# Patient Record
Sex: Male | Born: 1940
Health system: Southern US, Community
[De-identification: ages and names within clinical notes are randomized; demographics above are authoritative.]

## PROBLEM LIST (undated history)

## (undated) DIAGNOSIS — H269 Unspecified cataract: Secondary | ICD-10-CM

## (undated) DIAGNOSIS — F32A Depression, unspecified: Secondary | ICD-10-CM

## (undated) DIAGNOSIS — H409 Unspecified glaucoma: Secondary | ICD-10-CM

## (undated) DIAGNOSIS — K219 Gastro-esophageal reflux disease without esophagitis: Secondary | ICD-10-CM

## (undated) DIAGNOSIS — I10 Essential (primary) hypertension: Secondary | ICD-10-CM

## (undated) DIAGNOSIS — E785 Hyperlipidemia, unspecified: Secondary | ICD-10-CM

## (undated) DIAGNOSIS — E039 Hypothyroidism, unspecified: Secondary | ICD-10-CM

## (undated) DIAGNOSIS — C679 Malignant neoplasm of bladder, unspecified: Secondary | ICD-10-CM

## (undated) DIAGNOSIS — N400 Enlarged prostate without lower urinary tract symptoms: Secondary | ICD-10-CM

## (undated) DIAGNOSIS — G43909 Migraine, unspecified, not intractable, without status migrainosus: Secondary | ICD-10-CM

## (undated) DIAGNOSIS — L989 Disorder of the skin and subcutaneous tissue, unspecified: Secondary | ICD-10-CM

## (undated) DIAGNOSIS — K08109 Complete loss of teeth, unspecified cause, unspecified class: Secondary | ICD-10-CM

## (undated) DIAGNOSIS — E119 Type 2 diabetes mellitus without complications: Secondary | ICD-10-CM

## (undated) DIAGNOSIS — R0609 Other forms of dyspnea: Secondary | ICD-10-CM

## (undated) DIAGNOSIS — H9190 Unspecified hearing loss, unspecified ear: Secondary | ICD-10-CM

## (undated) DIAGNOSIS — R0600 Dyspnea, unspecified: Secondary | ICD-10-CM

## (undated) DIAGNOSIS — Z972 Presence of dental prosthetic device (complete) (partial): Secondary | ICD-10-CM

## (undated) DIAGNOSIS — G629 Polyneuropathy, unspecified: Secondary | ICD-10-CM

## (undated) DIAGNOSIS — H5440 Blindness, one eye, unspecified eye: Secondary | ICD-10-CM

## (undated) DIAGNOSIS — Z973 Presence of spectacles and contact lenses: Secondary | ICD-10-CM

## (undated) DIAGNOSIS — R399 Unspecified symptoms and signs involving the genitourinary system: Secondary | ICD-10-CM

## (undated) HISTORY — PX: EYE SURGERY: SHX253

## (undated) HISTORY — PX: GLAUCOMA SURGERY: SHX656

## (undated) HISTORY — PX: OTHER SURGICAL HISTORY: SHX169

## (undated) HISTORY — DX: Hyperlipidemia, unspecified: E78.5

## (undated) HISTORY — PX: BACK SURGERY: SHX140

## (undated) HISTORY — PX: CHOLECYSTECTOMY: SHX55

---

## 1997-11-10 ENCOUNTER — Ambulatory Visit (HOSPITAL_COMMUNITY): Admission: RE | Admit: 1997-11-10 | Discharge: 1997-11-10 | Payer: Self-pay | Admitting: Neurosurgery

## 1997-11-24 ENCOUNTER — Ambulatory Visit (HOSPITAL_COMMUNITY): Admission: RE | Admit: 1997-11-24 | Discharge: 1997-11-24 | Payer: Self-pay | Admitting: Neurosurgery

## 1997-12-08 ENCOUNTER — Ambulatory Visit (HOSPITAL_COMMUNITY): Admission: RE | Admit: 1997-12-08 | Discharge: 1997-12-08 | Payer: Self-pay | Admitting: Neurosurgery

## 1998-02-27 ENCOUNTER — Ambulatory Visit (HOSPITAL_COMMUNITY): Admission: RE | Admit: 1998-02-27 | Discharge: 1998-02-27 | Payer: Self-pay | Admitting: Neurosurgery

## 2000-06-14 ENCOUNTER — Ambulatory Visit (HOSPITAL_COMMUNITY): Admission: RE | Admit: 2000-06-14 | Discharge: 2000-06-14 | Payer: Self-pay | Admitting: Neurosurgery

## 2000-06-14 ENCOUNTER — Encounter: Payer: Self-pay | Admitting: Neurosurgery

## 2000-08-15 ENCOUNTER — Observation Stay (HOSPITAL_COMMUNITY): Admission: RE | Admit: 2000-08-15 | Discharge: 2000-08-15 | Payer: Self-pay | Admitting: Neurosurgery

## 2000-08-15 ENCOUNTER — Encounter: Payer: Self-pay | Admitting: Neurosurgery

## 2000-08-15 HISTORY — PX: ANTERIOR CERVICAL DECOMP/DISCECTOMY FUSION: SHX1161

## 2000-09-16 ENCOUNTER — Encounter: Payer: Self-pay | Admitting: Neurosurgery

## 2000-09-16 ENCOUNTER — Encounter: Admission: RE | Admit: 2000-09-16 | Discharge: 2000-09-16 | Payer: Self-pay | Admitting: Neurosurgery

## 2000-10-28 ENCOUNTER — Encounter: Admission: RE | Admit: 2000-10-28 | Discharge: 2000-10-28 | Payer: Self-pay | Admitting: Neurosurgery

## 2000-10-28 ENCOUNTER — Encounter: Payer: Self-pay | Admitting: Neurosurgery

## 2003-07-19 ENCOUNTER — Emergency Department (HOSPITAL_COMMUNITY): Admission: AD | Admit: 2003-07-19 | Discharge: 2003-07-19 | Payer: Self-pay | Admitting: Family Medicine

## 2008-02-08 ENCOUNTER — Encounter: Admission: RE | Admit: 2008-02-08 | Discharge: 2008-02-08 | Payer: Self-pay | Admitting: Otolaryngology

## 2008-03-04 ENCOUNTER — Ambulatory Visit (HOSPITAL_COMMUNITY): Admission: RE | Admit: 2008-03-04 | Discharge: 2008-03-05 | Payer: Self-pay | Admitting: Neurosurgery

## 2008-03-04 HISTORY — PX: ANTERIOR CERVICAL DECOMP/DISCECTOMY FUSION: SHX1161

## 2010-12-25 NOTE — Op Note (Signed)
Keith [name], Keith [name]              ACCOUNT NO.:  [id number]   MEDICAL RECORD NO.:  [id number]          PATIENT TYPE:  AMB   LOCATION:  SDS                          FACILITY:  [facility]   PHYSICIAN:  Coletta Memos, M.D.     DATE OF BIRTH:  05/01/1941   DATE OF PROCEDURE:  03/04/2008  DATE OF DISCHARGE:                               OPERATIVE REPORT   PREOPERATIVE DIAGNOSIS:  Displaced disk, left C6-C7.   POSTOPERATIVE DIAGNOSIS:  Displaced disk, left C6-C7.   PROCEDURES:  1. Anterior cervical decompression, C6-C7.  2. Arthrodesis, C6-C7 with 7-mm ACF bone.  3. Anterior instrumentation, C6-C7, 20-mm Vector plate with 16-XW      screws.  4. Removal of tethered plate, broken screw left in the C6 bone covered      over by the new plate.   COMPLICATIONS:  None.   SURGEON:  Coletta Memos, MD   ASSISTANT:  Hilda Lias, MD   ANESTHESIA:  General endotracheal.   INDICATIONS:  Keith [name] is a long-time patient of mine having had his  first operation of the C5-C6 for herniated disk in 2002.  I had followed  him until about 2 years ago, again he is still being doing well.  He  came back in with acute history of pain in his neck and left upper  extremity.  MRI done this week showed a herniated disk on the left side  at C6-C7.  I, therefore, recommended and he agreed to undergo operative  decompression at C6-C7.   OPERATIVE NOTE:  Keith [name] was brought to the operating room,  intubated, and placed under general anesthetic.  His head was placed on  a horseshoe headrest in slight extension.  His neck was prepped and he  was draped in a sterile fashion.  I opened his old incision after  infiltrating 0.5% lidocaine.  I did this with a #10 blade and took this  initial cut down to the platysma.  I opened the platysma in a horizontal  fashion using Metzenbaum scissors.  I was able to dissect cleanly to the  anterior cervical fascia.  The esophagus was not particularly padded.  I  was able  to appreciate the old plate.  I then used curettes and  monopolar cautery to remove soft tissues from around the plate.  I then  removed the plate and the screws.  The bottom right screw at C6 was  broken.  Looking back at the MRI, it appeared that, that screw may have  been broken already.  Nevertheless, I was then able to again access of  the disk space.  Some of the bone had overgrown in the disk space, but I  was able to get underneath that with a Kerrison punch and remove that.   I prepared for the diskectomy first by using a #10 blade and opening the  disk space.  I then used curettes, Kerrison punches, pituitary rongeurs,  and a high-speed drill to fully open the disk space and removed the disk  material and soft tissue.  I removed over a large piece of the disk from  the left side at C6-C7.  I was able to then fully decompress the spinal  canal and the C7 nerve roots at that level.  Having done that, I then  prepared for arthrodesis.  I evened out the C6 and C7 vertebral bodies  to accept the bone graft.  I sized the space and felt that a 7-mm graft  would be a best fit.  I placed a 7-mm graft without difficulty.  Bleeding was controlled with ease.  Bleeding was controlled with  irrigation and Gelfoam.  I was then able to place a 20-mm plate.  This  was done so that it would cover the hole where the broken screw was.  It  was well within the bone and a solid fusion had been obtained at C5-C6.  Four screws were placed into the C5-C6 complex and two into C7.  X-rays  were taken which showed that the plate was not at C5 and it was obvious  that I had been at the correct the position.  I then irrigated.  Again  with Dr. Cassandria Santee assistance as I had with placing the plate, closed the  wound in layered fashion using Vicryl sutures to reapproximate the  platysma and subcuticular layers.  I used Dermabond formal sterile  dressing.  He tolerated the procedure well and moving all  extremities.           ______________________________  Coletta Memos, M.D.     KC/MEDQ  D:  03/04/2008  T:  03/05/2008  Job:  [id number]

## 2010-12-28 NOTE — H&P (Signed)
[place]  Patient:    Keith [name], Keith [name]                   MRN: [id number] Adm. Date:  08/15/00 Attending:  Ronaldo Miyamoto L. Franky Macho, M.D.                         History and Physical  ADMISSION DIAGNOSES:  C5-6 displaced disk, with bilateral C6 radiculopathy and spondylosis of the cervical spine.  HISTORY:  The patient initially presented to my office in March 1999, for the evaluation of low back pain.  He had improved until October 2001, when he first noted some pain in his left shoulder and in his neck.  The pain was described as a burning sensation at the base of his neck.  He had some pain which radiated into the left shoulder.  If he flexes his arm and externally rotates it, putting his hand behind his head, then the pain will decrease.  I felt that was typical of his displaced disk in the cervical spine, so I sent him for plain x-rays and an MRI of the cervical spine.  The MRI showed that he had a displaced disk on the right side at C5-6 and bilateral foraminal stenosis, along with a very spondylitic level seen on plain films.  We did try conservative therapy, and he never had neurologic deficits, but the pain did not improve, and in December he and I decided that he should go ahead with an anterior cervical diskectomy and arthrodesis.  PAST MEDICAL HISTORY: 1. Glaucoma. 2. He had one previous operation on his eye in 1980, for glaucoma.  ALLERGIES:  No known drug allergies.  SOCIAL HISTORY:  He smokes one pack of cigarettes a day with a 40-pack-year-history.  He is 5 feet 10 inches tall, weighing 180 pounds.  CURRENT MEDICATIONS: 1. Synthroid 100 mcg q.d. 2. Vioxx 25 mg q.d. 3. Occasional hydrocodone. 4. Serevent inhaler p.r.n.  FAMILY HISTORY:  Mother and father are both deceased.  There are no medical problems in the family.  PHYSICAL EXAMINATION:  GENERAL:  He is alert and oriented by 4, and answering all  questions appropriately.  NEUROLOGIC:  Cranial nerves were normal.  Memory, language, and attention span were normal.  He has 5/5 strength in the upper and lower extremities.  Normal muscle tone, bulk, and coordination.  Proprioception normal.  Pinprick normal. No Hoffmans sign.  No clonus.  NECK:  He had no cervical masses or bruits.  HEART:  Regular rhythm and rate.  ABDOMEN:  Soft, nontender.  Bowel sounds present.  No masses.  EXTREMITIES:  Pulses good at the wrists and feet bilaterally.  An MRI shows a displaced disk at C5-6, with spurring both anteriorly and posteriorly.  It is also seen on the plain films.  Neural foraminal stenosis evident on the plain films.  The patient has pain in the neck and left upper extremity, secondary to bilateral foraminal stenosis and one bad degenerated level at C5-6.  I have recommended, and he has agreed to undergo an anterior cervical diskectomy and fusion.  The risks of the procedure, including bleeding, infection, no pain relief, bowel and bladder dysfunction, worsening of the pain, paralysis, upper and/or lower extremity weakness, difficulty with urination and/or bowel movements, need for further operation, fusion failure, and hardware failure were explained.  He wishes to proceed. DD:  08/15/00 TD:  08/15/00 Job: [id number]

## 2010-12-28 NOTE — Op Note (Signed)
[place]  Patient:    Keith [name], Keith [name]                   MRN: [id number] Proc. Date: 08/15/00 Attending:  Ronaldo Miyamoto L. Franky Macho, M.D.                           Operative Report  PREOPERATIVE DIAGNOSIS:  Displaced disk C5-6.  POSTOPERATIVE DIAGNOSIS:  Displaced disk C5-6.  OPERATION PERFORMED:  Anterior cervical diskectomy and arthrodesis C5-6 with 18 mm tethered plate, 8 mm allograft and four 13 mm variable angled screws.  COMPLICATIONS:  None.  SURGEON:  Kyle L. Franky Macho, M.D.  ASSISTANT:  Hewitt Shorts, M.D.  ANESTHESIA:  General endotracheal.  INDICATIONS FOR PROCEDURE:  The patient is a gentleman whom I have followed for a long period of time for low back pain.  He was doing well until October of 2001 when at that time he started complaining of neck and left shoulder pain.  MRI showed a disk herniation at C5-6 with both spondylitic spurs and disk.  I recommended and he agreed to undergo an anterior cervical diskectomy and fusion.  DESCRIPTION OF PROCEDURE:  The patient was brought to the operating room, intubated and placed under general anesthesia without difficulty.  His neck was prepped and I infiltrated 0.5% lidocaine 1:200,000 strength epinephrine into my proposed incision site.  He was draped in sterile fashion.  I made a skin incision starting from the midline of the neck to the medial border of the left sternocleidomastoid with a #10 blade and took this down to the platysma.  I controlled bleeding in the subcutaneous with bipolar cautery.  I opened the platysma in a horizontal fashion with Metzenbaum scissors.  I dissected both rostrally and caudally, superior and inferior to the platysma. I was able then to gain the crease between the sternocleidomastoid and the medial strap muscles.  Using a hand held retractor, the medial structures were retracted medially.  I developed a plane using a Kitner between the two to the  anterior cervical spine, removed soft tissue from the front and thus placed a spinal needle for localization.  He had a large spur anteriorly and it felt that this would be the correct level and it was as seen on x-ray.  I opened the disk space with a #15 blade to mark it and removed a small amount of disk material.  I then reflected the longus colli muscles laterally and placed a self-retaining Caspar retractor.  I placed two distraction pins, one in C5, the other in C6.  I then opened the disk space and then distracted the disk space.  Using a 15 blade and using straight curets, I removed much of the soft tissue and the end plate at C5 and 6.  I was able to remove enough of the soft tissue to get the Kerrison punch underneath the posterior longitudinal ligament.  The microscope was then brought into the operative site and I used that to further remove bone and disk.  I used a drill also to remove some bone spurs and shave down the spondylosis which was encroaching upon the neural foramina.  Using the Kerrison punches I was able to decompress both of the C6 nerve roots on the left and right side to my satisfaction.  There was no pressure appreciated on either nerve root.  I irrigated the wound.  Placed some Gelfoam for controlling some of  the epidural bleeding.  I then placed an 8 mm graft into the disk space after drilling down the end plates and smoothing them out and taking off some more spurs with a Leksell.  I placed a 18 mm tethered plate and used four 13 mm variable angle screws in all.  Two screws placed at C5, two screws placed at C6.  I irrigated the wound well. The weight was taken off the neck.  I took another x-ray showed the plate to be in good position as was the bone plug.  I then closed the wound in layered fashion reapproximating the platysma and then the subcutaneous tissues. Steri-Strips were used on the skin and a sterile dressing was applied.  The patient was extubated  moving all extremities well. DD:  08/15/00 TD:  08/15/00 Job: [id number]

## 2011-02-18 ENCOUNTER — Encounter (HOSPITAL_COMMUNITY): Payer: Self-pay

## 2011-02-18 ENCOUNTER — Emergency Department (HOSPITAL_COMMUNITY): Payer: Medicare Other

## 2011-02-18 ENCOUNTER — Emergency Department (HOSPITAL_COMMUNITY)
Admission: EM | Admit: 2011-02-18 | Discharge: 2011-02-18 | Disposition: A | Discharge status: Home / Self Care | Payer: Medicare Other | Attending: Emergency Medicine | Admitting: Emergency Medicine

## 2011-02-18 DIAGNOSIS — E7800 Pure hypercholesterolemia, unspecified: Secondary | ICD-10-CM | POA: Insufficient documentation

## 2011-02-18 DIAGNOSIS — R509 Fever, unspecified: Secondary | ICD-10-CM | POA: Insufficient documentation

## 2011-02-18 DIAGNOSIS — E039 Hypothyroidism, unspecified: Secondary | ICD-10-CM | POA: Insufficient documentation

## 2011-02-18 DIAGNOSIS — J189 Pneumonia, unspecified organism: Secondary | ICD-10-CM | POA: Insufficient documentation

## 2011-05-10 LAB — CBC
HCT: 40.9
MCV: 93.8
RBC: 4.37
WBC: 6.5

## 2011-09-17 DIAGNOSIS — E0789 Other specified disorders of thyroid: Secondary | ICD-10-CM | POA: Diagnosis not present

## 2011-09-17 DIAGNOSIS — E785 Hyperlipidemia, unspecified: Secondary | ICD-10-CM | POA: Diagnosis not present

## 2011-09-17 DIAGNOSIS — Z125 Encounter for screening for malignant neoplasm of prostate: Secondary | ICD-10-CM | POA: Diagnosis not present

## 2011-09-17 DIAGNOSIS — Z79899 Other long term (current) drug therapy: Secondary | ICD-10-CM | POA: Diagnosis not present

## 2011-09-24 DIAGNOSIS — N400 Enlarged prostate without lower urinary tract symptoms: Secondary | ICD-10-CM | POA: Diagnosis not present

## 2011-09-24 DIAGNOSIS — E789 Disorder of lipoprotein metabolism, unspecified: Secondary | ICD-10-CM | POA: Diagnosis not present

## 2011-09-24 DIAGNOSIS — M169 Osteoarthritis of hip, unspecified: Secondary | ICD-10-CM | POA: Diagnosis not present

## 2011-09-24 DIAGNOSIS — F17200 Nicotine dependence, unspecified, uncomplicated: Secondary | ICD-10-CM | POA: Diagnosis not present

## 2011-09-24 DIAGNOSIS — M25559 Pain in unspecified hip: Secondary | ICD-10-CM | POA: Diagnosis not present

## 2011-09-24 DIAGNOSIS — E0789 Other specified disorders of thyroid: Secondary | ICD-10-CM | POA: Diagnosis not present

## 2011-09-26 DIAGNOSIS — Z0000 Encounter for general adult medical examination without abnormal findings: Secondary | ICD-10-CM | POA: Diagnosis not present

## 2011-10-16 DIAGNOSIS — Z79899 Other long term (current) drug therapy: Secondary | ICD-10-CM | POA: Diagnosis not present

## 2011-10-23 DIAGNOSIS — K219 Gastro-esophageal reflux disease without esophagitis: Secondary | ICD-10-CM | POA: Diagnosis not present

## 2011-10-23 DIAGNOSIS — Z1211 Encounter for screening for malignant neoplasm of colon: Secondary | ICD-10-CM | POA: Diagnosis not present

## 2011-10-23 DIAGNOSIS — R1310 Dysphagia, unspecified: Secondary | ICD-10-CM | POA: Diagnosis not present

## 2011-11-05 DIAGNOSIS — K649 Unspecified hemorrhoids: Secondary | ICD-10-CM | POA: Diagnosis not present

## 2011-11-05 DIAGNOSIS — Z1211 Encounter for screening for malignant neoplasm of colon: Secondary | ICD-10-CM | POA: Diagnosis not present

## 2011-11-05 DIAGNOSIS — K219 Gastro-esophageal reflux disease without esophagitis: Secondary | ICD-10-CM | POA: Diagnosis not present

## 2011-11-05 DIAGNOSIS — D126 Benign neoplasm of colon, unspecified: Secondary | ICD-10-CM | POA: Diagnosis not present

## 2011-11-05 DIAGNOSIS — K5730 Diverticulosis of large intestine without perforation or abscess without bleeding: Secondary | ICD-10-CM | POA: Diagnosis not present

## 2011-11-05 DIAGNOSIS — R1310 Dysphagia, unspecified: Secondary | ICD-10-CM | POA: Diagnosis not present

## 2011-11-13 DIAGNOSIS — H4011X0 Primary open-angle glaucoma, stage unspecified: Secondary | ICD-10-CM | POA: Diagnosis not present

## 2011-12-04 DIAGNOSIS — K219 Gastro-esophageal reflux disease without esophagitis: Secondary | ICD-10-CM | POA: Diagnosis not present

## 2011-12-04 DIAGNOSIS — Z86010 Personal history of colonic polyps: Secondary | ICD-10-CM | POA: Diagnosis not present

## 2011-12-04 DIAGNOSIS — R1310 Dysphagia, unspecified: Secondary | ICD-10-CM | POA: Diagnosis not present

## 2012-01-24 DIAGNOSIS — E789 Disorder of lipoprotein metabolism, unspecified: Secondary | ICD-10-CM | POA: Diagnosis not present

## 2012-01-24 DIAGNOSIS — R05 Cough: Secondary | ICD-10-CM | POA: Diagnosis not present

## 2012-01-24 DIAGNOSIS — R059 Cough, unspecified: Secondary | ICD-10-CM | POA: Diagnosis not present

## 2012-01-24 DIAGNOSIS — E0789 Other specified disorders of thyroid: Secondary | ICD-10-CM | POA: Diagnosis not present

## 2012-05-14 DIAGNOSIS — H4011X0 Primary open-angle glaucoma, stage unspecified: Secondary | ICD-10-CM | POA: Diagnosis not present

## 2012-05-20 DIAGNOSIS — E789 Disorder of lipoprotein metabolism, unspecified: Secondary | ICD-10-CM | POA: Diagnosis not present

## 2012-05-27 DIAGNOSIS — E0789 Other specified disorders of thyroid: Secondary | ICD-10-CM | POA: Diagnosis not present

## 2012-05-27 DIAGNOSIS — E789 Disorder of lipoprotein metabolism, unspecified: Secondary | ICD-10-CM | POA: Diagnosis not present

## 2012-05-27 DIAGNOSIS — R05 Cough: Secondary | ICD-10-CM | POA: Diagnosis not present

## 2012-05-27 DIAGNOSIS — Z23 Encounter for immunization: Secondary | ICD-10-CM | POA: Diagnosis not present

## 2012-05-27 DIAGNOSIS — R059 Cough, unspecified: Secondary | ICD-10-CM | POA: Diagnosis not present

## 2012-09-21 DIAGNOSIS — E0789 Other specified disorders of thyroid: Secondary | ICD-10-CM | POA: Diagnosis not present

## 2012-09-21 DIAGNOSIS — Z125 Encounter for screening for malignant neoplasm of prostate: Secondary | ICD-10-CM | POA: Diagnosis not present

## 2012-09-21 DIAGNOSIS — E789 Disorder of lipoprotein metabolism, unspecified: Secondary | ICD-10-CM | POA: Diagnosis not present

## 2012-09-21 DIAGNOSIS — Z79899 Other long term (current) drug therapy: Secondary | ICD-10-CM | POA: Diagnosis not present

## 2012-09-28 DIAGNOSIS — R0609 Other forms of dyspnea: Secondary | ICD-10-CM | POA: Diagnosis not present

## 2012-09-28 DIAGNOSIS — R0989 Other specified symptoms and signs involving the circulatory and respiratory systems: Secondary | ICD-10-CM | POA: Diagnosis not present

## 2012-09-28 DIAGNOSIS — R05 Cough: Secondary | ICD-10-CM | POA: Diagnosis not present

## 2012-09-28 DIAGNOSIS — E789 Disorder of lipoprotein metabolism, unspecified: Secondary | ICD-10-CM | POA: Diagnosis not present

## 2012-09-28 DIAGNOSIS — E0789 Other specified disorders of thyroid: Secondary | ICD-10-CM | POA: Diagnosis not present

## 2012-09-28 DIAGNOSIS — R059 Cough, unspecified: Secondary | ICD-10-CM | POA: Diagnosis not present

## 2012-11-12 DIAGNOSIS — H524 Presbyopia: Secondary | ICD-10-CM | POA: Diagnosis not present

## 2012-11-12 DIAGNOSIS — H4011X0 Primary open-angle glaucoma, stage unspecified: Secondary | ICD-10-CM | POA: Diagnosis not present

## 2012-11-23 DIAGNOSIS — E0789 Other specified disorders of thyroid: Secondary | ICD-10-CM | POA: Diagnosis not present

## 2012-11-23 DIAGNOSIS — IMO0002 Reserved for concepts with insufficient information to code with codable children: Secondary | ICD-10-CM | POA: Diagnosis not present

## 2012-11-23 DIAGNOSIS — M47817 Spondylosis without myelopathy or radiculopathy, lumbosacral region: Secondary | ICD-10-CM | POA: Diagnosis not present

## 2012-11-23 DIAGNOSIS — E789 Disorder of lipoprotein metabolism, unspecified: Secondary | ICD-10-CM | POA: Diagnosis not present

## 2012-12-07 DIAGNOSIS — M545 Low back pain, unspecified: Secondary | ICD-10-CM | POA: Diagnosis not present

## 2012-12-08 ENCOUNTER — Other Ambulatory Visit: Payer: Self-pay | Admitting: Neurosurgery

## 2012-12-08 DIAGNOSIS — M545 Low back pain, unspecified: Secondary | ICD-10-CM

## 2012-12-11 ENCOUNTER — Ambulatory Visit
Admission: RE | Admit: 2012-12-11 | Discharge: 2012-12-11 | Disposition: A | Discharge status: Home / Self Care | Payer: Medicare Other | Source: Ambulatory Visit | Attending: Neurosurgery | Admitting: Neurosurgery

## 2012-12-11 ENCOUNTER — Other Ambulatory Visit: Payer: Self-pay | Admitting: Neurosurgery

## 2012-12-11 DIAGNOSIS — R209 Unspecified disturbances of skin sensation: Secondary | ICD-10-CM | POA: Diagnosis not present

## 2012-12-11 DIAGNOSIS — M5126 Other intervertebral disc displacement, lumbar region: Secondary | ICD-10-CM | POA: Diagnosis not present

## 2012-12-11 DIAGNOSIS — M545 Low back pain: Secondary | ICD-10-CM

## 2012-12-15 DIAGNOSIS — M5126 Other intervertebral disc displacement, lumbar region: Secondary | ICD-10-CM | POA: Diagnosis not present

## 2013-01-25 DIAGNOSIS — M5126 Other intervertebral disc displacement, lumbar region: Secondary | ICD-10-CM | POA: Diagnosis not present

## 2013-03-31 DIAGNOSIS — T1500XA Foreign body in cornea, unspecified eye, initial encounter: Secondary | ICD-10-CM | POA: Diagnosis not present

## 2013-03-31 DIAGNOSIS — H5710 Ocular pain, unspecified eye: Secondary | ICD-10-CM | POA: Diagnosis not present

## 2013-05-25 DIAGNOSIS — Z23 Encounter for immunization: Secondary | ICD-10-CM | POA: Diagnosis not present

## 2013-09-06 DIAGNOSIS — H2589 Other age-related cataract: Secondary | ICD-10-CM | POA: Diagnosis not present

## 2013-09-29 DIAGNOSIS — Z79899 Other long term (current) drug therapy: Secondary | ICD-10-CM | POA: Diagnosis not present

## 2013-09-29 DIAGNOSIS — Z125 Encounter for screening for malignant neoplasm of prostate: Secondary | ICD-10-CM | POA: Diagnosis not present

## 2013-09-29 DIAGNOSIS — E789 Disorder of lipoprotein metabolism, unspecified: Secondary | ICD-10-CM | POA: Diagnosis not present

## 2013-09-29 DIAGNOSIS — E0789 Other specified disorders of thyroid: Secondary | ICD-10-CM | POA: Diagnosis not present

## 2013-10-06 DIAGNOSIS — N400 Enlarged prostate without lower urinary tract symptoms: Secondary | ICD-10-CM | POA: Diagnosis not present

## 2013-10-06 DIAGNOSIS — E0789 Other specified disorders of thyroid: Secondary | ICD-10-CM | POA: Diagnosis not present

## 2013-10-06 DIAGNOSIS — R05 Cough: Secondary | ICD-10-CM | POA: Diagnosis not present

## 2013-10-06 DIAGNOSIS — E789 Disorder of lipoprotein metabolism, unspecified: Secondary | ICD-10-CM | POA: Diagnosis not present

## 2013-10-06 DIAGNOSIS — R059 Cough, unspecified: Secondary | ICD-10-CM | POA: Diagnosis not present

## 2013-11-16 DIAGNOSIS — R030 Elevated blood-pressure reading, without diagnosis of hypertension: Secondary | ICD-10-CM | POA: Diagnosis not present

## 2013-11-16 DIAGNOSIS — IMO0002 Reserved for concepts with insufficient information to code with codable children: Secondary | ICD-10-CM | POA: Diagnosis not present

## 2013-11-16 DIAGNOSIS — M5126 Other intervertebral disc displacement, lumbar region: Secondary | ICD-10-CM | POA: Diagnosis not present

## 2013-11-16 DIAGNOSIS — Z6829 Body mass index (BMI) 29.0-29.9, adult: Secondary | ICD-10-CM | POA: Diagnosis not present

## 2013-11-20 DIAGNOSIS — M5126 Other intervertebral disc displacement, lumbar region: Secondary | ICD-10-CM | POA: Diagnosis not present

## 2013-11-23 DIAGNOSIS — IMO0002 Reserved for concepts with insufficient information to code with codable children: Secondary | ICD-10-CM | POA: Diagnosis not present

## 2013-11-23 DIAGNOSIS — R030 Elevated blood-pressure reading, without diagnosis of hypertension: Secondary | ICD-10-CM | POA: Diagnosis not present

## 2013-11-23 DIAGNOSIS — Z6828 Body mass index (BMI) 28.0-28.9, adult: Secondary | ICD-10-CM | POA: Diagnosis not present

## 2014-01-27 DIAGNOSIS — E789 Disorder of lipoprotein metabolism, unspecified: Secondary | ICD-10-CM | POA: Diagnosis not present

## 2014-02-03 DIAGNOSIS — M25559 Pain in unspecified hip: Secondary | ICD-10-CM | POA: Diagnosis not present

## 2014-02-03 DIAGNOSIS — E0789 Other specified disorders of thyroid: Secondary | ICD-10-CM | POA: Diagnosis not present

## 2014-02-03 DIAGNOSIS — R0609 Other forms of dyspnea: Secondary | ICD-10-CM | POA: Diagnosis not present

## 2014-02-03 DIAGNOSIS — E789 Disorder of lipoprotein metabolism, unspecified: Secondary | ICD-10-CM | POA: Diagnosis not present

## 2014-02-14 IMAGING — CR DG LUMBAR SPINE 2-3V
3 series · 3 of 3 positions shown · non-contrast
Comparison: Lumbar spine x-rays 11/13/2012.  MRI of the lumbar
spine obtained same date.

CLINICAL DATA: Right lower extremity numbness.  No known injuries.

LUMBAR SPINE - 2-3 VIEW

[view not recorded (1 of 3)]
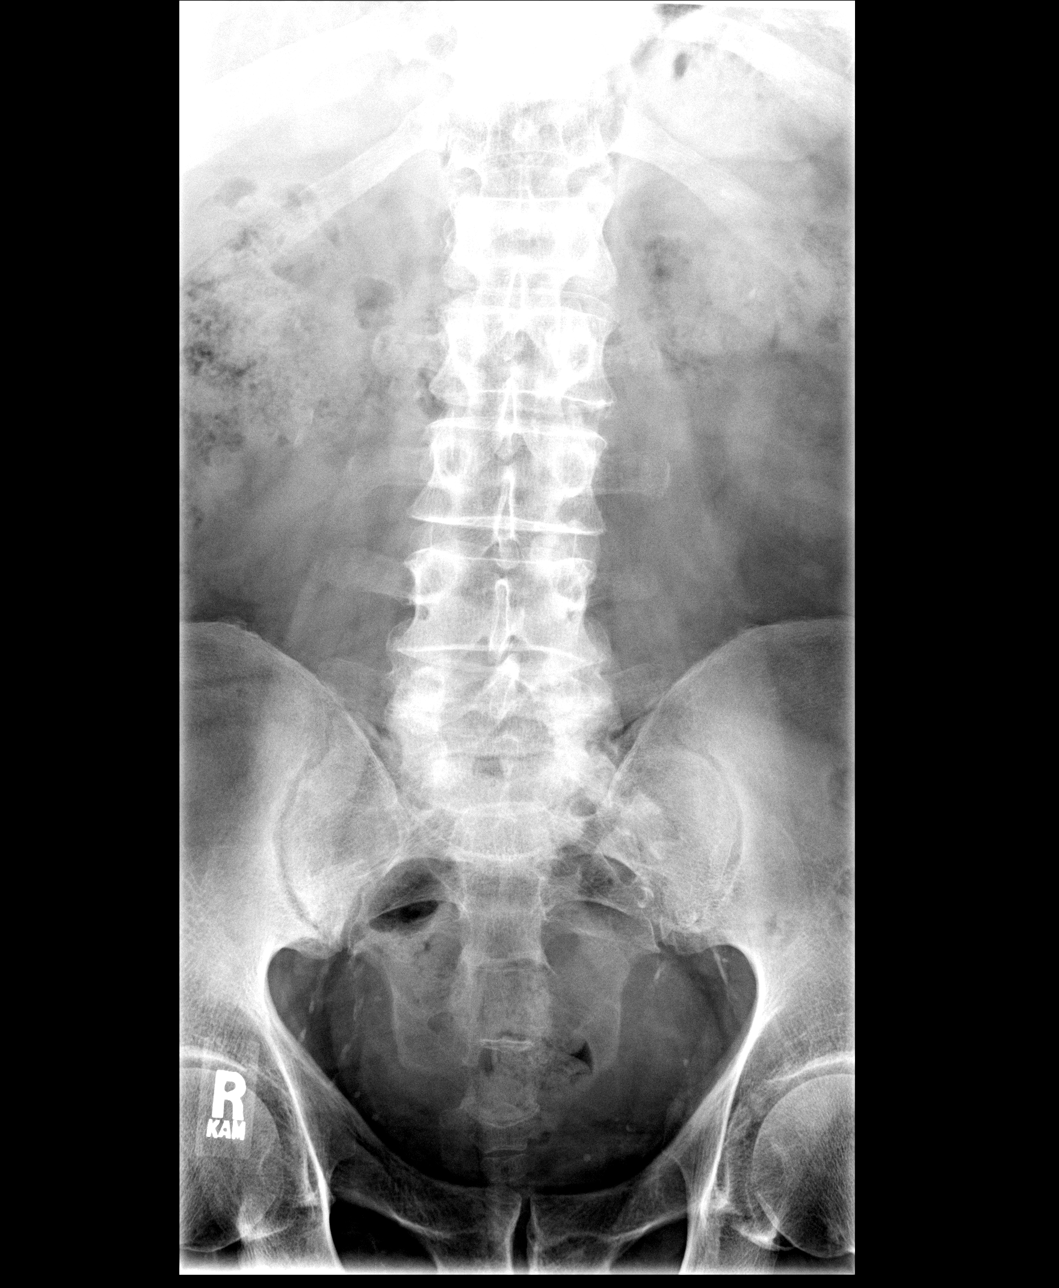

[view not recorded (2 of 3)]
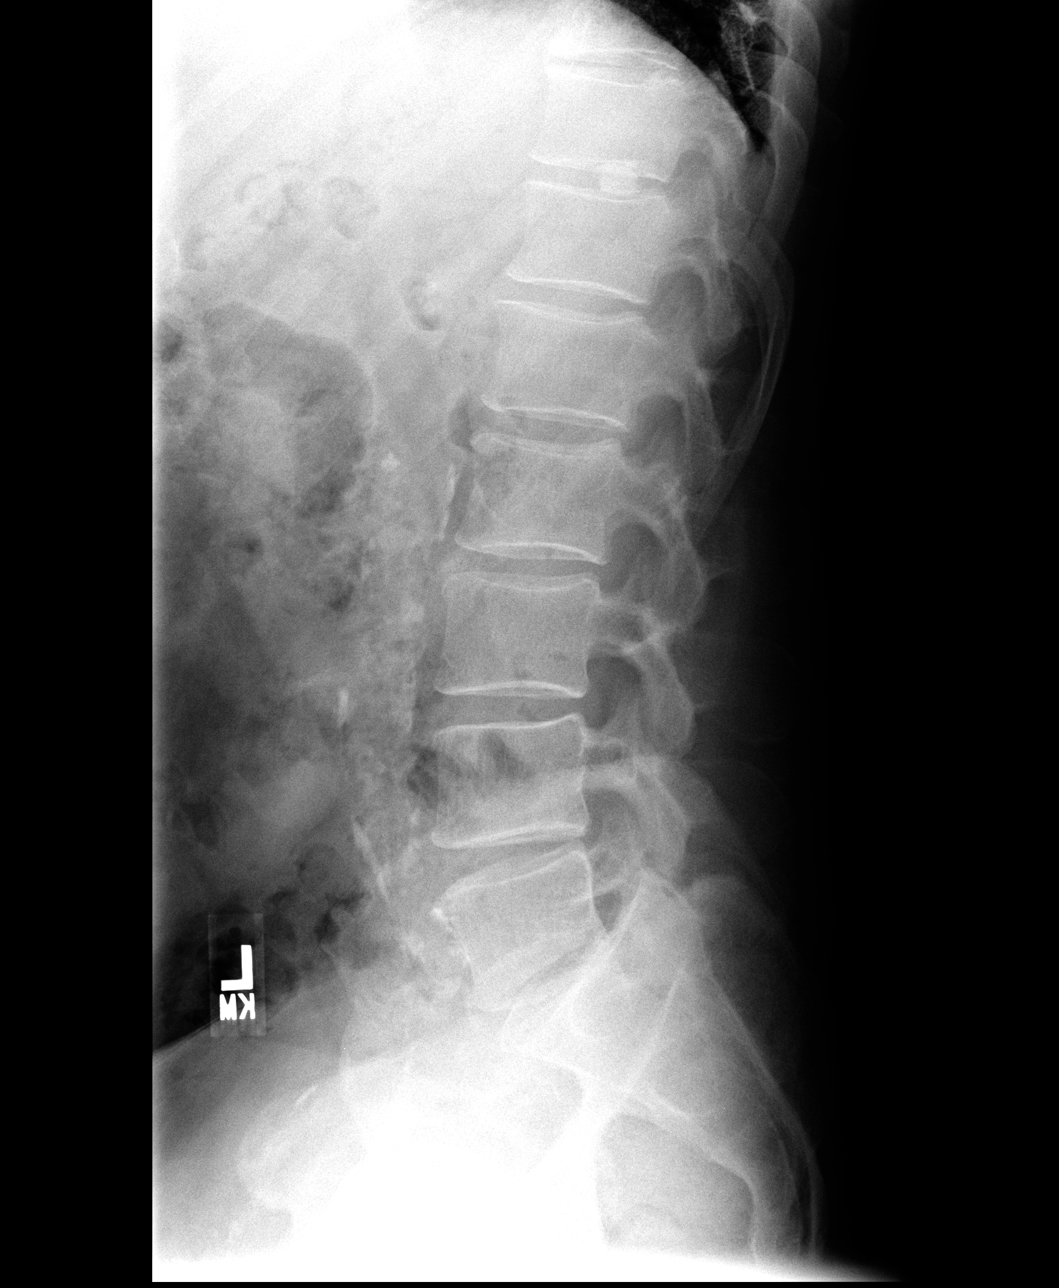

[view not recorded (3 of 3)]
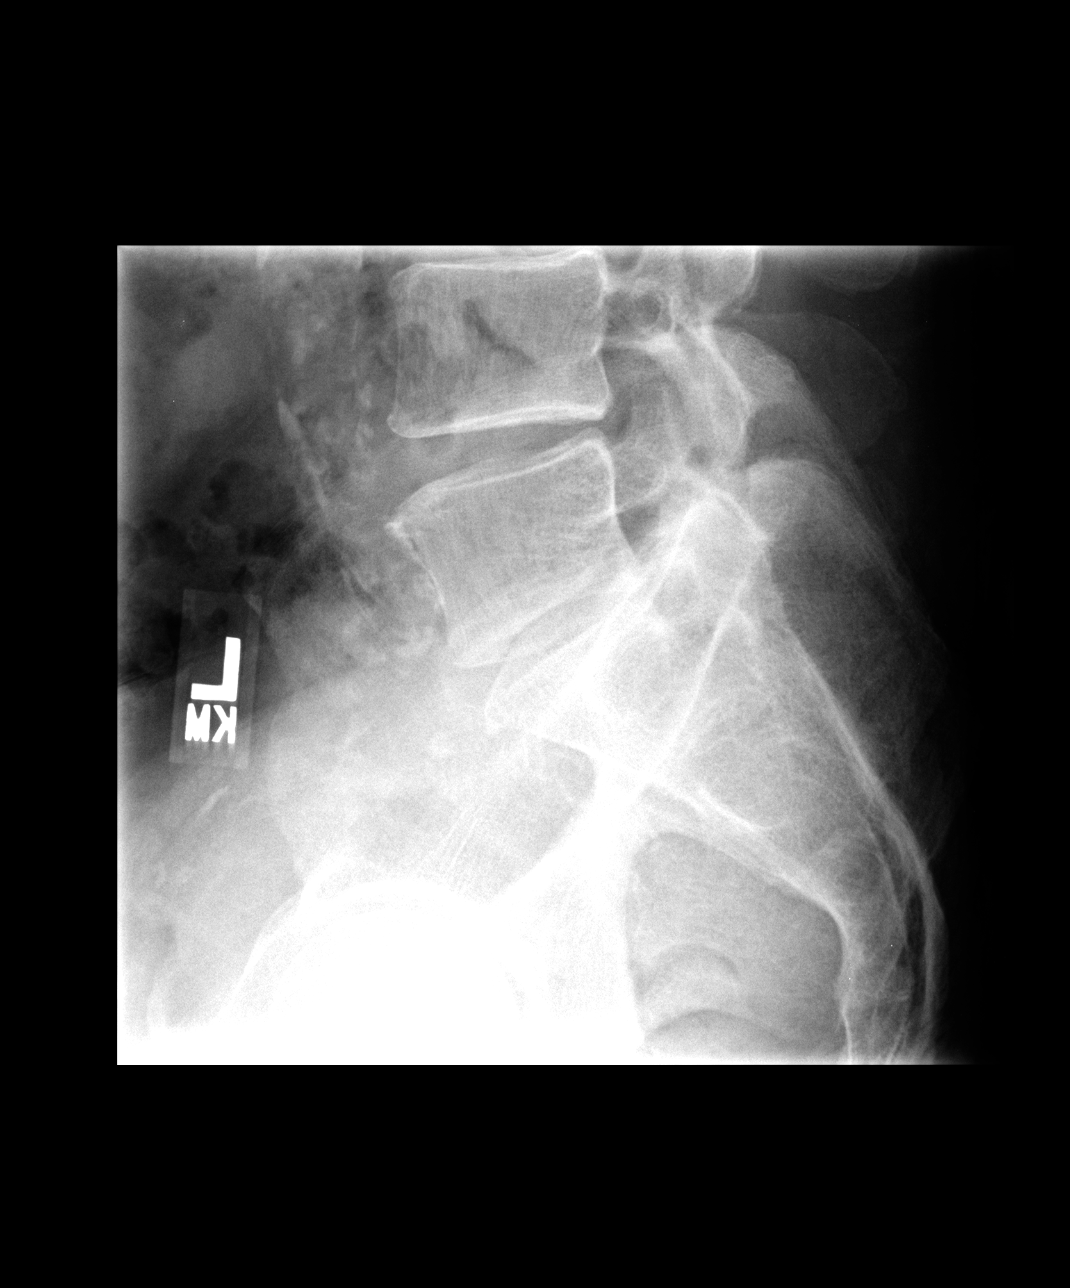

[3 of 3 positions shown; findings below may reference images not displayed]

FINDINGS: Five non-rib bearing lumbar vertebrae with anatomic
alignment.  No fractures.  Mild disc space narrowing at L3-4.
Remaining disc spaces well preserved.  Calcification within the T11-
12 disc.  No visible posterior element hypertrophy.  Aorto-
iliofemoral calcified atherosclerotic plaque without evidence of
aneurysm.
IMPRESSION: Five non-rib bearing lumbar vertebrae.  Mild disc space narrowing
L3-4.  Calcification within the T11-12 disc.

## 2014-05-24 DIAGNOSIS — G245 Blepharospasm: Secondary | ICD-10-CM | POA: Diagnosis not present

## 2014-05-24 DIAGNOSIS — H4011X1 Primary open-angle glaucoma, mild stage: Secondary | ICD-10-CM | POA: Diagnosis not present

## 2014-05-24 DIAGNOSIS — H2512 Age-related nuclear cataract, left eye: Secondary | ICD-10-CM | POA: Diagnosis not present

## 2014-06-02 DIAGNOSIS — E0789 Other specified disorders of thyroid: Secondary | ICD-10-CM | POA: Diagnosis not present

## 2014-06-02 DIAGNOSIS — E7889 Other lipoprotein metabolism disorders: Secondary | ICD-10-CM | POA: Diagnosis not present

## 2014-06-07 DIAGNOSIS — Z23 Encounter for immunization: Secondary | ICD-10-CM | POA: Diagnosis not present

## 2014-06-09 DIAGNOSIS — E039 Hypothyroidism, unspecified: Secondary | ICD-10-CM | POA: Diagnosis not present

## 2014-06-09 DIAGNOSIS — E789 Disorder of lipoprotein metabolism, unspecified: Secondary | ICD-10-CM | POA: Diagnosis not present

## 2014-06-09 DIAGNOSIS — N3281 Overactive bladder: Secondary | ICD-10-CM | POA: Diagnosis not present

## 2014-06-27 ENCOUNTER — Encounter: Payer: Self-pay | Admitting: Surgery

## 2014-06-27 ENCOUNTER — Other Ambulatory Visit: Payer: Self-pay | Admitting: *Deleted

## 2014-06-27 DIAGNOSIS — I70213 Atherosclerosis of native arteries of extremities with intermittent claudication, bilateral legs: Secondary | ICD-10-CM

## 2014-07-18 ENCOUNTER — Encounter: Payer: Medicare Other | Admitting: Surgery

## 2014-07-18 ENCOUNTER — Encounter (HOSPITAL_COMMUNITY): Payer: Medicare Other

## 2014-07-19 ENCOUNTER — Encounter: Payer: Self-pay | Admitting: Surgery

## 2014-07-20 ENCOUNTER — Ambulatory Visit (INDEPENDENT_AMBULATORY_CARE_PROVIDER_SITE_OTHER): Payer: Medicare Other | Admitting: Surgery

## 2014-07-20 ENCOUNTER — Encounter: Payer: Self-pay | Admitting: Surgery

## 2014-07-20 ENCOUNTER — Ambulatory Visit (HOSPITAL_COMMUNITY)
Admission: RE | Admit: 2014-07-20 | Discharge: 2014-07-20 | Disposition: A | Discharge status: Home / Self Care | Payer: Medicare Other | Source: Ambulatory Visit | Attending: Surgery | Admitting: Surgery

## 2014-07-20 VITALS — BP 159/78 | HR 66 | Resp 16 | Ht 70.0 in | Wt 198.3 lb

## 2014-07-20 DIAGNOSIS — I70213 Atherosclerosis of native arteries of extremities with intermittent claudication, bilateral legs: Secondary | ICD-10-CM

## 2014-07-20 DIAGNOSIS — M25569 Pain in unspecified knee: Secondary | ICD-10-CM

## 2014-07-20 NOTE — Progress Notes (Signed)
Patient name: Keith [name] MRN: [id number] DOB: [birth year] Sex: male   Referred by: Dr. Wilson Singer  Reason for referral:  Chief Complaint  Patient presents with  . New Evaluation    bilateral hip pain and numbness in right leg  referred by Dr Wilson Singer    HISTORY OF PRESENT ILLNESS: This is a 73 year old gentleman who is here today for further evaluation of bilateral hip and leg pain.  The patient states that his pain began several years ago when he fell in a hole and injured his back.  He underwent surgery on C6-C7 by Dr. Cyndy Freeze in 2009.  He also had C5-C6 surgery in 2002.  The patient states that he began having bilateral hip pain which was made worse by ambulation and improved with rest, following his fall.  He also complains of some numbness on the right side as well as in the left foot.  He takes ibuprofen for pain.  He comes in for vascular evaluation.  The patient is medically managed for hypercholesterolemia with a statin.  He is a former smoker who quit in 2008.  He is also on Synthroid for hypothyroidism.  Past Medical History  Diagnosis Date  . Hyperlipidemia   . Thyroid disease     hypothyroid    Past Surgical History  Procedure Laterality Date  . Neck surgery    . Eye surgery      History   Social History  . Marital Status: Married    Spouse Name: N/A    Number of Children: N/A  . Years of Education: N/A   Occupational History  . Not on file.   Social History Main Topics  . Smoking status: Former Smoker    Quit date: 06/28/2007  . Smokeless tobacco: Never Used  . Alcohol Use: 0.0 oz/week    0 Not specified per week  . Drug Use: No  . Sexual Activity: Not on file   Other Topics Concern  . Not on file   Social History Narrative    Family History  Problem Relation Age of Onset  . Heart disease Mother   . Heart disease Father     Allergies as of 07/20/2014 - Review Complete 07/20/2014  Allergen Reaction Noted  . Altace [ramipril]  06/27/2014      Current Outpatient Prescriptions on File Prior to Visit  Medication Sig Dispense Refill  . ibuprofen (ADVIL,MOTRIN) 600 MG tablet Take 600 mg by mouth every 6 (six) hours as needed.    Marland Kitchen levothyroxine (SYNTHROID, LEVOTHROID) 100 MCG tablet Take 100 mcg by mouth daily before breakfast.    . naproxen sodium (ANAPROX) 220 MG tablet Take 220 mg by mouth 2 (two) times daily with a meal.    . tamsulosin (FLOMAX) 0.4 MG CAPS capsule Take 0.4 mg by mouth daily.    Marland Kitchen atorvastatin (LIPITOR) 10 MG tablet Take 10 mg by mouth daily.    . meloxicam (MOBIC) 15 MG tablet Take 15 mg by mouth daily.     No current facility-administered medications on file prior to visit.     REVIEW OF SYSTEMS: Cardiovascular: No chest pain, chest pressure, palpitations, orthopnea, or dyspnea on exertion. No claudication or rest pain,  No history of DVT or phlebitis.  He does have pain in his legs when walking and when lying flat Pulmonary: No productive cough, asthma or wheezing. Neurologic: No  paresthesias, aphasia, or amaurosis. No dizziness. weakness and numbness in the legs  Hematologic: No bleeding problems or  clotting disorders. Musculoskeletal: No joint pain or joint swelling. Gastrointestinal: No blood in stool or hematemesis Genitourinary: No dysuria or hematuria. Psychiatric:: No history of major depression. Integumentary: No rashes or ulcers. Constitutional: No fever or chills.  PHYSICAL EXAMINATION: General: The patient appears their stated age.  Vital signs are BP 159/78 mmHg  Pulse 66  Resp 16  Ht 5\' 10"  (1.778 m)  Wt 198 lb 4.8 oz (89.948 kg)  BMI 28.45 kg/m2 HEENT:  No gross abnormalities Pulmonary: Respirations are non-labored Abdomen: Soft and non-tender  Musculoskeletal: There are no major deformities.   Neurologic: No focal weakness or paresthesias are detected, Skin: There are no ulcer or rashes noted. Psychiatric: The patient has normal affect. Cardiovascular: There is a regular rate  and rhythm without significant murmur appreciated. no carotid bruits.  Palpable dorsalis pedis and posterior tibial artery bilaterally.  Palpable femoral pulses.    Diagnostic Studies: ultrasound was ordered and reviewed today.  ABI on the right is 1.1 with triphasic waveforms.  ABI on the left is 1.1 with triphasic waveforms   Assessment:  bilateral leg pain   Plan: I discussed with the patient that he has palpable pedal and femoral pulses as well as a completely normal ankle-brachial index with triphasic waveforms.  These findings support that his symptoms are not related to vascular insufficiency.  I have reviewed his x-rays from June of this year as well as his MRI from a year and a half ago.  The pelvic x-rays are normal.  There were some abnormalities on the MRI of the lumbar spine.  He has seen Dr. Cyndy Freeze in the past 2 did not feel that his symptoms were related to disc disease in his back.  In light of his persistent symptoms, he may benefit from a repeat evaluation from Dr. Cyndy Freeze.  I will leave this at the discretion of Dr. Wilson Singer.  Again, the patient's symptoms are not related to vascular disease, as his vascular exam today was completely normal   V. Leia Alf, M.D. [facility] Office: [phone] Pager:  [phone]

## 2014-09-08 DIAGNOSIS — M542 Cervicalgia: Secondary | ICD-10-CM | POA: Diagnosis not present

## 2014-09-08 DIAGNOSIS — M9905 Segmental and somatic dysfunction of pelvic region: Secondary | ICD-10-CM | POA: Diagnosis not present

## 2014-09-08 DIAGNOSIS — M5417 Radiculopathy, lumbosacral region: Secondary | ICD-10-CM | POA: Diagnosis not present

## 2014-09-09 DIAGNOSIS — M25559 Pain in unspecified hip: Secondary | ICD-10-CM | POA: Diagnosis not present

## 2014-09-12 DIAGNOSIS — M542 Cervicalgia: Secondary | ICD-10-CM | POA: Diagnosis not present

## 2014-09-12 DIAGNOSIS — M5417 Radiculopathy, lumbosacral region: Secondary | ICD-10-CM | POA: Diagnosis not present

## 2014-09-12 DIAGNOSIS — M9905 Segmental and somatic dysfunction of pelvic region: Secondary | ICD-10-CM | POA: Diagnosis not present

## 2014-09-14 DIAGNOSIS — M542 Cervicalgia: Secondary | ICD-10-CM | POA: Diagnosis not present

## 2014-09-14 DIAGNOSIS — M9905 Segmental and somatic dysfunction of pelvic region: Secondary | ICD-10-CM | POA: Diagnosis not present

## 2014-09-14 DIAGNOSIS — M5417 Radiculopathy, lumbosacral region: Secondary | ICD-10-CM | POA: Diagnosis not present

## 2014-09-16 DIAGNOSIS — M5417 Radiculopathy, lumbosacral region: Secondary | ICD-10-CM | POA: Diagnosis not present

## 2014-09-16 DIAGNOSIS — M542 Cervicalgia: Secondary | ICD-10-CM | POA: Diagnosis not present

## 2014-09-16 DIAGNOSIS — M9905 Segmental and somatic dysfunction of pelvic region: Secondary | ICD-10-CM | POA: Diagnosis not present

## 2014-09-19 DIAGNOSIS — M542 Cervicalgia: Secondary | ICD-10-CM | POA: Diagnosis not present

## 2014-09-19 DIAGNOSIS — M5417 Radiculopathy, lumbosacral region: Secondary | ICD-10-CM | POA: Diagnosis not present

## 2014-09-19 DIAGNOSIS — M9905 Segmental and somatic dysfunction of pelvic region: Secondary | ICD-10-CM | POA: Diagnosis not present

## 2014-09-23 DIAGNOSIS — M542 Cervicalgia: Secondary | ICD-10-CM | POA: Diagnosis not present

## 2014-09-23 DIAGNOSIS — M9905 Segmental and somatic dysfunction of pelvic region: Secondary | ICD-10-CM | POA: Diagnosis not present

## 2014-09-23 DIAGNOSIS — M5417 Radiculopathy, lumbosacral region: Secondary | ICD-10-CM | POA: Diagnosis not present

## 2014-09-30 DIAGNOSIS — M5417 Radiculopathy, lumbosacral region: Secondary | ICD-10-CM | POA: Diagnosis not present

## 2014-09-30 DIAGNOSIS — M542 Cervicalgia: Secondary | ICD-10-CM | POA: Diagnosis not present

## 2014-09-30 DIAGNOSIS — M9905 Segmental and somatic dysfunction of pelvic region: Secondary | ICD-10-CM | POA: Diagnosis not present

## 2014-10-10 DIAGNOSIS — E789 Disorder of lipoprotein metabolism, unspecified: Secondary | ICD-10-CM | POA: Diagnosis not present

## 2014-10-10 DIAGNOSIS — Z125 Encounter for screening for malignant neoplasm of prostate: Secondary | ICD-10-CM | POA: Diagnosis not present

## 2014-10-10 DIAGNOSIS — Z0000 Encounter for general adult medical examination without abnormal findings: Secondary | ICD-10-CM | POA: Diagnosis not present

## 2014-10-10 DIAGNOSIS — M542 Cervicalgia: Secondary | ICD-10-CM | POA: Diagnosis not present

## 2014-10-10 DIAGNOSIS — M9905 Segmental and somatic dysfunction of pelvic region: Secondary | ICD-10-CM | POA: Diagnosis not present

## 2014-10-10 DIAGNOSIS — E0789 Other specified disorders of thyroid: Secondary | ICD-10-CM | POA: Diagnosis not present

## 2014-10-10 DIAGNOSIS — M5417 Radiculopathy, lumbosacral region: Secondary | ICD-10-CM | POA: Diagnosis not present

## 2014-10-17 DIAGNOSIS — E118 Type 2 diabetes mellitus with unspecified complications: Secondary | ICD-10-CM | POA: Diagnosis not present

## 2014-10-17 DIAGNOSIS — Z23 Encounter for immunization: Secondary | ICD-10-CM | POA: Diagnosis not present

## 2014-10-17 DIAGNOSIS — E032 Hypothyroidism due to medicaments and other exogenous substances: Secondary | ICD-10-CM | POA: Diagnosis not present

## 2014-10-17 DIAGNOSIS — M549 Dorsalgia, unspecified: Secondary | ICD-10-CM | POA: Diagnosis not present

## 2014-10-17 DIAGNOSIS — E789 Disorder of lipoprotein metabolism, unspecified: Secondary | ICD-10-CM | POA: Diagnosis not present

## 2014-10-24 DIAGNOSIS — M9905 Segmental and somatic dysfunction of pelvic region: Secondary | ICD-10-CM | POA: Diagnosis not present

## 2014-10-24 DIAGNOSIS — M542 Cervicalgia: Secondary | ICD-10-CM | POA: Diagnosis not present

## 2014-10-24 DIAGNOSIS — M5417 Radiculopathy, lumbosacral region: Secondary | ICD-10-CM | POA: Diagnosis not present

## 2014-11-21 DIAGNOSIS — M5417 Radiculopathy, lumbosacral region: Secondary | ICD-10-CM | POA: Diagnosis not present

## 2014-11-21 DIAGNOSIS — M9905 Segmental and somatic dysfunction of pelvic region: Secondary | ICD-10-CM | POA: Diagnosis not present

## 2014-11-21 DIAGNOSIS — M542 Cervicalgia: Secondary | ICD-10-CM | POA: Diagnosis not present

## 2014-12-01 DIAGNOSIS — M542 Cervicalgia: Secondary | ICD-10-CM | POA: Diagnosis not present

## 2014-12-01 DIAGNOSIS — M9905 Segmental and somatic dysfunction of pelvic region: Secondary | ICD-10-CM | POA: Diagnosis not present

## 2014-12-01 DIAGNOSIS — M5417 Radiculopathy, lumbosacral region: Secondary | ICD-10-CM | POA: Diagnosis not present

## 2014-12-12 DIAGNOSIS — E118 Type 2 diabetes mellitus with unspecified complications: Secondary | ICD-10-CM | POA: Diagnosis not present

## 2014-12-12 DIAGNOSIS — E789 Disorder of lipoprotein metabolism, unspecified: Secondary | ICD-10-CM | POA: Diagnosis not present

## 2014-12-19 DIAGNOSIS — E032 Hypothyroidism due to medicaments and other exogenous substances: Secondary | ICD-10-CM | POA: Diagnosis not present

## 2014-12-19 DIAGNOSIS — E118 Type 2 diabetes mellitus with unspecified complications: Secondary | ICD-10-CM | POA: Diagnosis not present

## 2014-12-19 DIAGNOSIS — E789 Disorder of lipoprotein metabolism, unspecified: Secondary | ICD-10-CM | POA: Diagnosis not present

## 2014-12-21 DIAGNOSIS — M9905 Segmental and somatic dysfunction of pelvic region: Secondary | ICD-10-CM | POA: Diagnosis not present

## 2014-12-21 DIAGNOSIS — M542 Cervicalgia: Secondary | ICD-10-CM | POA: Diagnosis not present

## 2014-12-21 DIAGNOSIS — M5417 Radiculopathy, lumbosacral region: Secondary | ICD-10-CM | POA: Diagnosis not present

## 2014-12-26 DIAGNOSIS — M9905 Segmental and somatic dysfunction of pelvic region: Secondary | ICD-10-CM | POA: Diagnosis not present

## 2014-12-26 DIAGNOSIS — M542 Cervicalgia: Secondary | ICD-10-CM | POA: Diagnosis not present

## 2014-12-26 DIAGNOSIS — M5417 Radiculopathy, lumbosacral region: Secondary | ICD-10-CM | POA: Diagnosis not present

## 2015-01-17 ENCOUNTER — Encounter: Payer: Self-pay | Admitting: Endocrinology

## 2015-03-28 DIAGNOSIS — E118 Type 2 diabetes mellitus with unspecified complications: Secondary | ICD-10-CM | POA: Diagnosis not present

## 2015-03-28 DIAGNOSIS — G629 Polyneuropathy, unspecified: Secondary | ICD-10-CM | POA: Diagnosis not present

## 2015-04-13 DIAGNOSIS — E789 Disorder of lipoprotein metabolism, unspecified: Secondary | ICD-10-CM | POA: Diagnosis not present

## 2015-04-13 DIAGNOSIS — E039 Hypothyroidism, unspecified: Secondary | ICD-10-CM | POA: Diagnosis not present

## 2015-04-13 DIAGNOSIS — E118 Type 2 diabetes mellitus with unspecified complications: Secondary | ICD-10-CM | POA: Diagnosis not present

## 2015-04-20 DIAGNOSIS — E789 Disorder of lipoprotein metabolism, unspecified: Secondary | ICD-10-CM | POA: Diagnosis not present

## 2015-04-20 DIAGNOSIS — N400 Enlarged prostate without lower urinary tract symptoms: Secondary | ICD-10-CM | POA: Diagnosis not present

## 2015-04-20 DIAGNOSIS — M549 Dorsalgia, unspecified: Secondary | ICD-10-CM | POA: Diagnosis not present

## 2015-04-27 DIAGNOSIS — H25812 Combined forms of age-related cataract, left eye: Secondary | ICD-10-CM | POA: Diagnosis not present

## 2015-05-31 DIAGNOSIS — H524 Presbyopia: Secondary | ICD-10-CM | POA: Diagnosis not present

## 2015-05-31 DIAGNOSIS — H401111 Primary open-angle glaucoma, right eye, mild stage: Secondary | ICD-10-CM | POA: Diagnosis not present

## 2015-07-04 DIAGNOSIS — M5442 Lumbago with sciatica, left side: Secondary | ICD-10-CM | POA: Diagnosis not present

## 2015-07-04 DIAGNOSIS — M545 Low back pain: Secondary | ICD-10-CM | POA: Diagnosis not present

## 2015-07-12 DIAGNOSIS — M545 Low back pain: Secondary | ICD-10-CM | POA: Diagnosis not present

## 2015-07-17 DIAGNOSIS — M5416 Radiculopathy, lumbar region: Secondary | ICD-10-CM | POA: Diagnosis not present

## 2015-07-17 DIAGNOSIS — M545 Low back pain: Secondary | ICD-10-CM | POA: Diagnosis not present

## 2015-07-17 DIAGNOSIS — M5442 Lumbago with sciatica, left side: Secondary | ICD-10-CM | POA: Diagnosis not present

## 2015-07-20 DIAGNOSIS — N400 Enlarged prostate without lower urinary tract symptoms: Secondary | ICD-10-CM | POA: Diagnosis not present

## 2015-07-20 DIAGNOSIS — E789 Disorder of lipoprotein metabolism, unspecified: Secondary | ICD-10-CM | POA: Diagnosis not present

## 2015-07-20 DIAGNOSIS — M549 Dorsalgia, unspecified: Secondary | ICD-10-CM | POA: Diagnosis not present

## 2015-08-02 DIAGNOSIS — M5416 Radiculopathy, lumbar region: Secondary | ICD-10-CM | POA: Diagnosis not present

## 2015-08-10 DIAGNOSIS — J0100 Acute maxillary sinusitis, unspecified: Secondary | ICD-10-CM | POA: Diagnosis not present

## 2015-08-10 DIAGNOSIS — H81399 Other peripheral vertigo, unspecified ear: Secondary | ICD-10-CM | POA: Diagnosis not present

## 2015-08-23 DIAGNOSIS — M5416 Radiculopathy, lumbar region: Secondary | ICD-10-CM | POA: Diagnosis not present

## 2015-09-05 DIAGNOSIS — M47816 Spondylosis without myelopathy or radiculopathy, lumbar region: Secondary | ICD-10-CM | POA: Diagnosis not present

## 2015-09-25 DIAGNOSIS — M47816 Spondylosis without myelopathy or radiculopathy, lumbar region: Secondary | ICD-10-CM | POA: Diagnosis not present

## 2015-10-18 DIAGNOSIS — E789 Disorder of lipoprotein metabolism, unspecified: Secondary | ICD-10-CM | POA: Diagnosis not present

## 2015-10-18 DIAGNOSIS — Z125 Encounter for screening for malignant neoplasm of prostate: Secondary | ICD-10-CM | POA: Diagnosis not present

## 2015-10-18 DIAGNOSIS — E0789 Other specified disorders of thyroid: Secondary | ICD-10-CM | POA: Diagnosis not present

## 2015-10-18 DIAGNOSIS — E118 Type 2 diabetes mellitus with unspecified complications: Secondary | ICD-10-CM | POA: Diagnosis not present

## 2015-11-08 DIAGNOSIS — Z87891 Personal history of nicotine dependence: Secondary | ICD-10-CM | POA: Diagnosis not present

## 2015-11-08 DIAGNOSIS — M549 Dorsalgia, unspecified: Secondary | ICD-10-CM | POA: Diagnosis not present

## 2015-11-08 DIAGNOSIS — N400 Enlarged prostate without lower urinary tract symptoms: Secondary | ICD-10-CM | POA: Diagnosis not present

## 2015-11-08 DIAGNOSIS — E118 Type 2 diabetes mellitus with unspecified complications: Secondary | ICD-10-CM | POA: Diagnosis not present

## 2015-11-08 DIAGNOSIS — Z720 Tobacco use: Secondary | ICD-10-CM | POA: Diagnosis not present

## 2015-12-05 DIAGNOSIS — E032 Hypothyroidism due to medicaments and other exogenous substances: Secondary | ICD-10-CM | POA: Diagnosis not present

## 2015-12-05 DIAGNOSIS — M549 Dorsalgia, unspecified: Secondary | ICD-10-CM | POA: Diagnosis not present

## 2015-12-05 DIAGNOSIS — N390 Urinary tract infection, site not specified: Secondary | ICD-10-CM | POA: Diagnosis not present

## 2015-12-05 DIAGNOSIS — E789 Disorder of lipoprotein metabolism, unspecified: Secondary | ICD-10-CM | POA: Diagnosis not present

## 2015-12-19 DIAGNOSIS — N390 Urinary tract infection, site not specified: Secondary | ICD-10-CM | POA: Diagnosis not present

## 2015-12-27 DIAGNOSIS — R310 Gross hematuria: Secondary | ICD-10-CM | POA: Diagnosis not present

## 2015-12-27 DIAGNOSIS — Z0000 Encounter for general adult medical examination without abnormal findings: Secondary | ICD-10-CM | POA: Diagnosis not present

## 2015-12-27 DIAGNOSIS — N401 Enlarged prostate with lower urinary tract symptoms: Secondary | ICD-10-CM | POA: Diagnosis not present

## 2015-12-27 DIAGNOSIS — R300 Dysuria: Secondary | ICD-10-CM | POA: Diagnosis not present

## 2015-12-27 DIAGNOSIS — E118 Type 2 diabetes mellitus with unspecified complications: Secondary | ICD-10-CM | POA: Diagnosis not present

## 2016-01-01 DIAGNOSIS — R310 Gross hematuria: Secondary | ICD-10-CM | POA: Diagnosis not present

## 2016-01-03 DIAGNOSIS — R319 Hematuria, unspecified: Secondary | ICD-10-CM | POA: Diagnosis not present

## 2016-01-04 ENCOUNTER — Other Ambulatory Visit: Payer: Self-pay | Admitting: Urology

## 2016-01-04 DIAGNOSIS — D3500 Benign neoplasm of unspecified adrenal gland: Secondary | ICD-10-CM | POA: Diagnosis not present

## 2016-01-24 ENCOUNTER — Encounter (HOSPITAL_BASED_OUTPATIENT_CLINIC_OR_DEPARTMENT_OTHER): Payer: Self-pay | Admitting: *Deleted

## 2016-01-24 NOTE — Progress Notes (Signed)
NPO AFTER MN.  ARRIVE AT 0730.  NEEDS ISTAT AND EKG.  WILL TAKE LIPITOR, FLOMAX, AND GABAPENTIN AM DOS W/ SIPS OF WATER.

## 2016-01-26 NOTE — Anesthesia Preprocedure Evaluation (Addendum)
Anesthesia Evaluation  Patient identified by MRN, date of birth, ID band Patient awake    Reviewed: Allergy & Precautions, NPO status , Patient's Chart, lab work & pertinent test results  Airway Mallampati: II   Neck ROM: Full    Dental  (+) Edentulous Upper, Edentulous Lower   Pulmonary neg pulmonary ROS, former smoker (quit 2005),    breath sounds clear to auscultation       Cardiovascular negative cardio ROS   Rhythm:Regular     Neuro/Psych negative neurological ROS  negative psych ROS   GI/Hepatic negative GI ROS, Neg liver ROS,   Endo/Other  negative endocrine ROSdiabetes, Type 2, Oral Hypoglycemic AgentsHypothyroidism (replacement RX)   Renal/GU negative Renal ROS  negative genitourinary   Musculoskeletal negative musculoskeletal ROS (+)   Abdominal   Peds negative pediatric ROS (+)  Hematology negative hematology ROS (+)   Anesthesia Other Findings Glaucoma R eye, has had surgical procedure on eye  Reproductive/Obstetrics negative OB ROS                            Anesthesia Physical Anesthesia Plan  ASA: II  Anesthesia Plan: General   Post-op Pain Management:    Induction: Intravenous  Airway Management Planned: LMA  Additional Equipment:   Intra-op Plan:   Post-operative Plan:   Informed Consent: I have reviewed the patients History and Physical, chart, labs and discussed the procedure including the risks, benefits and alternatives for the proposed anesthesia with the patient or authorized representative who has indicated his/her understanding and acceptance.     Plan Discussed with:   Anesthesia Plan Comments: (Need to chek EKG and ISTAT to be done on admission   )        Anesthesia Quick Evaluation

## 2016-01-28 NOTE — H&P (Signed)
  Urology History and Physical Exam  CC: Bladder lesion  HPI: 75 year old male presents for cystoscopy and biopsy of a bladder lesion found on cystoscopy for evaluation of hematuria.  PMH: Past Medical History  Diagnosis Date  . Hyperlipidemia   . Hypothyroidism   . Lesion of bladder   . Type 2 diabetes mellitus ([place])   . BPH (benign prostatic hypertrophy)   . Lower urinary tract symptoms (LUTS)   . Glaucoma, right eye   . Oral thrush     dx 01-22-2016  . Peripheral neuropathy (H[facility])     PSH: Past Surgical History  Procedure Laterality Date  . Glaucoma surgery  1998 and 2000  . Anterior cervical decomp/discectomy fusion  08-15-2000    C5 -- C6  . Anterior cervical decomp/discectomy fusion  03-04-2008    C6 -- C7    Allergies: No Known Allergies  Medications: No prescriptions prior to admission     Social History: Social History   Social History  . Marital Status: Married    Spouse Name: N/A  . Number of Children: N/A  . Years of Education: N/A   Occupational History  . Not on file.   Social History Main Topics  . Smoking status: Former Smoker -- 50 years    Types: Cigarettes    Quit date: 06/27/2004  . Smokeless tobacco: Never Used  . Alcohol Use: No  . Drug Use: No  . Sexual Activity: Not on file   Other Topics Concern  . Not on file   Social History Narrative    Family History: Family History  Problem Relation Age of Onset  . Heart disease Mother   . Heart disease Father     Review of Systems: Genitourinary, constitutional, skin, eye, otolaryngeal, hematologic/lymphatic, cardiovascular, pulmonary, endocrine, musculoskeletal, gastrointestinal, neurological and psychiatric system(s) were reviewed and pertinent findings if present are noted and are otherwise negative.  Genitourinary: urinary frequency, feelings of urinary urgency, dysuria, nocturia, incontinence, difficulty starting the urinary stream, weak urinary stream, urinary stream  starts and stops, erectile dysfunction and penile pain.  Gastrointestinal: heartburn.  Constitutional: feeling tired (fatigue).  Eyes: blurred vision and diplopia.  ENT: sinus problems.  Musculoskeletal: back pain and joint pain.  Neurological: headache and dizziness.                Physical Exam: @VITALS2 @ General: No acute distress.  Awake. Head:  Normocephalic.  Atraumatic. ENT:  EOMI.  Mucous membranes moist Neck:  Supple.  No lymphadenopathy. CV:  S1 present. S2 present. Regular rate. Pulmonary: Equal effort bilaterally.  Clear to auscultation bilaterally. Abdomen: Soft.  Non tender to palpation. Skin:  Normal turgor.  No visible rash. Extremity: No gross deformity of bilateral upper extremities.  No gross deformity of                             lower extremities. Neurologic: Alert. Appropriate mood.    Studies:  No results for input(s): HGB, WBC, PLT in the last 72 hours.  No results for input(s): NA, K, CL, CO2, BUN, CREATININE, CALCIUM, GFRNONAA, GFRAA in the last 72 hours.  Invalid input(s): MAGNESIUM   No results for input(s): INR, APTT in the last 72 hours.  Invalid input(s): PT   Invalid input(s): ABG    Assessment:  Bladder lesion  Plan: Cystoscopy and biopsy of bladder lesion

## 2016-01-29 ENCOUNTER — Ambulatory Visit (HOSPITAL_BASED_OUTPATIENT_CLINIC_OR_DEPARTMENT_OTHER): Payer: Medicare Other | Admitting: Anesthesiology

## 2016-01-29 ENCOUNTER — Encounter (HOSPITAL_BASED_OUTPATIENT_CLINIC_OR_DEPARTMENT_OTHER)
Admission: RE | Disposition: A | Discharge status: Home / Self Care | Payer: Self-pay | Source: Ambulatory Visit | Attending: Urology

## 2016-01-29 ENCOUNTER — Ambulatory Visit (HOSPITAL_BASED_OUTPATIENT_CLINIC_OR_DEPARTMENT_OTHER)
Admission: RE | Admit: 2016-01-29 | Discharge: 2016-01-29 | Disposition: A | Discharge status: Home / Self Care | Payer: Medicare Other | Source: Ambulatory Visit | Attending: Urology | Admitting: Urology

## 2016-01-29 ENCOUNTER — Other Ambulatory Visit: Payer: Self-pay

## 2016-01-29 ENCOUNTER — Encounter (HOSPITAL_BASED_OUTPATIENT_CLINIC_OR_DEPARTMENT_OTHER): Payer: Self-pay | Admitting: *Deleted

## 2016-01-29 DIAGNOSIS — D090 Carcinoma in situ of bladder: Secondary | ICD-10-CM | POA: Insufficient documentation

## 2016-01-29 DIAGNOSIS — E785 Hyperlipidemia, unspecified: Secondary | ICD-10-CM | POA: Insufficient documentation

## 2016-01-29 DIAGNOSIS — H409 Unspecified glaucoma: Secondary | ICD-10-CM | POA: Insufficient documentation

## 2016-01-29 DIAGNOSIS — E039 Hypothyroidism, unspecified: Secondary | ICD-10-CM | POA: Insufficient documentation

## 2016-01-29 DIAGNOSIS — E1142 Type 2 diabetes mellitus with diabetic polyneuropathy: Secondary | ICD-10-CM | POA: Diagnosis not present

## 2016-01-29 DIAGNOSIS — N3289 Other specified disorders of bladder: Secondary | ICD-10-CM | POA: Diagnosis not present

## 2016-01-29 DIAGNOSIS — Z7984 Long term (current) use of oral hypoglycemic drugs: Secondary | ICD-10-CM | POA: Diagnosis not present

## 2016-01-29 DIAGNOSIS — Z87891 Personal history of nicotine dependence: Secondary | ICD-10-CM | POA: Insufficient documentation

## 2016-01-29 DIAGNOSIS — D494 Neoplasm of unspecified behavior of bladder: Secondary | ICD-10-CM | POA: Diagnosis not present

## 2016-01-29 DIAGNOSIS — Z79899 Other long term (current) drug therapy: Secondary | ICD-10-CM | POA: Insufficient documentation

## 2016-01-29 DIAGNOSIS — N401 Enlarged prostate with lower urinary tract symptoms: Secondary | ICD-10-CM | POA: Insufficient documentation

## 2016-01-29 DIAGNOSIS — N329 Bladder disorder, unspecified: Secondary | ICD-10-CM

## 2016-01-29 HISTORY — DX: Polyneuropathy, unspecified: G62.9

## 2016-01-29 HISTORY — DX: Type 2 diabetes mellitus without complications: E11.9

## 2016-01-29 HISTORY — DX: Unspecified glaucoma: H40.9

## 2016-01-29 HISTORY — DX: Benign prostatic hyperplasia without lower urinary tract symptoms: N40.0

## 2016-01-29 HISTORY — DX: Hypothyroidism, unspecified: E03.9

## 2016-01-29 HISTORY — DX: Unspecified symptoms and signs involving the genitourinary system: R39.9

## 2016-01-29 HISTORY — PX: CYSTOSCOPY WITH BIOPSY: SHX5122

## 2016-01-29 LAB — GLUCOSE, CAPILLARY: GLUCOSE-CAPILLARY: 154 mg/dL — AB (ref 65–99)

## 2016-01-29 LAB — POCT I-STAT, CHEM 8
BUN: 18 mg/dL (ref 6–20)
CALCIUM ION: 1.28 mmol/L (ref 1.13–1.30)
CHLORIDE: 106 mmol/L (ref 101–111)
CREATININE: 0.7 mg/dL (ref 0.61–1.24)
Glucose, Bld: 152 mg/dL — ABNORMAL HIGH (ref 65–99)
HCT: 40 % (ref 39.0–52.0)
Hemoglobin: 13.6 g/dL (ref 13.0–17.0)
Potassium: 3.8 mmol/L (ref 3.5–5.1)
SODIUM: 146 mmol/L — AB (ref 135–145)
TCO2: 26 mmol/L (ref 0–100)

## 2016-01-29 SURGERY — CYSTOSCOPY, WITH BIOPSY
Anesthesia: General | Site: Bladder

## 2016-01-29 MED ORDER — DEXAMETHASONE SODIUM PHOSPHATE 4 MG/ML IJ SOLN
INTRAMUSCULAR | Status: DC | PRN
  Administered 2016-01-29: 5 mg via INTRAVENOUS

## 2016-01-29 MED ORDER — ONDANSETRON HCL 4 MG/2ML IJ SOLN
INTRAMUSCULAR | Status: DC | PRN
  Administered 2016-01-29: 4 mg via INTRAVENOUS

## 2016-01-29 MED ORDER — FENTANYL CITRATE (PF) 100 MCG/2ML IJ SOLN
INTRAMUSCULAR | Status: DC | PRN
  Administered 2016-01-29: 25 ug via INTRAVENOUS

## 2016-01-29 MED ORDER — ONDANSETRON HCL 4 MG/2ML IJ SOLN
INTRAMUSCULAR | Status: AC
  Filled 2016-01-29: qty 2

## 2016-01-29 MED ORDER — DEXAMETHASONE SODIUM PHOSPHATE 10 MG/ML IJ SOLN
INTRAMUSCULAR | Status: AC
  Filled 2016-01-29: qty 1

## 2016-01-29 MED ORDER — LIDOCAINE HCL (CARDIAC) 20 MG/ML IV SOLN
INTRAVENOUS | Status: AC
Start: 2016-01-29 — End: 2016-01-29
  Filled 2016-01-29: qty 10

## 2016-01-29 MED ORDER — PROPOFOL 10 MG/ML IV BOLUS
INTRAVENOUS | Status: DC | PRN
  Administered 2016-01-29: 170 mg via INTRAVENOUS

## 2016-01-29 MED ORDER — LIDOCAINE HCL (CARDIAC) 20 MG/ML IV SOLN
INTRAVENOUS | Status: DC | PRN
  Administered 2016-01-29: 80 mg via INTRAVENOUS

## 2016-01-29 MED ORDER — LIDOCAINE HCL (CARDIAC) 20 MG/ML IV SOLN
INTRAVENOUS | Status: AC
  Filled 2016-01-29: qty 5

## 2016-01-29 MED ORDER — CEFAZOLIN SODIUM-DEXTROSE 2-4 GM/100ML-% IV SOLN
2.0000 g | INTRAVENOUS | Status: AC
  Administered 2016-01-29: 2 g via INTRAVENOUS
  Filled 2016-01-29: qty 100

## 2016-01-29 MED ORDER — MIDAZOLAM HCL 2 MG/2ML IJ SOLN
INTRAMUSCULAR | Status: AC
  Filled 2016-01-29: qty 2

## 2016-01-29 MED ORDER — FENTANYL CITRATE (PF) 100 MCG/2ML IJ SOLN
INTRAMUSCULAR | Status: AC
  Filled 2016-01-29: qty 2

## 2016-01-29 MED ORDER — MIDAZOLAM HCL 5 MG/5ML IJ SOLN
INTRAMUSCULAR | Status: DC | PRN
  Administered 2016-01-29: 2 mg via INTRAVENOUS

## 2016-01-29 MED ORDER — CEPHALEXIN 250 MG PO CAPS: 250.0000 mg | ORAL_CAPSULE | Freq: Three times a day (TID) | ORAL | Status: DC

## 2016-01-29 MED ORDER — LACTATED RINGERS IV SOLN
INTRAVENOUS | Status: DC
  Administered 2016-01-29: 08:00:00 via INTRAVENOUS
  Filled 2016-01-29: qty 1000

## 2016-01-29 MED ORDER — STERILE WATER FOR IRRIGATION IR SOLN
Status: DC | PRN
  Administered 2016-01-29: 3000 mL via INTRAVESICAL

## 2016-01-29 MED ORDER — CEFAZOLIN SODIUM-DEXTROSE 2-4 GM/100ML-% IV SOLN
INTRAVENOUS | Status: AC
  Filled 2016-01-29: qty 100

## 2016-01-29 MED ORDER — CEFAZOLIN SODIUM 1-5 GM-% IV SOLN
1.0000 g | INTRAVENOUS | Status: DC
  Filled 2016-01-29: qty 50

## 2016-01-29 SURGICAL SUPPLY — 29 items
BAG DRAIN URO-CYSTO SKYTR STRL (DRAIN) ×3 IMPLANT
BAG URINE LEG 19OZ MD ST LTX (BAG) ×3 IMPLANT
CANISTER SUCT LVC 12 LTR MEDI- (MISCELLANEOUS) IMPLANT
CATH FOLEY 2WAY SLVR  5CC 18FR (CATHETERS) ×2
CATH FOLEY 2WAY SLVR 5CC 18FR (CATHETERS) ×1 IMPLANT
CLOTH BEACON ORANGE TIMEOUT ST (SAFETY) ×3 IMPLANT
ELECT REM PT RETURN 9FT ADLT (ELECTROSURGICAL) ×3
ELECTRODE REM PT RTRN 9FT ADLT (ELECTROSURGICAL) ×1 IMPLANT
GLOVE BIO SURGEON STRL SZ 6.5 (GLOVE) ×2 IMPLANT
GLOVE BIO SURGEON STRL SZ8 (GLOVE) ×3 IMPLANT
GLOVE BIO SURGEONS STRL SZ 6.5 (GLOVE) ×1
GLOVE BIOGEL PI IND STRL 6.5 (GLOVE) ×2 IMPLANT
GLOVE BIOGEL PI INDICATOR 6.5 (GLOVE) ×4
GOWN STRL REUS W/ TWL LRG LVL3 (GOWN DISPOSABLE) ×1 IMPLANT
GOWN STRL REUS W/ TWL XL LVL3 (GOWN DISPOSABLE) ×2 IMPLANT
GOWN STRL REUS W/TWL LRG LVL3 (GOWN DISPOSABLE) ×2
GOWN STRL REUS W/TWL XL LVL3 (GOWN DISPOSABLE) ×4
KIT ROOM TURNOVER WOR (KITS) ×3 IMPLANT
MANIFOLD NEPTUNE II (INSTRUMENTS) ×3 IMPLANT
NDL SAFETY ECLIPSE 18X1.5 (NEEDLE) IMPLANT
NEEDLE HYPO 18GX1.5 SHARP (NEEDLE)
NEEDLE HYPO 22GX1.5 SAFETY (NEEDLE) IMPLANT
NS IRRIG 500ML POUR BTL (IV SOLUTION) IMPLANT
PACK CYSTO (CUSTOM PROCEDURE TRAY) ×3 IMPLANT
SYR 20CC LL (SYRINGE) IMPLANT
TUBE CONNECTING 12'X1/4 (SUCTIONS)
TUBE CONNECTING 12X1/4 (SUCTIONS) IMPLANT
WATER STERILE IRR 3000ML UROMA (IV SOLUTION) ×3 IMPLANT
WATER STERILE IRR 500ML POUR (IV SOLUTION) ×3 IMPLANT

## 2016-01-29 NOTE — Anesthesia Postprocedure Evaluation (Signed)
Anesthesia Post Note  Patient: Keith [name]  Procedure(s) Performed: Procedure(s) (LRB): CYSTOSCOPY WITH BIOPSY (N/A)  Patient location during evaluation: PACU Anesthesia Type: General Level of consciousness: awake and alert Pain management: pain level controlled Vital Signs Assessment: post-procedure vital signs reviewed and stable Respiratory status: spontaneous breathing, nonlabored ventilation, respiratory function stable and patient connected to nasal cannula oxygen Cardiovascular status: blood pressure returned to baseline and stable Postop Assessment: no signs of nausea or vomiting Anesthetic complications: no    Last Vitals:  Filed Vitals:   01/29/16 0930 01/29/16 0945  BP: 157/79 171/82  Pulse: 64 63  Temp:    Resp: 15 15    Last Pain: There were no vitals filed for this visit.               Alexis Frock

## 2016-01-29 NOTE — Discharge Instructions (Addendum)
1. You may see some blood in the urine and may have some burning with urination for 48-72 hours. You also may notice that you have to urinate more frequently or urgently after your procedure which is normal.  2. You should call should you develop an inability urinate, fever > 101, persistent nausea and vomiting that prevents you from eating or drinking to stay hydrated.  3. If you have a catheter, you will be taught how to take care of the catheter by the nursing staff prior to discharge from the hospital. It is okay to remove the catheter on Tuesday morning, as instructed. You may periodically feel a strong urge to void with the catheter in place.  This is a bladder spasm and most often can occur when having a bowel movement or moving around. It is typically self-limited and usually will stop after a few minutes.  You may use some Vaseline or Neosporin around the tip of the catheter to reduce friction at the tip of the penis. You may also see some blood in the urine.  A very small amount of blood can make the urine look quite red.  As long as the catheter is draining well, there usually is not a problem.  However, if the catheter is not draining well and is bloody, you should call the office [phone] to notify us.     Post Anesthesia Home Care Instructions  Activity: Get plenty of rest for the remainder of the day. A responsible adult should stay with you for 24 hours following the procedure.  For the next 24 hours, DO NOT: -Drive a car -Paediatric nurse -Drink alcoholic beverages -Take any medication unless instructed by your physician -Make any legal decisions or sign important papers.  Meals: Start with liquid foods such as gelatin or soup. Progress to regular foods as tolerated. Avoid greasy, spicy, heavy foods. If nausea and/or vomiting occur, drink only clear liquids until the nausea and/or vomiting subsides. Call your physician if vomiting continues.  Special  Instructions/Symptoms: Your throat may feel dry or sore from the anesthesia or the breathing tube placed in your throat during surgery. If this causes discomfort, gargle with warm salt water. The discomfort should disappear within 24 hours.  If you had a scopolamine patch placed behind your ear for the management of post- operative nausea and/or vomiting:  1. The medication in the patch is effective for 72 hours, after which it should be removed.  Wrap patch in a tissue and discard in the trash. Wash hands thoroughly with soap and water. 2. You may remove the patch earlier than 72 hours if you experience unpleasant side effects which may include dry mouth, dizziness or visual disturbances. 3. Avoid touching the patch. Wash your hands with soap and water after contact with the patch.

## 2016-01-29 NOTE — Transfer of Care (Signed)
Last Vitals:  Filed Vitals:   01/29/16 0734  BP: 178/76  Pulse: 67  Temp: 36.6 C  Resp: 16    Last Pain: There were no vitals filed for this visit.    Patients Stated Pain Goal: 7 (01/29/16 [id number])  Immediate Anesthesia Transfer of Care Note  Patient: Keith [name]  Procedure(s) Performed: Procedure(s) (LRB): CYSTOSCOPY WITH BIOPSY (N/A)  Patient Location: PACU  Anesthesia Type: General  Level of Consciousness: awake, alert  and oriented  Airway & Oxygen Therapy: Patient Spontanous Breathing and Patient connected to face mask oxygen  Post-op Assessment: Report given to PACU RN and Post -op Vital signs reviewed and stable  Post vital signs: Reviewed and stable  Complications: No apparent anesthesia complications

## 2016-01-29 NOTE — Anesthesia Procedure Notes (Signed)
Procedure Name: LMA Insertion Date/Time: 01/29/2016 8:50 AM Performed by: Wanita Chamberlain Pre-anesthesia Checklist: Patient identified, Timeout performed, Emergency Drugs available, Suction available and Patient being monitored Patient Re-evaluated:Patient Re-evaluated prior to inductionOxygen Delivery Method: Circle system utilized Preoxygenation: Pre-oxygenation with 100% oxygen Intubation Type: IV induction Ventilation: Mask ventilation without difficulty LMA: LMA inserted LMA Size: 4.0 Number of attempts: 1 Placement Confirmation: positive ETCO2 and breath sounds checked- equal and bilateral Tube secured with: Tape Dental Injury: Teeth and Oropharynx as per pre-operative assessment

## 2016-01-30 ENCOUNTER — Encounter (HOSPITAL_BASED_OUTPATIENT_CLINIC_OR_DEPARTMENT_OTHER): Payer: Self-pay | Admitting: Urology

## 2016-02-02 NOTE — Op Note (Signed)
PATIENT:  [name]  PRE-OPERATIVE DIAGNOSIS: Left bladder wall lesion  POST-OPERATIVE DIAGNOSIS: Same  PROCEDURE: Cystoscopy, bladder biopsy/TURBT  SURGEON:  Lillette Boxer. Virtie Bungert, M.D.  ANESTHESIA:  General  EBL:  Minimal  DRAINS: 68 French Foley catheter, to straight drain  LOCAL MEDICATIONS USED:  None  SPECIMEN:  Left bladder wall lesion, to pathology  INDICATION: KAPONO BICKNELL is a 75 year old male presents at this time for cystoscopic management of a bladder lesion. He has a history of microscopic hematuria. Evaluation revealed normal upper tracts but a left lateral wall lesion just in the left trigonal region. Because of the worrisome nature of this lesion, he presents for excisional biopsy. Risks and complications of the procedure have been discussed with the patient who understands these and desires to proceed.   Description of procedure: The patient was properly identified and marked (if applicable) in the holding area. They were then  taken to the operating room and placed on the table in a supine position. General anesthesia was then administered. Once fully anesthetized the patient was moved to the dorsolithotomy position and the genitalia and perineum were sterilely prepped and draped in standard fashion. An official timeout was then performed.  A 23 French panendoscope was advanced into the patient's bladder. Urethra was normal. Prostate was nonobstructive. The bladder was entered and inspected circumferentially. There were no trabeculations or foreign bodies. Ureteral orifices were normal in configuration and location. A 13-14 mm erythematous lesion was noted just inferior and medial to the left ureteral orifice. No other urothelial lesions were noted. There were several tiny papillary areas around this lesion. Using cold cup biopsy forceps, the entire lesion was excised. All biopsy specimens were sent, labeled "left bladder wall lesion". Following excision of this  entire region, the Bugbee electrode was utilized to cauterize this area, assuring adequate hemostasis. Inspection revealed no bleeding, no other bladder lesions were noted, and at this point the scope was removed. An 15 French Foley catheter was in place. The balloon filled with 10 mL of water. Was then hooked to bag drainage.  At this point, the procedure was terminated. The patient was awakened and taken to the PACU in stable condition.      PLAN OF CARE: Discharge to home after PACU  PATIENT DISPOSITION:  PACU - hemodynamically stable.

## 2016-02-28 DIAGNOSIS — C670 Malignant neoplasm of trigone of bladder: Secondary | ICD-10-CM | POA: Diagnosis not present

## 2016-03-06 DIAGNOSIS — C670 Malignant neoplasm of trigone of bladder: Secondary | ICD-10-CM | POA: Diagnosis not present

## 2016-03-06 DIAGNOSIS — Z5111 Encounter for antineoplastic chemotherapy: Secondary | ICD-10-CM | POA: Diagnosis not present

## 2016-03-13 DIAGNOSIS — C670 Malignant neoplasm of trigone of bladder: Secondary | ICD-10-CM | POA: Diagnosis not present

## 2016-03-13 DIAGNOSIS — Z5111 Encounter for antineoplastic chemotherapy: Secondary | ICD-10-CM | POA: Diagnosis not present

## 2016-03-20 DIAGNOSIS — Z5111 Encounter for antineoplastic chemotherapy: Secondary | ICD-10-CM | POA: Diagnosis not present

## 2016-03-20 DIAGNOSIS — C670 Malignant neoplasm of trigone of bladder: Secondary | ICD-10-CM | POA: Diagnosis not present

## 2016-03-27 DIAGNOSIS — Z5111 Encounter for antineoplastic chemotherapy: Secondary | ICD-10-CM | POA: Diagnosis not present

## 2016-03-27 DIAGNOSIS — C670 Malignant neoplasm of trigone of bladder: Secondary | ICD-10-CM | POA: Diagnosis not present

## 2016-04-03 DIAGNOSIS — Z5111 Encounter for antineoplastic chemotherapy: Secondary | ICD-10-CM | POA: Diagnosis not present

## 2016-04-03 DIAGNOSIS — C670 Malignant neoplasm of trigone of bladder: Secondary | ICD-10-CM | POA: Diagnosis not present

## 2016-04-05 ENCOUNTER — Other Ambulatory Visit: Payer: Self-pay

## 2016-04-24 DIAGNOSIS — C679 Malignant neoplasm of bladder, unspecified: Secondary | ICD-10-CM | POA: Diagnosis not present

## 2016-04-24 DIAGNOSIS — N390 Urinary tract infection, site not specified: Secondary | ICD-10-CM | POA: Diagnosis not present

## 2016-04-24 DIAGNOSIS — M549 Dorsalgia, unspecified: Secondary | ICD-10-CM | POA: Diagnosis not present

## 2016-05-22 ENCOUNTER — Other Ambulatory Visit: Payer: Self-pay | Admitting: Urology

## 2016-05-22 DIAGNOSIS — D090 Carcinoma in situ of bladder: Secondary | ICD-10-CM | POA: Diagnosis not present

## 2016-05-27 DIAGNOSIS — H401111 Primary open-angle glaucoma, right eye, mild stage: Secondary | ICD-10-CM | POA: Diagnosis not present

## 2016-05-28 ENCOUNTER — Encounter (HOSPITAL_BASED_OUTPATIENT_CLINIC_OR_DEPARTMENT_OTHER): Payer: Self-pay | Admitting: *Deleted

## 2016-05-29 ENCOUNTER — Encounter (HOSPITAL_BASED_OUTPATIENT_CLINIC_OR_DEPARTMENT_OTHER): Payer: Self-pay | Admitting: *Deleted

## 2016-05-29 NOTE — H&P (Signed)
Urology History and Physical Exam  CC: Bladder cancer  HPI: 75 year old male presents at this time for repeat bladder biopsy.  The patient underwent TURBT on 01/29/2016.  Biopsy at that time revealed high-grade carcinoma in situ.  He underwent induction BCG, completing this a few weeks ago.  He tolerated it well.  Recent cystoscopy revealed persistent disease in his left bladder neck/bladder wall area.he presents for repeat biopsy.  PMH: Past Medical History:  Diagnosis Date  . BPH (benign prostatic hypertrophy)   . Full dentures   . Glaucoma, right eye   . Hyperlipidemia   . Hypothyroidism   . 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 Lower urinary tract symptoms (LUTS)   . Peripheral neuropathy ([place])   . Type 2 diabetes mellitus ([place])   . Wears glasses     PSH: Past Surgical History:  Procedure Laterality Date  . ANTERIOR CERVICAL DECOMP/DISCECTOMY FUSION  08-15-2000   C5 -- C6  . ANTERIOR CERVICAL DECOMP/DISCECTOMY FUSION  03-04-2008   C6 -- C7  . CYSTOSCOPY WITH BIOPSY N/A 01/29/2016   Procedure: CYSTOSCOPY WITH BIOPSY;  Surgeon: Franchot Gallo, MD;  Location: [facility];  Service: Urology;  Laterality: N/A;  . GLAUCOMA SURGERY  1998 and 2000    Allergies: No Known Allergies  Medications: No prescriptions prior to admission.     Social History: Social History   Social History  . Marital status: Married    Spouse name: N/A  . Number of children: N/A  . Years of education: N/A   Occupational History  . Not on file.   Social History Main Topics  . Smoking status: Former Smoker    Years: 50.00    Types: Cigarettes    Quit date: 06/27/2004  . Smokeless tobacco: Never Used  . Alcohol use No  . Drug use: No  . Sexual activity: Not on file   Other Topics Concern  . Not on file   Social History Narrative  . No narrative on file    Family  History: Family History  Problem Relation Age of Onset  . Heart disease Mother   . Heart disease Father     Review of Systems: GU Review Male: Patient reports get up at night to urinate, stream starts and stops, trouble starting your stream, have to strain to urinate , and erection problems. Patient denies frequent urination, hard to postpone urination, burning/ pain with urination, leakage of urine, and penile pain. Gastrointestinal (Upper): Patient reports indigestion/ heartburn. Patient denies nausea and vomiting. Gastrointestinal (Lower): Patient denies diarrhea and constipation. Constitutional: Patient reports fatigue. Patient denies fever, night sweats, and weight loss. Skin: Patient denies skin rash/ lesion and itching. Eyes: Patient reports blurred vision and double vision. Ears/ Nose/ Throat: Patient reports sore throat and sinus problems. Hematologic/Lymphatic: Patient denies swollen glands and easy bruising. Cardiovascular: Patient reports leg swelling. Patient denies chest pains. Respiratory: Patient reports cough. Patient denies shortness of breath. Endocrine: Patient denies excessive thirst. Musculoskeletal: Patient reports back pain and joint pain. Neurological: Patient denies headaches and dizziness. Psychologic: Patient denies depression and anxiety.                 Physical Exam: @VITALS2 @ General: No acute distress.  Awake. Head:  Normocephalic.  Atraumatic. ENT:  EOMI.  Mucous membranes moist Neck:  Supple.  No lymphadenopathy. CV:  S1 present. S2 present. Regular rate. Pulmonary: Equal  effort bilaterally.  Clear to auscultation bilaterally. Abdomen: Soft.  Non- tender to palpation. Skin:  Normal turgor.  No visible rash. Extremity: No gross deformity of bilateral upper extremities.  No gross deformity of                             lower extremities. Neurologic: Alert. Appropriate mood.   Studies:  No results for input(s): HGB, WBC, PLT in the last 72 hours.  No  results for input(s): NA, K, CL, CO2, BUN, CREATININE, CALCIUM, GFRNONAA, GFRAA in the last 72 hours.  Invalid input(s): MAGNESIUM   No results for input(s): INR, APTT in the last 72 hours.  Invalid input(s): PT   Invalid input(s): ABG    Assessment:  Recurrent/persistent bladder cancer  Plan: Cystoscopy, repeat bladder biopsy/TURBT

## 2016-05-29 NOTE — Progress Notes (Signed)
SPOKE W/ WIFE, PT HOH OVER PHONE.  NPO AFTER MN.  ARRIVE AT 1030.  NEEDS ISTAT .  CURRENT EKG IN CHART AND EPIC.  WILL TAKE LIPITOR, FLOMAX, AND GABAPENTIN AM DOS W/ SIPS OF WATER.

## 2016-05-31 ENCOUNTER — Ambulatory Visit (HOSPITAL_BASED_OUTPATIENT_CLINIC_OR_DEPARTMENT_OTHER): Payer: Medicare Other | Admitting: Anesthesiology

## 2016-05-31 ENCOUNTER — Encounter (HOSPITAL_BASED_OUTPATIENT_CLINIC_OR_DEPARTMENT_OTHER)
Admission: RE | Disposition: A | Discharge status: Home / Self Care | Payer: Self-pay | Source: Ambulatory Visit | Attending: Urology

## 2016-05-31 ENCOUNTER — Encounter (HOSPITAL_BASED_OUTPATIENT_CLINIC_OR_DEPARTMENT_OTHER): Payer: Self-pay | Admitting: *Deleted

## 2016-05-31 ENCOUNTER — Ambulatory Visit (HOSPITAL_BASED_OUTPATIENT_CLINIC_OR_DEPARTMENT_OTHER)
Admission: RE | Admit: 2016-05-31 | Discharge: 2016-05-31 | Disposition: A | Discharge status: Home / Self Care | Payer: Medicare Other | Source: Ambulatory Visit | Attending: Urology | Admitting: Urology

## 2016-05-31 DIAGNOSIS — Z87891 Personal history of nicotine dependence: Secondary | ICD-10-CM | POA: Diagnosis not present

## 2016-05-31 DIAGNOSIS — E1142 Type 2 diabetes mellitus with diabetic polyneuropathy: Secondary | ICD-10-CM | POA: Insufficient documentation

## 2016-05-31 DIAGNOSIS — C677 Malignant neoplasm of urachus: Secondary | ICD-10-CM | POA: Insufficient documentation

## 2016-05-31 DIAGNOSIS — D090 Carcinoma in situ of bladder: Secondary | ICD-10-CM | POA: Diagnosis not present

## 2016-05-31 DIAGNOSIS — C679 Malignant neoplasm of bladder, unspecified: Secondary | ICD-10-CM | POA: Diagnosis not present

## 2016-05-31 DIAGNOSIS — E785 Hyperlipidemia, unspecified: Secondary | ICD-10-CM | POA: Insufficient documentation

## 2016-05-31 DIAGNOSIS — N400 Enlarged prostate without lower urinary tract symptoms: Secondary | ICD-10-CM | POA: Diagnosis not present

## 2016-05-31 DIAGNOSIS — E039 Hypothyroidism, unspecified: Secondary | ICD-10-CM | POA: Insufficient documentation

## 2016-05-31 DIAGNOSIS — C672 Malignant neoplasm of lateral wall of bladder: Secondary | ICD-10-CM | POA: Diagnosis not present

## 2016-05-31 HISTORY — DX: Complete loss of teeth, unspecified cause, unspecified class: K08.109

## 2016-05-31 HISTORY — DX: Presence of dental prosthetic device (complete) (partial): Z97.2

## 2016-05-31 HISTORY — DX: Presence of spectacles and contact lenses: Z97.3

## 2016-05-31 HISTORY — DX: Malignant neoplasm of bladder, unspecified: C67.9

## 2016-05-31 HISTORY — PX: CYSTOSCOPY WITH BIOPSY: SHX5122

## 2016-05-31 LAB — GLUCOSE, CAPILLARY: Glucose-Capillary: 119 mg/dL — ABNORMAL HIGH (ref 65–99)

## 2016-05-31 SURGERY — CYSTOSCOPY, WITH BIOPSY
Anesthesia: General | Site: Bladder

## 2016-05-31 MED ORDER — LIDOCAINE HCL (CARDIAC) 20 MG/ML IV SOLN
INTRAVENOUS | Status: DC | PRN
  Administered 2016-05-31: 50 mg via INTRAVENOUS

## 2016-05-31 MED ORDER — LIDOCAINE 2% (20 MG/ML) 5 ML SYRINGE
INTRAMUSCULAR | Status: AC
  Filled 2016-05-31: qty 5

## 2016-05-31 MED ORDER — MIDAZOLAM HCL 2 MG/2ML IJ SOLN
INTRAMUSCULAR | Status: AC
  Filled 2016-05-31: qty 2

## 2016-05-31 MED ORDER — SULFAMETHOXAZOLE-TRIMETHOPRIM 800-160 MG PO TABS: 1.0000 | ORAL_TABLET | Freq: Two times a day (BID) | ORAL | 0 refills | Status: DC

## 2016-05-31 MED ORDER — LACTATED RINGERS IV SOLN
INTRAVENOUS | Status: DC
  Administered 2016-05-31: 11:00:00 via INTRAVENOUS
  Filled 2016-05-31: qty 1000

## 2016-05-31 MED ORDER — CEFAZOLIN SODIUM-DEXTROSE 2-4 GM/100ML-% IV SOLN
INTRAVENOUS | Status: AC
  Filled 2016-05-31: qty 100

## 2016-05-31 MED ORDER — FENTANYL CITRATE (PF) 100 MCG/2ML IJ SOLN
INTRAMUSCULAR | Status: AC
  Filled 2016-05-31: qty 2

## 2016-05-31 MED ORDER — FENTANYL CITRATE (PF) 100 MCG/2ML IJ SOLN
INTRAMUSCULAR | Status: DC | PRN
Start: 2016-05-31 — End: 2016-05-31
  Administered 2016-05-31: 50 ug via INTRAVENOUS

## 2016-05-31 MED ORDER — PROPOFOL 10 MG/ML IV BOLUS
INTRAVENOUS | Status: AC
  Filled 2016-05-31: qty 40

## 2016-05-31 MED ORDER — ONDANSETRON HCL 4 MG/2ML IJ SOLN
INTRAMUSCULAR | Status: DC | PRN
  Administered 2016-05-31: 4 mg via INTRAVENOUS

## 2016-05-31 MED ORDER — DEXAMETHASONE SODIUM PHOSPHATE 4 MG/ML IJ SOLN
INTRAMUSCULAR | Status: DC | PRN
Start: 2016-05-31 — End: 2016-05-31
  Administered 2016-05-31: 8 mg via INTRAVENOUS

## 2016-05-31 MED ORDER — CEFAZOLIN IN D5W 1 GM/50ML IV SOLN
1.0000 g | INTRAVENOUS | Status: DC
  Filled 2016-05-31: qty 50

## 2016-05-31 MED ORDER — ONDANSETRON HCL 4 MG/2ML IJ SOLN
INTRAMUSCULAR | Status: AC
  Filled 2016-05-31: qty 2

## 2016-05-31 MED ORDER — PROPOFOL 10 MG/ML IV BOLUS
INTRAVENOUS | Status: DC | PRN
  Administered 2016-05-31: 150 mg via INTRAVENOUS

## 2016-05-31 MED ORDER — STERILE WATER FOR IRRIGATION IR SOLN
Status: DC | PRN
  Administered 2016-05-31: 3000 mL

## 2016-05-31 MED ORDER — CEFAZOLIN SODIUM-DEXTROSE 2-4 GM/100ML-% IV SOLN
2.0000 g | INTRAVENOUS | Status: AC
  Administered 2016-05-31: 2 g via INTRAVENOUS
  Filled 2016-05-31: qty 100

## 2016-05-31 MED ORDER — DEXAMETHASONE SODIUM PHOSPHATE 10 MG/ML IJ SOLN
INTRAMUSCULAR | Status: AC
  Filled 2016-05-31: qty 1

## 2016-05-31 SURGICAL SUPPLY — 22 items
BAG DRAIN URO-CYSTO SKYTR STRL (DRAIN) ×3 IMPLANT
BAG URINE LEG 19OZ MD ST LTX (BAG) ×3 IMPLANT
CATH FOLEY 2WAY SLVR  5CC 24FR (CATHETERS) ×2
CATH FOLEY 2WAY SLVR 5CC 24FR (CATHETERS) ×1 IMPLANT
CLOTH BEACON ORANGE TIMEOUT ST (SAFETY) ×3 IMPLANT
ELECT REM PT RETURN 9FT ADLT (ELECTROSURGICAL) ×3
ELECTRODE REM PT RTRN 9FT ADLT (ELECTROSURGICAL) ×1 IMPLANT
GLOVE BIO SURGEON STRL SZ 6.5 (GLOVE) ×2 IMPLANT
GLOVE BIO SURGEON STRL SZ8 (GLOVE) ×3 IMPLANT
GLOVE BIO SURGEONS STRL SZ 6.5 (GLOVE) ×1
GLOVE INDICATOR 6.5 STRL GRN (GLOVE) ×6 IMPLANT
GOWN STRL REUS W/ TWL LRG LVL3 (GOWN DISPOSABLE) IMPLANT
GOWN STRL REUS W/ TWL XL LVL3 (GOWN DISPOSABLE) IMPLANT
GOWN STRL REUS W/TWL LRG LVL3 (GOWN DISPOSABLE) ×3 IMPLANT
GOWN STRL REUS W/TWL XL LVL3 (GOWN DISPOSABLE) ×3 IMPLANT
KIT ROOM TURNOVER WOR (KITS) ×3 IMPLANT
MANIFOLD NEPTUNE II (INSTRUMENTS) ×3 IMPLANT
PACK CYSTO (CUSTOM PROCEDURE TRAY) ×3 IMPLANT
SYR 20CC LL (SYRINGE) ×3 IMPLANT
TUBE CONNECTING 12'X1/4 (SUCTIONS) ×1
TUBE CONNECTING 12X1/4 (SUCTIONS) ×2 IMPLANT
WATER STERILE IRR 3000ML UROMA (IV SOLUTION) ×3 IMPLANT

## 2016-05-31 NOTE — Op Note (Signed)
Preoperative diagnosis: History of high-grade urothelial carcinoma/carcinoma in situ with recent recurrence.  Postoperative diagnosis: Same  Principal procedure: Cystoscopy, TURBT/bladder biopsy (1 centimeter recurrence).  Surgeon: Diona Fanti  Anesthesia: Gen. with LMA  Complications: None.  Drains: 20 French Foley catheter to leg bag.  Specimens: 1.  Bladder tumor 2.  Bladder biopsies.  Estimated blood loss: 25 mL  Indications: 75 year old male with recent diagnosis of bladder cancer/carcinoma in situ.  He completed induction BCG several weeks ago.  Recent cystoscopy for surveillance revealed that the patient had a papillary recurrence just to the left/lateral to his prior tumor, as well as some raised urothelium around his prior biopsy site.  No other abnormalities were noted within the bladder.  He presents at this time for cystoscopy/TURBT/bladder biopsy  Description of procedure: The patient was properly identified in the holding area.  He received preoperative IV antibiotics.  He was taken the operating room where general anesthetic was administered using the LMA.  He was placed in the dorsolithotomy position.  Genitalia and perineum were prepped and draped.  Proper timeout was performed.  A 23 French panendoscope was advanced to the urethra.  Urethra was free of lesions.  Prostate was minimally to moderately obstructive with trilobar hypertrophy.  The bladder was inspected.  Both the 12 and the 70 lenses were used.  Ureteral orifices were normal in configuration and location.  Mild scattered trabeculations.  There was an 8-10 millimeters papillary recurrence just to the left of the scar from the prior TURBT, which was lateral to the left ureteral orifice.  There was mild raised urothelium just medial to this recurrence, surrounding the area of prior biopsy.  This area of erythema/raised urothelium was perhaps 15 millimeters in size.  No other abnormalities were noted.  I used the cold  cup biopsy forceps, first to excise the papillary recurrence.  This was removed and 1 piece, labeled "bladder tumor".  The cold cup forceps were then used to remove the remaining abnormal urothelium.  This was somewhat scarred in nature, so I needed take fairly deep biopsies with the forceps.  Approximate 4 biopsies were taken from this site, totally excising the abnormal area.  These biopsies were labeled "bladder biopsies".  Following completion of the biopsies, the Bugbee electrode was used to cauterize the biopsy sites, both the base as well as a surrounding urothelium.  Hemostasis was excellent.  At this point, a 81 [place] Foley catheter was placed.  It was hooked to dependent drainage.  It was irrigated-the urine was slightly bloody but flowed well.  This was hooked to a leg bag.  The patient was awakened and taken to the PACU in stable condition, having tolerated the procedure well.

## 2016-05-31 NOTE — Transfer of Care (Signed)
  Last Vitals:  Vitals:   05/31/16 1038  BP: (!) 175/81  Pulse: 82  Resp: 18  Temp: 36.7 C    Last Pain:  Vitals:   05/31/16 1038  TempSrc: Oral      Patients Stated Pain Goal: 5 (05/31/16 1058)  Immediate Anesthesia Transfer of Care Note  Patient: Keith [name]  Procedure(s) Performed: Procedure(s) (LRB): CYSTOSCOPY WITH BIOPSY (N/A)  Patient Location: PACU  Anesthesia Type: General  Level of Consciousness: awake, alert  and oriented  Airway & Oxygen Therapy: Patient Spontanous Breathing and Patient connected to face mask oxygen  Post-op Assessment: Report given to PACU RN and Post -op Vital signs reviewed and stable  Post vital signs: Reviewed and stable  Complications: No apparent anesthesia complications

## 2016-05-31 NOTE — Discharge Instructions (Signed)
Transurethral Resection of Bladder Tumor (TURBT)  Definition:  Transurethral Resection of the Bladder Tumor is a surgical procedure used to diagnose and remove tumors within the bladder. TURBT is the most common treatment for early stage bladder cancer.   General instructions:  Your recent bladder surgery requires very little post hospital care but some definite precautions.  Despite the fact that no skin incisions were used, the area around the tumor removal site is raw and covered with scabs to promote healing and prevent bleeding. Certain precautions are needed to insure that the scabs are not disturbed over the next 2-4 weeks while the healing proceeds.   If the urine is clear enough, you can remove the catheter as instructed on Saturday morning.  Because the raw surface inside your bladder and the irritating effects of urine you may expect frequency of urination and/or urgency (a stronger desire to urinate) and perhaps even getting up at night more often. This will usually resolve or improve slowly over the healing period. You may see some blood in your urine over the first 6 weeks. Do not be alarmed, even if the urine was clear for a while. Get off your feet and drink lots of fluids until clearing occurs. If you start to pass clots or don't improve call us.   Diet:  You may return to your normal diet immediately. Because of the raw surface of your bladder, alcohol, spicy foods, foods high in acid and drinks with caffeine may cause irritation or frequency and should be used in moderation. To keep your urine flowing freely and avoid constipation, drink plenty of fluids during the day (8-10 glasses). Tip: Avoid cranberry juice because it is very acidic.   Activity:  Your physical activity needs to be restricted somewhat.  We suggest that you reduce your activity under the circumstances until the bleeding has stopped. Heavy lifting (greater than 20 lbs.) and heavy exertion should be limited for 2-3  weeks.  Bowels:  It is important to keep your bowels regular during the postoperative period. Straining with bowel movements can cause bleeding. A bowel movement every other day is reasonable. Use a mild laxative if needed, such as milk of magnesia 2-3 tablespoons, or 2 Dulcolax tablets. Call if you continue to have problems. If you had been taking narcotics for pain, before, during or after your surgery, you may be constipated.   Medication:  You should resume your pre-surgery medications unless told not to. In addition you may be given an antibiotic to prevent or treat infection. Antibiotics are not always necessary. All medication should be taken as prescribed until the bottles are finished unless you are having an unusual reaction to one of the drugs.  General Anesthetic, Adult  A doctor specialized in giving anesthesia (anesthesiologist) or a nurse specialized in giving anesthesia (nurse anesthetist) gives medicine that makes you sleep while a procedure is performed (general anesthetic). Once the general anesthetic has been administered, you will be in a sleeplike state in which you feel no pain. After having a general anesthetic you may feel:  Dizzy.  Weak.  Drowsy.  Confused.  These feelings are normal and can be expected to last for up to 24 hours after the procedure.   LET YOUR CAREGIVER KNOW ABOUT:  Allergies you have.  Medications you are taking, including herbs, eye drops, over the counter medications, dietary supplements, and creams.  Previous problems you have had with anesthetics or numbing medicines.  Use of cigarettes, alcohol, or illicit drugs.  Possibility of pregnancy, if this applies.  History of bleeding or blood disorders, including blood clots and clotting disorders.  Previous surgeries you have had and types of anesthetics you have received.  Family medical history, especially anesthetic problems.  Other health problems.   AFTER THE PROCEDURE  After surgery, you  will be taken to the recovery area where a nurse will monitor your progress. You will be allowed to go home when you are awake, stable, taking fluids well, and without serious pain or complications.  For the first 24 hours following an anesthetic:  Have a responsible person with you.  Do not drive a car. If you are alone, do not take public transportation.  Do not engage in strenuous activity. You may usually resume normal activities the next day, or as advised by your caregiver.  Do not drink alcohol.  Do not take medicine that has not been prescribed by your caregiver.  Do not sign important papers or make important decisions as your judgement may be impaired.  You may resume a normal diet as directed.  Change bandages (dressings) as directed.  Only take over-the-counter or prescription medicines for pain, discomfort, or fever as directed by your caregiver.  If you have questions or problems that seem related to the anesthetic, call the hospital and ask for the anesthetist, anesthesiologist, or anesthesia department.   SEEK IMMEDIATE MEDICAL CARE IF:  You develop a rash.  You have difficulty breathing.  You have chest pain.  You have allergic problems.  You have uncontrolled nausea.  You have uncontrolled vomiting.  You develop any serious bleeding, especially from the incision site.   Post Anesthesia Home Care Instructions  Activity: Get plenty of rest for the remainder of the day. A responsible adult should stay with you for 24 hours following the procedure.  For the next 24 hours, DO NOT: -Drive a car -Paediatric nurse -Drink alcoholic beverages -Take any medication unless instructed by your physician -Make any legal decisions or sign important papers.  Meals: Start with liquid foods such as gelatin or soup. Progress to regular foods as tolerated. Avoid greasy, spicy, heavy foods. If nausea and/or vomiting occur, drink only clear liquids until the nausea and/or vomiting  subsides. Call your physician if vomiting continues.  Special Instructions/Symptoms: Your throat may feel dry or sore from the anesthesia or the breathing tube placed in your throat during surgery. If this causes discomfort, gargle with warm salt water. The discomfort should disappear within 24 hours.  If you had a scopolamine patch placed behind your ear for the management of post- operative nausea and/or vomiting:  1. The medication in the patch is effective for 72 hours, after which it should be removed.  Wrap patch in a tissue and discard in the trash. Wash hands thoroughly with soap and water. 2. You may remove the patch earlier than 72 hours if you experience unpleasant side effects which may include dry mouth, dizziness or visual disturbances. 3. Avoid touching the patch. Wash your hands with soap and water after contact with the patch.

## 2016-05-31 NOTE — Anesthesia Preprocedure Evaluation (Signed)
Anesthesia Evaluation  Patient identified by MRN, date of birth, ID band Patient awake    Reviewed: Allergy & Precautions, NPO status , Patient's Chart, lab work & pertinent test results  Airway Mallampati: II  TM Distance: >3 FB Neck ROM: Full    Dental no notable dental hx.    Pulmonary neg pulmonary ROS, former smoker,    Pulmonary exam normal breath sounds clear to auscultation       Cardiovascular negative cardio ROS Normal cardiovascular exam Rhythm:Regular Rate:Normal     Neuro/Psych negative neurological ROS  negative psych ROS   GI/Hepatic negative GI ROS, Neg liver ROS,   Endo/Other  diabetesHypothyroidism   Renal/GU negative Renal ROS  negative genitourinary   Musculoskeletal negative musculoskeletal ROS (+)   Abdominal   Peds negative pediatric ROS (+)  Hematology negative hematology ROS (+)   Anesthesia Other Findings   Reproductive/Obstetrics negative OB ROS                             Anesthesia Physical Anesthesia Plan  ASA: II  Anesthesia Plan: General   Post-op Pain Management:    Induction: Intravenous  Airway Management Planned: LMA  Additional Equipment:   Intra-op Plan:   Post-operative Plan: Extubation in OR  Informed Consent: I have reviewed the patients History and Physical, chart, labs and discussed the procedure including the risks, benefits and alternatives for the proposed anesthesia with the patient or authorized representative who has indicated his/her understanding and acceptance.   Dental advisory given  Plan Discussed with: CRNA and Surgeon  Anesthesia Plan Comments:         Anesthesia Quick Evaluation

## 2016-05-31 NOTE — Anesthesia Procedure Notes (Signed)
Procedure Name: LMA Insertion Date/Time: 05/31/2016 12:10 PM Performed by: Myrtie Soman Pre-anesthesia Checklist: Patient identified, Emergency Drugs available, Suction available and Patient being monitored Patient Re-evaluated:Patient Re-evaluated prior to inductionOxygen Delivery Method: Circle system utilized Preoxygenation: Pre-oxygenation with 100% oxygen Intubation Type: IV induction Ventilation: Mask ventilation without difficulty LMA: LMA inserted LMA Size: 5.0 Number of attempts: 1 Airway Equipment and Method: Bite block Placement Confirmation: positive ETCO2 Tube secured with: Tape Dental Injury: Teeth and Oropharynx as per pre-operative assessment

## 2016-06-03 ENCOUNTER — Encounter (HOSPITAL_BASED_OUTPATIENT_CLINIC_OR_DEPARTMENT_OTHER): Payer: Self-pay | Admitting: Urology

## 2016-06-03 LAB — POCT I-STAT, CHEM 8
BUN: 16 mg/dL (ref 6–20)
CREATININE: 0.7 mg/dL (ref 0.61–1.24)
Calcium, Ion: 1.22 mmol/L (ref 1.15–1.40)
Chloride: 104 mmol/L (ref 101–111)
Glucose, Bld: 124 mg/dL — ABNORMAL HIGH (ref 65–99)
HEMATOCRIT: 39 % (ref 39.0–52.0)
HEMOGLOBIN: 13.3 g/dL (ref 13.0–17.0)
POTASSIUM: 3.8 mmol/L (ref 3.5–5.1)
SODIUM: 143 mmol/L (ref 135–145)
TCO2: 27 mmol/L (ref 0–100)

## 2016-06-04 DIAGNOSIS — C44311 Basal cell carcinoma of skin of nose: Secondary | ICD-10-CM | POA: Diagnosis not present

## 2016-06-04 DIAGNOSIS — L821 Other seborrheic keratosis: Secondary | ICD-10-CM | POA: Diagnosis not present

## 2016-06-04 DIAGNOSIS — D485 Neoplasm of uncertain behavior of skin: Secondary | ICD-10-CM | POA: Diagnosis not present

## 2016-06-05 NOTE — Anesthesia Postprocedure Evaluation (Signed)
Anesthesia Post Note  Patient: Keith [name]  Procedure(s) Performed: Procedure(s) (LRB): CYSTOSCOPY WITH BIOPSY (N/A)  Patient location during evaluation: PACU Anesthesia Type: General Level of consciousness: awake and alert Pain management: pain level controlled Vital Signs Assessment: post-procedure vital signs reviewed and stable Respiratory status: spontaneous breathing, nonlabored ventilation, respiratory function stable and patient connected to nasal cannula oxygen Cardiovascular status: blood pressure returned to baseline and stable Postop Assessment: no signs of nausea or vomiting Anesthetic complications: no    Last Vitals:  Vitals:   05/31/16 1315 05/31/16 1430  BP: (!) 168/77 (!) 170/77  Pulse: (!) 59 63  Resp: 11 16  Temp:  36.8 C    Last Pain:  Vitals:   05/31/16 1038  TempSrc: Oral                 Wynonna Fitzhenry S

## 2016-07-08 DIAGNOSIS — D090 Carcinoma in situ of bladder: Secondary | ICD-10-CM | POA: Diagnosis not present

## 2016-07-08 DIAGNOSIS — C678 Malignant neoplasm of overlapping sites of bladder: Secondary | ICD-10-CM | POA: Diagnosis not present

## 2016-07-16 ENCOUNTER — Emergency Department (HOSPITAL_COMMUNITY)
Admission: EM | Admit: 2016-07-16 | Discharge: 2016-07-16 | Disposition: A | Discharge status: Home / Self Care | Payer: Medicare Other | Attending: Emergency Medicine | Admitting: Emergency Medicine

## 2016-07-16 ENCOUNTER — Emergency Department (HOSPITAL_COMMUNITY): Payer: Medicare Other

## 2016-07-16 DIAGNOSIS — Z8551 Personal history of malignant neoplasm of bladder: Secondary | ICD-10-CM | POA: Insufficient documentation

## 2016-07-16 DIAGNOSIS — C678 Malignant neoplasm of overlapping sites of bladder: Secondary | ICD-10-CM | POA: Diagnosis not present

## 2016-07-16 DIAGNOSIS — R791 Abnormal coagulation profile: Secondary | ICD-10-CM | POA: Diagnosis not present

## 2016-07-16 DIAGNOSIS — E039 Hypothyroidism, unspecified: Secondary | ICD-10-CM | POA: Insufficient documentation

## 2016-07-16 DIAGNOSIS — Z5111 Encounter for antineoplastic chemotherapy: Secondary | ICD-10-CM | POA: Diagnosis not present

## 2016-07-16 DIAGNOSIS — Z87891 Personal history of nicotine dependence: Secondary | ICD-10-CM | POA: Diagnosis not present

## 2016-07-16 DIAGNOSIS — R509 Fever, unspecified: Secondary | ICD-10-CM

## 2016-07-16 LAB — URINALYSIS, ROUTINE W REFLEX MICROSCOPIC
Bilirubin Urine: NEGATIVE
GLUCOSE, UA: NEGATIVE mg/dL
Ketones, ur: NEGATIVE mg/dL
NITRITE: NEGATIVE
PH: 5 (ref 5.0–8.0)
PROTEIN: 100 mg/dL — AB
SPECIFIC GRAVITY, URINE: 1.019 (ref 1.005–1.030)
SQUAMOUS EPITHELIAL / LPF: NONE SEEN

## 2016-07-16 LAB — CBC WITH DIFFERENTIAL/PLATELET
BASOS ABS: 0 10*3/uL (ref 0.0–0.1)
BASOS PCT: 0 %
EOS ABS: 0.2 10*3/uL (ref 0.0–0.7)
Eosinophils Relative: 3 %
HEMATOCRIT: 39.8 % (ref 39.0–52.0)
HEMOGLOBIN: 13.3 g/dL (ref 13.0–17.0)
Lymphocytes Relative: 9 %
Lymphs Abs: 0.8 10*3/uL (ref 0.7–4.0)
MCH: 30.1 pg (ref 26.0–34.0)
MCHC: 33.4 g/dL (ref 30.0–36.0)
MCV: 90 fL (ref 78.0–100.0)
MONOS PCT: 7 %
Monocytes Absolute: 0.7 10*3/uL (ref 0.1–1.0)
NEUTROS ABS: 7.6 10*3/uL (ref 1.7–7.7)
NEUTROS PCT: 81 %
Platelets: 201 10*3/uL (ref 150–400)
RBC: 4.42 MIL/uL (ref 4.22–5.81)
RDW: 13.1 % (ref 11.5–15.5)
WBC: 9.3 10*3/uL (ref 4.0–10.5)

## 2016-07-16 LAB — PROTIME-INR
INR: 1.02
PROTHROMBIN TIME: 13.4 s (ref 11.4–15.2)

## 2016-07-16 LAB — COMPREHENSIVE METABOLIC PANEL
ALBUMIN: 4.9 g/dL (ref 3.5–5.0)
ALK PHOS: 51 U/L (ref 38–126)
ALT: 29 U/L (ref 17–63)
ANION GAP: 9 (ref 5–15)
AST: 29 U/L (ref 15–41)
BILIRUBIN TOTAL: 0.8 mg/dL (ref 0.3–1.2)
BUN: 21 mg/dL — ABNORMAL HIGH (ref 6–20)
CALCIUM: 9.3 mg/dL (ref 8.9–10.3)
CO2: 25 mmol/L (ref 22–32)
Chloride: 101 mmol/L (ref 101–111)
Creatinine, Ser: 0.91 mg/dL (ref 0.61–1.24)
GFR calc non Af Amer: >60 mL/min (ref >60)
GLUCOSE: 169 mg/dL — AB (ref 65–99)
POTASSIUM: 3.9 mmol/L (ref 3.5–5.1)
SODIUM: 135 mmol/L (ref 135–145)
TOTAL PROTEIN: 7.5 g/dL (ref 6.5–8.1)

## 2016-07-16 LAB — I-STAT CG4 LACTIC ACID, ED: Lactic Acid, Venous: 1.31 mmol/L (ref 0.5–1.9)

## 2016-07-16 NOTE — ED Provider Notes (Signed)
[place] DEPT Provider Note   CSN: [id number] Arrival date & time: 07/16/16  1925     History   Chief Complaint Chief Complaint  Patient presents with  . Fever  . Cancer    HPI Keith [name] is a 75 y.o. male.  Patient is a 75 year old male with history of diabetes and bladder cancer. He presents for evaluation of fever. He received a chemotherapy treatment by Dr. Diona Fanti. This afternoon. Shortly after returning home he developed chills and a fever to 102.9. He is currently febrile in the emergency department with a temperature of 100. Patient denies any specific complaints that would explain his fever. Specifically, he is having no chest congestion or productive cough, bowel or bladder issues, urinary complaints, or other symptoms.    Fever      Past Medical History:  Diagnosis Date  . BPH (benign prostatic hypertrophy)   . Full dentures   . Glaucoma, right eye   . Hyperlipidemia   . Hypothyroidism   . 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 Lower urinary tract symptoms (LUTS)   . Peripheral neuropathy ([place])   . Type 2 diabetes mellitus ([place])   . Wears glasses     There are no active problems to display for this patient.   Past Surgical History:  Procedure Laterality Date  . ANTERIOR CERVICAL DECOMP/DISCECTOMY FUSION  08-15-2000   C5 -- C6  . ANTERIOR CERVICAL DECOMP/DISCECTOMY FUSION  03-04-2008   C6 -- C7  . CYSTOSCOPY WITH BIOPSY N/A 01/29/2016   Procedure: CYSTOSCOPY WITH BIOPSY;  Surgeon: Franchot Gallo, MD;  Location: [facility];  Service: Urology;  Laterality: N/A;  . CYSTOSCOPY WITH BIOPSY N/A 05/31/2016   Procedure: CYSTOSCOPY WITH BIOPSY;  Surgeon: Franchot Gallo, MD;  Location: [facility];  Service: Urology;  Laterality: N/A;  . GLAUCOMA SURGERY  1998 and 2000       Home Medications    Prior to Admission  medications   Medication Sig Start Date End Date Taking? Authorizing Provider  atorvastatin (LIPITOR) 20 MG tablet Take 20 mg by mouth every morning.    Historical Provider, MD  carteolol (OCUPRESS) 1 % ophthalmic solution Place 1 drop into the right eye every morning.    Historical Provider, MD  gabapentin (NEURONTIN) 600 MG tablet Take 600 mg by mouth 3 (three) times daily.    Historical Provider, MD  levothyroxine (SYNTHROID, LEVOTHROID) 100 MCG tablet Take 100 mcg by mouth at bedtime.     Historical Provider, MD  meloxicam (MOBIC) 15 MG tablet Take 15 mg by mouth daily.    Historical Provider, MD  metFORMIN (GLUCOPHAGE) 500 MG tablet Take 500 mg by mouth 2 (two) times daily with a meal.    Historical Provider, MD  sulfamethoxazole-trimethoprim (BACTRIM DS,SEPTRA DS) 800-160 MG tablet Take 1 tablet by mouth 2 (two) times daily. 05/31/16   Franchot Gallo, MD  tamsulosin (FLOMAX) 0.4 MG CAPS capsule Take 0.4 mg by mouth every morning.     Historical Provider, MD  vitamin B-12 (CYANOCOBALAMIN) 1000 MCG tablet Take 1,000 mcg by mouth daily.    Historical Provider, MD  vitamin C (ASCORBIC ACID) 500 MG tablet Take 500 mg by mouth daily.    Historical Provider, MD    Family History Family History  Problem Relation Age of Onset  . Heart disease Mother   . Heart disease Father  Social History Social History  Substance Use Topics  . Smoking status: Former Smoker    Years: 50.00    Types: Cigarettes    Quit date: 06/27/2004  . Smokeless tobacco: Never Used  . Alcohol use No     Allergies   Patient has no known allergies.   Review of Systems Review of Systems  Constitutional: Positive for fever.  All other systems reviewed and are negative.    Physical Exam Updated Vital Signs BP 143/73 (BP Location: Left Arm)   Pulse 93   Temp 100 F (37.8 C) (Oral)   Resp 18   Wt 196 lb 11.2 oz (89.2 kg)   SpO2 94%   BMI 28.22 kg/m   Physical Exam  Constitutional: He is  oriented to person, place, and time. He appears well-developed and well-nourished. No distress.  HENT:  Head: Normocephalic and atraumatic.  Mouth/Throat: Oropharynx is clear and moist.  Neck: Normal range of motion. Neck supple.  Cardiovascular: Normal rate and regular rhythm.  Exam reveals no friction rub.   No murmur heard. Pulmonary/Chest: Effort normal and breath sounds normal. No respiratory distress. He has no wheezes. He has no rales.  Abdominal: Soft. Bowel sounds are normal. He exhibits no distension. There is no tenderness.  Musculoskeletal: Normal range of motion. He exhibits no edema.  Neurological: He is alert and oriented to person, place, and time. Coordination normal.  Skin: Skin is warm and dry. He is not diaphoretic.  Nursing note and vitals reviewed.    ED Treatments / Results  Labs (all labs ordered are listed, but only abnormal results are displayed) Labs Reviewed  CULTURE, BLOOD (ROUTINE X 2)  CULTURE, BLOOD (ROUTINE X 2)  URINE CULTURE  COMPREHENSIVE METABOLIC PANEL  CBC WITH DIFFERENTIAL/PLATELET  PROTIME-INR  URINALYSIS, ROUTINE W REFLEX MICROSCOPIC  I-STAT CG4 LACTIC ACID, ED    EKG  EKG Interpretation None       Radiology Dg Chest 2 View  Result Date: 07/16/2016 CLINICAL DATA:  Fever since earlier this afternoon. Chemotherapy morning. History of bladder cancer and diabetes. EXAM: CHEST  2 VIEW COMPARISON:  11/08/2015 and 02/21/2011 FINDINGS: The heart and mediastinal contours are normal in size. There is aortic atherosclerosis. No pneumonic consolidation, CHF, effusion or pneumothorax. Lower ACDF. IMPRESSION: No active cardiopulmonary disease. Electronically Signed   By: Ashley Royalty M.D.   On: 07/16/2016 20:03    Procedures Procedures (including critical care time)  Medications Ordered in ED Medications - No data to display   Initial Impression / Assessment and Plan / ED Course  I have reviewed the triage vital signs and the nursing  notes.  Pertinent labs & imaging results that were available during my care of the patient were reviewed by me and considered in my medical decision making (see chart for details).  Clinical Course     Patient presents with complaints of fever. He just underwent chemo treatment to his bladder this afternoon. His laboratory studies are unremarkable and urinalysis reveals white cells, however is nitrite negative. He is now afebrile. I discussed this case with Dr. Tresa Moore from urology who feels as though the patient is appropriate for discharge with strict return precautions. Blood cultures and urine cultures pending.  Final Clinical Impressions(s) / ED Diagnoses   Final diagnoses:  None    New Prescriptions New Prescriptions   No medications on file     Veryl Speak, MD 07/17/16 0017

## 2016-07-16 NOTE — ED Triage Notes (Signed)
Pt states that he had chemo today for bladder CA around 1130 and at approx. 1830 he started having fever and chills. Dr. Alan Ripper PA told wife to bring pt in. Alert and oriented.

## 2016-07-16 NOTE — Discharge Instructions (Signed)
We will call you if your cultures indicate you require further treatment.  Return to the emergency department if you develop difficulty breathing, chest pain, dizziness, recurrent fevers, or other new and concerning symptoms.  Follow-up with your urologist in the next 2-3 days.

## 2016-07-18 LAB — URINE CULTURE

## 2016-07-21 LAB — CULTURE, BLOOD (ROUTINE X 2)
CULTURE: NO GROWTH
Culture: NO GROWTH

## 2016-10-21 DIAGNOSIS — D3500 Benign neoplasm of unspecified adrenal gland: Secondary | ICD-10-CM | POA: Diagnosis not present

## 2016-10-21 DIAGNOSIS — N401 Enlarged prostate with lower urinary tract symptoms: Secondary | ICD-10-CM | POA: Diagnosis not present

## 2016-10-21 DIAGNOSIS — C678 Malignant neoplasm of overlapping sites of bladder: Secondary | ICD-10-CM | POA: Diagnosis not present

## 2016-11-11 DIAGNOSIS — E789 Disorder of lipoprotein metabolism, unspecified: Secondary | ICD-10-CM | POA: Diagnosis not present

## 2016-11-11 DIAGNOSIS — E118 Type 2 diabetes mellitus with unspecified complications: Secondary | ICD-10-CM | POA: Diagnosis not present

## 2016-11-11 DIAGNOSIS — Z125 Encounter for screening for malignant neoplasm of prostate: Secondary | ICD-10-CM | POA: Diagnosis not present

## 2016-11-11 DIAGNOSIS — E0789 Other specified disorders of thyroid: Secondary | ICD-10-CM | POA: Diagnosis not present

## 2016-11-11 DIAGNOSIS — Z0000 Encounter for general adult medical examination without abnormal findings: Secondary | ICD-10-CM | POA: Diagnosis not present

## 2016-11-19 DIAGNOSIS — E032 Hypothyroidism due to medicaments and other exogenous substances: Secondary | ICD-10-CM | POA: Diagnosis not present

## 2016-11-19 DIAGNOSIS — E118 Type 2 diabetes mellitus with unspecified complications: Secondary | ICD-10-CM | POA: Diagnosis not present

## 2016-11-19 DIAGNOSIS — M549 Dorsalgia, unspecified: Secondary | ICD-10-CM | POA: Diagnosis not present

## 2016-11-26 DIAGNOSIS — Z1211 Encounter for screening for malignant neoplasm of colon: Secondary | ICD-10-CM | POA: Diagnosis not present

## 2016-11-26 DIAGNOSIS — Z1212 Encounter for screening for malignant neoplasm of rectum: Secondary | ICD-10-CM | POA: Diagnosis not present

## 2016-12-11 DIAGNOSIS — Z86010 Personal history of colonic polyps: Secondary | ICD-10-CM | POA: Diagnosis not present

## 2016-12-11 DIAGNOSIS — R195 Other fecal abnormalities: Secondary | ICD-10-CM | POA: Diagnosis not present

## 2016-12-11 DIAGNOSIS — K219 Gastro-esophageal reflux disease without esophagitis: Secondary | ICD-10-CM | POA: Diagnosis not present

## 2016-12-19 DIAGNOSIS — Z86010 Personal history of colonic polyps: Secondary | ICD-10-CM | POA: Diagnosis not present

## 2016-12-19 DIAGNOSIS — K635 Polyp of colon: Secondary | ICD-10-CM | POA: Diagnosis not present

## 2016-12-19 DIAGNOSIS — K5730 Diverticulosis of large intestine without perforation or abscess without bleeding: Secondary | ICD-10-CM | POA: Diagnosis not present

## 2016-12-19 DIAGNOSIS — D123 Benign neoplasm of transverse colon: Secondary | ICD-10-CM | POA: Diagnosis not present

## 2016-12-19 DIAGNOSIS — K5520 Angiodysplasia of colon without hemorrhage: Secondary | ICD-10-CM | POA: Diagnosis not present

## 2017-01-31 DIAGNOSIS — C678 Malignant neoplasm of overlapping sites of bladder: Secondary | ICD-10-CM | POA: Diagnosis not present

## 2017-02-11 DIAGNOSIS — E789 Disorder of lipoprotein metabolism, unspecified: Secondary | ICD-10-CM | POA: Diagnosis not present

## 2017-02-11 DIAGNOSIS — E118 Type 2 diabetes mellitus with unspecified complications: Secondary | ICD-10-CM | POA: Diagnosis not present

## 2017-08-12 DIAGNOSIS — R399 Unspecified symptoms and signs involving the genitourinary system: Secondary | ICD-10-CM

## 2017-08-12 DIAGNOSIS — J189 Pneumonia, unspecified organism: Secondary | ICD-10-CM

## 2017-08-12 HISTORY — DX: Pneumonia, unspecified organism: J18.9

## 2017-08-12 HISTORY — DX: Unspecified symptoms and signs involving the genitourinary system: R39.9

## 2017-09-19 IMAGING — CR DG CHEST 2V
2 series · 2 of 2 positions shown · non-contrast
Comparison: 11/08/2015 and 02/21/2011

CLINICAL DATA: Fever since earlier this afternoon. Chemotherapy
morning. History of bladder cancer and diabetes.

EXAM:
CHEST  2 VIEW

[w chest pa]
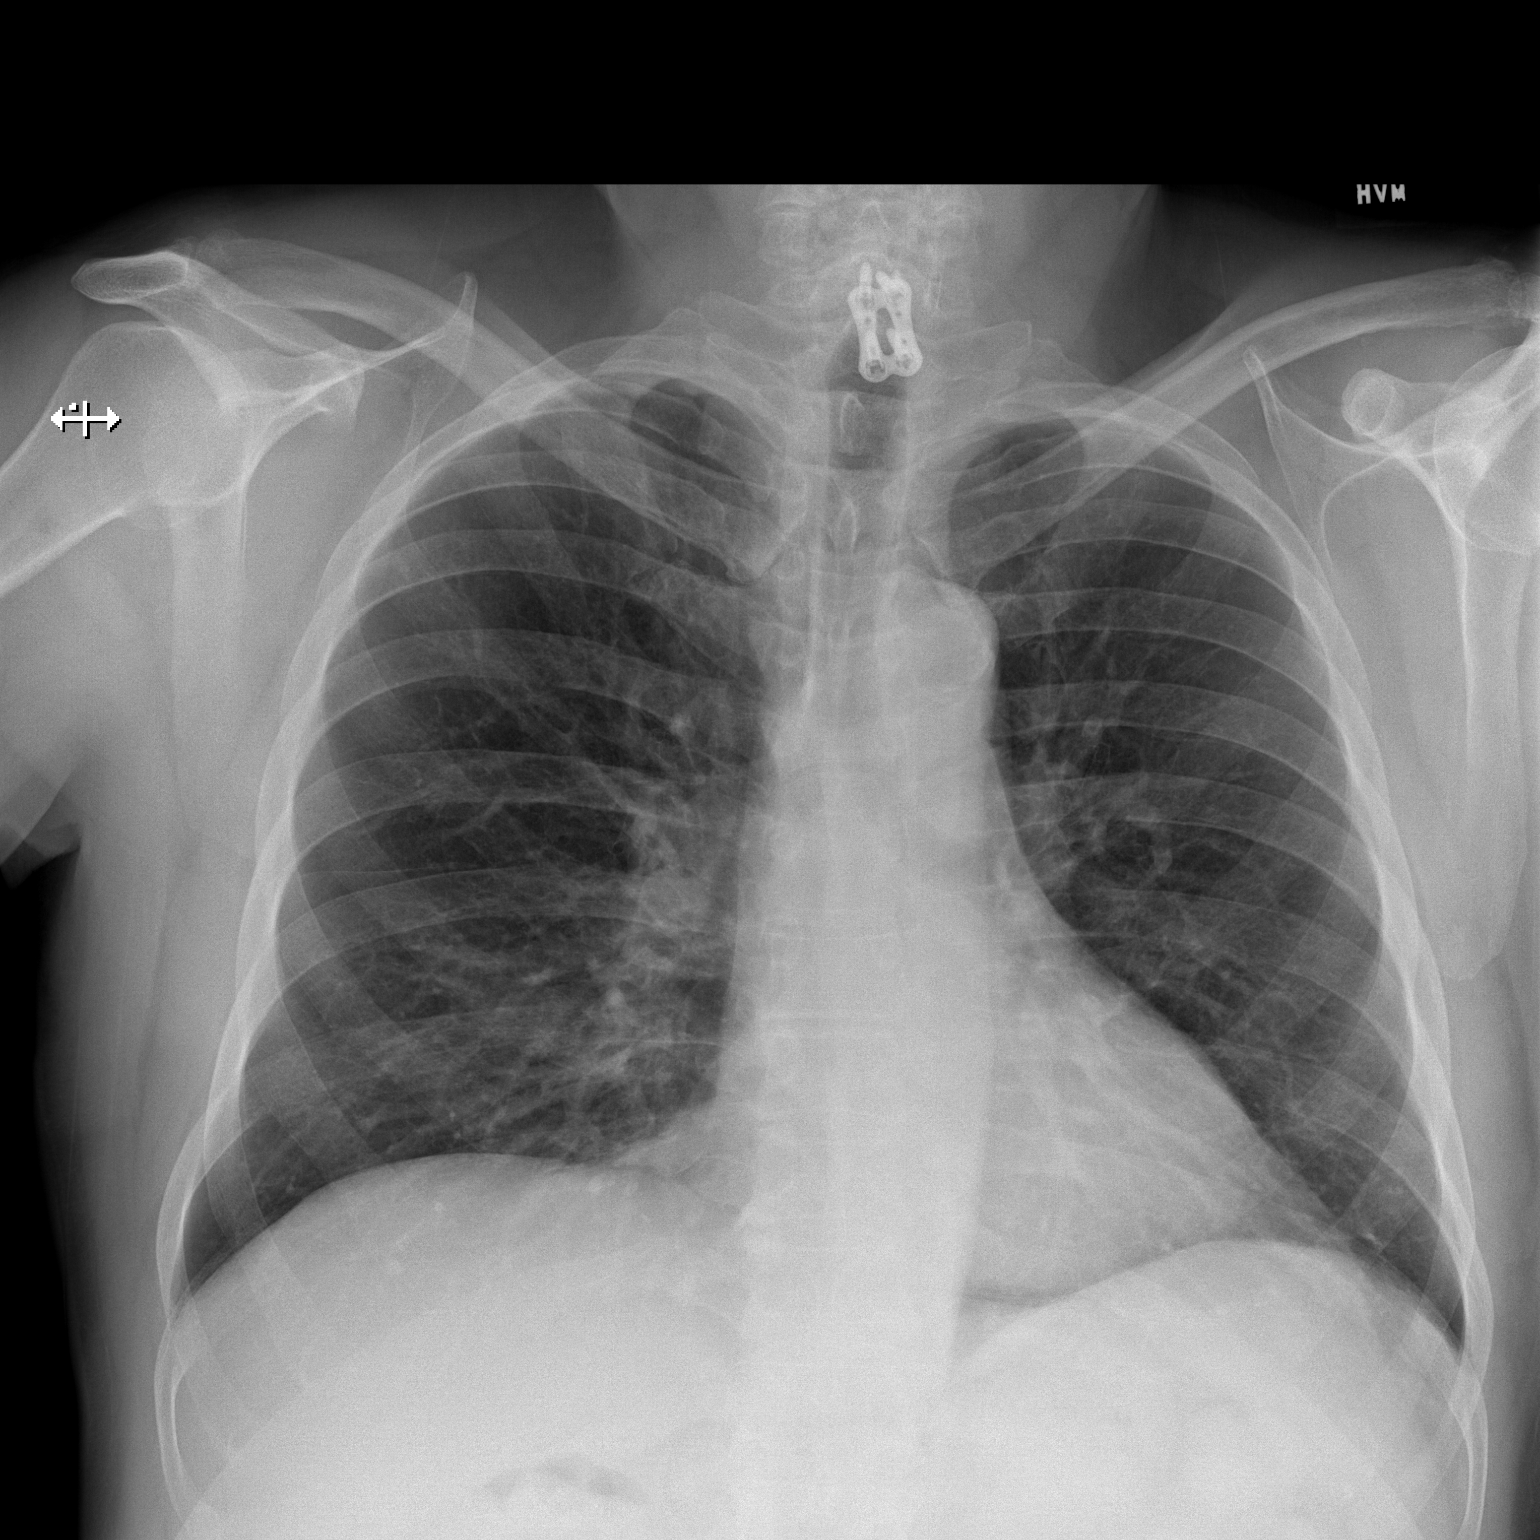

[w chest lat]
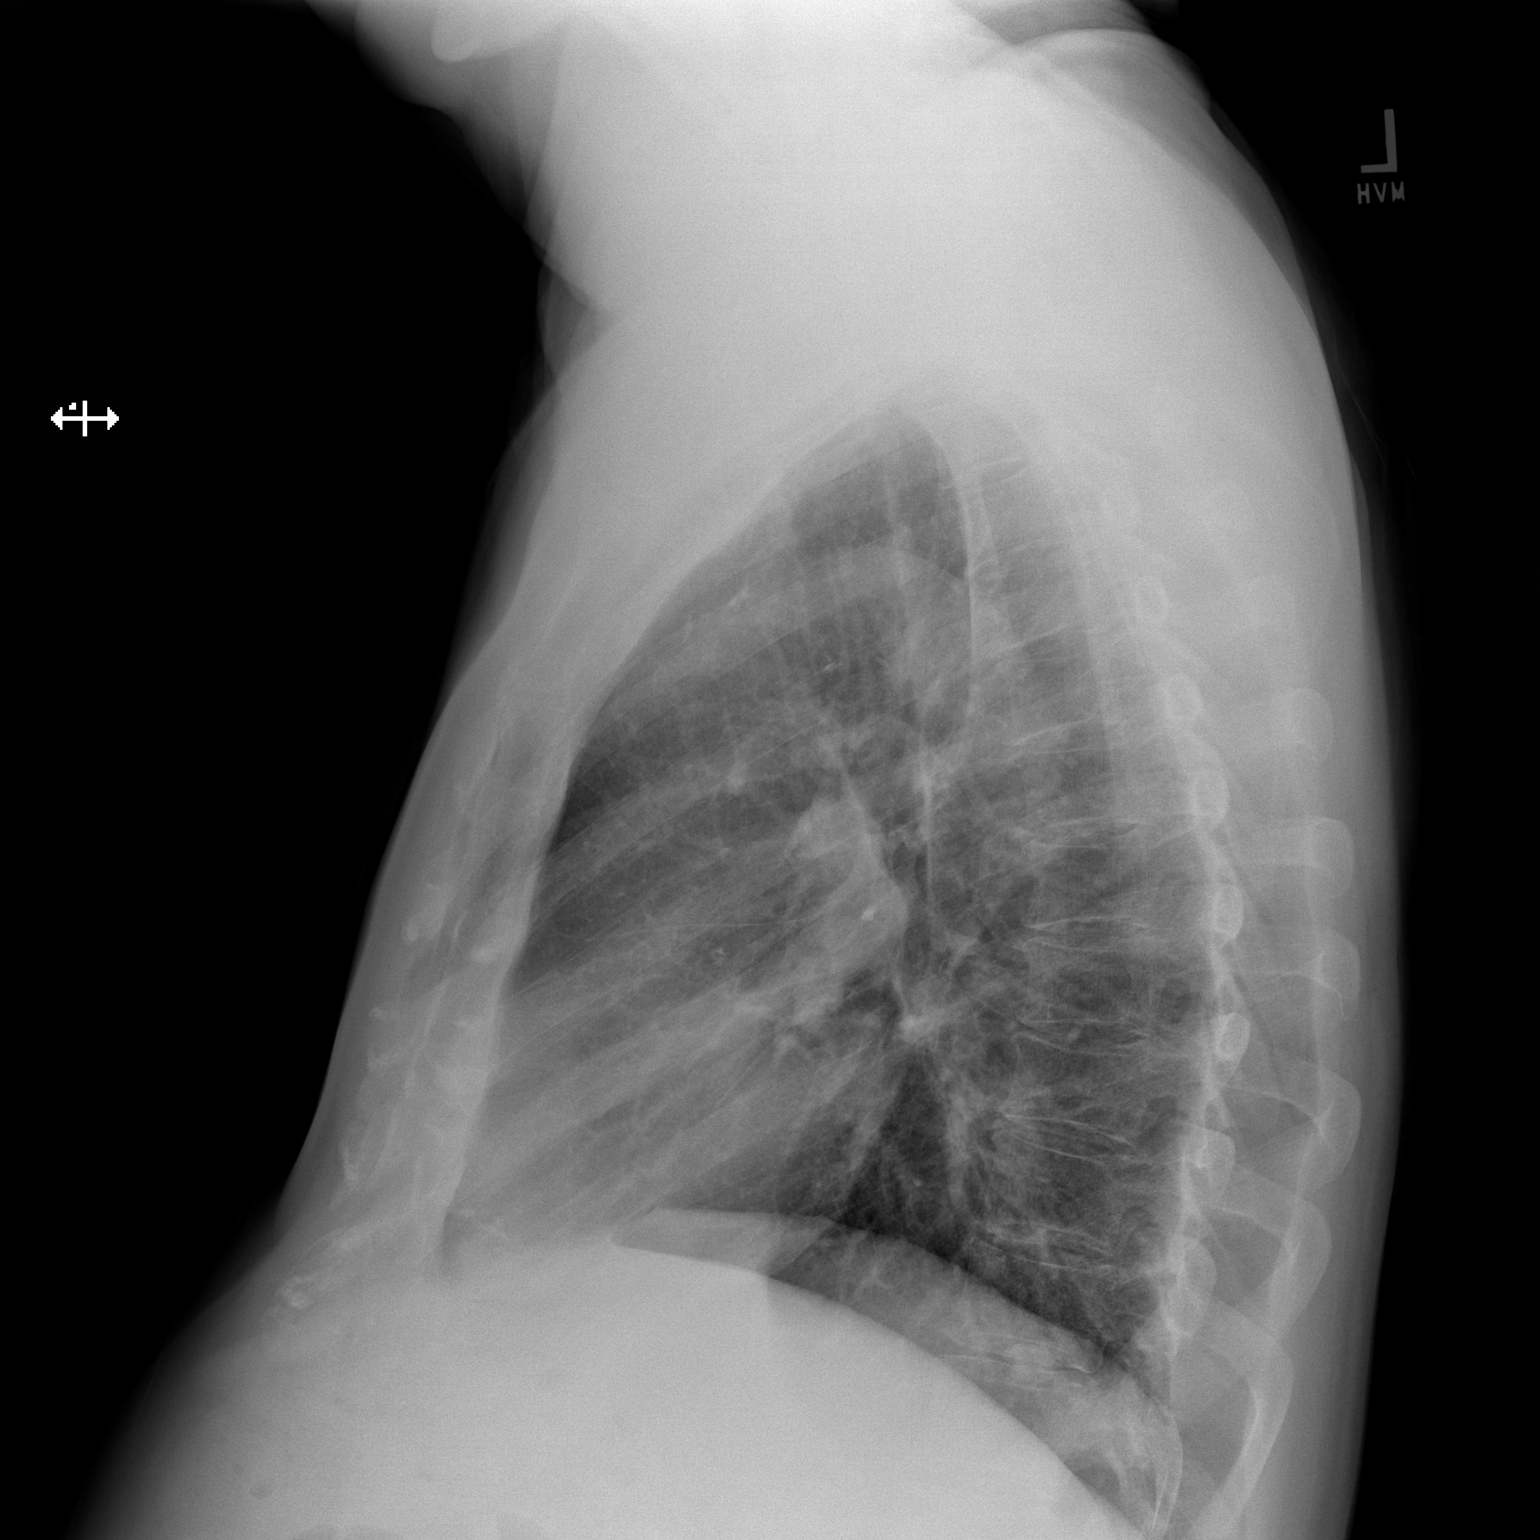

[2 of 2 positions shown; findings below may reference images not displayed]

FINDINGS: The heart and mediastinal contours are normal in size. There is
aortic atherosclerosis. No pneumonic consolidation, CHF, effusion or
pneumothorax. Lower ACDF.
IMPRESSION: No active cardiopulmonary disease.

## 2017-11-24 DIAGNOSIS — H2512 Age-related nuclear cataract, left eye: Secondary | ICD-10-CM | POA: Diagnosis not present

## 2017-11-24 DIAGNOSIS — H524 Presbyopia: Secondary | ICD-10-CM | POA: Diagnosis not present

## 2017-11-24 DIAGNOSIS — H26491 Other secondary cataract, right eye: Secondary | ICD-10-CM | POA: Diagnosis not present

## 2017-11-24 DIAGNOSIS — H401111 Primary open-angle glaucoma, right eye, mild stage: Secondary | ICD-10-CM | POA: Diagnosis not present

## 2017-11-25 DIAGNOSIS — E789 Disorder of lipoprotein metabolism, unspecified: Secondary | ICD-10-CM | POA: Diagnosis not present

## 2017-11-25 DIAGNOSIS — E0789 Other specified disorders of thyroid: Secondary | ICD-10-CM | POA: Diagnosis not present

## 2017-11-25 DIAGNOSIS — E118 Type 2 diabetes mellitus with unspecified complications: Secondary | ICD-10-CM | POA: Diagnosis not present

## 2017-11-25 DIAGNOSIS — Z125 Encounter for screening for malignant neoplasm of prostate: Secondary | ICD-10-CM | POA: Diagnosis not present

## 2017-11-25 DIAGNOSIS — Z0000 Encounter for general adult medical examination without abnormal findings: Secondary | ICD-10-CM | POA: Diagnosis not present

## 2017-12-01 DIAGNOSIS — E1142 Type 2 diabetes mellitus with diabetic polyneuropathy: Secondary | ICD-10-CM | POA: Diagnosis not present

## 2017-12-01 DIAGNOSIS — M545 Low back pain: Secondary | ICD-10-CM | POA: Diagnosis not present

## 2017-12-01 DIAGNOSIS — E118 Type 2 diabetes mellitus with unspecified complications: Secondary | ICD-10-CM | POA: Diagnosis not present

## 2017-12-01 DIAGNOSIS — Z79899 Other long term (current) drug therapy: Secondary | ICD-10-CM | POA: Diagnosis not present

## 2017-12-01 DIAGNOSIS — Z0000 Encounter for general adult medical examination without abnormal findings: Secondary | ICD-10-CM | POA: Diagnosis not present

## 2017-12-10 DIAGNOSIS — C678 Malignant neoplasm of overlapping sites of bladder: Secondary | ICD-10-CM | POA: Diagnosis not present

## 2017-12-15 DIAGNOSIS — I10 Essential (primary) hypertension: Secondary | ICD-10-CM | POA: Diagnosis not present

## 2018-01-08 DIAGNOSIS — I10 Essential (primary) hypertension: Secondary | ICD-10-CM | POA: Diagnosis not present

## 2018-01-15 DIAGNOSIS — E789 Disorder of lipoprotein metabolism, unspecified: Secondary | ICD-10-CM | POA: Diagnosis not present

## 2018-01-15 DIAGNOSIS — E1142 Type 2 diabetes mellitus with diabetic polyneuropathy: Secondary | ICD-10-CM | POA: Diagnosis not present

## 2018-01-15 DIAGNOSIS — Z79899 Other long term (current) drug therapy: Secondary | ICD-10-CM | POA: Diagnosis not present

## 2018-01-15 DIAGNOSIS — E118 Type 2 diabetes mellitus with unspecified complications: Secondary | ICD-10-CM | POA: Diagnosis not present

## 2018-01-15 DIAGNOSIS — I10 Essential (primary) hypertension: Secondary | ICD-10-CM | POA: Diagnosis not present

## 2018-04-16 DIAGNOSIS — R05 Cough: Secondary | ICD-10-CM | POA: Diagnosis not present

## 2018-04-16 DIAGNOSIS — I10 Essential (primary) hypertension: Secondary | ICD-10-CM | POA: Diagnosis not present

## 2018-04-16 DIAGNOSIS — E118 Type 2 diabetes mellitus with unspecified complications: Secondary | ICD-10-CM | POA: Diagnosis not present

## 2018-04-23 DIAGNOSIS — I10 Essential (primary) hypertension: Secondary | ICD-10-CM | POA: Diagnosis not present

## 2018-04-23 DIAGNOSIS — Z23 Encounter for immunization: Secondary | ICD-10-CM | POA: Diagnosis not present

## 2018-04-23 DIAGNOSIS — Z79899 Other long term (current) drug therapy: Secondary | ICD-10-CM | POA: Diagnosis not present

## 2018-04-23 DIAGNOSIS — E118 Type 2 diabetes mellitus with unspecified complications: Secondary | ICD-10-CM | POA: Diagnosis not present

## 2018-04-23 DIAGNOSIS — J309 Allergic rhinitis, unspecified: Secondary | ICD-10-CM | POA: Diagnosis not present

## 2018-04-23 DIAGNOSIS — M545 Low back pain: Secondary | ICD-10-CM | POA: Diagnosis not present

## 2018-04-23 DIAGNOSIS — E1142 Type 2 diabetes mellitus with diabetic polyneuropathy: Secondary | ICD-10-CM | POA: Diagnosis not present

## 2018-05-27 DIAGNOSIS — I10 Essential (primary) hypertension: Secondary | ICD-10-CM | POA: Diagnosis not present

## 2018-05-27 DIAGNOSIS — E118 Type 2 diabetes mellitus with unspecified complications: Secondary | ICD-10-CM | POA: Diagnosis not present

## 2018-06-15 DIAGNOSIS — H401111 Primary open-angle glaucoma, right eye, mild stage: Secondary | ICD-10-CM | POA: Diagnosis not present

## 2018-06-17 DIAGNOSIS — C678 Malignant neoplasm of overlapping sites of bladder: Secondary | ICD-10-CM | POA: Diagnosis not present

## 2018-07-23 DIAGNOSIS — E789 Disorder of lipoprotein metabolism, unspecified: Secondary | ICD-10-CM | POA: Diagnosis not present

## 2018-07-23 DIAGNOSIS — E118 Type 2 diabetes mellitus with unspecified complications: Secondary | ICD-10-CM | POA: Diagnosis not present

## 2018-07-30 DIAGNOSIS — E789 Disorder of lipoprotein metabolism, unspecified: Secondary | ICD-10-CM | POA: Diagnosis not present

## 2018-07-30 DIAGNOSIS — I10 Essential (primary) hypertension: Secondary | ICD-10-CM | POA: Diagnosis not present

## 2018-07-30 DIAGNOSIS — E118 Type 2 diabetes mellitus with unspecified complications: Secondary | ICD-10-CM | POA: Diagnosis not present

## 2018-08-20 DIAGNOSIS — E789 Disorder of lipoprotein metabolism, unspecified: Secondary | ICD-10-CM | POA: Diagnosis not present

## 2018-08-20 DIAGNOSIS — E118 Type 2 diabetes mellitus with unspecified complications: Secondary | ICD-10-CM | POA: Diagnosis not present

## 2018-08-20 DIAGNOSIS — E039 Hypothyroidism, unspecified: Secondary | ICD-10-CM | POA: Diagnosis not present

## 2018-08-20 DIAGNOSIS — I10 Essential (primary) hypertension: Secondary | ICD-10-CM | POA: Diagnosis not present

## 2018-08-20 DIAGNOSIS — Z6828 Body mass index (BMI) 28.0-28.9, adult: Secondary | ICD-10-CM | POA: Diagnosis not present

## 2018-08-20 DIAGNOSIS — E1142 Type 2 diabetes mellitus with diabetic polyneuropathy: Secondary | ICD-10-CM | POA: Diagnosis not present

## 2018-09-28 DIAGNOSIS — L0291 Cutaneous abscess, unspecified: Secondary | ICD-10-CM | POA: Diagnosis not present

## 2018-10-05 DIAGNOSIS — L0291 Cutaneous abscess, unspecified: Secondary | ICD-10-CM | POA: Diagnosis not present

## 2018-10-05 DIAGNOSIS — E118 Type 2 diabetes mellitus with unspecified complications: Secondary | ICD-10-CM | POA: Diagnosis not present

## 2018-10-05 DIAGNOSIS — M25551 Pain in right hip: Secondary | ICD-10-CM | POA: Diagnosis not present

## 2018-10-05 DIAGNOSIS — M545 Low back pain: Secondary | ICD-10-CM | POA: Diagnosis not present

## 2018-10-05 DIAGNOSIS — M25552 Pain in left hip: Secondary | ICD-10-CM | POA: Diagnosis not present

## 2018-10-06 DIAGNOSIS — L089 Local infection of the skin and subcutaneous tissue, unspecified: Secondary | ICD-10-CM | POA: Diagnosis not present

## 2018-10-06 DIAGNOSIS — L723 Sebaceous cyst: Secondary | ICD-10-CM | POA: Diagnosis not present

## 2018-10-19 DIAGNOSIS — E1142 Type 2 diabetes mellitus with diabetic polyneuropathy: Secondary | ICD-10-CM | POA: Diagnosis not present

## 2018-10-19 DIAGNOSIS — E789 Disorder of lipoprotein metabolism, unspecified: Secondary | ICD-10-CM | POA: Diagnosis not present

## 2018-10-19 DIAGNOSIS — E039 Hypothyroidism, unspecified: Secondary | ICD-10-CM | POA: Diagnosis not present

## 2018-10-19 DIAGNOSIS — E118 Type 2 diabetes mellitus with unspecified complications: Secondary | ICD-10-CM | POA: Diagnosis not present

## 2018-10-19 DIAGNOSIS — L0291 Cutaneous abscess, unspecified: Secondary | ICD-10-CM | POA: Diagnosis not present

## 2018-10-19 DIAGNOSIS — I10 Essential (primary) hypertension: Secondary | ICD-10-CM | POA: Diagnosis not present

## 2018-10-19 DIAGNOSIS — Z6828 Body mass index (BMI) 28.0-28.9, adult: Secondary | ICD-10-CM | POA: Diagnosis not present

## 2018-12-09 DIAGNOSIS — I10 Essential (primary) hypertension: Secondary | ICD-10-CM | POA: Diagnosis not present

## 2018-12-09 DIAGNOSIS — Z0000 Encounter for general adult medical examination without abnormal findings: Secondary | ICD-10-CM | POA: Diagnosis not present

## 2018-12-09 DIAGNOSIS — E039 Hypothyroidism, unspecified: Secondary | ICD-10-CM | POA: Diagnosis not present

## 2018-12-09 DIAGNOSIS — E118 Type 2 diabetes mellitus with unspecified complications: Secondary | ICD-10-CM | POA: Diagnosis not present

## 2018-12-16 DIAGNOSIS — I10 Essential (primary) hypertension: Secondary | ICD-10-CM | POA: Diagnosis not present

## 2018-12-16 DIAGNOSIS — Z0000 Encounter for general adult medical examination without abnormal findings: Secondary | ICD-10-CM | POA: Diagnosis not present

## 2018-12-16 DIAGNOSIS — E039 Hypothyroidism, unspecified: Secondary | ICD-10-CM | POA: Diagnosis not present

## 2018-12-16 DIAGNOSIS — E1142 Type 2 diabetes mellitus with diabetic polyneuropathy: Secondary | ICD-10-CM | POA: Diagnosis not present

## 2018-12-16 DIAGNOSIS — E7800 Pure hypercholesterolemia, unspecified: Secondary | ICD-10-CM | POA: Diagnosis not present

## 2019-01-12 DIAGNOSIS — E118 Type 2 diabetes mellitus with unspecified complications: Secondary | ICD-10-CM | POA: Diagnosis not present

## 2019-01-12 DIAGNOSIS — E1142 Type 2 diabetes mellitus with diabetic polyneuropathy: Secondary | ICD-10-CM | POA: Diagnosis not present

## 2019-01-12 DIAGNOSIS — E039 Hypothyroidism, unspecified: Secondary | ICD-10-CM | POA: Diagnosis not present

## 2019-01-21 DIAGNOSIS — I10 Essential (primary) hypertension: Secondary | ICD-10-CM | POA: Diagnosis not present

## 2019-01-21 DIAGNOSIS — Z6828 Body mass index (BMI) 28.0-28.9, adult: Secondary | ICD-10-CM | POA: Diagnosis not present

## 2019-01-21 DIAGNOSIS — Z7189 Other specified counseling: Secondary | ICD-10-CM | POA: Diagnosis not present

## 2019-01-21 DIAGNOSIS — E789 Disorder of lipoprotein metabolism, unspecified: Secondary | ICD-10-CM | POA: Diagnosis not present

## 2019-01-21 DIAGNOSIS — E1142 Type 2 diabetes mellitus with diabetic polyneuropathy: Secondary | ICD-10-CM | POA: Diagnosis not present

## 2019-01-21 DIAGNOSIS — E118 Type 2 diabetes mellitus with unspecified complications: Secondary | ICD-10-CM | POA: Diagnosis not present

## 2019-01-21 DIAGNOSIS — E039 Hypothyroidism, unspecified: Secondary | ICD-10-CM | POA: Diagnosis not present

## 2019-02-02 DIAGNOSIS — M5442 Lumbago with sciatica, left side: Secondary | ICD-10-CM | POA: Diagnosis not present

## 2019-02-02 DIAGNOSIS — R29898 Other symptoms and signs involving the musculoskeletal system: Secondary | ICD-10-CM | POA: Diagnosis not present

## 2019-02-02 DIAGNOSIS — M25552 Pain in left hip: Secondary | ICD-10-CM | POA: Diagnosis not present

## 2019-02-02 DIAGNOSIS — M545 Low back pain: Secondary | ICD-10-CM | POA: Diagnosis not present

## 2019-02-02 DIAGNOSIS — G8929 Other chronic pain: Secondary | ICD-10-CM | POA: Diagnosis not present

## 2019-02-09 DIAGNOSIS — M4726 Other spondylosis with radiculopathy, lumbar region: Secondary | ICD-10-CM | POA: Diagnosis not present

## 2019-02-09 DIAGNOSIS — M5136 Other intervertebral disc degeneration, lumbar region: Secondary | ICD-10-CM | POA: Diagnosis not present

## 2019-02-18 DIAGNOSIS — M48061 Spinal stenosis, lumbar region without neurogenic claudication: Secondary | ICD-10-CM | POA: Diagnosis not present

## 2019-02-18 DIAGNOSIS — M4726 Other spondylosis with radiculopathy, lumbar region: Secondary | ICD-10-CM | POA: Diagnosis not present

## 2019-02-18 DIAGNOSIS — M5126 Other intervertebral disc displacement, lumbar region: Secondary | ICD-10-CM | POA: Diagnosis not present

## 2019-02-23 ENCOUNTER — Other Ambulatory Visit: Payer: Self-pay | Admitting: Neurosurgery

## 2019-02-23 DIAGNOSIS — M4726 Other spondylosis with radiculopathy, lumbar region: Secondary | ICD-10-CM | POA: Diagnosis not present

## 2019-02-23 DIAGNOSIS — Z6827 Body mass index (BMI) 27.0-27.9, adult: Secondary | ICD-10-CM | POA: Diagnosis not present

## 2019-02-23 DIAGNOSIS — M4157 Other secondary scoliosis, lumbosacral region: Secondary | ICD-10-CM | POA: Diagnosis not present

## 2019-02-23 DIAGNOSIS — I10 Essential (primary) hypertension: Secondary | ICD-10-CM | POA: Diagnosis not present

## 2019-03-12 DIAGNOSIS — C678 Malignant neoplasm of overlapping sites of bladder: Secondary | ICD-10-CM | POA: Diagnosis not present

## 2019-03-29 ENCOUNTER — Inpatient Hospital Stay (HOSPITAL_COMMUNITY): Admission: RE | Admit: 2019-03-29 | Payer: Medicare Other | Source: Ambulatory Visit

## 2019-03-29 ENCOUNTER — Other Ambulatory Visit (HOSPITAL_COMMUNITY): Admission: RE | Admit: 2019-03-29 | Payer: Medicare HMO | Source: Ambulatory Visit

## 2019-04-16 NOTE — Pre-Procedure Instructions (Signed)
Keith [name]  04/16/2019      [id number] - [place], [place] - [address] [facility] [place] [address] Phone: [phone] Fax: [phone]    Your procedure is scheduled on 04/21/19.  Report to [facility] Admitting at 7 A.M.  Call this number if you have problems the morning of surgery:  [phone]   Remember:  Do not eat or drink after midnight.  Y    Take these medicines the morning of surgery with A SIP OF WATER ----EYE DROPS,NEURONTIN,SYNTHROID,OXYCODONE,ULTRAM,FLOMAX    Do not wear jewelry, make-up or nail polish.  Do not wear lotions, powders, or perfumes, or deodorant.  Do not shave 48 hours prior to surgery.  Men may shave face and neck.  Do not bring valuables to the hospital.  [facility] is not responsible for any belongings or valuables.  Contacts, dentures or bridgework may not be worn into surgery.  Leave your suitcase in the car.  After surgery it may be brought to your room.  For patients admitted to the hospital, discharge time will be determined by your treatment team.  Patients discharged the day of surgery will not be allowed to drive home.   Do not take any aspirin,anti-inflammatories,vitamins,or herbal supplements 5-7 days prior to surgery.[facility] - Preparing for Surgery  Before surgery, you can play an important role.  Because skin is not sterile, your skin needs to be as free of germs as possible.  You can reduce the number of germs on you skin by washing with CHG (chlorahexidine gluconate) soap before surgery.  CHG is an antiseptic cleaner which kills germs and bonds with the skin to continue killing germs even after washing.  Oral Hygiene is also important in reducing the risk of infection.  Remember to brush your teeth with your regular toothpaste the morning of surgery.  Please DO NOT use if you have an allergy to CHG or antibacterial soaps.  If your skin becomes reddened/irritated stop using the CHG and  inform your nurse when you arrive at Short Stay.  Do not shave (including legs and underarms) for at least 48 hours prior to the first CHG shower.  You may shave your face.  Please follow these instructions carefully:   1.  Shower with CHG Soap the night before surgery and the morning of Surgery.  2.  If you choose to wash your hair, wash your hair first as usual with your normal shampoo.  3.  After you shampoo, rinse your hair and body thoroughly to remove the shampoo. 4.  Use CHG as you would any other liquid soap.  You can apply chg directly to the skin and wash gently with a      scrungie or washcloth.           5.  Apply the CHG Soap to your body ONLY FROM THE NECK DOWN.   Do not use on open wounds or open sores. Avoid contact with your eyes, ears, mouth and genitals (private parts).  Wash genitals (private parts) with your normal soap.  6.  Wash thoroughly, paying special attention to the area where your surgery will be performed.  7.  Thoroughly rinse your body with warm water from the neck down.  8.  DO NOT shower/wash with your normal soap after using and rinsing off the CHG Soap.  9.  Pat yourself dry with a clean towel.  10.  Wear clean pajamas.            11.  Place clean sheets on your bed the night of your first shower and do not sleep with pets.  Day of Surgery  Do not apply any lotions/deoderants the morning of surgery.   Please wear clean clothes to the hospital/surgery center. Remember to brush your teeth with toothpaste.   Special instructions:    Please read over the following fact sheets that you were given. MRSA Information

## 2019-04-17 ENCOUNTER — Other Ambulatory Visit (HOSPITAL_COMMUNITY): Payer: Medicare HMO

## 2019-04-17 ENCOUNTER — Other Ambulatory Visit (HOSPITAL_COMMUNITY)
Admission: RE | Admit: 2019-04-17 | Discharge: 2019-04-17 | Disposition: A | Discharge status: Home / Self Care | Payer: Medicare HMO | Source: Ambulatory Visit | Attending: Neurosurgery | Admitting: Neurosurgery

## 2019-04-17 DIAGNOSIS — Z20828 Contact with and (suspected) exposure to other viral communicable diseases: Secondary | ICD-10-CM | POA: Insufficient documentation

## 2019-04-17 DIAGNOSIS — Z01812 Encounter for preprocedural laboratory examination: Secondary | ICD-10-CM | POA: Diagnosis not present

## 2019-04-18 LAB — NOVEL CORONAVIRUS, NAA (HOSP ORDER, SEND-OUT TO REF LAB; TAT 18-24 HRS): SARS-CoV-2, NAA: NOT DETECTED

## 2019-04-20 ENCOUNTER — Encounter (HOSPITAL_COMMUNITY): Payer: Self-pay

## 2019-04-20 ENCOUNTER — Encounter (HOSPITAL_COMMUNITY)
Admission: RE | Admit: 2019-04-20 | Discharge: 2019-04-20 | Disposition: A | Discharge status: Home / Self Care | Payer: Medicare HMO | Source: Ambulatory Visit | Attending: Neurosurgery | Admitting: Neurosurgery

## 2019-04-20 ENCOUNTER — Other Ambulatory Visit: Payer: Self-pay

## 2019-04-20 DIAGNOSIS — I4510 Unspecified right bundle-branch block: Secondary | ICD-10-CM | POA: Diagnosis not present

## 2019-04-20 DIAGNOSIS — I10 Essential (primary) hypertension: Secondary | ICD-10-CM | POA: Insufficient documentation

## 2019-04-20 DIAGNOSIS — Z01818 Encounter for other preprocedural examination: Secondary | ICD-10-CM | POA: Diagnosis not present

## 2019-04-20 DIAGNOSIS — R9431 Abnormal electrocardiogram [ECG] [EKG]: Secondary | ICD-10-CM | POA: Diagnosis not present

## 2019-04-20 HISTORY — DX: Essential (primary) hypertension: I10

## 2019-04-20 LAB — CBC
HCT: 40.3 % (ref 39.0–52.0)
Hemoglobin: 13.5 g/dL (ref 13.0–17.0)
MCH: 30.9 pg (ref 26.0–34.0)
MCHC: 33.5 g/dL (ref 30.0–36.0)
MCV: 92.2 fL (ref 80.0–100.0)
Platelets: 174 10*3/uL (ref 150–400)
RBC: 4.37 MIL/uL (ref 4.22–5.81)
RDW: 12.4 % (ref 11.5–15.5)
WBC: 5.2 10*3/uL (ref 4.0–10.5)
nRBC: 0 % (ref 0.0–0.2)

## 2019-04-20 LAB — ABO/RH: ABO/RH(D): A NEG

## 2019-04-20 LAB — BASIC METABOLIC PANEL
Anion gap: 7 (ref 5–15)
BUN: 13 mg/dL (ref 8–23)
CO2: 25 mmol/L (ref 22–32)
Calcium: 8.9 mg/dL (ref 8.9–10.3)
Chloride: 106 mmol/L (ref 98–111)
Creatinine, Ser: 0.87 mg/dL (ref 0.61–1.24)
GFR calc Af Amer: >60 mL/min (ref >60)
GFR calc non Af Amer: >60 mL/min (ref >60)
Glucose, Bld: 202 mg/dL — ABNORMAL HIGH (ref 70–99)
Potassium: 4 mmol/L (ref 3.5–5.1)
Sodium: 138 mmol/L (ref 135–145)

## 2019-04-20 LAB — TYPE AND SCREEN
ABO/RH(D): A NEG
Antibody Screen: NEGATIVE

## 2019-04-20 LAB — SURGICAL PCR SCREEN
MRSA, PCR: NEGATIVE
Staphylococcus aureus: NEGATIVE

## 2019-04-20 LAB — GLUCOSE, CAPILLARY: Glucose-Capillary: 245 mg/dL — ABNORMAL HIGH (ref 70–99)

## 2019-04-20 NOTE — Progress Notes (Addendum)
PCP - DR Maudie Mercury   AT [place] Cardiologist - NA  Chest x-ray - NA EKG - 04/20/19 Stress Test - NA ECHO - NA Cardiac Cath - NA  Sleep Study - NA   Fasting Blood Sugar - 268 Checks Blood Sugar __1___ times a day   Aspirin Instructions: STOP Anesthesia review:REVIEW EKG   Patient denies shortness of breath, fever, cough and chest pain at PAT appointment   Patient verbalized understanding of instructions that were given to them at the PAT appointment. Patient was also instructed that they will need to review over the PAT instructions again at home before surgery.

## 2019-04-21 ENCOUNTER — Inpatient Hospital Stay (HOSPITAL_COMMUNITY): Admission: RE | Admit: 2019-04-21 | Payer: Medicare HMO | Source: Home / Self Care | Admitting: Pediatrics

## 2019-04-21 ENCOUNTER — Encounter (HOSPITAL_COMMUNITY): Admission: RE | Payer: Self-pay | Source: Home / Self Care

## 2019-04-21 LAB — HEMOGLOBIN A1C
Hgb A1c MFr Bld: 7.3 % — ABNORMAL HIGH (ref 4.8–5.6)
Mean Plasma Glucose: 163 mg/dL

## 2019-04-21 SURGERY — POSTERIOR LUMBAR FUSION 1 LEVEL
Anesthesia: General

## 2019-05-04 ENCOUNTER — Other Ambulatory Visit: Payer: Self-pay | Admitting: Neurosurgery

## 2019-05-17 ENCOUNTER — Encounter (HOSPITAL_COMMUNITY): Payer: Self-pay | Admitting: *Deleted

## 2019-05-17 ENCOUNTER — Other Ambulatory Visit (HOSPITAL_COMMUNITY)
Admission: RE | Admit: 2019-05-17 | Discharge: 2019-05-17 | Disposition: A | Discharge status: Home / Self Care | Payer: Medicare HMO | Source: Ambulatory Visit | Attending: Neurosurgery | Admitting: Neurosurgery

## 2019-05-17 DIAGNOSIS — Z87891 Personal history of nicotine dependence: Secondary | ICD-10-CM | POA: Diagnosis not present

## 2019-05-17 DIAGNOSIS — I10 Essential (primary) hypertension: Secondary | ICD-10-CM | POA: Diagnosis present

## 2019-05-17 DIAGNOSIS — M4316 Spondylolisthesis, lumbar region: Secondary | ICD-10-CM | POA: Diagnosis present

## 2019-05-17 DIAGNOSIS — M5136 Other intervertebral disc degeneration, lumbar region: Secondary | ICD-10-CM | POA: Diagnosis present

## 2019-05-17 DIAGNOSIS — M4186 Other forms of scoliosis, lumbar region: Secondary | ICD-10-CM | POA: Diagnosis not present

## 2019-05-17 DIAGNOSIS — M4156 Other secondary scoliosis, lumbar region: Secondary | ICD-10-CM | POA: Diagnosis present

## 2019-05-17 DIAGNOSIS — M48062 Spinal stenosis, lumbar region with neurogenic claudication: Secondary | ICD-10-CM | POA: Diagnosis present

## 2019-05-17 DIAGNOSIS — Z20828 Contact with and (suspected) exposure to other viral communicable diseases: Secondary | ICD-10-CM | POA: Diagnosis present

## 2019-05-17 DIAGNOSIS — M5137 Other intervertebral disc degeneration, lumbosacral region: Secondary | ICD-10-CM | POA: Diagnosis not present

## 2019-05-17 DIAGNOSIS — M4326 Fusion of spine, lumbar region: Secondary | ICD-10-CM | POA: Diagnosis not present

## 2019-05-17 DIAGNOSIS — Z7984 Long term (current) use of oral hypoglycemic drugs: Secondary | ICD-10-CM | POA: Diagnosis not present

## 2019-05-17 DIAGNOSIS — E119 Type 2 diabetes mellitus without complications: Secondary | ICD-10-CM | POA: Diagnosis present

## 2019-05-17 DIAGNOSIS — M4187 Other forms of scoliosis, lumbosacral region: Secondary | ICD-10-CM | POA: Diagnosis not present

## 2019-05-17 DIAGNOSIS — E039 Hypothyroidism, unspecified: Secondary | ICD-10-CM | POA: Diagnosis not present

## 2019-05-17 LAB — SARS CORONAVIRUS 2 (TAT 6-24 HRS): SARS Coronavirus 2: NEGATIVE

## 2019-05-17 NOTE — Progress Notes (Signed)
Denies CP, Shob, or cardiologist. Has CHG from previously cancelled surgery. Will use the night before and morning of surgery.      o Check your blood sugar the morning of your surgery when you wake up and every 2 hours until you get to the Short Stay unit. . If your blood sugar is less than 70 mg/dL, you will need to treat for low blood sugar: o Do not take insulin. o Treat a low blood sugar (less than 70 mg/dL) with  cup of clear juice (cranberry or apple), 4 glucose tablets, OR glucose gel. Recheck blood sugar in 15 minutes after treatment (to make sure it is greater than 70 mg/dL). If your blood sugar is not greater than 70 mg/dL on recheck, call [phone] o  for further instructions. . Report your blood sugar to the short stay nurse when you get to Short Stay.  . If you are admitted to the hospital after surgery: o Your blood sugar will be checked by the staff and you will probably be given insulin after surgery (instead of oral diabetes medicines) to make sure you have good blood sugar levels. o The goal for blood sugar control after surgery is 80-180 mg/dL.      WHAT DO I DO ABOUT MY DIABETES MEDICATION?   Marland Kitchen Do not take oral diabetes medicines (pills) the morning of surgery.

## 2019-05-19 ENCOUNTER — Encounter (HOSPITAL_COMMUNITY)
Admission: RE | Disposition: A | Discharge status: Home / Self Care | Payer: Self-pay | Source: Home / Self Care | Attending: Neurosurgery

## 2019-05-19 ENCOUNTER — Inpatient Hospital Stay (HOSPITAL_COMMUNITY): Payer: Medicare HMO | Admitting: Physician Assistant

## 2019-05-19 ENCOUNTER — Encounter (HOSPITAL_COMMUNITY): Payer: Self-pay

## 2019-05-19 ENCOUNTER — Inpatient Hospital Stay (HOSPITAL_COMMUNITY): Payer: Medicare HMO

## 2019-05-19 ENCOUNTER — Other Ambulatory Visit: Payer: Self-pay

## 2019-05-19 ENCOUNTER — Inpatient Hospital Stay (HOSPITAL_COMMUNITY)
Admission: RE | Admit: 2019-05-19 | Discharge: 2019-05-20 | DRG: 458 | Disposition: A | Discharge status: Home / Self Care | Payer: Medicare HMO | Attending: Neurosurgery | Admitting: Neurosurgery

## 2019-05-19 DIAGNOSIS — Z20828 Contact with and (suspected) exposure to other viral communicable diseases: Secondary | ICD-10-CM | POA: Diagnosis present

## 2019-05-19 DIAGNOSIS — M4187 Other forms of scoliosis, lumbosacral region: Secondary | ICD-10-CM | POA: Diagnosis not present

## 2019-05-19 DIAGNOSIS — M4326 Fusion of spine, lumbar region: Secondary | ICD-10-CM | POA: Diagnosis not present

## 2019-05-19 DIAGNOSIS — Z419 Encounter for procedure for purposes other than remedying health state, unspecified: Secondary | ICD-10-CM

## 2019-05-19 DIAGNOSIS — E119 Type 2 diabetes mellitus without complications: Secondary | ICD-10-CM | POA: Diagnosis present

## 2019-05-19 DIAGNOSIS — I10 Essential (primary) hypertension: Secondary | ICD-10-CM | POA: Diagnosis present

## 2019-05-19 DIAGNOSIS — M48062 Spinal stenosis, lumbar region with neurogenic claudication: Principal | ICD-10-CM | POA: Diagnosis present

## 2019-05-19 DIAGNOSIS — M5136 Other intervertebral disc degeneration, lumbar region: Secondary | ICD-10-CM | POA: Diagnosis present

## 2019-05-19 DIAGNOSIS — Z87891 Personal history of nicotine dependence: Secondary | ICD-10-CM

## 2019-05-19 DIAGNOSIS — Z7984 Long term (current) use of oral hypoglycemic drugs: Secondary | ICD-10-CM

## 2019-05-19 DIAGNOSIS — M4156 Other secondary scoliosis, lumbar region: Secondary | ICD-10-CM | POA: Diagnosis present

## 2019-05-19 DIAGNOSIS — M5137 Other intervertebral disc degeneration, lumbosacral region: Secondary | ICD-10-CM | POA: Diagnosis not present

## 2019-05-19 DIAGNOSIS — E039 Hypothyroidism, unspecified: Secondary | ICD-10-CM | POA: Diagnosis not present

## 2019-05-19 DIAGNOSIS — M4316 Spondylolisthesis, lumbar region: Secondary | ICD-10-CM | POA: Diagnosis present

## 2019-05-19 LAB — BASIC METABOLIC PANEL
Anion gap: 14 (ref 5–15)
BUN: 11 mg/dL (ref 8–23)
CO2: 18 mmol/L — ABNORMAL LOW (ref 22–32)
Calcium: 9.2 mg/dL (ref 8.9–10.3)
Chloride: 109 mmol/L (ref 98–111)
Creatinine, Ser: 0.7 mg/dL (ref 0.61–1.24)
GFR calc Af Amer: >60 mL/min (ref >60)
GFR calc non Af Amer: >60 mL/min (ref >60)
Glucose, Bld: 115 mg/dL — ABNORMAL HIGH (ref 70–99)
Potassium: 4.2 mmol/L (ref 3.5–5.1)
Sodium: 141 mmol/L (ref 135–145)

## 2019-05-19 LAB — GLUCOSE, CAPILLARY
Glucose-Capillary: 141 mg/dL — ABNORMAL HIGH (ref 70–99)
Glucose-Capillary: 109 mg/dL — ABNORMAL HIGH (ref 70–99)
Glucose-Capillary: 87 mg/dL (ref 70–99)
Glucose-Capillary: 159 mg/dL — ABNORMAL HIGH (ref 70–99)

## 2019-05-19 LAB — TYPE AND SCREEN
ABO/RH(D): A NEG
Antibody Screen: NEGATIVE

## 2019-05-19 LAB — CBC
HCT: 41.8 % (ref 39.0–52.0)
Hemoglobin: 14.1 g/dL (ref 13.0–17.0)
MCH: 31.7 pg (ref 26.0–34.0)
MCHC: 33.7 g/dL (ref 30.0–36.0)
MCV: 93.9 fL (ref 80.0–100.0)
Platelets: 151 10*3/uL (ref 150–400)
RBC: 4.45 MIL/uL (ref 4.22–5.81)
RDW: 12.9 % (ref 11.5–15.5)
WBC: 5.5 10*3/uL (ref 4.0–10.5)
nRBC: 0 % (ref 0.0–0.2)

## 2019-05-19 SURGERY — POSTERIOR LUMBAR FUSION 1 LEVEL
Anesthesia: General | Site: Back

## 2019-05-19 MED ORDER — SODIUM CHLORIDE 0.9 % IV SOLN: 250.0000 mL | INTRAVENOUS | Status: DC

## 2019-05-19 MED ORDER — ONDANSETRON HCL 4 MG/2ML IJ SOLN: 4.0000 mg | Freq: Once | INTRAMUSCULAR | Status: DC | PRN

## 2019-05-19 MED ORDER — 0.9 % SODIUM CHLORIDE (POUR BTL) OPTIME
TOPICAL | Status: DC | PRN
  Administered 2019-05-19: 1000 mL

## 2019-05-19 MED ORDER — ONDANSETRON HCL 4 MG/2ML IJ SOLN: 4.0000 mg | Freq: Four times a day (QID) | INTRAMUSCULAR | Status: DC | PRN

## 2019-05-19 MED ORDER — CEFAZOLIN SODIUM-DEXTROSE 2-4 GM/100ML-% IV SOLN
INTRAVENOUS | Status: AC
  Filled 2019-05-19: qty 100

## 2019-05-19 MED ORDER — THROMBIN 20000 UNITS EX SOLR
CUTANEOUS | Status: DC | PRN
  Administered 2019-05-19: 20 mL via TOPICAL

## 2019-05-19 MED ORDER — AMLODIPINE BESYLATE 5 MG PO TABS
5.0000 mg | ORAL_TABLET | Freq: Every day | ORAL | Status: DC
  Administered 2019-05-20: 5 mg via ORAL
  Filled 2019-05-19: qty 1

## 2019-05-19 MED ORDER — GLIMEPIRIDE 2 MG PO TABS
1.0000 mg | ORAL_TABLET | Freq: Every day | ORAL | Status: DC
  Administered 2019-05-19: 1 mg via ORAL
  Filled 2019-05-19 (×2): qty 1

## 2019-05-19 MED ORDER — ROCURONIUM BROMIDE 10 MG/ML (PF) SYRINGE
PREFILLED_SYRINGE | INTRAVENOUS | Status: AC
  Filled 2019-05-19: qty 10

## 2019-05-19 MED ORDER — OXYCODONE HCL ER 10 MG PO T12A
10.0000 mg | EXTENDED_RELEASE_TABLET | Freq: Two times a day (BID) | ORAL | Status: DC
  Administered 2019-05-20: 10 mg via ORAL
  Filled 2019-05-19 (×2): qty 1

## 2019-05-19 MED ORDER — ROCURONIUM BROMIDE 50 MG/5ML IV SOSY
PREFILLED_SYRINGE | INTRAVENOUS | Status: DC | PRN
  Administered 2019-05-19: 20 mg via INTRAVENOUS
  Administered 2019-05-19: 10 mg via INTRAVENOUS
  Administered 2019-05-19: 50 mg via INTRAVENOUS

## 2019-05-19 MED ORDER — THROMBIN 20000 UNITS EX SOLR
CUTANEOUS | Status: AC
  Filled 2019-05-19: qty 20000

## 2019-05-19 MED ORDER — ONDANSETRON HCL 4 MG PO TABS: 4.0000 mg | ORAL_TABLET | Freq: Four times a day (QID) | ORAL | Status: DC | PRN

## 2019-05-19 MED ORDER — GABAPENTIN 300 MG PO CAPS: 300.0000 mg | ORAL_CAPSULE | Freq: Three times a day (TID) | ORAL | Status: DC

## 2019-05-19 MED ORDER — ONDANSETRON HCL 4 MG/2ML IJ SOLN
INTRAMUSCULAR | Status: AC
  Filled 2019-05-19: qty 2

## 2019-05-19 MED ORDER — LOSARTAN POTASSIUM 50 MG PO TABS
100.0000 mg | ORAL_TABLET | Freq: Every day | ORAL | Status: DC
  Administered 2019-05-20: 100 mg via ORAL
  Filled 2019-05-19: qty 2

## 2019-05-19 MED ORDER — PHENOL 1.4 % MT LIQD: 1.0000 | OROMUCOSAL | Status: DC | PRN

## 2019-05-19 MED ORDER — DEXAMETHASONE SODIUM PHOSPHATE 10 MG/ML IJ SOLN
INTRAMUSCULAR | Status: AC
  Filled 2019-05-19: qty 1

## 2019-05-19 MED ORDER — SENNOSIDES-DOCUSATE SODIUM 8.6-50 MG PO TABS: 1.0000 | ORAL_TABLET | Freq: Every evening | ORAL | Status: DC | PRN

## 2019-05-19 MED ORDER — VITAMIN B-12 1000 MCG PO TABS
1000.0000 ug | ORAL_TABLET | Freq: Every day | ORAL | Status: DC
  Administered 2019-05-20: 1000 ug via ORAL
  Filled 2019-05-19: qty 1

## 2019-05-19 MED ORDER — PROPOFOL 10 MG/ML IV BOLUS
INTRAVENOUS | Status: AC
  Filled 2019-05-19: qty 20

## 2019-05-19 MED ORDER — SODIUM CHLORIDE 0.9% FLUSH
3.0000 mL | Freq: Two times a day (BID) | INTRAVENOUS | Status: DC
  Administered 2019-05-20: 3 mL via INTRAVENOUS

## 2019-05-19 MED ORDER — ACETAMINOPHEN 325 MG PO TABS
650.0000 mg | ORAL_TABLET | ORAL | Status: DC | PRN
  Administered 2019-05-20 (×3): 650 mg via ORAL
  Filled 2019-05-19 (×3): qty 2

## 2019-05-19 MED ORDER — BUPIVACAINE HCL (PF) 0.5 % IJ SOLN
INTRAMUSCULAR | Status: DC | PRN
  Administered 2019-05-19: 20 mL

## 2019-05-19 MED ORDER — LIDOCAINE-EPINEPHRINE 0.5 %-1:200000 IJ SOLN
INTRAMUSCULAR | Status: DC | PRN
  Administered 2019-05-19: 10 mL

## 2019-05-19 MED ORDER — GABAPENTIN 600 MG PO TABS
600.0000 mg | ORAL_TABLET | Freq: Three times a day (TID) | ORAL | Status: DC
  Administered 2019-05-19 – 2019-05-20 (×3): 600 mg via ORAL
  Filled 2019-05-19 (×3): qty 1

## 2019-05-19 MED ORDER — DEXAMETHASONE SODIUM PHOSPHATE 10 MG/ML IJ SOLN
INTRAMUSCULAR | Status: DC | PRN
  Administered 2019-05-19: 10 mg via INTRAVENOUS

## 2019-05-19 MED ORDER — TAMSULOSIN HCL 0.4 MG PO CAPS
0.4000 mg | ORAL_CAPSULE | Freq: Every morning | ORAL | Status: DC
  Administered 2019-05-20: 0.4 mg via ORAL
  Filled 2019-05-19 (×2): qty 1

## 2019-05-19 MED ORDER — EPHEDRINE SULFATE 50 MG/ML IJ SOLN
INTRAMUSCULAR | Status: DC | PRN
  Administered 2019-05-19: 10 mg via INTRAVENOUS

## 2019-05-19 MED ORDER — SUFENTANIL CITRATE 50 MCG/ML IV SOLN
INTRAVENOUS | Status: DC | PRN
  Administered 2019-05-19: 5 ug via INTRAVENOUS
  Administered 2019-05-19 (×2): 10 ug via INTRAVENOUS
  Administered 2019-05-19: 5 ug via INTRAVENOUS

## 2019-05-19 MED ORDER — FLUTICASONE PROPIONATE 50 MCG/ACT NA SUSP
2.0000 | Freq: Every day | NASAL | Status: DC
  Filled 2019-05-19: qty 16

## 2019-05-19 MED ORDER — FENTANYL CITRATE (PF) 100 MCG/2ML IJ SOLN
INTRAMUSCULAR | Status: AC
  Filled 2019-05-19: qty 2

## 2019-05-19 MED ORDER — LIDOCAINE 2% (20 MG/ML) 5 ML SYRINGE
INTRAMUSCULAR | Status: AC
  Filled 2019-05-19: qty 5

## 2019-05-19 MED ORDER — PHENYLEPHRINE 40 MCG/ML (10ML) SYRINGE FOR IV PUSH (FOR BLOOD PRESSURE SUPPORT)
PREFILLED_SYRINGE | INTRAVENOUS | Status: AC
  Filled 2019-05-19: qty 10

## 2019-05-19 MED ORDER — ZOLPIDEM TARTRATE 5 MG PO TABS: 5.0000 mg | ORAL_TABLET | Freq: Every evening | ORAL | Status: DC | PRN

## 2019-05-19 MED ORDER — SUGAMMADEX SODIUM 200 MG/2ML IV SOLN
INTRAVENOUS | Status: DC | PRN
  Administered 2019-05-19: 200 mg via INTRAVENOUS

## 2019-05-19 MED ORDER — MENTHOL 3 MG MT LOZG: 1.0000 | LOZENGE | OROMUCOSAL | Status: DC | PRN

## 2019-05-19 MED ORDER — MAGNESIUM CITRATE PO SOLN: 1.0000 | Freq: Once | ORAL | Status: DC | PRN

## 2019-05-19 MED ORDER — LIDOCAINE HCL (CARDIAC) PF 100 MG/5ML IV SOSY
PREFILLED_SYRINGE | INTRAVENOUS | Status: DC | PRN
  Administered 2019-05-19: 30 mg via INTRAVENOUS

## 2019-05-19 MED ORDER — DOCUSATE SODIUM 100 MG PO CAPS
100.0000 mg | ORAL_CAPSULE | Freq: Two times a day (BID) | ORAL | Status: DC
  Administered 2019-05-19 – 2019-05-20 (×2): 100 mg via ORAL
  Filled 2019-05-19: qty 1

## 2019-05-19 MED ORDER — CEFAZOLIN SODIUM-DEXTROSE 2-4 GM/100ML-% IV SOLN
2.0000 g | Freq: Three times a day (TID) | INTRAVENOUS | Status: DC
  Administered 2019-05-19 – 2019-05-20 (×3): 2 g via INTRAVENOUS
  Filled 2019-05-19 (×3): qty 100

## 2019-05-19 MED ORDER — ONDANSETRON HCL 4 MG/2ML IJ SOLN
INTRAMUSCULAR | Status: DC | PRN
  Administered 2019-05-19: 4 mg via INTRAVENOUS

## 2019-05-19 MED ORDER — LACTATED RINGERS IV SOLN
INTRAVENOUS | Status: DC
  Administered 2019-05-19 (×2): via INTRAVENOUS

## 2019-05-19 MED ORDER — LINAGLIPTIN 5 MG PO TABS
5.0000 mg | ORAL_TABLET | Freq: Every day | ORAL | Status: DC
  Administered 2019-05-20: 5 mg via ORAL
  Filled 2019-05-19: qty 1

## 2019-05-19 MED ORDER — BISACODYL 5 MG PO TBEC: 5.0000 mg | DELAYED_RELEASE_TABLET | Freq: Every day | ORAL | Status: DC | PRN

## 2019-05-19 MED ORDER — SODIUM CHLORIDE 0.9 % IV SOLN
INTRAVENOUS | Status: DC | PRN
  Administered 2019-05-19: 50 ug/min via INTRAVENOUS

## 2019-05-19 MED ORDER — LIDOCAINE-EPINEPHRINE 0.5 %-1:200000 IJ SOLN
INTRAMUSCULAR | Status: AC
  Filled 2019-05-19: qty 1

## 2019-05-19 MED ORDER — HYDROMORPHONE HCL 1 MG/ML IJ SOLN: 1.0000 mg | INTRAMUSCULAR | Status: DC | PRN

## 2019-05-19 MED ORDER — DIAZEPAM 5 MG PO TABS
5.0000 mg | ORAL_TABLET | Freq: Four times a day (QID) | ORAL | Status: DC | PRN
  Administered 2019-05-20: 5 mg via ORAL
  Filled 2019-05-19: qty 1

## 2019-05-19 MED ORDER — LORATADINE 10 MG PO TABS
10.0000 mg | ORAL_TABLET | Freq: Every day | ORAL | Status: DC
  Administered 2019-05-20: 10 mg via ORAL
  Filled 2019-05-19: qty 1

## 2019-05-19 MED ORDER — BUPIVACAINE HCL (PF) 0.5 % IJ SOLN
INTRAMUSCULAR | Status: AC
  Filled 2019-05-19: qty 30

## 2019-05-19 MED ORDER — TIMOLOL MALEATE 0.5 % OP SOLN
1.0000 [drp] | Freq: Every day | OPHTHALMIC | Status: DC
  Filled 2019-05-19: qty 5

## 2019-05-19 MED ORDER — THROMBIN 5000 UNITS EX SOLR
CUTANEOUS | Status: AC
  Filled 2019-05-19: qty 5000

## 2019-05-19 MED ORDER — ACETAMINOPHEN 650 MG RE SUPP: 650.0000 mg | RECTAL | Status: DC | PRN

## 2019-05-19 MED ORDER — POTASSIUM CHLORIDE IN NACL 20-0.9 MEQ/L-% IV SOLN: INTRAVENOUS | Status: DC

## 2019-05-19 MED ORDER — ATORVASTATIN CALCIUM 10 MG PO TABS
20.0000 mg | ORAL_TABLET | Freq: Every morning | ORAL | Status: DC
  Administered 2019-05-20: 20 mg via ORAL
  Filled 2019-05-19: qty 2

## 2019-05-19 MED ORDER — SUFENTANIL CITRATE 50 MCG/ML IV SOLN
INTRAVENOUS | Status: AC
  Filled 2019-05-19: qty 1

## 2019-05-19 MED ORDER — PHENYLEPHRINE HCL (PRESSORS) 10 MG/ML IV SOLN
INTRAVENOUS | Status: DC | PRN
  Administered 2019-05-19: 100 ug via INTRAVENOUS

## 2019-05-19 MED ORDER — LEVOTHYROXINE SODIUM 100 MCG PO TABS
100.0000 ug | ORAL_TABLET | Freq: Every day | ORAL | Status: DC
  Administered 2019-05-20: 100 ug via ORAL
  Filled 2019-05-19: qty 1

## 2019-05-19 MED ORDER — FENTANYL CITRATE (PF) 100 MCG/2ML IJ SOLN
25.0000 ug | INTRAMUSCULAR | Status: DC | PRN
  Administered 2019-05-19: 50 ug via INTRAVENOUS

## 2019-05-19 MED ORDER — SODIUM CHLORIDE 0.9% FLUSH: 3.0000 mL | INTRAVENOUS | Status: DC | PRN

## 2019-05-19 MED ORDER — OXYCODONE HCL 5 MG PO TABS: 5.0000 mg | ORAL_TABLET | ORAL | Status: DC | PRN

## 2019-05-19 MED ORDER — PROPOFOL 10 MG/ML IV BOLUS
INTRAVENOUS | Status: DC | PRN
  Administered 2019-05-19: 150 mg via INTRAVENOUS

## 2019-05-19 MED ORDER — OXYCODONE HCL 5 MG PO TABS
10.0000 mg | ORAL_TABLET | ORAL | Status: DC | PRN
  Administered 2019-05-19: 10 mg via ORAL
  Filled 2019-05-19: qty 2

## 2019-05-19 SURGICAL SUPPLY — 74 items
BASKET BONE COLLECTION (BASKET) ×3 IMPLANT
BENZOIN TINCTURE PRP APPL 2/3 (GAUZE/BANDAGES/DRESSINGS) IMPLANT
BLADE CLIPPER SURG (BLADE) IMPLANT
BUR MATCHSTICK NEURO 3.0 LAGG (BURR) ×3 IMPLANT
BUR PRECISION FLUTE 5.0 (BURR) ×3 IMPLANT
CAGE POST IBF 10X8D 26/9 (Cage) ×6 IMPLANT
CANISTER SUCT 3000ML PPV (MISCELLANEOUS) ×3 IMPLANT
CAP RELINE MOD TULIP RMM (Cap) ×12 IMPLANT
CARTRIDGE OIL MAESTRO DRILL (MISCELLANEOUS) ×1 IMPLANT
CLOSURE WOUND 1/2 X4 (GAUZE/BANDAGES/DRESSINGS)
CONT SPEC 4OZ CLIKSEAL STRL BL (MISCELLANEOUS) ×3 IMPLANT
COVER BACK TABLE 60X90IN (DRAPES) ×3 IMPLANT
COVER WAND RF STERILE (DRAPES) IMPLANT
DECANTER SPIKE VIAL GLASS SM (MISCELLANEOUS) ×3 IMPLANT
DERMABOND ADVANCED (GAUZE/BANDAGES/DRESSINGS) ×4
DERMABOND ADVANCED .7 DNX12 (GAUZE/BANDAGES/DRESSINGS) ×2 IMPLANT
DIFFUSER DRILL AIR PNEUMATIC (MISCELLANEOUS) ×3 IMPLANT
DRAPE C-ARM 42X72 X-RAY (DRAPES) ×3 IMPLANT
DRAPE C-ARMOR (DRAPES) ×3 IMPLANT
DRAPE LAPAROTOMY 100X72X124 (DRAPES) ×3 IMPLANT
DRAPE POUCH INSTRU U-SHP 10X18 (DRAPES) IMPLANT
DRAPE SURG 17X23 STRL (DRAPES) ×3 IMPLANT
DRSG OPSITE POSTOP 4X6 (GAUZE/BANDAGES/DRESSINGS) ×3 IMPLANT
DURAPREP 26ML APPLICATOR (WOUND CARE) ×3 IMPLANT
ELECT REM PT RETURN 9FT ADLT (ELECTROSURGICAL) ×3
ELECTRODE REM PT RTRN 9FT ADLT (ELECTROSURGICAL) ×1 IMPLANT
GAUZE 4X4 16PLY RFD (DISPOSABLE) ×3 IMPLANT
GAUZE SPONGE 4X4 12PLY STRL (GAUZE/BANDAGES/DRESSINGS) IMPLANT
GLOVE BIO SURGEON STRL SZ 6.5 (GLOVE) ×2 IMPLANT
GLOVE BIO SURGEON STRL SZ7 (GLOVE) ×15 IMPLANT
GLOVE BIO SURGEON STRL SZ8 (GLOVE) ×3 IMPLANT
GLOVE BIO SURGEONS STRL SZ 6.5 (GLOVE) ×1
GLOVE BIOGEL PI IND STRL 6.5 (GLOVE) ×1 IMPLANT
GLOVE BIOGEL PI IND STRL 7.5 (GLOVE) ×2 IMPLANT
GLOVE BIOGEL PI IND STRL 8 (GLOVE) ×2 IMPLANT
GLOVE BIOGEL PI INDICATOR 6.5 (GLOVE) ×2
GLOVE BIOGEL PI INDICATOR 7.5 (GLOVE) ×4
GLOVE BIOGEL PI INDICATOR 8 (GLOVE) ×4
GLOVE ECLIPSE 6.5 STRL STRAW (GLOVE) ×6 IMPLANT
GLOVE EXAM NITRILE XL STR (GLOVE) IMPLANT
GLOVE SURG SS PI 6.0 STRL IVOR (GLOVE) ×6 IMPLANT
GOWN STRL REUS W/ TWL LRG LVL3 (GOWN DISPOSABLE) ×5 IMPLANT
GOWN STRL REUS W/ TWL XL LVL3 (GOWN DISPOSABLE) IMPLANT
GOWN STRL REUS W/TWL 2XL LVL3 (GOWN DISPOSABLE) ×3 IMPLANT
GOWN STRL REUS W/TWL LRG LVL3 (GOWN DISPOSABLE) ×10
GOWN STRL REUS W/TWL XL LVL3 (GOWN DISPOSABLE)
KIT BASIN OR (CUSTOM PROCEDURE TRAY) ×3 IMPLANT
KIT POSITION SURG JACKSON T1 (MISCELLANEOUS) ×3 IMPLANT
KIT TURNOVER KIT B (KITS) ×3 IMPLANT
MILL MEDIUM DISP (BLADE) ×3 IMPLANT
NEEDLE HYPO 25X1 1.5 SAFETY (NEEDLE) ×3 IMPLANT
NEEDLE SPNL 18GX3.5 QUINCKE PK (NEEDLE) ×3 IMPLANT
NS IRRIG 1000ML POUR BTL (IV SOLUTION) ×3 IMPLANT
OIL CARTRIDGE MAESTRO DRILL (MISCELLANEOUS) ×3
PACK LAMINECTOMY NEURO (CUSTOM PROCEDURE TRAY) ×3 IMPLANT
PAD ARMBOARD 7.5X6 YLW CONV (MISCELLANEOUS) ×6 IMPLANT
ROD RELINE COCR LORD 5.0X35 (Rod) ×3 IMPLANT
ROD RELINE COCR LORD 5X30 (Rod) ×3 IMPLANT
SCREW LOCK RSS 4.5/5.0MM (Screw) ×12 IMPLANT
SCREW SHANK RELINE MOD 5.5X30 (Screw) ×3 IMPLANT
SCREW SHANK RELINE MOD 5.5X35 (Screw) ×6 IMPLANT
SCREW SHANK RELINE MOD 6.5X35 (Screw) ×3 IMPLANT
SPONGE LAP 4X18 RFD (DISPOSABLE) ×3 IMPLANT
SPONGE SURGIFOAM ABS GEL 100 (HEMOSTASIS) ×3 IMPLANT
STRIP CLOSURE SKIN 1/2X4 (GAUZE/BANDAGES/DRESSINGS) IMPLANT
SUT PROLENE 6 0 BV (SUTURE) IMPLANT
SUT VIC AB 0 CT1 18XCR BRD8 (SUTURE) ×2 IMPLANT
SUT VIC AB 0 CT1 8-18 (SUTURE) ×4
SUT VIC AB 2-0 CT1 18 (SUTURE) ×3 IMPLANT
SUT VIC AB 3-0 SH 8-18 (SUTURE) ×6 IMPLANT
TOWEL GREEN STERILE (TOWEL DISPOSABLE) ×3 IMPLANT
TOWEL GREEN STERILE FF (TOWEL DISPOSABLE) ×3 IMPLANT
TRAY FOLEY MTR SLVR 16FR STAT (SET/KITS/TRAYS/PACK) ×3 IMPLANT
WATER STERILE IRR 1000ML POUR (IV SOLUTION) ×3 IMPLANT

## 2019-05-19 NOTE — Anesthesia Preprocedure Evaluation (Signed)
Anesthesia Evaluation  Patient identified by MRN, date of birth, ID band Patient awake    Reviewed: Allergy & Precautions, NPO status , Patient's Chart, lab work & pertinent test results  Airway Mallampati: II  TM Distance: >3 FB Neck ROM: Full    Dental  (+) Dental Advisory Given   Pulmonary former smoker,    breath sounds clear to auscultation       Cardiovascular hypertension, Pt. on medications  Rhythm:Regular Rate:Normal     Neuro/Psych  Neuromuscular disease    GI/Hepatic negative GI ROS, Neg liver ROS,   Endo/Other  diabetes, Type 2Hypothyroidism   Renal/GU negative Renal ROS     Musculoskeletal   Abdominal   Peds  Hematology   Anesthesia Other Findings   Reproductive/Obstetrics                             Lab Results  Component Value Date   WBC 5.5 05/19/2019   HGB 14.1 05/19/2019   HCT 41.8 05/19/2019   MCV 93.9 05/19/2019   PLT 151 05/19/2019   Lab Results  Component Value Date   CREATININE 0.70 05/19/2019   BUN 11 05/19/2019   NA 141 05/19/2019   K 4.2 05/19/2019   CL 109 05/19/2019   CO2 18 (L) 05/19/2019    Anesthesia Physical Anesthesia Plan  ASA: II  Anesthesia Plan: General   Post-op Pain Management:    Induction: Intravenous  PONV Risk Score and Plan: 2 and Dexamethasone, Ondansetron and Treatment may vary due to age or medical condition  Airway Management Planned: Oral ETT  Additional Equipment:   Intra-op Plan:   Post-operative Plan: Extubation in OR  Informed Consent: I have reviewed the patients History and Physical, chart, labs and discussed the procedure including the risks, benefits and alternatives for the proposed anesthesia with the patient or authorized representative who has indicated his/her understanding and acceptance.     Dental advisory given  Plan Discussed with: CRNA  Anesthesia Plan Comments:         Anesthesia  Quick Evaluation

## 2019-05-19 NOTE — Transfer of Care (Signed)
Immediate Anesthesia Transfer of Care Note  Patient: Keith [name]  Procedure(s) Performed: Lumbar four-five Posterior lumbar interbody fusion (N/A Back)  Patient Location: PACU  Anesthesia Type:General  Level of Consciousness: drowsy and patient cooperative  Airway & Oxygen Therapy: Patient Spontanous Breathing and Patient connected to face mask oxygen  Post-op Assessment: Report given to RN and Post -op Vital signs reviewed and stable  Post vital signs: Reviewed and stable  Last Vitals:  Vitals Value Taken Time  BP 158/74 05/19/19 2046  Temp 36.6 C 05/19/19 2045  Pulse 65 05/19/19 2050  Resp 10 05/19/19 2050  SpO2 98 % 05/19/19 2050  Vitals shown include unvalidated device data.  Last Pain:  Vitals:   05/19/19 1227  TempSrc:   PainSc: 0-No pain         Complications: No apparent anesthesia complications

## 2019-05-19 NOTE — Op Note (Signed)
05/19/2019  8:48 PM  PATIENT:  Keith [name]  78 y.o. male with scoliosis and lumbar stenosis with neurogenic claudication L4/5  PRE-OPERATIVE DIAGNOSIS:  Scoliosis of lumbosacral region due to degenerative disc L4/5  POST-OPERATIVE DIAGNOSIS:  Scoliosis of lumbosacral region due to degenerative disc L4/5  PROCEDURE:  Procedure(s): Lumbar four-five Posterior lumbar interbody fusion 68mm x26mmx2 conduit titanium cages(Synthes) filled with autograft morsels at L4/5 Laminectomy L4 in excess of needed exposure for a PLIF to decompress the L4 and L5 nerve roots Non-segmental pedicle screw Realign(nuvasive) fixation L4/5 SURGEON:  Surgeon(s): Ashok Pall, MD Eustace Moore, MD  ASSISTANTS:Jones, Shanon Brow  ANESTHESIA:   general  EBL:  Total I/O In: 800 [I.V.:800] Out: 235 [Urine:120; Blood:115]  BLOOD ADMINISTERED:none  CELL SAVER GIVEN:none  COUNT:per nursing  DRAINS: none   SPECIMEN:  No Specimen  DICTATION: Keith [name] is a 78 y.o. male whom was taken to the operating room intubated, and placed under a general anesthetic without difficulty. A foley catheter was placed under sterile conditions. He was positioned prone on a Jackson stable with all pressure points properly padded.  His lumbar region was prepped and draped in a sterile manner. I infiltrated 20cc's 1/2%lidocaine/1:2000,000 strength epinephrine into the planned incision. I opened the skin with a 10 blade and took the incision down to the thoracolumbar fascia. I exposed the lamina of L4, and L5, and L3 in a subperiosteal fashion bilaterally. I confirmed my location with an intraoperative xray.  I placed self retaining retractors and started the decompression.  I decompressed the spinal canal and the L4, and L5 nerve roots via a near total laminectomy of L4 bilaterally. I used the drill, and Kerrison punches to remove the bone and ligament. I unroofed the lateral recesses and the foramina on each side. I performed  complete inferior L4 facetectomies to aid the decompression.  PLIF's were performed at L4/5 in the same fashion. I opened the disc space with a 15 blade then used a variety of instruments to remove the disc and prepare the space for the arthrodesis. I used curettes, rongeurs, punches, shavers for the disc space, and rasps in the discetomy. I measured the disc space and placed Conduit titanium    cages(Synthes) into the disc space(s).   We placed pedicle screws at L4 And L5, using fluoroscopic guidance. I drilled a pilot hole, then cannulated the pedicle with a drill at each site. We then tapped each pedicle, assessing each site for pedicle violations. No cutouts were appreciated. Screws (Relign, nuvasive) were then placed at each site without difficulty. I attached rods and locking caps with the appropriate tools. The locking caps were secured with torque limited screwdrivers. Final films were performed and the final construct appeared to be in good position.  I closed the wound in a layered fashion. I approximated the thoracolumbar fascia, subcutaneous, and subcuticular planes with vicryl sutures. I used dermabond, and an occlusive bandage for a sterile dressing.     PLAN OF CARE: Admit to inpatient   PATIENT DISPOSITION:  PACU - hemodynamically stable.   Delay start of Pharmacological VTE agent (>24hrs) due to surgical blood loss or risk of bleeding:  yes

## 2019-05-19 NOTE — H&P (Signed)
BP (!) 199/95   Pulse 61   Temp 98.7 F (37.1 C) (Oral)   Resp 18   Ht 5\' 10"  (1.778 m)   Wt 87.4 kg   SpO2 100%   BMI 27.64 kg/m  [facility] on 02/02/2019.  At that time, he reported a 5-year history of left hip pain radiating to the left hip down to the left knee which he describes as shooting pain.  He did undergo x-rays of the knee and of the spine.  That led to his referral to me today.  He says he has had left lower extremity weakness and that the lower extremity has given out.  He did take a prednisone taper.  At that time, he stopped meloxicam and ibuprofen.     VITAL SIGNS :  Height 70 inches, weight 194 pounds, BMI 27.84, blood pressure 138/66, pulse 64, temperature is 98.8.     MEDICATIONS :  Amaryl, Amlodipine, Carteolol eye drops, Cetirizine, Clotrimazole, Betamethasone, Flovent Discus, Gabapentin, Januvia, Lipitor, Losartan, Meloxicam, Metformin, Percocet, Prilosec, Synthroid, Tamsulosin, and I prescribed Tramadol today.     ALLERGIES :  He has no known drug allergies.     REVIEW OF SYSTEMS :  Otherwise quite good.  He does not have any significant problems again other than this pain in the lower extremity.  He is currently retired.  Does not use alcohol.  Has smoked tobacco.  Quit smoking in 2010.  He says he has pain in the lower back, running to the left hip and down the left lower extremity.  Positive for easy bruising, diabetes, thyroid disease, hypertension, fatigue, cancer, neck pain, arthritis, back pain, arm and leg pain, numbness, tingling in the left lower extremity, depression, and lung cancer and bladder cancer present in the family history; both brothers.     PAST SURGICAL HISTORY :  He has undergone cervical surgery 2004 and 2015 performed by myself, bladder surgery 2017, eye surgery in the 1980s.     PHYSICAL EXAMINATION :  General:  He is alert, oriented by 4. Answers all questions appropriately.  Neurologic:  Memory,  language, attention span, and fund of knowledge are normal.  Speech is clear. It is also fluent.  Hearing intact to voice.  Uvula elevates midline.  Shoulder shrug is normal.  Tongue protrudes in the midline.  He has 2+ reflexes, biceps, triceps, brachioradialis, knees, and ankles.  Romberg is negative.  Some difficulty with toe walking and some difficulty with heel walking, but he can do that.  He is able to do a deep knee bend.  Muscle tone, bulk, and coordination otherwise normal.      Mr. Pingree returns with the MRI and plain x-ray.  He has collapsed on the left side at L4-5, significant foraminal and lateral recess stenosis, there is a bit of the anterolisthesis at 4 and 5.  The disc is markedly degenerated.  I do believe he needs the decompression in and around the facet and secondary to essentially taking of the facet to decompress the L4 root and the L5 root, I believe he will need to be arthrodesed with pedicle screws and rods at that level.  I would also expect an interbody device to be placed, but that is a game time decision.  I explained this to Mr. Artzer and his wife.  I also gave him a detailed instruction sheet.

## 2019-05-19 NOTE — Anesthesia Procedure Notes (Signed)
Procedure Name: Intubation Date/Time: 05/19/2019 4:31 PM Performed by: Eligha Bridegroom, CRNA Pre-anesthesia Checklist: Patient identified, Emergency Drugs available, Suction available, Patient being monitored and Timeout performed Patient Re-evaluated:Patient Re-evaluated prior to induction Oxygen Delivery Method: Circle system utilized Preoxygenation: Pre-oxygenation with 100% oxygen Induction Type: IV induction Ventilation: Oral airway inserted - appropriate to patient size and Mask ventilation without difficulty Laryngoscope Size: Mac and 4 Grade View: Grade I Tube type: Oral Tube size: 7.5 mm Number of attempts: 1 Airway Equipment and Method: Stylet Placement Confirmation: ETT inserted through vocal cords under direct vision,  positive ETCO2 and breath sounds checked- equal and bilateral Secured at: 22 cm Tube secured with: Tape Dental Injury: Teeth and Oropharynx as per pre-operative assessment

## 2019-05-20 LAB — GLUCOSE, CAPILLARY
Glucose-Capillary: 232 mg/dL — ABNORMAL HIGH (ref 70–99)
Glucose-Capillary: 217 mg/dL — ABNORMAL HIGH (ref 70–99)
Glucose-Capillary: 231 mg/dL — ABNORMAL HIGH (ref 70–99)

## 2019-05-20 MED ORDER — OXYCODONE HCL 5 MG PO TABS: 5.0000 mg | ORAL_TABLET | ORAL | Status: DC | PRN

## 2019-05-20 MED ORDER — TRAMADOL HCL 50 MG PO TABS
50.0000 mg | ORAL_TABLET | Freq: Four times a day (QID) | ORAL | Status: DC | PRN
  Administered 2019-05-20: 50 mg via ORAL
  Filled 2019-05-20: qty 1

## 2019-05-20 NOTE — Evaluation (Signed)
Occupational Therapy Evaluation Patient Details Name: Keith [name] MRN: [id number] DOB: 1940-12-29 Today's Date: 05/20/2019    History of Present Illness Pt is a 78 year old man admitted 05/19/19 for L4-5 PLIF. PMH: former smoker, glaucoma, cervical surgeries x 2, HTN, DM, hypothyroidism.   Clinical Impression   Pt was functioning independently in self care and reliant on his wife for IADL. Pt educated in back precautions related to ADL and IADL, compensatory strategies for ADL and AE use for LB bathing and dressing. Recommended seated showering for safety vs wife closely supervising standing showering. Pt verbalized understanding. Pt has 24 hour care of his wife.    Follow Up Recommendations  No OT follow up    Equipment Recommendations  None recommended by OT    Recommendations for Other Services       Precautions / Restrictions Precautions Precautions: Back;Fall Precaution Booklet Issued: Yes (comment) Precaution Comments: reviewed back precautions      Mobility Bed Mobility               General bed mobility comments: pt received standing at door  Transfers   Equipment used: None             General transfer comment: supervision for safety    Balance                                           ADL either performed or assessed with clinical judgement   ADL Overall ADL's : Needs assistance/impaired Eating/Feeding: Independent;Sitting     Grooming Details (indicate cue type and reason): educated in two cup method for tooth brushing and to use wash cloth to wash face at sink Upper Body Bathing: Set up;Sitting;With adaptive equipment Upper Body Bathing Details (indicate cue type and reason): instructed to use long handled bath sponge to wash back Lower Body Bathing: Min guard;Sit to/from stand;With adaptive equipment Lower Body Bathing Details (indicate cue type and reason): instructed to use long handled bath sponge for washing  feet Upper Body Dressing : Set up;Sitting   Lower Body Dressing: Sit to/from stand;With adaptive equipment Lower Body Dressing Details (indicate cue type and reason): instructed in use of reacher and sock aide for LB dressing, moderate verbal cues for technique, did not self correct after demo Toilet Transfer: Supervision/safety;Ambulation     Toileting - Clothing Manipulation Details (indicate cue type and reason): instructed to avoid twisting with pericare   Tub/Shower Transfer Details (indicate cue type and reason): recommended seated showering vs wife providing close supervision Functional mobility during ADLs: Supervision/safety(for safety)       Vision Baseline Vision/History: Glaucoma(no visiton in R eye)       Perception     Praxis      Pertinent Vitals/Pain Pain Assessment: Faces Faces Pain Scale: Hurts a little bit Pain Location: back Pain Descriptors / Indicators: Operative site guarding Pain Intervention(s): Limited activity within patient's tolerance;Repositioned     Hand Dominance Right   Extremity/Trunk Assessment Upper Extremity Assessment Upper Extremity Assessment: Overall WFL for tasks assessed   Lower Extremity Assessment Lower Extremity Assessment: Defer to PT evaluation   Cervical / Trunk Assessment Cervical / Trunk Assessment: Other exceptions Cervical / Trunk Exceptions: s/p lumbar sx   Communication Communication Communication: No difficulties   Cognition Arousal/Alertness: Awake/alert Behavior During Therapy: WFL for tasks assessed/performed Overall Cognitive Status: No family/caregiver present to determine baseline cognitive  functioning                                     General Comments       Exercises     Shoulder Instructions      Home Living Family/patient expects to be discharged to:: Private residence Living Arrangements: Spouse/significant other Available Help at Discharge: Family;Available 24  hours/day Type of Home: House             Bathroom Shower/Tub: Teacher, early years/pre: Standard     Home Equipment: Environmental consultant - 2 wheels;Wheelchair - Scientist, physiological: Reacher;Long-handled sponge        Prior Functioning/Environment Level of Independence: Needs assistance  Gait / Transfers Assistance Needed: walked without a device ADL's / Homemaking Assistance Needed: wife does housekeeping and meal prep   Comments: pt does not drive due to glaucoma        OT Problem List: Impaired balance (sitting and/or standing);Pain      OT Treatment/Interventions:      OT Goals(Current goals can be found in the care plan section) Acute Rehab OT Goals Patient Stated Goal: to return home  OT Frequency:     Barriers to D/C:            Co-evaluation              AM-PAC OT "6 Clicks" Daily Activity     Outcome Measure Help from another person eating meals?: None Help from another person taking care of personal grooming?: None Help from another person toileting, which includes using toliet, bedpan, or urinal?: A Little Help from another person bathing (including washing, rinsing, drying)?: A Little Help from another person to put on and taking off regular upper body clothing?: None Help from another person to put on and taking off regular lower body clothing?: A Little 6 Click Score: 21   End of Session    Activity Tolerance: Patient tolerated treatment well Patient left: in chair;with call bell/phone within reach  OT Visit Diagnosis: Other abnormalities of gait and mobility (R26.89);Pain                Time: 0900-0930 OT Time Calculation (min): 30 min Charges:  OT General Charges $OT Visit: 1 Visit OT Evaluation $OT Eval Low Complexity: 1 Low OT Treatments $Self Care/Home Management : 8-22 mins  Nestor Lewandowsky, OTR/L Acute Rehabilitation Services Pager: [phone] Office: [phone]  Malka So 05/20/2019, 10:41 AM

## 2019-05-20 NOTE — Discharge Summary (Signed)
Physician Discharge Summary  Patient ID: KEYMON SANDAHL MRN: [id number] DOB/AGE: 1941-04-25 78 y.o.  Admit date: 05/19/2019 Discharge date: 05/20/2019  Admission Diagnoses:spondylolisthesis L4/5  Discharge Diagnoses: same Active Problems:   Spondylolisthesis of lumbar region   Discharged Condition: good  Hospital Course: Mr. Manjarres was admitted and taken to the operating room for an uncomplicated lumbar decompression and fusion. Post op he is ambulating, voiding, and tolerating a regular diet. His wound is clean, dry, and without signs of infection.   Treatments: surgery: Lumbar four-five Posterior lumbar interbody fusion 1mm x27mmx2 conduit titanium cages(Synthes) filled with autograft morsels at L4/5 Laminectomy L4 in excess of needed exposure for a PLIF to decompress the L4 and L5 nerve roots Non-segmental pedicle screw Realign(nuvasive) fixation L4/5   Discharge Exam: Blood pressure 140/63, pulse 81, temperature 98.1 F (36.7 C), temperature source Oral, resp. rate 16, height 5\' 10"  (1.778 m), weight 87.4 kg, SpO2 95 %. Neurologic: Alert and oriented X 3, normal strength and tone. Normal symmetric reflexes. Normal coordination and gait  Disposition: Discharge disposition: 01-Home or Self Care      Scoliosis of lumbosacral region due to degenerative disc  Allergies as of 05/20/2019      Reactions   Chlorhexidine       Medication List    STOP taking these medications   meloxicam 15 MG tablet Commonly known as: MOBIC   traMADol 50 MG tablet Commonly known as: ULTRAM     TAKE these medications   amLODipine 5 MG tablet Commonly known as: NORVASC Take 5 mg by mouth daily.   atorvastatin 20 MG tablet Commonly known as: LIPITOR Take 20 mg by mouth every morning.   carteolol 1 % ophthalmic solution Commonly known as: OCUPRESS Place 1 drop into the right eye every morning.   cetirizine 10 MG tablet Commonly known as: ZYRTEC Take 10 mg by mouth at  bedtime.   COMPLETE PROSTATE HEALTH PO Take 1 tablet by mouth 2 (two) times daily.   fluticasone 50 MCG/ACT nasal spray Commonly known as: FLONASE Place 2 sprays into both nostrils daily.   gabapentin 600 MG tablet Commonly known as: NEURONTIN Take 600 mg by mouth 3 (three) times daily.   glimepiride 1 MG tablet Commonly known as: AMARYL Take 1 mg by mouth at bedtime.   levothyroxine 100 MCG tablet Commonly known as: SYNTHROID Take 100 mcg by mouth daily before breakfast.   losartan 100 MG tablet Commonly known as: COZAAR Take 100 mg by mouth daily.   multivitamin with minerals tablet Take 1 tablet by mouth daily.   sitaGLIPtin 100 MG tablet Commonly known as: JANUVIA Take 100 mg by mouth every morning.   tamsulosin 0.4 MG Caps capsule Commonly known as: FLOMAX Take 0.4 mg by mouth every morning.   vitamin B-12 1000 MCG tablet Commonly known as: CYANOCOBALAMIN Take 1,000 mcg by mouth daily.      Follow-up Information    Ashok Pall, MD Follow up in 3 week(s).   Specialty: Neurosurgery Why: please call to make an appointment Contact information: [address] [place] [address] [phone]           Signed: Winfield Cunas 05/20/2019, 6:07 PM

## 2019-05-20 NOTE — Discharge Instructions (Addendum)
°  Wound Care Leave incision open to air. You may shower. Do not scrub directly on incision.  Do not put any creams, lotions, or ointments on incision. Activity Walk each and every day, increasing distance each day. No lifting greater than 5 lbs.  Avoid bending, arching, and twisting. No driving for 2 weeks; may ride as a passenger locally. If provided with back brace, wear when out of bed.  It is not necessary to wear in bed. Diet Resume your normal diet.  Return to Work Will be discussed at you follow up appointment. Call Your Doctor If Any of These Occur Redness, drainage, or swelling at the wound.  Temperature greater than 101 degrees. Severe pain not relieved by pain medication. Incision starts to come apart. Follow Up Appt Call today for appointment in 4 weeks [id number]) or for problems.  If you have any hardware placed in your spine, you will need an x-ray before your appointment.           Spinal Fusion Care After Refer to this sheet in the next few weeks. These instructions provide you with information on caring for yourself after your procedure. Your caregiver may also give you more specific instructions. Your treatment has been planned according to current medical practices, but problems sometimes occur. Call your caregiver if you have any problems or questions after your procedure. HOME CARE INSTRUCTIONS   Take whatever pain medicine has been prescribed by your caregiver. Do not take over-the-counter pain medicine unless directed otherwise by your caregiver.   Do not drive if you are taking narcotic pain medicines.   Change your bandage (dressing) if necessary or as directed by your caregiver.   You may shower. The wound may get wet, simply pat the area dry. It will take ~2 weeks for the glue to peel off.  If you have been prescribed medicine to prevent your blood from clotting, follow the directions carefully.   Check the area around your incision often. Look  for redness and swelling. Also, look for anything leaking from your wound. You can use a mirror or have a family member inspect your incision if it is in a place where it is difficult for you to see.   Ask your caregiver what activities you should avoid and for how long.   Walk as much as possible.   Do not lift anything heavier than 5 lbs until your caregiver says it is safe.   Do not twist or bend for a few weeks. Try not to pull on things. Avoid sitting for long periods of time. Change positions at least every hour.

## 2019-05-20 NOTE — Anesthesia Postprocedure Evaluation (Signed)
Anesthesia Post Note  Patient: Keith [name]  Procedure(s) Performed: Lumbar four-five Posterior lumbar interbody fusion (N/A Back)     Patient location during evaluation: PACU Anesthesia Type: General Level of consciousness: awake and alert Pain management: pain level controlled Vital Signs Assessment: post-procedure vital signs reviewed and stable Respiratory status: spontaneous breathing, nonlabored ventilation, respiratory function stable and patient connected to nasal cannula oxygen Cardiovascular status: blood pressure returned to baseline and stable Postop Assessment: no apparent nausea or vomiting Anesthetic complications: no    Last Vitals:  Vitals:   05/19/19 2212 05/19/19 2315  BP: (!) 166/76 (!) 159/69  Pulse: 65 75  Resp: 20 20  Temp: (!) 36.4 C 36.9 C  SpO2: 95% 94%    Last Pain:  Vitals:   05/19/19 2315  TempSrc: Oral  PainSc:                  Effie Berkshire

## 2019-05-20 NOTE — Progress Notes (Signed)
Discharged instructions/education/Rx/AVS given to patient with daughter at bedside and they both verbalized understanding. MAE well, ambulating well with supervision. Voiding and emptying bladder well. No swelling, no drainage, no redness noted on incision site. Patient awaiting for his transport.

## 2019-05-20 NOTE — Evaluation (Signed)
Physical Therapy Evaluation Patient Details Name: Keith [name] MRN: [id number] DOB: 1941/02/21 Today's Date: 05/20/2019   History of Present Illness  Pt is a 78 year old man admitted 05/19/19 for L4-5 PLIF. PMH: former smoker, glaucoma, cervical surgeries x 2, HTN, DM, hypothyroidism.  Clinical Impression  Pt admitted with above diagnosis. At the time of PT eval, pt was able to demonstrate transfers and ambulation with gross min guard assist to min assist with no AD. Pt was educated on precautions, appropriate activity progression, positioning recommendations, and car transfer. Pt currently without brace in room however brace vendor to bring after session. Pt currently with functional limitations due to the deficits listed below (see PT Problem List). Pt will benefit from skilled PT to increase their independence and safety with mobility to allow discharge to the venue listed below.      Follow Up Recommendations No PT follow up;Supervision for mobility/OOB    Equipment Recommendations  None recommended by PT    Recommendations for Other Services       Precautions / Restrictions Precautions Precautions: Back;Fall Precaution Booklet Issued: Yes (comment) Precaution Comments: Reviewed handout in detail, and pt was cued for maintenance of precautions during functional mobility.  Required Braces or Orthoses: Spinal Brace Spinal Brace: Lumbar corset(Not present in room)      Mobility  Bed Mobility Overal bed mobility: Needs Assistance Bed Mobility: Rolling;Sidelying to Sit Rolling: Supervision Sidelying to sit: Supervision       General bed mobility comments: VC's for proper log roll technique. Pt determined to sit straight up in the bed despite education for log roll.   Transfers Overall transfer level: Needs assistance Equipment used: None Transfers: Sit to/from Stand Sit to Stand: Supervision         General transfer comment: supervision for  safety  Ambulation/Gait Ambulation/Gait assistance: Min guard;Min assist;Supervision Gait Distance (Feet): 400 Feet Assistive device: 1 person hand held assist;None Gait Pattern/deviations: Step-through pattern;Decreased stride length Gait velocity: Decreased Gait velocity interpretation: <1.8 ft/sec, indicate of risk for recurrent falls General Gait Details: Unsteady with occasional trip/stagger. HHA initially progressing to min guard assist/close supervision however therapist maintained close distance due to unsteadiness.   Stairs            Wheelchair Mobility    Modified Rankin (Stroke Patients Only)       Balance Overall balance assessment: Needs assistance Sitting-balance support: Feet supported;No upper extremity supported Sitting balance-Leahy Scale: Fair     Standing balance support: No upper extremity supported;During functional activity Standing balance-Leahy Scale: Poor Standing balance comment: occasional unsteadiness/LOB                             Pertinent Vitals/Pain Pain Assessment: Faces Faces Pain Scale: Hurts a little bit Pain Location: back Pain Descriptors / Indicators: Operative site guarding Pain Intervention(s): Monitored during session    Home Living Family/patient expects to be discharged to:: Private residence Living Arrangements: Spouse/significant other Available Help at Discharge: Family;Available 24 hours/day Type of Home: House         Home Equipment: Walker - 2 wheels;Wheelchair - Psychologist, educational      Prior Function Level of Independence: Equities trader / Transfers Assistance Needed: walked without a device  ADL's / Homemaking Assistance Needed: wife does housekeeping and meal prep  Comments: pt does not drive due to glaucoma     Hand Dominance   Dominant Hand: Right    Extremity/Trunk  Assessment   Upper Extremity Assessment Upper Extremity Assessment: Overall WFL for tasks assessed     Lower Extremity Assessment Lower Extremity Assessment: Generalized weakness(Consistent with pre-op diagnosis)    Cervical / Trunk Assessment Cervical / Trunk Assessment: Other exceptions Cervical / Trunk Exceptions: s/p lumbar sx  Communication   Communication: No difficulties  Cognition Arousal/Alertness: Awake/alert Behavior During Therapy: WFL for tasks assessed/performed Overall Cognitive Status: No family/caregiver present to determine baseline cognitive functioning                                        General Comments      Exercises     Assessment/Plan    PT Assessment Patient needs continued PT services  PT Problem List Decreased strength;Decreased activity tolerance;Decreased balance;Decreased mobility;Decreased knowledge of use of DME;Decreased safety awareness;Decreased knowledge of precautions;Pain       PT Treatment Interventions DME instruction;Gait training;Functional mobility training;Therapeutic activities;Therapeutic exercise;Neuromuscular re-education;Patient/family education    PT Goals (Current goals can be found in the Care Plan section)  Acute Rehab PT Goals Patient Stated Goal: to return home PT Goal Formulation: With patient Time For Goal Achievement: 05/27/19 Potential to Achieve Goals: Good    Frequency Min 5X/week   Barriers to discharge        Co-evaluation               AM-PAC PT "6 Clicks" Mobility  Outcome Measure Help needed turning from your back to your side while in a flat bed without using bedrails?: None Help needed moving from lying on your back to sitting on the side of a flat bed without using bedrails?: A Little Help needed moving to and from a bed to a chair (including a wheelchair)?: A Little Help needed standing up from a chair using your arms (e.g., wheelchair or bedside chair)?: A Little Help needed to walk in hospital room?: A Little Help needed climbing 3-5 steps with a railing? : A  Little 6 Click Score: 19    End of Session Equipment Utilized During Treatment: Gait belt Activity Tolerance: Patient tolerated treatment well Patient left: in chair;with call bell/phone within reach Nurse Communication: Mobility status PT Visit Diagnosis: Unsteadiness on feet (R26.81);Pain Pain - part of body: (back)    Time: [id number] PT Time Calculation (min) (ACUTE ONLY): 20 min   Charges:   PT Evaluation $PT Eval Moderate Complexity: 1 Mod          Rolinda Roan, PT, DPT Acute Rehabilitation Services Pager: [phone] Office: [phone]   Thelma Comp 05/20/2019, 12:04 PM

## 2019-06-10 DIAGNOSIS — M4157 Other secondary scoliosis, lumbosacral region: Secondary | ICD-10-CM | POA: Diagnosis not present

## 2019-06-10 DIAGNOSIS — M4726 Other spondylosis with radiculopathy, lumbar region: Secondary | ICD-10-CM | POA: Diagnosis not present

## 2019-06-17 DIAGNOSIS — I10 Essential (primary) hypertension: Secondary | ICD-10-CM | POA: Diagnosis not present

## 2019-06-17 DIAGNOSIS — E118 Type 2 diabetes mellitus with unspecified complications: Secondary | ICD-10-CM | POA: Diagnosis not present

## 2019-06-17 DIAGNOSIS — Z125 Encounter for screening for malignant neoplasm of prostate: Secondary | ICD-10-CM | POA: Diagnosis not present

## 2019-06-21 DIAGNOSIS — D509 Iron deficiency anemia, unspecified: Secondary | ICD-10-CM | POA: Diagnosis not present

## 2019-06-24 DIAGNOSIS — E7800 Pure hypercholesterolemia, unspecified: Secondary | ICD-10-CM | POA: Diagnosis not present

## 2019-06-24 DIAGNOSIS — E118 Type 2 diabetes mellitus with unspecified complications: Secondary | ICD-10-CM | POA: Diagnosis not present

## 2019-06-24 DIAGNOSIS — E039 Hypothyroidism, unspecified: Secondary | ICD-10-CM | POA: Diagnosis not present

## 2019-06-24 DIAGNOSIS — K219 Gastro-esophageal reflux disease without esophagitis: Secondary | ICD-10-CM | POA: Diagnosis not present

## 2019-06-24 DIAGNOSIS — I10 Essential (primary) hypertension: Secondary | ICD-10-CM | POA: Diagnosis not present

## 2019-06-24 DIAGNOSIS — N529 Male erectile dysfunction, unspecified: Secondary | ICD-10-CM | POA: Diagnosis not present

## 2019-07-02 DIAGNOSIS — H401111 Primary open-angle glaucoma, right eye, mild stage: Secondary | ICD-10-CM | POA: Diagnosis not present

## 2019-09-03 DIAGNOSIS — Z20828 Contact with and (suspected) exposure to other viral communicable diseases: Secondary | ICD-10-CM | POA: Diagnosis not present

## 2019-09-29 DIAGNOSIS — C44311 Basal cell carcinoma of skin of nose: Secondary | ICD-10-CM | POA: Diagnosis not present

## 2019-10-27 DIAGNOSIS — Z85828 Personal history of other malignant neoplasm of skin: Secondary | ICD-10-CM | POA: Diagnosis not present

## 2019-10-27 DIAGNOSIS — Z08 Encounter for follow-up examination after completed treatment for malignant neoplasm: Secondary | ICD-10-CM | POA: Diagnosis not present

## 2019-12-23 DIAGNOSIS — M4726 Other spondylosis with radiculopathy, lumbar region: Secondary | ICD-10-CM | POA: Diagnosis not present

## 2019-12-28 DIAGNOSIS — L089 Local infection of the skin and subcutaneous tissue, unspecified: Secondary | ICD-10-CM | POA: Diagnosis not present

## 2019-12-28 DIAGNOSIS — L723 Sebaceous cyst: Secondary | ICD-10-CM | POA: Diagnosis not present

## 2020-01-04 DIAGNOSIS — E038 Other specified hypothyroidism: Secondary | ICD-10-CM | POA: Diagnosis not present

## 2020-01-04 DIAGNOSIS — Z1331 Encounter for screening for depression: Secondary | ICD-10-CM | POA: Diagnosis not present

## 2020-01-04 DIAGNOSIS — E1169 Type 2 diabetes mellitus with other specified complication: Secondary | ICD-10-CM | POA: Diagnosis not present

## 2020-01-04 DIAGNOSIS — E7849 Other hyperlipidemia: Secondary | ICD-10-CM | POA: Diagnosis not present

## 2020-01-04 DIAGNOSIS — Z8551 Personal history of malignant neoplasm of bladder: Secondary | ICD-10-CM | POA: Diagnosis not present

## 2020-01-06 DIAGNOSIS — M48061 Spinal stenosis, lumbar region without neurogenic claudication: Secondary | ICD-10-CM | POA: Diagnosis not present

## 2020-01-06 DIAGNOSIS — M4726 Other spondylosis with radiculopathy, lumbar region: Secondary | ICD-10-CM | POA: Diagnosis not present

## 2020-01-11 DIAGNOSIS — M5126 Other intervertebral disc displacement, lumbar region: Secondary | ICD-10-CM | POA: Diagnosis not present

## 2020-01-11 DIAGNOSIS — I10 Essential (primary) hypertension: Secondary | ICD-10-CM | POA: Diagnosis not present

## 2020-01-11 DIAGNOSIS — Z6827 Body mass index (BMI) 27.0-27.9, adult: Secondary | ICD-10-CM | POA: Diagnosis not present

## 2020-01-25 ENCOUNTER — Other Ambulatory Visit: Payer: Self-pay | Admitting: Neurosurgery

## 2020-02-07 NOTE — Progress Notes (Signed)
[id number] - [place], [place] - [address] [place] [address] Phone: [phone] Fax: [facility] - [place] [place] [address] Phone: [phone] Fax: [phone]      Your procedure is scheduled on Thursday 02/10/2020.  Report to [facility] Main Entrance "A" at 10:30 A.M., and check in at the Admitting office.  Call this number if you have problems the morning of surgery:  [phone]  Call [phone] if you have any questions prior to your surgery date Monday-Friday 8am-4pm    Remember:  Do not eat or drink after midnight the night before your surgery    Take these medicines the morning of surgery with A SIP OF WATER: Amlodipine (Norvasc) Carteolol (Ocupress) eye drop Gabapentin (Neurontin) Levothyroxine (Synthroid) Tamsulosin (Flomax)  If needed you may take these medications the morning of surgery with a sip of water: Acetaminophen (Tylenol) Fluticasone (Flonase) nasal spray Oxycodone HCl   As of today, STOP taking any Meloxicam (Mobic), Aspirin (unless otherwise instructed by your surgeon) and Aspirin containing products, Aleve, Naproxen, Ibuprofen, Motrin, Advil, Goody's, BC's, all herbal medications, fish oil, and all vitamins.   WHAT DO I DO ABOUT MY DIABETES MEDICATION?   . THE DAY BEFORE SURGERY, you may take your morning and lunch doses of Glimeparide (Amaryl) and your morning dose of Sitagliptin (Januvia).   . Do not take oral diabetes medicines (pills) the morning of surgery.    HOW TO MANAGE YOUR DIABETES BEFORE AND AFTER SURGERY  Why is it important to control my blood sugar before and after surgery? . Improving blood sugar levels before and after surgery helps healing and can limit problems. . A way of improving blood sugar control is eating a healthy diet by: o  Eating less sugar and carbohydrates o  Increasing  activity/exercise o  Talking with your doctor about reaching your blood sugar goals . High blood sugars (greater than 180 mg/dL) can raise your risk of infections and slow your recovery, so you will need to focus on controlling your diabetes during the weeks before surgery. . Make sure that the doctor who takes care of your diabetes knows about your planned surgery including the date and location.  How do I manage my blood sugar before surgery? . Check your blood sugar at least 4 times a day, starting 2 days before surgery, to make sure that the level is not too high or low. . Check your blood sugar the morning of your surgery when you wake up and every 2 hours until you get to the Short Stay unit. o If your blood sugar is less than 70 mg/dL, you will need to treat for low blood sugar: - Do not take insulin. - Treat a low blood sugar (less than 70 mg/dL) with  cup of clear juice (cranberry or apple), 4 glucose tablets, OR glucose gel. - Recheck blood sugar in 15 minutes after treatment (to make sure it is greater than 70 mg/dL). If your blood sugar is not greater than 70 mg/dL on recheck, call [phone] for further instructions. . Report your blood sugar to the short stay nurse when you get to Short Stay.  . If you are admitted to the hospital after surgery: o Your blood sugar will be checked by the staff and you will probably be given insulin after surgery (instead of oral diabetes medicines) to make sure you have good blood sugar levels.  o The goal for blood sugar control after surgery is 80-180 mg/dL.                       Do not wear jewelry, make up, or nail polish            Do not wear lotions, powders, colognes, or deodorant.            Men may shave face and neck.            Do not bring valuables to the hospital.            [facility] is not responsible for any belongings or valuables.  Do NOT Smoke (Tobacco/Vapping) or drink Alcohol 24 hours prior to your procedure  If you  use a CPAP at night, you may bring all equipment for your overnight stay.   Contacts, glasses, dentures or bridgework may not be worn into surgery.      For patients admitted to the hospital, discharge time will be determined by your treatment team.   Patients discharged the day of surgery will not be allowed to drive home, and someone needs to stay with them for 24 hours.    Special instructions:   [place]- Preparing For Surgery  Before surgery, you can play an important role. Because skin is not sterile, your skin needs to be as free of germs as possible. You can reduce the number of germs on your skin by washing with CHG (chlorahexidine gluconate) Soap before surgery.  CHG is an antiseptic cleaner which kills germs and bonds with the skin to continue killing germs even after washing.    Oral Hygiene is also important to reduce your risk of infection.  Remember - BRUSH YOUR TEETH THE MORNING OF SURGERY WITH YOUR REGULAR TOOTHPASTE  Please do not use if you have an allergy to CHG or antibacterial soaps. If your skin becomes reddened/irritated stop using the CHG.  Do not shave (including legs and underarms) for at least 48 hours prior to first CHG shower. It is OK to shave your face.  Please follow these instructions carefully.   1. Shower the NIGHT BEFORE SURGERY and the MORNING OF SURGERY with CHG Soap.   2. If you chose to wash your hair, wash your hair first as usual with your normal shampoo.  3. After you shampoo, rinse your hair and body thoroughly to remove the shampoo.  4. Use CHG as you would any other liquid soap. You can apply CHG directly to the skin and wash gently with a scrungie or a clean washcloth.   5. Apply the CHG Soap to your body ONLY FROM THE NECK DOWN.  Do not use on open wounds or open sores. Avoid contact with your eyes, ears, mouth and genitals (private parts). Wash Face and genitals (private parts)  with your normal soap.   6. Wash thoroughly, paying  special attention to the area where your surgery will be performed.  7. Thoroughly rinse your body with warm water from the neck down.  8. DO NOT shower/wash with your normal soap after using and rinsing off the CHG Soap.  9. Pat yourself dry with a CLEAN TOWEL.  10. Wear CLEAN PAJAMAS to bed the night before surgery, wear comfortable clothes the morning of surgery  11. Place CLEAN SHEETS on your bed the night of your first shower and DO NOT SLEEP WITH PETS.   Day of Surgery:   Do not apply  any deodorants/lotions.  Please wear clean clothes to the hospital/surgery center.   Remember to brush your teeth WITH YOUR REGULAR TOOTHPASTE.   Please read over the following fact sheets that you were given.

## 2020-02-08 ENCOUNTER — Encounter (HOSPITAL_COMMUNITY): Payer: Self-pay

## 2020-02-08 ENCOUNTER — Other Ambulatory Visit: Payer: Self-pay

## 2020-02-08 ENCOUNTER — Other Ambulatory Visit (HOSPITAL_COMMUNITY)
Admission: RE | Admit: 2020-02-08 | Discharge: 2020-02-08 | Disposition: A | Discharge status: Home / Self Care | Payer: Medicare HMO | Source: Ambulatory Visit | Attending: Neurosurgery | Admitting: Neurosurgery

## 2020-02-08 ENCOUNTER — Encounter (HOSPITAL_COMMUNITY)
Admission: RE | Admit: 2020-02-08 | Discharge: 2020-02-08 | Disposition: A | Discharge status: Home / Self Care | Payer: Medicare HMO | Source: Ambulatory Visit | Attending: Neurosurgery | Admitting: Neurosurgery

## 2020-02-08 DIAGNOSIS — Z79899 Other long term (current) drug therapy: Secondary | ICD-10-CM | POA: Diagnosis not present

## 2020-02-08 DIAGNOSIS — M5116 Intervertebral disc disorders with radiculopathy, lumbar region: Secondary | ICD-10-CM | POA: Diagnosis not present

## 2020-02-08 DIAGNOSIS — M4326 Fusion of spine, lumbar region: Secondary | ICD-10-CM | POA: Diagnosis not present

## 2020-02-08 DIAGNOSIS — E785 Hyperlipidemia, unspecified: Secondary | ICD-10-CM | POA: Diagnosis not present

## 2020-02-08 DIAGNOSIS — I10 Essential (primary) hypertension: Secondary | ICD-10-CM | POA: Diagnosis not present

## 2020-02-08 DIAGNOSIS — M4316 Spondylolisthesis, lumbar region: Secondary | ICD-10-CM | POA: Diagnosis not present

## 2020-02-08 DIAGNOSIS — Z8551 Personal history of malignant neoplasm of bladder: Secondary | ICD-10-CM | POA: Diagnosis not present

## 2020-02-08 DIAGNOSIS — Z7984 Long term (current) use of oral hypoglycemic drugs: Secondary | ICD-10-CM | POA: Diagnosis not present

## 2020-02-08 DIAGNOSIS — M5126 Other intervertebral disc displacement, lumbar region: Secondary | ICD-10-CM | POA: Diagnosis not present

## 2020-02-08 DIAGNOSIS — Z8249 Family history of ischemic heart disease and other diseases of the circulatory system: Secondary | ICD-10-CM | POA: Diagnosis not present

## 2020-02-08 DIAGNOSIS — Z87891 Personal history of nicotine dependence: Secondary | ICD-10-CM | POA: Diagnosis not present

## 2020-02-08 DIAGNOSIS — Z79890 Hormone replacement therapy (postmenopausal): Secondary | ICD-10-CM | POA: Diagnosis not present

## 2020-02-08 DIAGNOSIS — Z791 Long term (current) use of non-steroidal anti-inflammatories (NSAID): Secondary | ICD-10-CM | POA: Diagnosis not present

## 2020-02-08 DIAGNOSIS — E039 Hypothyroidism, unspecified: Secondary | ICD-10-CM | POA: Diagnosis not present

## 2020-02-08 DIAGNOSIS — E119 Type 2 diabetes mellitus without complications: Secondary | ICD-10-CM | POA: Diagnosis not present

## 2020-02-08 DIAGNOSIS — Z20822 Contact with and (suspected) exposure to covid-19: Secondary | ICD-10-CM | POA: Diagnosis not present

## 2020-02-08 DIAGNOSIS — H409 Unspecified glaucoma: Secondary | ICD-10-CM | POA: Diagnosis not present

## 2020-02-08 DIAGNOSIS — M5416 Radiculopathy, lumbar region: Secondary | ICD-10-CM | POA: Diagnosis not present

## 2020-02-08 LAB — CBC
HCT: 43 % (ref 39.0–52.0)
Hemoglobin: 14.2 g/dL (ref 13.0–17.0)
MCH: 30.5 pg (ref 26.0–34.0)
MCHC: 33 g/dL (ref 30.0–36.0)
MCV: 92.3 fL (ref 80.0–100.0)
Platelets: 175 10*3/uL (ref 150–400)
RBC: 4.66 MIL/uL (ref 4.22–5.81)
RDW: 12.7 % (ref 11.5–15.5)
WBC: 7.1 10*3/uL (ref 4.0–10.5)
nRBC: 0 % (ref 0.0–0.2)

## 2020-02-08 LAB — BASIC METABOLIC PANEL
Anion gap: 11 (ref 5–15)
BUN: 14 mg/dL (ref 8–23)
CO2: 24 mmol/L (ref 22–32)
Calcium: 9.3 mg/dL (ref 8.9–10.3)
Chloride: 104 mmol/L (ref 98–111)
Creatinine, Ser: 0.8 mg/dL (ref 0.61–1.24)
GFR calc Af Amer: >60 mL/min (ref >60)
GFR calc non Af Amer: >60 mL/min (ref >60)
Glucose, Bld: 186 mg/dL — ABNORMAL HIGH (ref 70–99)
Potassium: 3.9 mmol/L (ref 3.5–5.1)
Sodium: 139 mmol/L (ref 135–145)

## 2020-02-08 LAB — GLUCOSE, CAPILLARY: Glucose-Capillary: 194 mg/dL — ABNORMAL HIGH (ref 70–99)

## 2020-02-08 LAB — TYPE AND SCREEN
ABO/RH(D): A NEG
Antibody Screen: NEGATIVE

## 2020-02-08 LAB — SURGICAL PCR SCREEN
MRSA, PCR: NEGATIVE
Staphylococcus aureus: NEGATIVE

## 2020-02-08 LAB — SARS CORONAVIRUS 2 (TAT 6-24 HRS): SARS Coronavirus 2: NEGATIVE

## 2020-02-08 NOTE — Progress Notes (Addendum)
PCP - Dr. Jani Gravel with [facility] Cardiologist - Denies  Chest x-ray - Not indicated EKG - 04/20/19 Stress Test - Denies  ECHO - Denies Cardiac Cath - Deneis  Sleep Study - Had one not diagnosed  DM - Yes PAT appt CBG 194 had eaten Fasting Blood Sugar -  Checks Blood Sugar 1x times a day ranges 160-170's  Aspirin Instructions:Given  COVID TEST-  02/08/20  Anesthesia review: Yes prior EKG abn  Patient denies shortness of breath, fever, cough and chest pain at PAT appointment   All instructions explained to the patient, with a verbal understanding of the material. Patient agrees to go over the instructions while at home for a better understanding. Patient also instructed to self quarantine after being tested for COVID-19. The opportunity to ask questions was provided.

## 2020-02-09 LAB — HEMOGLOBIN A1C
Hgb A1c MFr Bld: 8.4 % — ABNORMAL HIGH (ref 4.8–5.6)
Mean Plasma Glucose: 194 mg/dL

## 2020-02-10 ENCOUNTER — Inpatient Hospital Stay (HOSPITAL_COMMUNITY): Payer: Medicare HMO | Admitting: Physician Assistant

## 2020-02-10 ENCOUNTER — Inpatient Hospital Stay (HOSPITAL_COMMUNITY)
Admission: RE | Admit: 2020-02-10 | Discharge: 2020-02-11 | DRG: 455 | Disposition: A | Discharge status: Home / Self Care | Payer: Medicare HMO | Attending: Neurosurgery | Admitting: Neurosurgery

## 2020-02-10 ENCOUNTER — Inpatient Hospital Stay (HOSPITAL_COMMUNITY): Payer: Medicare HMO | Admitting: Certified Registered Nurse Anesthetist

## 2020-02-10 ENCOUNTER — Other Ambulatory Visit: Payer: Self-pay

## 2020-02-10 ENCOUNTER — Encounter (HOSPITAL_COMMUNITY)
Admission: RE | Disposition: A | Discharge status: Home / Self Care | Payer: Self-pay | Source: Home / Self Care | Attending: Neurosurgery

## 2020-02-10 ENCOUNTER — Inpatient Hospital Stay (HOSPITAL_COMMUNITY): Payer: Medicare HMO

## 2020-02-10 ENCOUNTER — Encounter (HOSPITAL_COMMUNITY): Payer: Self-pay | Admitting: Neurosurgery

## 2020-02-10 DIAGNOSIS — Z8249 Family history of ischemic heart disease and other diseases of the circulatory system: Secondary | ICD-10-CM

## 2020-02-10 DIAGNOSIS — M5126 Other intervertebral disc displacement, lumbar region: Principal | ICD-10-CM | POA: Diagnosis present

## 2020-02-10 DIAGNOSIS — Z20822 Contact with and (suspected) exposure to covid-19: Secondary | ICD-10-CM | POA: Diagnosis present

## 2020-02-10 DIAGNOSIS — Z87891 Personal history of nicotine dependence: Secondary | ICD-10-CM

## 2020-02-10 DIAGNOSIS — E785 Hyperlipidemia, unspecified: Secondary | ICD-10-CM | POA: Diagnosis present

## 2020-02-10 DIAGNOSIS — Z7984 Long term (current) use of oral hypoglycemic drugs: Secondary | ICD-10-CM

## 2020-02-10 DIAGNOSIS — E119 Type 2 diabetes mellitus without complications: Secondary | ICD-10-CM | POA: Diagnosis present

## 2020-02-10 DIAGNOSIS — H409 Unspecified glaucoma: Secondary | ICD-10-CM | POA: Diagnosis present

## 2020-02-10 DIAGNOSIS — Z79890 Hormone replacement therapy (postmenopausal): Secondary | ICD-10-CM

## 2020-02-10 DIAGNOSIS — I10 Essential (primary) hypertension: Secondary | ICD-10-CM | POA: Diagnosis present

## 2020-02-10 DIAGNOSIS — Z8551 Personal history of malignant neoplasm of bladder: Secondary | ICD-10-CM

## 2020-02-10 DIAGNOSIS — E039 Hypothyroidism, unspecified: Secondary | ICD-10-CM | POA: Diagnosis present

## 2020-02-10 DIAGNOSIS — Z791 Long term (current) use of non-steroidal anti-inflammatories (NSAID): Secondary | ICD-10-CM | POA: Diagnosis not present

## 2020-02-10 DIAGNOSIS — Z79899 Other long term (current) drug therapy: Secondary | ICD-10-CM | POA: Diagnosis not present

## 2020-02-10 DIAGNOSIS — Z419 Encounter for procedure for purposes other than remedying health state, unspecified: Secondary | ICD-10-CM

## 2020-02-10 DIAGNOSIS — M4326 Fusion of spine, lumbar region: Secondary | ICD-10-CM | POA: Diagnosis not present

## 2020-02-10 LAB — CREATININE, SERUM
Creatinine, Ser: 0.78 mg/dL (ref 0.61–1.24)
GFR calc Af Amer: >60 mL/min (ref >60)
GFR calc non Af Amer: >60 mL/min (ref >60)

## 2020-02-10 LAB — CBC
HCT: 37.8 % — ABNORMAL LOW (ref 39.0–52.0)
Hemoglobin: 12.5 g/dL — ABNORMAL LOW (ref 13.0–17.0)
MCH: 30.6 pg (ref 26.0–34.0)
MCHC: 33.1 g/dL (ref 30.0–36.0)
MCV: 92.4 fL (ref 80.0–100.0)
Platelets: 151 10*3/uL (ref 150–400)
RBC: 4.09 MIL/uL — ABNORMAL LOW (ref 4.22–5.81)
RDW: 12.6 % (ref 11.5–15.5)
WBC: 9.9 10*3/uL (ref 4.0–10.5)
nRBC: 0 % (ref 0.0–0.2)

## 2020-02-10 LAB — GLUCOSE, CAPILLARY
Glucose-Capillary: 156 mg/dL — ABNORMAL HIGH (ref 70–99)
Glucose-Capillary: 126 mg/dL — ABNORMAL HIGH (ref 70–99)
Glucose-Capillary: 115 mg/dL — ABNORMAL HIGH (ref 70–99)
Glucose-Capillary: 115 mg/dL — ABNORMAL HIGH (ref 70–99)
Glucose-Capillary: 174 mg/dL — ABNORMAL HIGH (ref 70–99)

## 2020-02-10 SURGERY — POSTERIOR LUMBAR FUSION 1 LEVEL
Anesthesia: General | Site: Spine Lumbar

## 2020-02-10 MED ORDER — PHENYLEPHRINE HCL (PRESSORS) 10 MG/ML IV SOLN: INTRAVENOUS | Status: DC | PRN

## 2020-02-10 MED ORDER — SODIUM CHLORIDE 0.9% FLUSH: 3.0000 mL | INTRAVENOUS | Status: DC | PRN

## 2020-02-10 MED ORDER — BISACODYL 5 MG PO TBEC: 5.0000 mg | DELAYED_RELEASE_TABLET | Freq: Every day | ORAL | Status: DC | PRN

## 2020-02-10 MED ORDER — ONDANSETRON HCL 4 MG/2ML IJ SOLN
INTRAMUSCULAR | Status: AC
  Filled 2020-02-10: qty 2

## 2020-02-10 MED ORDER — ALBUMIN HUMAN 5 % IV SOLN: INTRAVENOUS | Status: DC | PRN

## 2020-02-10 MED ORDER — PROPOFOL 10 MG/ML IV BOLUS
INTRAVENOUS | Status: DC | PRN
  Administered 2020-02-10: 90 mg via INTRAVENOUS

## 2020-02-10 MED ORDER — MAGNESIUM CITRATE PO SOLN: 1.0000 | Freq: Once | ORAL | Status: DC | PRN

## 2020-02-10 MED ORDER — SODIUM CHLORIDE 0.9% FLUSH
3.0000 mL | Freq: Two times a day (BID) | INTRAVENOUS | Status: DC
  Administered 2020-02-10: 3 mL via INTRAVENOUS

## 2020-02-10 MED ORDER — LIDOCAINE-EPINEPHRINE 0.5 %-1:200000 IJ SOLN
INTRAMUSCULAR | Status: AC
  Filled 2020-02-10: qty 1

## 2020-02-10 MED ORDER — LACTATED RINGERS IV SOLN: INTRAVENOUS | Status: DC

## 2020-02-10 MED ORDER — ORAL CARE MOUTH RINSE: 15.0000 mL | Freq: Once | OROMUCOSAL | Status: AC

## 2020-02-10 MED ORDER — PHENYLEPHRINE HCL-NACL 10-0.9 MG/250ML-% IV SOLN
INTRAVENOUS | Status: DC | PRN
  Administered 2020-02-10: 20 ug/min via INTRAVENOUS

## 2020-02-10 MED ORDER — ROCURONIUM BROMIDE 10 MG/ML (PF) SYRINGE
PREFILLED_SYRINGE | INTRAVENOUS | Status: AC
  Filled 2020-02-10: qty 10

## 2020-02-10 MED ORDER — AMLODIPINE BESYLATE 5 MG PO TABS
10.0000 mg | ORAL_TABLET | Freq: Every day | ORAL | Status: DC
  Administered 2020-02-11: 10 mg via ORAL
  Filled 2020-02-10: qty 2

## 2020-02-10 MED ORDER — CHLORHEXIDINE GLUCONATE 0.12 % MT SOLN: 15.0000 mL | Freq: Once | OROMUCOSAL | Status: AC

## 2020-02-10 MED ORDER — BUPIVACAINE HCL (PF) 0.5 % IJ SOLN
INTRAMUSCULAR | Status: AC
  Filled 2020-02-10: qty 30

## 2020-02-10 MED ORDER — MIDAZOLAM HCL 2 MG/2ML IJ SOLN
INTRAMUSCULAR | Status: AC
  Filled 2020-02-10: qty 2

## 2020-02-10 MED ORDER — FLUTICASONE PROPIONATE 50 MCG/ACT NA SUSP
2.0000 | Freq: Every day | NASAL | Status: DC | PRN
  Filled 2020-02-10: qty 16

## 2020-02-10 MED ORDER — ONDANSETRON HCL 4 MG PO TABS: 4.0000 mg | ORAL_TABLET | Freq: Four times a day (QID) | ORAL | Status: DC | PRN

## 2020-02-10 MED ORDER — HEPARIN SODIUM (PORCINE) 5000 UNIT/ML IJ SOLN
5000.0000 [IU] | Freq: Three times a day (TID) | INTRAMUSCULAR | Status: DC
  Administered 2020-02-11: 5000 [IU] via SUBCUTANEOUS
  Filled 2020-02-10: qty 1

## 2020-02-10 MED ORDER — PHENYLEPHRINE 40 MCG/ML (10ML) SYRINGE FOR IV PUSH (FOR BLOOD PRESSURE SUPPORT)
PREFILLED_SYRINGE | INTRAVENOUS | Status: AC
  Filled 2020-02-10: qty 10

## 2020-02-10 MED ORDER — FENTANYL CITRATE (PF) 100 MCG/2ML IJ SOLN
INTRAMUSCULAR | Status: DC | PRN
  Administered 2020-02-10: 50 ug via INTRAVENOUS
  Administered 2020-02-10: 100 ug via INTRAVENOUS
  Administered 2020-02-10: 50 ug via INTRAVENOUS

## 2020-02-10 MED ORDER — ROCURONIUM BROMIDE 100 MG/10ML IV SOLN
INTRAVENOUS | Status: DC | PRN
  Administered 2020-02-10: 30 mg via INTRAVENOUS
  Administered 2020-02-10: 70 mg via INTRAVENOUS

## 2020-02-10 MED ORDER — LEVOTHYROXINE SODIUM 100 MCG PO TABS
100.0000 ug | ORAL_TABLET | Freq: Every day | ORAL | Status: DC
  Administered 2020-02-11: 100 ug via ORAL
  Filled 2020-02-10: qty 1

## 2020-02-10 MED ORDER — TIMOLOL MALEATE 0.5 % OP SOLN
1.0000 [drp] | Freq: Every day | OPHTHALMIC | Status: DC
  Filled 2020-02-10: qty 5

## 2020-02-10 MED ORDER — LOSARTAN POTASSIUM 50 MG PO TABS
100.0000 mg | ORAL_TABLET | Freq: Every day | ORAL | Status: DC
  Administered 2020-02-10: 100 mg via ORAL
  Filled 2020-02-10: qty 2

## 2020-02-10 MED ORDER — PHENOL 1.4 % MT LIQD: 1.0000 | OROMUCOSAL | Status: DC | PRN

## 2020-02-10 MED ORDER — ACETAMINOPHEN 500 MG PO TABS: 1000.0000 mg | ORAL_TABLET | Freq: Once | ORAL | Status: DC | PRN

## 2020-02-10 MED ORDER — MORPHINE SULFATE (PF) 2 MG/ML IV SOLN: 2.0000 mg | INTRAVENOUS | Status: DC | PRN

## 2020-02-10 MED ORDER — LIDOCAINE-EPINEPHRINE 0.5 %-1:200000 IJ SOLN
INTRAMUSCULAR | Status: DC | PRN
  Administered 2020-02-10: 10 mL

## 2020-02-10 MED ORDER — THROMBIN 20000 UNITS EX SOLR
CUTANEOUS | Status: DC | PRN
  Administered 2020-02-10: 20 mL via TOPICAL

## 2020-02-10 MED ORDER — LIDOCAINE HCL (CARDIAC) PF 100 MG/5ML IV SOSY
PREFILLED_SYRINGE | INTRAVENOUS | Status: DC | PRN
  Administered 2020-02-10: 60 mg via INTRAVENOUS

## 2020-02-10 MED ORDER — POTASSIUM CHLORIDE IN NACL 20-0.9 MEQ/L-% IV SOLN
INTRAVENOUS | Status: DC
  Filled 2020-02-10: qty 1000

## 2020-02-10 MED ORDER — OXYCODONE HCL 5 MG/5ML PO SOLN: 5.0000 mg | Freq: Once | ORAL | Status: DC | PRN

## 2020-02-10 MED ORDER — CEFAZOLIN SODIUM-DEXTROSE 2-4 GM/100ML-% IV SOLN
INTRAVENOUS | Status: AC
  Filled 2020-02-10: qty 100

## 2020-02-10 MED ORDER — GABAPENTIN 600 MG PO TABS
600.0000 mg | ORAL_TABLET | Freq: Two times a day (BID) | ORAL | Status: DC
  Administered 2020-02-10 – 2020-02-11 (×2): 600 mg via ORAL
  Filled 2020-02-10 (×2): qty 1

## 2020-02-10 MED ORDER — VITAMIN B-12 1000 MCG PO TABS
1000.0000 ug | ORAL_TABLET | Freq: Every day | ORAL | Status: DC
  Administered 2020-02-11: 1000 ug via ORAL
  Filled 2020-02-10: qty 1

## 2020-02-10 MED ORDER — 0.9 % SODIUM CHLORIDE (POUR BTL) OPTIME
TOPICAL | Status: DC | PRN
  Administered 2020-02-10: 1000 mL

## 2020-02-10 MED ORDER — MENTHOL 3 MG MT LOZG: 1.0000 | LOZENGE | OROMUCOSAL | Status: DC | PRN

## 2020-02-10 MED ORDER — OXYCODONE HCL 5 MG PO TABS: 10.0000 mg | ORAL_TABLET | ORAL | Status: DC | PRN

## 2020-02-10 MED ORDER — ZOLPIDEM TARTRATE 5 MG PO TABS: 5.0000 mg | ORAL_TABLET | Freq: Every evening | ORAL | Status: DC | PRN

## 2020-02-10 MED ORDER — DEXAMETHASONE SODIUM PHOSPHATE 10 MG/ML IJ SOLN
INTRAMUSCULAR | Status: DC | PRN
  Administered 2020-02-10: 5 mg via INTRAVENOUS

## 2020-02-10 MED ORDER — GLIMEPIRIDE 2 MG PO TABS
1.0000 mg | ORAL_TABLET | Freq: Every day | ORAL | Status: DC
  Administered 2020-02-11: 1 mg via ORAL
  Filled 2020-02-10: qty 1

## 2020-02-10 MED ORDER — KETOROLAC TROMETHAMINE 15 MG/ML IJ SOLN
7.5000 mg | Freq: Four times a day (QID) | INTRAMUSCULAR | Status: DC
  Administered 2020-02-11 (×2): 7.5 mg via INTRAVENOUS
  Filled 2020-02-10 (×2): qty 1

## 2020-02-10 MED ORDER — THROMBIN 20000 UNITS EX SOLR
CUTANEOUS | Status: AC
  Filled 2020-02-10: qty 20000

## 2020-02-10 MED ORDER — SUGAMMADEX SODIUM 200 MG/2ML IV SOLN
INTRAVENOUS | Status: DC | PRN
  Administered 2020-02-10: 200 mg via INTRAVENOUS

## 2020-02-10 MED ORDER — CHLORHEXIDINE GLUCONATE 0.12 % MT SOLN
OROMUCOSAL | Status: AC
  Administered 2020-02-10: 15 mL via OROMUCOSAL
  Filled 2020-02-10: qty 15

## 2020-02-10 MED ORDER — ONDANSETRON HCL 4 MG/2ML IJ SOLN
INTRAMUSCULAR | Status: DC | PRN
  Administered 2020-02-10: 4 mg via INTRAVENOUS

## 2020-02-10 MED ORDER — ATORVASTATIN CALCIUM 10 MG PO TABS: 20.0000 mg | ORAL_TABLET | Freq: Every day | ORAL | Status: DC

## 2020-02-10 MED ORDER — ACETAMINOPHEN 10 MG/ML IV SOLN: 1000.0000 mg | Freq: Once | INTRAVENOUS | Status: DC | PRN

## 2020-02-10 MED ORDER — OXYCODONE HCL 5 MG PO TABS: 5.0000 mg | ORAL_TABLET | Freq: Once | ORAL | Status: DC | PRN

## 2020-02-10 MED ORDER — MIDAZOLAM HCL 5 MG/5ML IJ SOLN
INTRAMUSCULAR | Status: DC | PRN
  Administered 2020-02-10: 2 mg via INTRAVENOUS

## 2020-02-10 MED ORDER — DIAZEPAM 5 MG PO TABS: 5.0000 mg | ORAL_TABLET | Freq: Four times a day (QID) | ORAL | Status: DC | PRN

## 2020-02-10 MED ORDER — FENTANYL CITRATE (PF) 250 MCG/5ML IJ SOLN
INTRAMUSCULAR | Status: AC
  Filled 2020-02-10: qty 5

## 2020-02-10 MED ORDER — PROPOFOL 10 MG/ML IV BOLUS
INTRAVENOUS | Status: AC
  Filled 2020-02-10: qty 20

## 2020-02-10 MED ORDER — SODIUM CHLORIDE 0.9 % IV SOLN
250.0000 mL | INTRAVENOUS | Status: DC
  Administered 2020-02-10: 250 mL via INTRAVENOUS

## 2020-02-10 MED ORDER — LIDOCAINE 2% (20 MG/ML) 5 ML SYRINGE
INTRAMUSCULAR | Status: AC
  Filled 2020-02-10: qty 5

## 2020-02-10 MED ORDER — ACETAMINOPHEN 160 MG/5ML PO SOLN: 1000.0000 mg | Freq: Once | ORAL | Status: DC | PRN

## 2020-02-10 MED ORDER — LINAGLIPTIN 5 MG PO TABS
5.0000 mg | ORAL_TABLET | Freq: Every day | ORAL | Status: DC
  Administered 2020-02-11: 5 mg via ORAL
  Filled 2020-02-10: qty 1

## 2020-02-10 MED ORDER — SENNA 8.6 MG PO TABS
1.0000 | ORAL_TABLET | Freq: Two times a day (BID) | ORAL | Status: DC
  Administered 2020-02-10 – 2020-02-11 (×2): 8.6 mg via ORAL
  Filled 2020-02-10 (×2): qty 1

## 2020-02-10 MED ORDER — ONDANSETRON HCL 4 MG/2ML IJ SOLN: 4.0000 mg | Freq: Four times a day (QID) | INTRAMUSCULAR | Status: DC | PRN

## 2020-02-10 MED ORDER — OXYCODONE HCL 5 MG PO TABS: 5.0000 mg | ORAL_TABLET | ORAL | Status: DC | PRN

## 2020-02-10 MED ORDER — CEFAZOLIN SODIUM-DEXTROSE 2-4 GM/100ML-% IV SOLN
2.0000 g | INTRAVENOUS | Status: AC
  Administered 2020-02-10: 2 g via INTRAVENOUS

## 2020-02-10 MED ORDER — FENTANYL CITRATE (PF) 100 MCG/2ML IJ SOLN: 25.0000 ug | INTRAMUSCULAR | Status: DC | PRN

## 2020-02-10 MED ORDER — SENNOSIDES-DOCUSATE SODIUM 8.6-50 MG PO TABS: 1.0000 | ORAL_TABLET | Freq: Every evening | ORAL | Status: DC | PRN

## 2020-02-10 MED ORDER — TAMSULOSIN HCL 0.4 MG PO CAPS
0.4000 mg | ORAL_CAPSULE | Freq: Every morning | ORAL | Status: DC
  Administered 2020-02-11: 0.4 mg via ORAL
  Filled 2020-02-10: qty 1

## 2020-02-10 MED ORDER — OXYCODONE HCL ER 10 MG PO T12A
10.0000 mg | EXTENDED_RELEASE_TABLET | Freq: Two times a day (BID) | ORAL | Status: DC
  Administered 2020-02-10 – 2020-02-11 (×2): 10 mg via ORAL
  Filled 2020-02-10 (×2): qty 1

## 2020-02-10 MED ORDER — GLIMEPIRIDE 1 MG PO TABS: 0.5000 mg | ORAL_TABLET | ORAL | Status: DC

## 2020-02-10 MED ORDER — ACETAMINOPHEN 325 MG PO TABS: 650.0000 mg | ORAL_TABLET | ORAL | Status: DC | PRN

## 2020-02-10 MED ORDER — GLIMEPIRIDE 1 MG PO TABS
0.5000 mg | ORAL_TABLET | Freq: Every day | ORAL | Status: DC
  Filled 2020-02-10: qty 0.5

## 2020-02-10 MED ORDER — BUPIVACAINE HCL (PF) 0.5 % IJ SOLN
INTRAMUSCULAR | Status: DC | PRN
  Administered 2020-02-10: 20 mL

## 2020-02-10 MED ORDER — ACETAMINOPHEN 650 MG RE SUPP: 650.0000 mg | RECTAL | Status: DC | PRN

## 2020-02-10 MED ORDER — LORATADINE 10 MG PO TABS
10.0000 mg | ORAL_TABLET | Freq: Every day | ORAL | Status: DC
  Administered 2020-02-11: 10 mg via ORAL
  Filled 2020-02-10: qty 1

## 2020-02-10 SURGICAL SUPPLY — 64 items
BUR MATCHSTICK NEURO 3.0 LAGG (BURR) ×3 IMPLANT
BUR PRECISION FLUTE 5.0 (BURR) ×3 IMPLANT
CAGE POR CASCADIA 8.5X28X10 (Cage) ×3 IMPLANT
CANISTER SUCT 3000ML PPV (MISCELLANEOUS) ×3 IMPLANT
CAP RELINE MOD TULIP RMM (Cap) ×6 IMPLANT
CARTRIDGE OIL MAESTRO DRILL (MISCELLANEOUS) ×1 IMPLANT
CLOSURE WOUND 1/2 X4 (GAUZE/BANDAGES/DRESSINGS)
CNTNR URN SCR LID CUP LEK RST (MISCELLANEOUS) ×1 IMPLANT
CONT SPEC 4OZ STRL OR WHT (MISCELLANEOUS) ×2
COVER BACK TABLE 60X90IN (DRAPES) ×3 IMPLANT
DECANTER SPIKE VIAL GLASS SM (MISCELLANEOUS) ×3 IMPLANT
DERMABOND ADVANCED (GAUZE/BANDAGES/DRESSINGS) ×2
DERMABOND ADVANCED .7 DNX12 (GAUZE/BANDAGES/DRESSINGS) ×1 IMPLANT
DIFFUSER DRILL AIR PNEUMATIC (MISCELLANEOUS) ×3 IMPLANT
DRAPE C-ARM 42X72 X-RAY (DRAPES) ×6 IMPLANT
DRAPE C-ARMOR (DRAPES) ×3 IMPLANT
DRAPE LAPAROTOMY 100X72X124 (DRAPES) ×3 IMPLANT
DRAPE SURG 17X23 STRL (DRAPES) ×3 IMPLANT
DRSG OPSITE POSTOP 4X10 (GAUZE/BANDAGES/DRESSINGS) ×3 IMPLANT
DURAPREP 26ML APPLICATOR (WOUND CARE) ×3 IMPLANT
ELECT REM PT RETURN 9FT ADLT (ELECTROSURGICAL) ×3
ELECTRODE REM PT RTRN 9FT ADLT (ELECTROSURGICAL) ×1 IMPLANT
GAUZE 4X4 16PLY RFD (DISPOSABLE) IMPLANT
GAUZE SPONGE 4X4 12PLY STRL (GAUZE/BANDAGES/DRESSINGS) IMPLANT
GLOVE BIOGEL PI IND STRL 6.5 (GLOVE) ×1 IMPLANT
GLOVE BIOGEL PI IND STRL 8 (GLOVE) ×2 IMPLANT
GLOVE BIOGEL PI INDICATOR 6.5 (GLOVE) ×2
GLOVE BIOGEL PI INDICATOR 8 (GLOVE) ×4
GLOVE ECLIPSE 6.5 STRL STRAW (GLOVE) ×6 IMPLANT
GLOVE ECLIPSE 7.5 STRL STRAW (GLOVE) ×9 IMPLANT
GLOVE EXAM NITRILE XL STR (GLOVE) IMPLANT
GLOVE SURG SS PI 6.0 STRL IVOR (GLOVE) ×9 IMPLANT
GOWN STRL REUS W/ TWL LRG LVL3 (GOWN DISPOSABLE) ×3 IMPLANT
GOWN STRL REUS W/ TWL XL LVL3 (GOWN DISPOSABLE) IMPLANT
GOWN STRL REUS W/TWL 2XL LVL3 (GOWN DISPOSABLE) ×3 IMPLANT
GOWN STRL REUS W/TWL LRG LVL3 (GOWN DISPOSABLE) ×9
GOWN STRL REUS W/TWL XL LVL3 (GOWN DISPOSABLE)
KIT BASIN OR (CUSTOM PROCEDURE TRAY) ×3 IMPLANT
KIT POSITION SURG JACKSON T1 (MISCELLANEOUS) ×3 IMPLANT
KIT TURNOVER KIT B (KITS) ×3 IMPLANT
MILL MEDIUM DISP (BLADE) IMPLANT
NEEDLE HYPO 25X1 1.5 SAFETY (NEEDLE) ×3 IMPLANT
NEEDLE SPNL 18GX3.5 QUINCKE PK (NEEDLE) ×3 IMPLANT
NS IRRIG 1000ML POUR BTL (IV SOLUTION) ×3 IMPLANT
OIL CARTRIDGE MAESTRO DRILL (MISCELLANEOUS) ×3
PACK LAMINECTOMY NEURO (CUSTOM PROCEDURE TRAY) ×3 IMPLANT
PAD ARMBOARD 7.5X6 YLW CONV (MISCELLANEOUS) ×6 IMPLANT
PUTTY BONE ATTRAX 1CC CYLINDER (Putty) ×3 IMPLANT
ROD RELINE COCR 5.0X60MM (Rod) ×3 IMPLANT
ROD RELINE-O COCR 5.0X55MM (Rod) ×3 IMPLANT
SCREW LOCK RSS 4.5/5.0MM (Screw) ×18 IMPLANT
SCREW SHANK RELINE MOD 6.5X35 (Screw) ×6 IMPLANT
SPONGE LAP 4X18 RFD (DISPOSABLE) IMPLANT
SPONGE SURGIFOAM ABS GEL 100 (HEMOSTASIS) ×3 IMPLANT
STRIP CLOSURE SKIN 1/2X4 (GAUZE/BANDAGES/DRESSINGS) IMPLANT
SUT PROLENE 6 0 BV (SUTURE) IMPLANT
SUT VIC AB 0 CT1 18XCR BRD8 (SUTURE) ×1 IMPLANT
SUT VIC AB 0 CT1 8-18 (SUTURE) ×3
SUT VIC AB 2-0 CT1 18 (SUTURE) ×3 IMPLANT
SUT VIC AB 3-0 SH 8-18 (SUTURE) ×3 IMPLANT
TOWEL GREEN STERILE (TOWEL DISPOSABLE) ×3 IMPLANT
TOWEL GREEN STERILE FF (TOWEL DISPOSABLE) ×3 IMPLANT
TRAY FOLEY MTR SLVR 16FR STAT (SET/KITS/TRAYS/PACK) ×3 IMPLANT
WATER STERILE IRR 1000ML POUR (IV SOLUTION) ×3 IMPLANT

## 2020-02-10 NOTE — Anesthesia Procedure Notes (Signed)
Procedure Name: Intubation Date/Time: 02/10/2020 3:13 PM Performed by: Oriel Rumbold T, CRNA Pre-anesthesia Checklist: Patient identified, Emergency Drugs available, Suction available and Patient being monitored Patient Re-evaluated:Patient Re-evaluated prior to induction Oxygen Delivery Method: Circle system utilized Preoxygenation: Pre-oxygenation with 100% oxygen Induction Type: IV induction Ventilation: Mask ventilation without difficulty Laryngoscope Size: Miller and 3 Grade View: Grade I Tube type: Oral Tube size: 7.5 mm Number of attempts: 1 Airway Equipment and Method: Stylet and Oral airway Placement Confirmation: ETT inserted through vocal cords under direct vision,  positive ETCO2 and breath sounds checked- equal and bilateral Secured at: 22 cm Tube secured with: Tape Dental Injury: Teeth and Oropharynx as per pre-operative assessment

## 2020-02-10 NOTE — Anesthesia Preprocedure Evaluation (Signed)
Anesthesia Evaluation  Patient identified by MRN, date of birth, ID band Patient awake    Reviewed: Allergy & Precautions, NPO status , Patient's Chart, lab work & pertinent test results, reviewed documented beta blocker date and time   History of Anesthesia Complications Negative for: history of anesthetic complications  Airway Mallampati: II  TM Distance: >3 FB Neck ROM: Full    Dental  (+) Edentulous Upper, Edentulous Lower   Pulmonary neg recent URI, former smoker,    breath sounds clear to auscultation       Cardiovascular hypertension, Pt. on medications and Pt. on home beta blockers (-) angina(-) Past MI and (-) CHF  Rhythm:Regular     Neuro/Psych  Headaches,  Neuromuscular disease negative psych ROS   GI/Hepatic negative GI ROS, Neg liver ROS,   Endo/Other  diabetesHypothyroidism   Renal/GU negative Renal ROS     Musculoskeletal negative musculoskeletal ROS (+)   Abdominal   Peds  Hematology negative hematology ROS (+)   Anesthesia Other Findings   Reproductive/Obstetrics                             Anesthesia Physical Anesthesia Plan  ASA: II  Anesthesia Plan: General   Post-op Pain Management:    Induction: Intravenous  PONV Risk Score and Plan: 2 and Ondansetron and Dexamethasone  Airway Management Planned: Oral ETT  Additional Equipment: None  Intra-op Plan:   Post-operative Plan: Extubation in OR  Informed Consent: I have reviewed the patients History and Physical, chart, labs and discussed the procedure including the risks, benefits and alternatives for the proposed anesthesia with the patient or authorized representative who has indicated his/her understanding and acceptance.     Dental advisory given  Plan Discussed with: CRNA and Surgeon  Anesthesia Plan Comments:         Anesthesia Quick Evaluation

## 2020-02-10 NOTE — Op Note (Signed)
02/10/2020  7:23 PM  PATIENT:  Keith [name]  79 y.o. male  PRE-OPERATIVE DIAGNOSIS:  Disc displacement, Lumbar3/4  POST-OPERATIVE DIAGNOSIS:  Disc displacement, Lumbar3/4 left  PROCEDURE:  Procedure(s): Lumbar three-four Posterior lumbar interbody fusion, Stryker cages titanium packed with autograft morsels, and in the disc space Posterolateral arthrodesis L3/4 Segmental pedicle screw fixation L3-L5. Nuvasive Relign SURGEON:  Surgeon(s): Ashok Pall, MD Kristeen Miss, MD  ASSISTANTS:Elsner, Mallie Mussel  ANESTHESIA:   general  EBL:  Total I/O In: 60 [I.V.:900] Out: -   BLOOD ADMINISTERED:none  CELL SAVER GIVEN:none  COUNT:per nursing  DRAINS: none   SPECIMEN:  No Specimen  DICTATION: Keith [name] is a 79 y.o. male whom was taken to the operating room intubated, and placed under a general anesthetic without difficulty. A foley catheter was placed under sterile conditions. He was positioned prone on a Jackson stable with all pressure points properly padded.  His lumbar region was prepped and draped in a sterile manner. I infiltrated 10cc's 1/2%lidocaine/1:2000,000 strength epinephrine into the planned incision. I opened the skin with a 10 blade and took the incision down to the thoracolumbar fascia. I exposed the lamina of L3,  in a subperiosteal fashion bilaterally. I confirmed my location with an intraoperative xray.  I placed self retaining retractors and started the decompression.  I decompressed the spinal canal and removed the disc herniation at L3/4. I performed a semihemilaminectomy on the left . I exposed the disc space only on the left side. I opened the disc space with a 15 blade and removed a great deal of the disc and the entire free fragment on the left .   A PLIF was performed and approached at L3/4 on the left side only.in the same fashion. Iused a variety of instruments to remove the disc and prepare the space for the arthrodesis. I used curettes, rongeurs,  punches, shavers for the disc space, and rasps in the discetomy. I measured the disc space and placed one titanium cage(Stryker) packed with autograft into the disc space(s). I also packed bone around the cage.  I decorticated the lateral bone at L3/4. I then placed allograft morsels in the facet which was decorticated on the left side to complete the posterolateral arthrodesis.  We placed pedicle screws at L3, using fluoroscopic guidance. I drilled a pilot hole, then cannulated the pedicle with a drill at each site. We then tapped each pedicle, assessing each site for pedicle violations. No cutouts were appreciated. Screws (nuvasive relign) were then placed at each site without difficulty. I removed the locking caps from the screws at L4, and L5. I removed the rods.  I attached rods and locking caps with the appropriate tools from L3-L5 The locking caps were secured with torque limited screwdrivers. Final films were performed and the final construct appeared to be in good position.  I closed the wound in a layered fashion. I approximated the thoracolumbar fascia, subcutaneous, and subcuticular planes with vicryl sutures. I used dermabond and an occlusive bandage for a sterile dressing.     PLAN OF CARE: Admit to inpatient   PATIENT DISPOSITION:  PACU - hemodynamically stable.   Delay start of Pharmacological VTE agent (>24hrs) due to surgical blood loss or risk of bleeding:  yes

## 2020-02-10 NOTE — H&P (Signed)
BP (!) 147/75   Pulse 72   Temp 97.9 F (36.6 C) (Oral)   Resp 18   Ht 5\' 10"  (1.778 m)   Wt 87.6 kg   SpO2 97%   BMI 27.72 kg/m   Mr. Kulpa returns today with an MRI of the lumbar spine.  He had done very well after his fusion at 4-5, until he had severe and acute onset of pain in his left lower extremity.  I ordered an MRI to see what was going on.  What he has is a large herniated fragment that has migrated down from L3-4.  However, and most importantly, he is significantly better today than he was.  I went over all of the possibilities, and as I explained to him I think if he can deal with his improvement, thinking that he will continue to get better.  I believe it is better for him for me not to do this disc.  If I do, I will have to fuse him.  He is already spondylitic there.  He is already herniated there, and he is scoliotic there with a left-sided collapse in the coronal view.  This is not something that I believe I could do without an arthrodesis, and it just puts the level above, which is a right-sided collapse, at greater risk.    No Active Allergies Past Medical History:  Diagnosis Date  . BPH (benign prostatic hypertrophy)   . Full dentures   . Glaucoma, right eye   . Headache   . Hyperlipidemia   . Hypertension   . Hypothyroidism   . Local recurrence of cancer of urinary bladder [facility]) urologsit-  dr Diona Fanti   1st dx 01-29-2016--  now recurrent 05-31-2016  . Lower urinary tract symptoms (LUTS)   . Peripheral neuropathy   . Pneumonia   . Type 2 diabetes mellitus ([place])   . Wears glasses    Past Surgical History:  Procedure Laterality Date  . ANTERIOR CERVICAL DECOMP/DISCECTOMY FUSION  08-15-2000   C5 -- C6  . ANTERIOR CERVICAL DECOMP/DISCECTOMY FUSION  03-04-2008   C6 -- C7  . BACK SURGERY  2002 & 2009  . CHOLECYSTECTOMY    . CYSTOSCOPY WITH BIOPSY N/A 01/29/2016   Procedure: CYSTOSCOPY WITH BIOPSY;  Surgeon: Franchot Gallo, MD;  Location[facility];  Service: Urology;  Laterality: N/A;  . CYSTOSCOPY WITH BIOPSY N/A 05/31/2016   Procedure: CYSTOSCOPY WITH BIOPSY;  Surgeon: Franchot Gallo, MD;  Location: [facility];  Service: Urology;  Laterality: N/A;  . [place]  . GLAUCOMA SURGERY  1998 and 2000   Family History  Problem Relation Age of Onset  . Heart disease Mother   . Heart disease Father    Social History   Socioeconomic History  . Marital status: Married    Spouse name: Not on file  . Number of children: Not on file  . Years of education: Not on file  . Highest education level: Not on file  Occupational History  . Not on file  Tobacco Use  . Smoking status: Former Smoker    Years: 50.00    Types: Cigarettes    Quit date: 06/27/2004    Years since quitting: 15.6  . Smokeless tobacco: Never Used  Vaping Use  . Vaping Use: Never used  Substance and Sexual Activity  . Alcohol use: No    Alcohol/week: 0.0 standard drinks  . Drug use: No  .  Sexual activity: Not on file  Other Topics Concern  . Not on file  Social History Narrative  . Not on file   Social Determinants of Health   Financial Resource Strain:   . Difficulty of Paying Living Expenses:   Food Insecurity:   . Worried About Charity fundraiser in the Last Year:   . Arboriculturist in the Last Year:   Transportation Needs:   . Film/video editor (Medical):   Marland Kitchen Lack of Transportation (Non-Medical):   Physical Activity:   . Days of Exercise per Week:   . Minutes of Exercise per Session:   Stress:   . Feeling of Stress :   Social Connections:   . Frequency of Communication with Friends and Family:   . Frequency of Social Gatherings with Friends and Family:   . Attends Religious Services:   . Active Member of Clubs or Organizations:   . Attends Archivist Meetings:   Marland Kitchen Marital Status:   Intimate Partner Violence:   . Fear of Current or Ex-Partner:   . Emotionally  Abused:   Marland Kitchen Physically Abused:   . Sexually Abused:    Prior to Admission medications   Medication Sig Start Date End Date Taking? Authorizing Provider  amLODipine (NORVASC) 10 MG tablet Take 10 mg by mouth daily.    Yes [provider]  Aspirin-Salicylamide-Caffeine (ARTHRITIS STRENGTH BC POWDER PO) Take 1 packet by mouth daily as needed (pain).   Yes [provider]  atorvastatin (LIPITOR) 20 MG tablet Take 20 mg by mouth at bedtime.    Yes [provider]  carteolol (OCUPRESS) 1 % ophthalmic solution Place 1 drop into the right eye every morning.   Yes [provider]  cetirizine (ZYRTEC) 10 MG tablet Take 10 mg by mouth at bedtime.   Yes [provider]  fluticasone (FLONASE) 50 MCG/ACT nasal spray Place 2 sprays into both nostrils daily as needed for allergies.    Yes [provider]  gabapentin (NEURONTIN) 600 MG tablet Take 600 mg by mouth 2 (two) times daily.    Yes [provider]  glimepiride (AMARYL) 1 MG tablet Take 0.5-1 mg by mouth See admin instructions. 1 mg in the morning, 0.5 mg after lunch   Yes [provider]  levothyroxine (SYNTHROID, LEVOTHROID) 100 MCG tablet Take 100 mcg by mouth daily before breakfast.    Yes [provider]  losartan (COZAAR) 100 MG tablet Take 100 mg by mouth at bedtime.    Yes [provider]  meloxicam (MOBIC) 15 MG tablet Take 15 mg by mouth daily.   Yes [provider]  Misc Natural Products (COMPLETE PROSTATE HEALTH PO) Take 1 tablet by mouth 2 (two) times daily.   Yes [provider]  Oxycodone HCl 10 MG TABS Take 10 mg by mouth every 6 (six) hours as needed (pain).   Yes [provider]  sitaGLIPtin (JANUVIA) 100 MG tablet Take 100 mg by mouth every morning.   Yes [provider]  tamsulosin (FLOMAX) 0.4 MG CAPS capsule Take 0.4 mg by mouth every morning.    Yes [provider]  vitamin B-12 (CYANOCOBALAMIN)  1000 MCG tablet Take 1,000 mcg by mouth daily.   Yes [provider]  acetaminophen (TYLENOL) 500 MG tablet Take 1,000 mg by mouth every 8 (eight) hours as needed for moderate pain.    [provider]     PHYSICAL EXAMINATION :  He has full  strength in hip flexors, quadriceps, hamstrings, dorsiflexors and plantar flexors.  He is not experiencing any weakness.  His gait is otherwise normal today.  He is alert, oriented by 4.   He is admitted for lumbar decompression and arthrodesis.

## 2020-02-10 NOTE — Transfer of Care (Signed)
Immediate Anesthesia Transfer of Care Note  Patient: Keith [name]  Procedure(s) Performed: Lumbar three-four Posterior lumbar interbody fusion (N/A Spine Lumbar)  Patient Location: PACU  Anesthesia Type:General  Level of Consciousness: drowsy  Airway & Oxygen Therapy: Patient Spontanous Breathing and Patient connected to nasal cannula oxygen  Post-op Assessment: Report given to RN, Post -op Vital signs reviewed and stable and Patient moving all extremities  Post vital signs: Reviewed and stable  Last Vitals:  Vitals Value Taken Time  BP    Temp    Pulse    Resp    SpO2      Last Pain:  Vitals:   02/10/20 1042  TempSrc: Oral  PainSc: 4       Patients Stated Pain Goal: 3 (25/00/37 0488)  Complications: No complications documented.

## 2020-02-11 LAB — GLUCOSE, CAPILLARY
Glucose-Capillary: 138 mg/dL — ABNORMAL HIGH (ref 70–99)
Glucose-Capillary: 107 mg/dL — ABNORMAL HIGH (ref 70–99)

## 2020-02-11 MED ORDER — HYDROCODONE-ACETAMINOPHEN 5-325 MG PO TABS: 1.0000 | ORAL_TABLET | Freq: Four times a day (QID) | ORAL | 0 refills | Status: AC | PRN

## 2020-02-11 MED ORDER — TIZANIDINE HCL 4 MG PO TABS
4.0000 mg | ORAL_TABLET | Freq: Four times a day (QID) | ORAL | 0 refills | Status: DC | PRN
Start: 2020-02-11 — End: 2022-06-18

## 2020-02-11 NOTE — Discharge Instructions (Addendum)
Wound Care Remove outer dressing dressing Leave incision open to air. You may shower. Do not scrub directly on incision.  Do not put any creams, lotions, or ointments on incision. Activity Walk each and every day, increasing distance each day. No lifting greater than 5 lbs.  Avoid bending, arching, and twisting. No driving for 2 weeks; may ride as a passenger locally. If provided with back brace, wear when out of bed.  It is not necessary to wear in bed. Diet Resume your normal diet.  Return to Work Will be discussed at you follow up appointment. Call Your Doctor If Any of These Occur Redness, drainage, or swelling at the wound.  Temperature greater than 101 degrees. Severe pain not relieved by pain medication. Incision starts to come apart. Follow Up Appt Call today for appointment in 4 weeks ([phone]) or for problems.  If you have any hardware placed in your spine, you will need an x-ray before your appointment.        Spinal Fusion Care After Refer to this sheet in the next few weeks. These instructions provide you with information on caring for yourself after your procedure. Your caregiver may also give you more specific instructions. Your treatment has been planned according to current medical practices, but problems sometimes occur. Call your caregiver if you have any problems or questions after your procedure. HOME CARE INSTRUCTIONS   Take whatever pain medicine has been prescribed by your caregiver. Do not take over-the-counter pain medicine unless directed otherwise by your caregiver.   Do not drive if you are taking narcotic pain medicines.   Change your bandage (dressing) if necessary or as directed by your caregiver.   You may shower. The wound may get wet, simply pat the area dry. It will take ~2 weeks for the glue to peel off.  If you have been prescribed medicine to prevent your blood from clotting, follow the directions carefully.   Check the area around  your incision often. Look for redness and swelling. Also, look for anything leaking from your wound. You can use a mirror or have a family member inspect your incision if it is in a place where it is difficult for you to see.   Ask your caregiver what activities you should avoid and for how long.   Walk as much as possible.   Do not lift anything heavier than 5 lbs until your caregiver says it is safe.   Do not twist or bend for a few weeks. Try not to pull on things. Avoid sitting for long periods of time. Change positions at least every hour.

## 2020-02-11 NOTE — Plan of Care (Signed)
Patient alert and oriented mae's well, with no c/o pain or discomfort. Ready to be discharged home. All discharged instructions given to patient and spouse. Patient discharged home with all belongings. Patient has an appointment with Dr. Christella Noa in 3 weeks

## 2020-02-11 NOTE — Discharge Summary (Signed)
Physician Discharge Summary  Patient ID: Keith [name] MRN: [id number] DOB/AGE: 12-12-1940 79 y.o.  Admit date: 02/10/2020 Discharge date: 02/11/2020  Admission Diagnoses:HNP L3/4 Left  Discharge Diagnoses: same Active Problems:   HNP (herniated nucleus pulposus), lumbar   Discharged Condition: good  Hospital Course: Keith [name] was admitted and taken to the operating room for an uncomplicated lumbar plif at L3/4. I placed new screws and connected a new rod from L3-L5.Post op he is ambulating, voiding, and tolerating a regular diet. His wound is clean, dry, and without signs of infection.   Treatments: surgery: Lumbar three-four Posterior lumbar interbody fusion, Stryker cages titanium packed with autograft morsels, and in the disc space Posterolateral arthrodesis L3/4 Segmental pedicle screw fixation L3-L5. Nuvasive Relign  Discharge Exam: Blood pressure 122/77, pulse 77, temperature (!) 100.4 F (38 C), temperature source Oral, resp. rate 16, height 5\' 10"  (1.778 m), weight 87.6 kg, SpO2 95 %. General appearance: alert, cooperative, appears stated age and mild distress  Disposition: Discharge disposition: 01-Home or Self Care      Disc displacement, Lumbar  Allergies as of 02/11/2020   No Active Allergies     Medication List    STOP taking these medications   Oxycodone HCl 10 MG Tabs     TAKE these medications   acetaminophen 500 MG tablet Commonly known as: TYLENOL Take 1,000 mg by mouth every 8 (eight) hours as needed for moderate pain.   amLODipine 10 MG tablet Commonly known as: NORVASC Take 10 mg by mouth daily.   ARTHRITIS STRENGTH BC POWDER PO Take 1 packet by mouth daily as needed (pain).   atorvastatin 20 MG tablet Commonly known as: LIPITOR Take 20 mg by mouth at bedtime.   carteolol 1 % ophthalmic solution Commonly known as: OCUPRESS Place 1 drop into the right eye every morning.   cetirizine 10 MG tablet Commonly known as: ZYRTEC Take 10  mg by mouth at bedtime.   COMPLETE PROSTATE HEALTH PO Take 1 tablet by mouth 2 (two) times daily.   fluticasone 50 MCG/ACT nasal spray Commonly known as: FLONASE Place 2 sprays into both nostrils daily as needed for allergies.   gabapentin 600 MG tablet Commonly known as: NEURONTIN Take 600 mg by mouth 2 (two) times daily.   glimepiride 1 MG tablet Commonly known as: AMARYL Take 0.5-1 mg by mouth See admin instructions. 1 mg in the morning, 0.5 mg after lunch   HYDROcodone-acetaminophen 5-325 MG tablet Commonly known as: NORCO/VICODIN Take 1-2 tablets by mouth every 6 (six) hours as needed for up to 7 days for moderate pain.   levothyroxine 100 MCG tablet Commonly known as: SYNTHROID Take 100 mcg by mouth daily before breakfast.   losartan 100 MG tablet Commonly known as: COZAAR Take 100 mg by mouth at bedtime.   meloxicam 15 MG tablet Commonly known as: MOBIC Take 15 mg by mouth daily.   sitaGLIPtin 100 MG tablet Commonly known as: JANUVIA Take 100 mg by mouth every morning.   tamsulosin 0.4 MG Caps capsule Commonly known as: FLOMAX Take 0.4 mg by mouth every morning.   tiZANidine 4 MG tablet Commonly known as: ZANAFLEX Take 1 tablet (4 mg total) by mouth every 6 (six) hours as needed for muscle spasms.   vitamin B-12 1000 MCG tablet Commonly known as: CYANOCOBALAMIN Take 1,000 mcg by mouth daily.       Follow-up Information    Ashok Pall, MD Follow up in 3 week(s).   Specialty: Neurosurgery Why: please  call to make an appointment for 3 weeks Contact information: [address] [place] [address] [phone]               Signed: Ashok Pall 02/11/2020, 12:31 PM

## 2020-02-11 NOTE — Evaluation (Signed)
Physical Therapy Evaluation Patient Details Name: Keith [name] MRN: [id number] DOB: [birth year] Today's Date: 02/11/2020   History of Present Illness  Pt is a 79 y/o male s/p PLIF L3-4. PMH including but not limited to full dentures, glaucoma R eye, HLD, HTN, CA urinary bladder, periopheral neuropathy, PNA, DM2, wears glasses, ACDF x2, back surg 2002, 2009,      Clinical Impression  Pt presented supine in bed with HOB elevated, awake and willing to participate in therapy session. Prior to admission, pt reported that he was independent with all functional mobility and ADLs. Pt lives with his wife in a single level home with a ramped entrance. At the time of evaluation, pt overall moving very well without the need for physical assistance or an AD. Pt tolerated hallway ambulation without LOB or difficulties. PT provided pt education re: back precautions with handout provided, car transfers and a generalized walking program for pt to initiate upon d/c home. Pt expressed understanding. No further acute PT needs identified at this time. PT signing off.     Follow Up Recommendations No PT follow up    Equipment Recommendations  None recommended by PT    Recommendations for Other Services       Precautions / Restrictions Precautions Precautions: Back Precaution Booklet Issued: Yes (comment) Precaution Comments: reviewed all back precautions with pt throughout Restrictions Weight Bearing Restrictions: No      Mobility  Bed Mobility Overal bed mobility: Needs Assistance Bed Mobility: Rolling;Sidelying to Sit Rolling: Supervision Sidelying to sit: Supervision Supine to sit: Min guard Sit to supine: Min guard   General bed mobility comments: cueing needed for sequencing and technique  Transfers Overall transfer level: Needs assistance Equipment used: None Transfers: Sit to/from Stand Sit to Stand: Supervision         General transfer comment: no difficulties with transitions  from EOB  Ambulation/Gait Ambulation/Gait assistance: Min guard Gait Distance (Feet): 500 Feet Assistive device: 1 person hand held assist Gait Pattern/deviations: Step-through pattern;Decreased stride length Gait velocity: decreased   General Gait Details: pt steady overall with hallway ambulation, no instability or LOB; pt preferring 1HHA but did not need for balance or support  Stairs            Wheelchair Mobility    Modified Rankin (Stroke Patients Only)       Balance Overall balance assessment: Needs assistance Sitting-balance support: Feet supported Sitting balance-Leahy Scale: Good     Standing balance support: During functional activity;No upper extremity supported Standing balance-Leahy Scale: Fair                 High Level Balance Comments: pt reaching out for therapist with transfer and holding min (A). pt demonstrates ability to exit the bathroom with decr (A) from therapist but does walk the walls with hands             Pertinent Vitals/Pain Pain Assessment: Faces Faces Pain Scale: Hurts a little bit Pain Location: back Pain Descriptors / Indicators: Operative site guarding Pain Intervention(s): Monitored during session;Repositioned    Home Living Family/patient expects to be discharged to:: Private residence Living Arrangements: Spouse/significant other Available Help at Discharge: Family;Available 24 hours/day Type of Home: House Home Access: Ramped entrance     Home Layout: One level Home Equipment: Walker - 2 wheels;Wheelchair - Psychologist, educational      Prior Function Level of Independence: Independent         Comments: pt does not drive due to glaucoma  Hand Dominance   Dominant Hand: Right    Extremity/Trunk Assessment   Upper Extremity Assessment Upper Extremity Assessment: Defer to OT evaluation;Overall WFL for tasks assessed    Lower Extremity Assessment Lower Extremity Assessment: Overall WFL for  tasks assessed    Cervical / Trunk Assessment Cervical / Trunk Assessment: Other exceptions Cervical / Trunk Exceptions: s/p lumbar sx  Communication   Communication: No difficulties  Cognition Arousal/Alertness: Awake/alert Behavior During Therapy: WFL for tasks assessed/performed Overall Cognitive Status: Impaired/Different from baseline Area of Impairment: Safety/judgement                 Orientation Level: Disoriented to;Time (reports its 9:10PM) Current Attention Level: Sustained Memory: Decreased recall of precautions;Decreased short-term memory Following Commands: Follows multi-step commands inconsistently Safety/Judgement: Decreased awareness of deficits;Decreased awareness of safety Awareness: Intellectual Problem Solving: Slow processing General Comments: pt reports he has not slept at all and been up all night. pt reports "why are you coming at 9 at night" pt cued to look out window. pt reports "are you messing with me? its night time" Pt unable to recall that today is Friday July 2. pt states "wow. i am losing it then cause  are you sur eyou are not messing with me. The blue eyed ones are known to mess with you. I know i have one at home" Pt using humor to mask cognitive deficits this session. pt requires redirections to a direct answer to help determine cognitive changes this session.       General Comments      Exercises     Assessment/Plan    PT Assessment Patent does not need any further PT services  PT Problem List         PT Treatment Interventions      PT Goals (Current goals can be found in the Care Plan section)  Acute Rehab PT Goals Patient Stated Goal: to go home  PT Goal Formulation: All assessment and education complete, DC therapy    Frequency     Barriers to discharge        Co-evaluation               AM-PAC PT "6 Clicks" Mobility  Outcome Measure Help needed turning from your back to your side while in a flat bed without  using bedrails?: None Help needed moving from lying on your back to sitting on the side of a flat bed without using bedrails?: None Help needed moving to and from a bed to a chair (including a wheelchair)?: None Help needed standing up from a chair using your arms (e.g., wheelchair or bedside chair)?: None Help needed to walk in hospital room?: None Help needed climbing 3-5 steps with a railing? : A Little 6 Click Score: 23    End of Session   Activity Tolerance: Patient tolerated treatment well Patient left: in bed;with call bell/phone within reach Nurse Communication: Mobility status PT Visit Diagnosis: Other abnormalities of gait and mobility (R26.89)    Time: 3500-9381 PT Time Calculation (min) (ACUTE ONLY): 10 min   Charges:   PT Evaluation $PT Eval Low Complexity: 1 Low          Eduard Clos, PT, DPT  Acute Rehabilitation Services Pager [phone] Office [place] 02/11/2020, 11:22 AM

## 2020-02-11 NOTE — Evaluation (Signed)
Occupational Therapy Evaluation Patient Details Name: Keith [name] MRN: [id number] DOB: 05/09/1941 Today's Date: 02/11/2020    History of Present Illness 79 yo male s/p PLIF L3-4 PMH full dentures, glaucoma R eye, HLD, HTN, CA urinary bladder, periopheral neuropathy, PNA, DM2, wears glasses, ACDF x2, back surg 2002, 2009,    Clinical Impression   Pt is s/p PLIF L3-4 surgery resulting in the deficits listed below (see OT problem list). Patient will benefit from skilled OT to increase their independence and safety with mobility (while adhering to their precautions) to allow discharge home. Pt demonstrates cognitive deficits during session and reports its night time. Pt cued with window light that it is not 9pm at night and that the patient has slept per RN staff. Pt keeps asking the therapist throughout the session "are you messing with me?" when attempting to orient and make sure pt understands back precautions. Pt reports his wife is in that waiting room down there. Pt educated that if his wife were present she is allowed to visit him in his assigned hospital room. Pt states "well I am going to have a talk with someone about that then cause no one told me that and she has been down there all night"     Follow Up Recommendations  No OT follow up    Equipment Recommendations  None recommended by OT    Recommendations for Other Services       Precautions / Restrictions Precautions Precautions: Back Precaution Comments: handout provided and reviewed for adls.      Mobility Bed Mobility Overal bed mobility: Needs Assistance Bed Mobility: Supine to Sit;Rolling;Sit to Supine Rolling: Min guard   Supine to sit: Min guard Sit to supine: Min guard   General bed mobility comments: cues to sequence. pt attempts to long sit instead of log roll initially. pt states My pain was good until i tried to sit up.   Transfers Overall transfer level: Needs assistance   Transfers: Sit to/from  Stand Sit to Stand: Min guard         General transfer comment: pt able to static stand min guard but reaching for therapist once initiating transfer    Balance Overall balance assessment: Needs assistance Sitting-balance support: Bilateral upper extremity supported;Feet supported Sitting balance-Leahy Scale: Good     Standing balance support: Single extremity supported;During functional activity Standing balance-Leahy Scale: Fair                 High Level Balance Comments: pt reaching out for therapist with transfer and holding min (A). pt demonstrates ability to exit the bathroom with decr (A) from therapist but does walk the walls with hands           ADL either performed or assessed with clinical judgement   ADL Overall ADL's : Needs assistance/impaired Eating/Feeding: Supervision/ safety   Grooming: Supervision/safety   Upper Body Bathing: Supervision/ safety   Lower Body Bathing: Supervison/ safety   Upper Body Dressing : Supervision/safety   Lower Body Dressing: Supervision/safety Lower Body Dressing Details (indicate cue type and reason): able to demonstrate figure 4 cross Toilet Transfer: Copy Details (indicate cue type and reason): able to power up without hand use         Functional mobility during ADLs: Supervision/safety General ADL Comments: pt is moving well with mobility but is demonstrating cognitive deficit that increase fall risk. pt fixated on going home because its 9 pm at night.      Vision  Baseline Vision/History: Glaucoma       Perception     Praxis      Pertinent Vitals/Pain Pain Assessment: Faces Faces Pain Scale: Hurts a little bit Pain Location: back Pain Descriptors / Indicators: Operative site guarding Pain Intervention(s): Repositioned;Monitored during session     Hand Dominance Right   Extremity/Trunk Assessment Upper Extremity Assessment Upper Extremity Assessment: Overall WFL  for tasks assessed   Lower Extremity Assessment Lower Extremity Assessment: Defer to PT evaluation   Cervical / Trunk Assessment Cervical / Trunk Assessment: Other exceptions Cervical / Trunk Exceptions: s/p surg   Communication Communication Communication: No difficulties   Cognition Arousal/Alertness: Awake/alert Behavior During Therapy: Flat affect Overall Cognitive Status: Impaired/Different from baseline Area of Impairment: Orientation;Attention;Memory;Following commands;Safety/judgement;Awareness;Problem solving                 Orientation Level: Disoriented to;Time (reports its 9:10PM) Current Attention Level: Sustained Memory: Decreased recall of precautions;Decreased short-term memory Following Commands: Follows multi-step commands inconsistently Safety/Judgement: Decreased awareness of safety;Decreased awareness of deficits Awareness: Intellectual Problem Solving: Slow processing General Comments: pt reports he has not slept at all and been up all night. pt reports "why are you coming at 9 at night" pt cued to look out window. pt reports "are you messing with me? its night time" Pt unable to recall that today is Friday July 2. pt states "wow. i am losing it then cause  are you sur eyou are not messing with me. The blue eyed ones are known to mess with you. I know i have one at home" Pt using humor to mask cognitive deficits this session. pt requires redirections to a direct answer to help determine cognitive changes this session.    General Comments       Exercises     Shoulder Instructions      Home Living Family/patient expects to be discharged to:: Private residence Living Arrangements: Spouse/significant other Available Help at Discharge: Family;Available 24 hours/day Type of Home: House             Bathroom Shower/Tub: Teacher, early years/pre: Standard     Home Equipment: Environmental consultant - 2 wheels;Wheelchair - Psychologist, educational           Prior Functioning/Environment Level of Independence: Independent        Comments: pt does not drive due to glaucoma        OT Problem List: Decreased strength;Decreased activity tolerance;Impaired balance (sitting and/or standing);Decreased safety awareness;Decreased knowledge of use of DME or AE;Decreased knowledge of precautions;Pain;Decreased cognition;Impaired vision/perception      OT Treatment/Interventions: Self-care/ADL training;DME and/or AE instruction;Therapeutic activities;Cognitive remediation/compensation;Patient/family education;Balance training    OT Goals(Current goals can be found in the care plan section) Acute Rehab OT Goals Patient Stated Goal: to go home  OT Goal Formulation: Patient unable to participate in goal setting Time For Goal Achievement: 02/25/20 Potential to Achieve Goals: Good  OT Frequency: Min 2X/week   Barriers to D/C:            Co-evaluation              AM-PAC OT "6 Clicks" Daily Activity     Outcome Measure Help from another person eating meals?: None Help from another person taking care of personal grooming?: A Little Help from another person toileting, which includes using toliet, bedpan, or urinal?: A Little Help from another person bathing (including washing, rinsing, drying)?: A Little Help from another person to put on and taking off regular  upper body clothing?: A Little Help from another person to put on and taking off regular lower body clothing?: A Little 6 Click Score: 19   End of Session Nurse Communication: Mobility status;Precautions  Activity Tolerance: Patient tolerated treatment well Patient left: in bed;with call bell/phone within reach (turned on news for patient)  OT Visit Diagnosis: Unsteadiness on feet (R26.81);Muscle weakness (generalized) (M62.81)                Time: 6283-1517 OT Time Calculation (min): 14 min Charges:  OT General Charges $OT Visit: 1 Visit OT Evaluation $OT Eval Moderate  Complexity: 1 Mod   Brynn, OTR/L  Acute Rehabilitation Services Pager: [phone] Office: [phone] .   Keith [name] 02/11/2020, 10:04 AM

## 2020-02-12 ENCOUNTER — Emergency Department (HOSPITAL_COMMUNITY)
Admission: EM | Admit: 2020-02-12 | Discharge: 2020-02-13 | Disposition: A | Discharge status: Home / Self Care | Payer: Medicare HMO | Attending: Emergency Medicine | Admitting: Emergency Medicine

## 2020-02-12 ENCOUNTER — Other Ambulatory Visit: Payer: Self-pay

## 2020-02-12 ENCOUNTER — Emergency Department (HOSPITAL_COMMUNITY): Payer: Medicare HMO

## 2020-02-12 ENCOUNTER — Encounter (HOSPITAL_COMMUNITY): Payer: Self-pay | Admitting: Emergency Medicine

## 2020-02-12 DIAGNOSIS — R5082 Postprocedural fever: Secondary | ICD-10-CM | POA: Diagnosis not present

## 2020-02-12 DIAGNOSIS — M5489 Other dorsalgia: Secondary | ICD-10-CM | POA: Diagnosis not present

## 2020-02-12 DIAGNOSIS — M545 Low back pain: Secondary | ICD-10-CM | POA: Diagnosis not present

## 2020-02-12 DIAGNOSIS — E039 Hypothyroidism, unspecified: Secondary | ICD-10-CM | POA: Insufficient documentation

## 2020-02-12 DIAGNOSIS — R509 Fever, unspecified: Secondary | ICD-10-CM | POA: Diagnosis not present

## 2020-02-12 DIAGNOSIS — Z7984 Long term (current) use of oral hypoglycemic drugs: Secondary | ICD-10-CM | POA: Diagnosis not present

## 2020-02-12 DIAGNOSIS — Z87891 Personal history of nicotine dependence: Secondary | ICD-10-CM | POA: Insufficient documentation

## 2020-02-12 DIAGNOSIS — Z79899 Other long term (current) drug therapy: Secondary | ICD-10-CM | POA: Insufficient documentation

## 2020-02-12 DIAGNOSIS — R410 Disorientation, unspecified: Secondary | ICD-10-CM | POA: Diagnosis not present

## 2020-02-12 DIAGNOSIS — I10 Essential (primary) hypertension: Secondary | ICD-10-CM | POA: Insufficient documentation

## 2020-02-12 DIAGNOSIS — R0902 Hypoxemia: Secondary | ICD-10-CM | POA: Diagnosis not present

## 2020-02-12 DIAGNOSIS — Z981 Arthrodesis status: Secondary | ICD-10-CM | POA: Diagnosis not present

## 2020-02-12 DIAGNOSIS — E119 Type 2 diabetes mellitus without complications: Secondary | ICD-10-CM | POA: Insufficient documentation

## 2020-02-12 DIAGNOSIS — R52 Pain, unspecified: Secondary | ICD-10-CM | POA: Diagnosis not present

## 2020-02-12 DIAGNOSIS — Z20822 Contact with and (suspected) exposure to covid-19: Secondary | ICD-10-CM | POA: Insufficient documentation

## 2020-02-12 DIAGNOSIS — R4182 Altered mental status, unspecified: Secondary | ICD-10-CM | POA: Diagnosis present

## 2020-02-12 LAB — COMPREHENSIVE METABOLIC PANEL
ALT: 19 U/L (ref 0–44)
AST: 23 U/L (ref 15–41)
Albumin: 3.6 g/dL (ref 3.5–5.0)
Alkaline Phosphatase: 40 U/L (ref 38–126)
Anion gap: 10 (ref 5–15)
BUN: 21 mg/dL (ref 8–23)
CO2: 23 mmol/L (ref 22–32)
Calcium: 8.6 mg/dL — ABNORMAL LOW (ref 8.9–10.3)
Chloride: 102 mmol/L (ref 98–111)
Creatinine, Ser: 1.05 mg/dL (ref 0.61–1.24)
GFR calc Af Amer: >60 mL/min (ref >60)
GFR calc non Af Amer: >60 mL/min (ref >60)
Glucose, Bld: 89 mg/dL (ref 70–99)
Potassium: 3.6 mmol/L (ref 3.5–5.1)
Sodium: 135 mmol/L (ref 135–145)
Total Bilirubin: 0.9 mg/dL (ref 0.3–1.2)
Total Protein: 6.4 g/dL — ABNORMAL LOW (ref 6.5–8.1)

## 2020-02-12 LAB — CBC
HCT: 34.9 % — ABNORMAL LOW (ref 39.0–52.0)
Hemoglobin: 11.7 g/dL — ABNORMAL LOW (ref 13.0–17.0)
MCH: 30.6 pg (ref 26.0–34.0)
MCHC: 33.5 g/dL (ref 30.0–36.0)
MCV: 91.4 fL (ref 80.0–100.0)
Platelets: 147 10*3/uL — ABNORMAL LOW (ref 150–400)
RBC: 3.82 MIL/uL — ABNORMAL LOW (ref 4.22–5.81)
RDW: 12.2 % (ref 11.5–15.5)
WBC: 8 10*3/uL (ref 4.0–10.5)
nRBC: 0 % (ref 0.0–0.2)

## 2020-02-12 LAB — SEDIMENTATION RATE: Sed Rate: 28 mm/hr — ABNORMAL HIGH (ref 0–16)

## 2020-02-12 MED ORDER — ACETAMINOPHEN 325 MG PO TABS
650.0000 mg | ORAL_TABLET | Freq: Four times a day (QID) | ORAL | Status: DC | PRN
  Administered 2020-02-12: 650 mg via ORAL
  Filled 2020-02-12: qty 2

## 2020-02-12 NOTE — Discharge Instructions (Addendum)
You came into the emergency department due to a fever which occurred 2 days after your lower back surgery.  In the emergency department we checked initial labs which were not concerning.  We also reached out to neurosurgery who came and evaluated you and determined that a fever this soon after the surgery could be expected and recommended it would be safe to discharge you with close follow-up.  Please return or call your neurosurgeon if you develop worsening symptoms, ongoing or worsening fevers, or pus/drainage from your incision.

## 2020-02-12 NOTE — ED Provider Notes (Signed)
[name]MENT Provider Note   CSN: [id number] Arrival date & time: 02/12/20  1924     History Chief Complaint  Patient presents with  . Altered Mental Status  . Fever    Ke[facility] [name] is a 79 y.o. male.  Patient is a 79 year old male that presents today with a fever 2 days after having a lower back surgery.  Patient states that his spouse checks his forehead temperature multiple times per day and noted that he had a fever today prompting him to come to the emergency department.  He states he does not know how high the fever was at that time and had no symptoms. Patient states his surgery was performed on 1 July by Dr. Christella [name] which per chart review appears to be a posterior lumbar interbody fusion.  Patient states he has had a little bit of pain in his back since then but nothing more than he would expect after his surgery.  He denies weakness in any limbs, denies confusion, denies shortness of breath/chest pain/dysuria.  States he has had a little bit of a cough but this has been ongoing.       Past Medical History:  Diagnosis Date  . BPH (benign prostatic hypertrophy)   . Full dentures   . Glaucoma, right eye   . Headache   . Hyperlipidemia   . Hypertension   . Hypothyroidism   . 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 Lower urinary tract symptoms (LUTS)   . Peripheral neuropathy   . Pneumonia   . Type 2 diabetes mellitus ([name])   . Wears glasses     Patient Active Problem List   Diagnosis Date Noted  . HNP (herniated nucleus pulposus), lumbar 02/10/2020  . Spondylolisthesis of lumbar region 05/19/2019    Past Surgical History:  Procedure Laterality Date  . ANTERIOR CERVICAL DECOMP/DISCECTOMY FUSION  08-15-2000   C5 -- C6  . ANTERIOR CERVICAL DECOMP/DISCECTOMY FUSION  03-04-2008   C6 -- C7  . BACK SURGERY  2002 & 2009  . CHOLECYSTECTOMY     . CYSTOSCOPY WITH BIOPSY N/A 01/29/2016   Procedure: CYSTOSCOPY WITH BIOPSY;  Surgeon: Keith Gallo, MD;  Location: [facility];  Service: Urology;  Laterality: N/A;  . CYSTOSCOPY WITH BIOPSY N/A 05/31/2016   Procedure: CYSTOSCOPY WITH BIOPSY;  Surgeon: Keith Gallo, MD;  Location: [facility];  Service: Urology;  Laterality: N/A;  . [place]  . GLAUCOMA SURGERY  1998 and 2000       Family History  Problem Relation Age of Onset  . Heart disease Mother   . Heart disease Father     Social History   Tobacco Use  . Smoking status: Former Smoker    Years: 50.00    Types: Cigarettes    Quit date: 06/27/2004    Years since quitting: 15.6  . Smokeless tobacco: Never Used  Vaping Use  . Vaping Use: Never used  Substance Use Topics  . Alcohol use: No    Alcohol/week: 0.0 standard drinks  . Drug use: No    Home Medications Prior to Admission medications   Medication Sig Start Date End Date Taking? Authorizing Provider  acetaminophen (TYLENOL) 500 MG tablet Take 1,000 mg by mouth every 8 (eight) hours as needed for moderate pain.    [provider]  amLODipine (NORVASC) 10 MG tablet Take 10 mg by mouth daily.     [provider]  Aspirin-Salicylamide-Caffeine (ARTHRITIS STRENGTH BC POWDER PO) Take 1 packet by mouth daily as needed (pain).    [provider]  atorvastatin (LIPITOR) 20 MG tablet Take 20 mg by mouth at bedtime.     [provider]  carteolol (OCUPRESS) 1 % ophthalmic solution Place 1 drop into the right eye every morning.    [provider]  cetirizine (ZYRTEC) 10 MG tablet Take 10 mg by mouth at bedtime.    [provider]  fluticasone (FLONASE) 50 MCG/ACT nasal spray Place 2 sprays into both nostrils daily as needed for allergies.     [provider]  gabapentin (NEURONTIN) 600 MG tablet Take 600 mg by mouth 2 (two) times daily.      [provider]  glimepiride (AMARYL) 1 MG tablet Take 0.5-1 mg by mouth See admin instructions. 1 mg in the morning, 0.5 mg after lunch    [provider]  HYDROcodone-acetaminophen (NORCO/VICODIN) 5-325 MG tablet Take 1-2 tablets by mouth every 6 (six) hours as needed for up to 7 days for moderate pain. 02/11/20 02/18/20  Keith Pall, MD  levothyroxine (SYNTHROID, LEVOTHROID) 100 MCG tablet Take 100 mcg by mouth daily before breakfast.     [provider]  losartan (COZAAR) 100 MG tablet Take 100 mg by mouth at bedtime.     [provider]  meloxicam (MOBIC) 15 MG tablet Take 15 mg by mouth daily.    [provider]  Misc Natural Products (COMPLETE PROSTATE HEALTH PO) Take 1 tablet by mouth 2 (two) times daily.    [provider]  sitaGLIPtin (JANUVIA) 100 MG tablet Take 100 mg by mouth every morning.    [provider]  tamsulosin (FLOMAX) 0.4 MG CAPS capsule Take 0.4 mg by mouth every morning.     [provider]  tiZANidine (ZANAFLEX) 4 MG tablet Take 1 tablet (4 mg total) by mouth every 6 (six) hours as needed for muscle spasms. 02/11/20   Keith Pall, MD  vitamin B-12 (CYANOCOBALAMIN) 1000 MCG tablet Take 1,000 mcg by mouth daily.    [provider]    Allergies    Patient has no active allergies.  Review of Systems   Review of Systems  Constitutional: Positive for fever.  HENT: Negative for rhinorrhea and sore throat.   Respiratory: Positive for cough. Negative for shortness of breath.   Gastrointestinal: Negative for abdominal pain.  Genitourinary: Negative for dysuria.  Musculoskeletal: Positive for back pain. Negative for neck pain and neck stiffness.  Neurological: Negative for dizziness and numbness.    Physical Exam Updated Vital Signs BP 122/62   Pulse 75   Temp 98.4 F (36.9 C) (Oral)   Resp 20   Ht _0  (1.778 m)   Wt 87.1 kg   SpO2 97%   BMI 27.55 kg/m   Physical  Exam Constitutional:      Appearance: Normal appearance.  HENT:     Head: Normocephalic and atraumatic.     Mouth/Throat:     Mouth: Mucous membranes are moist.  Eyes:     Extraocular Movements: Extraocular movements intact.     Comments: Left pupil normal, right pupil with irregular shape (previous eye surgery). Both reactive to light  Cardiovascular:     Rate and Rhythm: Normal rate and regular rhythm.     Heart sounds: Normal heart sounds. No murmur heard.  Pulmonary:     Effort: Pulmonary effort is normal.     Breath sounds: Normal breath sounds.  Abdominal:     General: Abdomen is flat. Bowel sounds are normal.     Palpations: Abdomen is soft.  Musculoskeletal:        General: No swelling.     Cervical back: Normal range of motion and neck supple.  Skin:    Comments: Midline surgical incision present over lumbar spine with surgical bandage in place and mild erythema at the surgical border.  No drainage is visible.  Neurological:     General: No focal deficit present.     Mental Status: He is alert and oriented to person, place, and time.     Cranial Nerves: No cranial nerve deficit.     ED Results / Procedures / Treatments   Labs (all labs ordered are listed, but only abnormal results are displayed) Labs Reviewed  CBC - Abnormal; Notable for the following components:      Result Value   RBC 3.82 (*)    Hemoglobin 11.7 (*)    HCT 34.9 (*)    Platelets 147 (*)    All other components within normal limits  COMPREHENSIVE METABOLIC PANEL - Abnormal; Notable for the following components:   Calcium 8.6 (*)    Total Protein 6.4 (*)    All other components within normal limits  SEDIMENTATION RATE - Abnormal; Notable for the following components:   Sed Rate 28 (*)    All other components within normal limits  URINALYSIS, ROUTINE W REFLEX MICROSCOPIC - Abnormal; Notable for the following components:   Glucose, UA 50 (*)    All other components within normal limits   CULTURE, BLOOD (ROUTINE X 2)  CULTURE, BLOOD (ROUTINE X 2)  URINE CULTURE  SARS CORONAVIRUS 2 BY RT PCR (HOSPITAL ORDER, [place] [facility])    EKG None  Radiology DG Chest 2 View  Result Date: 02/12/2020 CLINICAL DATA:  Patient status post lumbar surgery 02/10/2020. New onset fever and chills. EXAM: CHEST - 2 VIEW COMPARISON:  PA and lateral chest 07/16/2016. FINDINGS: The patient is mildly rotated to the right. Lungs appear clear. Heart size normal. No pneumothorax or pleural effusion. Aortic atherosclerosis. No acute or focal bony abnormality. IMPRESSION: No acute disease. Electronically Signed   By: Inge Rise M.D.   On: 02/12/2020 21:21    Procedures Procedures (including critical care time)  Medications Ordered in ED Medications  acetaminophen (TYLENOL) tablet 650 mg (650 mg Oral Given 02/12/20 2137)    ED Course  I have reviewed the triage vital signs and the nursing notes.  Pertinent labs & imaging results that were available during my care of the patient were reviewed by me and considered in my medical decision making (see chart for details).  In the emergency department patient noted to have a fever with T-max of 101.1 Fahrenheit.  In the emergency department patient had urine cultures, blood cultures drawn as well as CBC, CMP, ESR, CRP.  Chest x-ray ordered in emergency department showed no active disease.  Neurosurgery was consulted, saw patient, and determined the patient would appropriate to discharge if labs showed nothing concerning.  CBC with normal white blood cell count 8.0.  Sed rate mildly elevated at 28, not unexpected post surgery. Urinalysis clear and negative for signs of obvious infection with negative leuk esterase, negative nitrite. Patient was discharged home with return precautions and plan for close neurosurgery follow up.  MDM Rules/Calculators/A&P                          Patient presented with fever elevated 101.71F in the  emergency department 2 days after lumbar fusion surgery.  Patient denies other complaints and states his pain is well controlled at this time.  Physical exam significant for midline surgical incision with slight erythema at the borders without any obvious drainage.  Fever likely reactive with normal white blood cell count and no other concerning symptoms at this time.  Per neurosurgery discussion the patient's fever is within expected norms at this time and patient can be discharged with close follow-up with neurosurgery.  Final Clinical Impression(s) / ED Diagnoses Final diagnoses:  Post-procedural fever    Rx / DC Orders ED Discharge Orders    None       Lurline Del, DO 02/13/20 0010    Elnora Morrison, MD 02/14/20 [phone]

## 2020-02-12 NOTE — ED Triage Notes (Signed)
BIB [facility] after c/o fever and chills 2 day post operatively after back surgery. Wife reported to EMS that pt has had some AMS as well. Upon arrival, pt is A X O to name and place, temp orally 101.1. B/P 138/68. Pt has hx of HTN.

## 2020-02-12 NOTE — Consult Note (Signed)
Neurosurgery Consultation Note  CC: Fever  HPI: This is a 79 y.o. man that presents POD2 from L3-5 PLIF with fevers, chronic cough that is not new post-op, no other new symptoms. He had some chills this morning, but they have resolved. He currently has no systemic complaints, +flatus and urinating without difficulty or dysuria. Preop radicular symptoms still resolved, back sore but improving since immediately post-op. No new weakness, numbness, or parasthesias, no recent change in bowel or bladder function.   ROS: A 14 point ROS was performed and is negative except as noted in the HPI.   PMHx:  Past Medical History:  Diagnosis Date  . BPH (benign prostatic hypertrophy)   . Full dentures   . Glaucoma, right eye   . Headache   . Hyperlipidemia   . Hypertension   . Hypothyroidism   . 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 Lower urinary tract symptoms (LUTS)   . Peripheral neuropathy   . Pneumonia   . Type 2 diabetes mellitus ([place])   . Wears glasses    FamHx:  Family History  Problem Relation Age of Onset  . Heart disease Mother   . Heart disease Father    SocHx:  reports that he quit smoking about 15 years ago. His smoking use included cigarettes. He quit after 50.00 years of use. He has never used smokeless tobacco. He reports that he does not drink alcohol and does not use drugs.  Exam: Vital signs in last 24 hours: Temp:  [101.1 F (38.4 C)] 101.1 F (38.4 C) (07/03 1933) Pulse Rate:  [85] 85 (07/03 1933) Resp:  [21] 21 (07/03 1933) BP: (138)/(68) 138/68 (07/03 1933) SpO2:  [94 %-99 %] 94 % (07/03 1933) Weight:  [87.1 kg] 87.1 kg (07/03 1934) General: Awake, alert, cooperative, lying in bed in NAD, appears to be in good spirits Head: Normocephalic and atruamatic HEENT: Neck supple Pulmonary: breathing room air comfortably, no evidence of increased work of breathing Cardiac:  RRR Abdomen: S NT ND, obese, mildly distended and tympanic but pt states it's normal abdominal girth for him Extremities: Warm and well perfused x4 Neuro: AOx3, PERRL, EOMI, FS Strength 5/5 x4, SILTx4  Assessment and Plan: 79 y.o. man s/p PLIF, now POD2 with fevers, no other symptoms.  -likely post-op inflammatory fever, no other concerning features, pt currently feels well, okay with discharge from ED from my perspective  Judith Part, MD 02/12/20 9:35 PM [facility]

## 2020-02-13 LAB — URINALYSIS, ROUTINE W REFLEX MICROSCOPIC
Bilirubin Urine: NEGATIVE
Glucose, UA: 50 mg/dL — AB
Hgb urine dipstick: NEGATIVE
Ketones, ur: NEGATIVE mg/dL
Leukocytes,Ua: NEGATIVE
Nitrite: NEGATIVE
Protein, ur: NEGATIVE mg/dL
Specific Gravity, Urine: 1.014 (ref 1.005–1.030)
pH: 5 (ref 5.0–8.0)

## 2020-02-13 LAB — SARS CORONAVIRUS 2 BY RT PCR (HOSPITAL ORDER, PERFORMED IN ~~LOC~~ HOSPITAL LAB): SARS Coronavirus 2: NEGATIVE

## 2020-02-13 NOTE — ED Notes (Signed)
Patient verbalizes understanding of discharge instructions. Opportunity for questioning and answers were provided. Armband removed by staff, pt discharged from ED. Pt. ambulatory and discharged home.  

## 2020-02-14 LAB — URINE CULTURE: Culture: NO GROWTH

## 2020-02-15 ENCOUNTER — Other Ambulatory Visit: Payer: Self-pay | Admitting: *Deleted

## 2020-02-15 NOTE — Patient Outreach (Signed)
[facility]) Care Management  02/15/2020  [name] 12/28/1940 [id number]   General discharge Red Flag Alert   Day: #1 Date: 02/13/20 Red Alert Reason : Know who to call about changes in condition? No  Scheduled follow-up? No  Taking meds? No    Outreach Attempt #1 Subjective :  Successful outreach call to patient , explained reason for the call. Reviewed EMMI automated post discharge call, discussed questions of Who to call about changes in condition, scheduled follow up, and taking medication, patient answered NO to questions. He states that that he must had made a mistake , he understands to call Dr. Cyndy Freeze with questions related to surgery, He has follow up appointment for July 22, he reports taking his medication as prescribed.  Patient states that he is feeling fairly decent , he discussed recent ED visit/due to fever. He denies having fever, states that his wife has observed incision and it looks okay. He reports tolerating activity in home, appetite is good. .  Patient denies any other concerns at this time.    Plan Will close case to [facility] care management .    Joylene Draft, RN, BSN  [facility] Management Coordinator  [phone]- Mobile [phone]- Toll Free Main Office

## 2020-02-17 LAB — CULTURE, BLOOD (ROUTINE X 2)
Culture: NO GROWTH
Culture: NO GROWTH

## 2020-02-17 NOTE — Anesthesia Postprocedure Evaluation (Signed)
Anesthesia Post Note  Patient: [name]  Procedure(s) Performed: Lumbar three-four Posterior lumbar interbody fusion (N/A Spine Lumbar)     Patient location during evaluation: PACU Anesthesia Type: General Level of consciousness: awake and alert Pain management: pain level controlled Vital Signs Assessment: post-procedure vital signs reviewed and stable Respiratory status: spontaneous breathing, nonlabored ventilation, respiratory function stable and patient connected to nasal cannula oxygen Cardiovascular status: blood pressure returned to baseline and stable Postop Assessment: no apparent nausea or vomiting Anesthetic complications: no   No complications documented.  Last Vitals:  Vitals:   02/11/20 0744 02/11/20 1154  BP: (!) 129/56 122/77  Pulse: 75 77  Resp: 17 16  Temp: 37.1 C (!) 38 C  SpO2: 96% 95%    Last Pain:  Vitals:   02/11/20 1154  TempSrc: Oral  PainSc:                  Smt. Loder

## 2020-03-02 DIAGNOSIS — M5126 Other intervertebral disc displacement, lumbar region: Secondary | ICD-10-CM | POA: Diagnosis not present

## 2020-03-02 DIAGNOSIS — M4726 Other spondylosis with radiculopathy, lumbar region: Secondary | ICD-10-CM | POA: Diagnosis not present

## 2020-03-10 DIAGNOSIS — Z8551 Personal history of malignant neoplasm of bladder: Secondary | ICD-10-CM | POA: Diagnosis not present

## 2020-04-03 DIAGNOSIS — Z8551 Personal history of malignant neoplasm of bladder: Secondary | ICD-10-CM | POA: Diagnosis not present

## 2020-04-03 DIAGNOSIS — N401 Enlarged prostate with lower urinary tract symptoms: Secondary | ICD-10-CM | POA: Diagnosis not present

## 2020-04-03 DIAGNOSIS — G894 Chronic pain syndrome: Secondary | ICD-10-CM | POA: Diagnosis not present

## 2020-04-03 DIAGNOSIS — E039 Hypothyroidism, unspecified: Secondary | ICD-10-CM | POA: Diagnosis not present

## 2020-04-03 DIAGNOSIS — E785 Hyperlipidemia, unspecified: Secondary | ICD-10-CM | POA: Diagnosis not present

## 2020-04-03 DIAGNOSIS — I10 Essential (primary) hypertension: Secondary | ICD-10-CM | POA: Diagnosis not present

## 2020-04-03 DIAGNOSIS — E1169 Type 2 diabetes mellitus with other specified complication: Secondary | ICD-10-CM | POA: Diagnosis not present

## 2020-04-03 DIAGNOSIS — R809 Proteinuria, unspecified: Secondary | ICD-10-CM | POA: Diagnosis not present

## 2020-04-26 DIAGNOSIS — Z85828 Personal history of other malignant neoplasm of skin: Secondary | ICD-10-CM | POA: Diagnosis not present

## 2020-04-26 DIAGNOSIS — D225 Melanocytic nevi of trunk: Secondary | ICD-10-CM | POA: Diagnosis not present

## 2020-04-26 DIAGNOSIS — L821 Other seborrheic keratosis: Secondary | ICD-10-CM | POA: Diagnosis not present

## 2020-04-26 DIAGNOSIS — Z08 Encounter for follow-up examination after completed treatment for malignant neoplasm: Secondary | ICD-10-CM | POA: Diagnosis not present

## 2020-05-04 ENCOUNTER — Other Ambulatory Visit: Payer: Self-pay | Admitting: Urology

## 2020-05-08 ENCOUNTER — Other Ambulatory Visit (HOSPITAL_COMMUNITY)
Admission: RE | Admit: 2020-05-08 | Discharge: 2020-05-08 | Disposition: A | Discharge status: Home / Self Care | Payer: Medicare HMO | Source: Ambulatory Visit | Attending: Urology | Admitting: Urology

## 2020-05-08 ENCOUNTER — Other Ambulatory Visit: Payer: Self-pay

## 2020-05-08 ENCOUNTER — Encounter (HOSPITAL_BASED_OUTPATIENT_CLINIC_OR_DEPARTMENT_OTHER): Payer: Self-pay | Admitting: Urology

## 2020-05-08 DIAGNOSIS — Z20822 Contact with and (suspected) exposure to covid-19: Secondary | ICD-10-CM | POA: Diagnosis not present

## 2020-05-08 DIAGNOSIS — Z01812 Encounter for preprocedural laboratory examination: Secondary | ICD-10-CM | POA: Insufficient documentation

## 2020-05-08 LAB — SARS CORONAVIRUS 2 (TAT 6-24 HRS): SARS Coronavirus 2: NEGATIVE

## 2020-05-08 NOTE — Progress Notes (Signed)
Spoke w/ via phone for pre-op interview---pt wife [name] needs dos----   I stat 8            [name] results------ekg and chest xary done 02-12-2020 epic COVID test ------05-08-20 Arrive at -------1200 pm 05-11-20 NPO after MN NO Solid Food.  Clear liquids from MN until---1100 am then npo Medications to take morning of surgery -----gabapentin, tamsulosin, synthoid, eye drop Diabetic medication -----none day of surgery Patient Special Instructions -----none Pre-Op special Istructions -----none Patient verbalized understanding of instructions that were given at this phone interview. Patient denies shortness of breath, chest pain, fever, cough at this phone interview.

## 2020-05-11 ENCOUNTER — Ambulatory Visit (HOSPITAL_BASED_OUTPATIENT_CLINIC_OR_DEPARTMENT_OTHER): Payer: Medicare HMO | Admitting: Anesthesiology

## 2020-05-11 ENCOUNTER — Encounter (HOSPITAL_BASED_OUTPATIENT_CLINIC_OR_DEPARTMENT_OTHER): Payer: Self-pay | Admitting: Urology

## 2020-05-11 ENCOUNTER — Other Ambulatory Visit: Payer: Self-pay

## 2020-05-11 ENCOUNTER — Encounter (HOSPITAL_BASED_OUTPATIENT_CLINIC_OR_DEPARTMENT_OTHER)
Admission: RE | Disposition: A | Discharge status: Home / Self Care | Payer: Self-pay | Source: Home / Self Care | Attending: Urology

## 2020-05-11 ENCOUNTER — Ambulatory Visit (HOSPITAL_BASED_OUTPATIENT_CLINIC_OR_DEPARTMENT_OTHER)
Admission: RE | Admit: 2020-05-11 | Discharge: 2020-05-11 | Disposition: A | Discharge status: Home / Self Care | Payer: Medicare HMO | Attending: Urology | Admitting: Urology

## 2020-05-11 DIAGNOSIS — N2889 Other specified disorders of kidney and ureter: Secondary | ICD-10-CM | POA: Diagnosis not present

## 2020-05-11 DIAGNOSIS — N3289 Other specified disorders of bladder: Secondary | ICD-10-CM | POA: Diagnosis present

## 2020-05-11 DIAGNOSIS — Z8651 Personal history of combat and operational stress reaction: Secondary | ICD-10-CM | POA: Diagnosis not present

## 2020-05-11 DIAGNOSIS — E1142 Type 2 diabetes mellitus with diabetic polyneuropathy: Secondary | ICD-10-CM | POA: Diagnosis not present

## 2020-05-11 DIAGNOSIS — C678 Malignant neoplasm of overlapping sites of bladder: Secondary | ICD-10-CM

## 2020-05-11 DIAGNOSIS — D494 Neoplasm of unspecified behavior of bladder: Secondary | ICD-10-CM | POA: Diagnosis not present

## 2020-05-11 DIAGNOSIS — Z8551 Personal history of malignant neoplasm of bladder: Secondary | ICD-10-CM | POA: Insufficient documentation

## 2020-05-11 DIAGNOSIS — Z87891 Personal history of nicotine dependence: Secondary | ICD-10-CM | POA: Insufficient documentation

## 2020-05-11 DIAGNOSIS — C676 Malignant neoplasm of ureteric orifice: Secondary | ICD-10-CM | POA: Insufficient documentation

## 2020-05-11 DIAGNOSIS — C679 Malignant neoplasm of bladder, unspecified: Secondary | ICD-10-CM | POA: Diagnosis not present

## 2020-05-11 DIAGNOSIS — I10 Essential (primary) hypertension: Secondary | ICD-10-CM | POA: Diagnosis not present

## 2020-05-11 DIAGNOSIS — N1330 Unspecified hydronephrosis: Secondary | ICD-10-CM | POA: Diagnosis not present

## 2020-05-11 HISTORY — DX: Gastro-esophageal reflux disease without esophagitis: K21.9

## 2020-05-11 HISTORY — DX: Unspecified glaucoma: H40.9

## 2020-05-11 HISTORY — DX: Migraine, unspecified, not intractable, without status migrainosus: G43.909

## 2020-05-11 HISTORY — DX: Malignant neoplasm of bladder, unspecified: C67.9

## 2020-05-11 HISTORY — DX: Unspecified cataract: H26.9

## 2020-05-11 HISTORY — PX: CYSTOSCOPY W/ RETROGRADES: SHX1426

## 2020-05-11 HISTORY — DX: Type 2 diabetes mellitus without complications: E11.9

## 2020-05-11 LAB — BASIC METABOLIC PANEL
Anion gap: 11 (ref 5–15)
BUN: 14 mg/dL (ref 8–23)
CO2: 23 mmol/L (ref 22–32)
Calcium: 9.5 mg/dL (ref 8.9–10.3)
Chloride: 106 mmol/L (ref 98–111)
Creatinine, Ser: 0.82 mg/dL (ref 0.61–1.24)
GFR calc Af Amer: >60 mL/min (ref >60)
GFR calc non Af Amer: >60 mL/min (ref >60)
Glucose, Bld: 104 mg/dL — ABNORMAL HIGH (ref 70–99)
Potassium: 4.4 mmol/L (ref 3.5–5.1)
Sodium: 140 mmol/L (ref 135–145)

## 2020-05-11 LAB — GLUCOSE, CAPILLARY
Glucose-Capillary: 95 mg/dL (ref 70–99)
Glucose-Capillary: 115 mg/dL — ABNORMAL HIGH (ref 70–99)

## 2020-05-11 SURGERY — CYSTOSCOPY, WITH RETROGRADE PYELOGRAM
Anesthesia: General | Site: Ureter

## 2020-05-11 MED ORDER — CEPHALEXIN 500 MG PO CAPS: 500.0000 mg | ORAL_CAPSULE | Freq: Two times a day (BID) | ORAL | 0 refills | Status: AC

## 2020-05-11 MED ORDER — ONDANSETRON HCL 4 MG/2ML IJ SOLN
INTRAMUSCULAR | Status: DC | PRN
  Administered 2020-05-11: 4 mg via INTRAVENOUS

## 2020-05-11 MED ORDER — LIDOCAINE HCL (CARDIAC) PF 100 MG/5ML IV SOSY
PREFILLED_SYRINGE | INTRAVENOUS | Status: DC | PRN
  Administered 2020-05-11: 60 mg via INTRAVENOUS

## 2020-05-11 MED ORDER — ONDANSETRON HCL 4 MG/2ML IJ SOLN: 4.0000 mg | Freq: Once | INTRAMUSCULAR | Status: DC | PRN

## 2020-05-11 MED ORDER — ONDANSETRON HCL 4 MG/2ML IJ SOLN
INTRAMUSCULAR | Status: AC
  Filled 2020-05-11: qty 2

## 2020-05-11 MED ORDER — EPHEDRINE 5 MG/ML INJ
INTRAVENOUS | Status: AC
  Filled 2020-05-11: qty 10

## 2020-05-11 MED ORDER — LACTATED RINGERS IV SOLN: INTRAVENOUS | Status: DC

## 2020-05-11 MED ORDER — PROPOFOL 10 MG/ML IV BOLUS
INTRAVENOUS | Status: AC
  Filled 2020-05-11: qty 20

## 2020-05-11 MED ORDER — CEFAZOLIN SODIUM-DEXTROSE 2-4 GM/100ML-% IV SOLN
2.0000 g | INTRAVENOUS | Status: AC
  Administered 2020-05-11: 2 g via INTRAVENOUS

## 2020-05-11 MED ORDER — SODIUM CHLORIDE 0.9 % IR SOLN
Status: DC | PRN
  Administered 2020-05-11: 2500 mL via INTRAVESICAL

## 2020-05-11 MED ORDER — DEXAMETHASONE SODIUM PHOSPHATE 10 MG/ML IJ SOLN
INTRAMUSCULAR | Status: AC
  Filled 2020-05-11: qty 1

## 2020-05-11 MED ORDER — FENTANYL CITRATE (PF) 100 MCG/2ML IJ SOLN: 25.0000 ug | INTRAMUSCULAR | Status: DC | PRN

## 2020-05-11 MED ORDER — OXYCODONE HCL 5 MG PO TABS: 5.0000 mg | ORAL_TABLET | Freq: Once | ORAL | Status: DC | PRN

## 2020-05-11 MED ORDER — FENTANYL CITRATE (PF) 100 MCG/2ML IJ SOLN
INTRAMUSCULAR | Status: DC | PRN
Start: 2020-05-11 — End: 2020-05-11
  Administered 2020-05-11 (×4): 25 ug via INTRAVENOUS

## 2020-05-11 MED ORDER — EPHEDRINE SULFATE 50 MG/ML IJ SOLN
INTRAMUSCULAR | Status: DC | PRN
  Administered 2020-05-11: 20 mg via INTRAVENOUS

## 2020-05-11 MED ORDER — CEFAZOLIN SODIUM-DEXTROSE 2-4 GM/100ML-% IV SOLN
INTRAVENOUS | Status: AC
  Filled 2020-05-11: qty 100

## 2020-05-11 MED ORDER — PROPOFOL 10 MG/ML IV BOLUS
INTRAVENOUS | Status: DC | PRN
  Administered 2020-05-11: 150 mg via INTRAVENOUS

## 2020-05-11 MED ORDER — LIDOCAINE 2% (20 MG/ML) 5 ML SYRINGE
INTRAMUSCULAR | Status: AC
  Filled 2020-05-11: qty 5

## 2020-05-11 MED ORDER — FENTANYL CITRATE (PF) 100 MCG/2ML IJ SOLN
INTRAMUSCULAR | Status: AC
  Filled 2020-05-11: qty 2

## 2020-05-11 MED ORDER — OXYCODONE HCL 5 MG/5ML PO SOLN: 5.0000 mg | Freq: Once | ORAL | Status: DC | PRN

## 2020-05-11 MED ORDER — IOHEXOL 300 MG/ML  SOLN
INTRAMUSCULAR | Status: DC | PRN
  Administered 2020-05-11: 14 mL via URETHRAL

## 2020-05-11 SURGICAL SUPPLY — 41 items
BAG DRAIN URO-CYSTO SKYTR STRL (DRAIN) ×4 IMPLANT
BAG DRN RND TRDRP ANRFLXCHMBR (UROLOGICAL SUPPLIES) ×2
BAG DRN UROCATH (DRAIN) ×2
BAG URINE DRAIN 2000ML AR STRL (UROLOGICAL SUPPLIES) ×4 IMPLANT
BASKET ZERO TIP NITINOL 2.4FR (BASKET) IMPLANT
BSKT STON RTRVL ZERO TP 2.4FR (BASKET)
CATH FOLEY 2WAY SLVR  5CC 20FR (CATHETERS) ×4
CATH FOLEY 2WAY SLVR 5CC 20FR (CATHETERS) ×2 IMPLANT
CATH INTERMIT  6FR 70CM (CATHETERS) ×4 IMPLANT
CLOTH BEACON ORANGE TIMEOUT ST (SAFETY) ×4 IMPLANT
ELECT REM PT RETURN 9FT ADLT (ELECTROSURGICAL) ×4
ELECTRODE REM PT RTRN 9FT ADLT (ELECTROSURGICAL) ×2 IMPLANT
FIBER LASER FLEXIVA 365 (UROLOGICAL SUPPLIES) IMPLANT
FIBER LASER TRAC TIP (UROLOGICAL SUPPLIES) IMPLANT
GLOVE BIO SURGEON STRL SZ8 (GLOVE) ×4 IMPLANT
GLOVE BIOGEL PI IND STRL 7.5 (GLOVE) ×4 IMPLANT
GLOVE BIOGEL PI INDICATOR 7.5 (GLOVE) ×4
GOWN STRL REUS W/ TWL LRG LVL3 (GOWN DISPOSABLE) IMPLANT
GOWN STRL REUS W/ TWL XL LVL3 (GOWN DISPOSABLE) ×4 IMPLANT
GOWN STRL REUS W/TWL LRG LVL3 (GOWN DISPOSABLE)
GOWN STRL REUS W/TWL XL LVL3 (GOWN DISPOSABLE) ×8
GUIDEWIRE ANG ZIPWIRE 038X150 (WIRE) ×4 IMPLANT
GUIDEWIRE STR DUAL SENSOR (WIRE) IMPLANT
IV NS IRRIG 3000ML ARTHROMATIC (IV SOLUTION) ×8 IMPLANT
KIT TURNOVER CYSTO (KITS) ×4 IMPLANT
LOOP CUT BIPOLAR 24F LRG (ELECTROSURGICAL) ×4 IMPLANT
LOOP MONOPOLAR YLW (ELECTROSURGICAL) IMPLANT
MANIFOLD NEPTUNE II (INSTRUMENTS) ×4 IMPLANT
NDL SAFETY ECLIPSE 18X1.5 (NEEDLE) IMPLANT
NEEDLE HYPO 18GX1.5 SHARP (NEEDLE)
NEEDLE HYPO 22GX1.5 SAFETY (NEEDLE) IMPLANT
NS IRRIG 500ML POUR BTL (IV SOLUTION) ×4 IMPLANT
PACK CYSTO (CUSTOM PROCEDURE TRAY) ×4 IMPLANT
SHEATH URETERAL 12FRX35CM (MISCELLANEOUS) IMPLANT
STENT URET 6FRX26 CONTOUR (STENTS) ×4 IMPLANT
SYR 20ML LL LF (SYRINGE) ×4 IMPLANT
TUBE CONNECTING 12'X1/4 (SUCTIONS) ×1
TUBE CONNECTING 12X1/4 (SUCTIONS) ×3 IMPLANT
TUBING UROLOGY SET (TUBING) IMPLANT
WATER STERILE IRR 3000ML UROMA (IV SOLUTION) IMPLANT
WATER STERILE IRR 500ML POUR (IV SOLUTION) ×4 IMPLANT

## 2020-05-11 NOTE — Transfer of Care (Signed)
Immediate Anesthesia Transfer of Care Note  Patient: [name]  Procedure(s) Performed: Procedure(s) (LRB): CYSTOSCOPY WITH BIOPSY (N/A) CYSTOSCOPY WITH LEFT URETEROSCOPY/RETROGRADE PYELOGRAM AND STENT PLACEMENT (Left) TRANSURETHRAL RESECTION OF BLADDER TUMOR (TURBT) (N/A)  Patient Location: PACU  Anesthesia Type: General  Level of Consciousness: awake, sedated, patient cooperative and responds to stimulation  Airway & Oxygen Therapy: Patient Spontanous Breathing and Patient connected to [place] 02 and soft FM   Post-op Assessment: Report given to PACU RN, Post -op Vital signs reviewed and stable and Patient moving all extremities  Post vital signs: Reviewed and stable  Complications: No apparent anesthesia complications

## 2020-05-11 NOTE — Op Note (Signed)
Preoperative diagnosis: History of bladder cancer with lesion in left trigonal/ureteral orifice region  Postoperative diagnosis: Same, with left ureteral orifice obscured by lesion, mild hydroureteronephrosis  Principal procedure: Cystoscopy, left retrograde ureteropyelogram, resection of probable bladder tumor and left trigonal/ureteral orifice region, 3 cm in size, fluoroscopic interpretation, left ureteroscopy, placement of 6 French by 26 cm contour double-J stent without tether  Surgeon: Meliss Fleek  Anesthesia: General with LMA  Estimated blood loss: Less than 30 mL  Specimen: Bladder tumor fragments, for permanent pathology  Drains: 66 French Foley catheter to leg bag, 6 [place] by 26 cm contour double-J stent without tether  Complications: None  Indications: 79 year old male with history of urothelial carcinoma the bladder.  Recent cystoscopy in the office and cytologies revealed a lesion around the left ureteral orifice and atypical cytology.  He presents at this time for probable resection of this area, retrograde pyelogram, possible ureteroscopy and placement of stent.  I discussed the procedure as well as risks and complications with the patient.  These include but are not limited to bladder perforation, bleeding, need for catheter, urinary tract infection, ureteral injury, among others.  He understands and desires to proceed.  Findings: Prostate was obstructive with trilobar hypertrophy.  There were no urethral lesions.  Right ureteral orifice was normal in configuration location.  Left ureteral orifice was obscured by a lesion which was nodular and papillary in nature, approximately 3 cm in size.  No other bladder lesions were noted.  There were mild trabeculations scattered throughout the bladder.  Once the retrograde study was performed, ureter displayed mild tortuosity, mild colonization and widening, and there was mild to moderate pyelocaliectasis.  There were no filling  defects.  Description of procedure: The patient was properly identified and marked in the holding area.  He received preoperative IV antibiotics was taken to the operating room where general anesthetic was administered with the LMA.  He was placed in the dorsolithotomy position.  Genitalia and perineum were prepped and draped.  Proper timeout was performed.  36 French panendoscope was advanced under direct vision through the urethra into the bladder.  There was trilobar hypertrophy with obstructive lateral lobes noted.  Bladder was inspected circumferentially.  The right ureteral orifice was easily identified.  I could not identify the left ureteral orifice, and I attempted to identify this by probing with a sensor tip wire which had been advanced through a 6 [place] open-ended catheter.  This was unsuccessful after multiple attempts.  At this point, the cystoscope was removed.  I placed the 30 degree lens using the visual obturator.  I then placed the resectoscope and cutting loop.  The entire 3 cm lesion overlying the left ureteral orifice was resected down to the muscular layers using cutting current.  At this point, the ureteral orifice became evident.  There was no tumor protruding through the orifice.  I carefully coagulated the base of the resected area, as well as the mildly erythematous urothelium around this.  There was adequate hemostasis.  I took great caution not to come close to the ureteral orifice.  I was then able to catheterize the ureteral orifice using an open-ended catheter and gentle retrograde ureteropyelogram was performed using Omnipaque.  The above-mentioned findings were noted.  I then advanced a zip wire through the open-ended catheter, up to the renal pelvis.  The open-ended catheter was advanced slightly, the zip wire removed and replaced with a sensor tip guidewire.  The sensor tip guidewire was then left in place, the  cystoscope and open-ended catheter removed.  I then passed a 4.5  [place] single-lumen semirigid ureteroscope under direct vision into the bladder, up into the ureter and all the way up through the ureter to the ureteropelvic junction.  No lesions were seen.  The ureteroscope was then removed.  The guidewire was then backloaded through the cystoscope and a 6 [place] by 26 cm contour double-J stent with tether removed was placed.  After adequate positioning and the guidewire being removed, there were excellent proximal distal curl seen using fluoroscopy and cystoscopy, respectively.  At this point the small tumor fragments were irrigated from the bladder and sent for permanent pathology.  The resected bed was inspected and found to be hemostatic.  At this point, the procedure was terminated.  The scope was removed and a 38 [place] Foley catheter was placed with the balloon filled with 10 cc of water.  This was then hooked to a drainage bag.  The patient was then awakened and taken to the PACU in stable condition, having tolerated the procedure well.

## 2020-05-11 NOTE — Anesthesia Preprocedure Evaluation (Addendum)
Anesthesia Evaluation  Patient identified by MRN, date of birth, ID band Patient awake    Reviewed: Allergy & Precautions, NPO status , Patient's Chart, lab work & pertinent test results  History of Anesthesia Complications Negative for: history of anesthetic complications  Airway Mallampati: II  TM Distance: >3 FB Neck ROM: Full    Dental  (+) Lower Dentures, Upper Dentures   Pulmonary former smoker,    Pulmonary exam normal        Cardiovascular hypertension, Pt. on medications Normal cardiovascular exam     Neuro/Psych  Headaches,  Neuromuscular disease (peripheral neuropathy) negative psych ROS   GI/Hepatic Neg liver ROS, GERD  Controlled,  Endo/Other  diabetes, Type 2Hypothyroidism   Renal/GU negative Renal ROS     Musculoskeletal negative musculoskeletal ROS (+)   Abdominal   Peds  Hematology negative hematology ROS (+)   Anesthesia Other Findings Covid test negative   Reproductive/Obstetrics                            Anesthesia Physical Anesthesia Plan  ASA: II  Anesthesia Plan: General   Post-op Pain Management:    Induction: Intravenous  PONV Risk Score and Plan: 2 and Treatment may vary due to age or medical condition, Ondansetron and Dexamethasone  Airway Management Planned: LMA  Additional Equipment: None  Intra-op Plan:   Post-operative Plan: Extubation in OR  Informed Consent: I have reviewed the patients History and Physical, chart, labs and discussed the procedure including the risks, benefits and alternatives for the proposed anesthesia with the patient or authorized representative who has indicated his/her understanding and acceptance.     Dental advisory given  Plan Discussed with: CRNA and Anesthesiologist  Anesthesia Plan Comments:        Anesthesia Quick Evaluation

## 2020-05-11 NOTE — Discharge Instructions (Signed)
1. You may see some blood in the urine and may have some burning with urination for 48-72 hours. You also may notice that you have to urinate more frequently or urgently after your procedure which is normal.  2. You should call should you develop an inability urinate, fever > 101, persistent nausea and vomiting that prevents you from eating or drinking to stay hydrated.  3. If you have a stent, you will likely urinate more frequently and urgently until the stent is removed and you may experience some discomfort/pain in the lower abdomen and flank especially when urinating. You may take pain medication prescribed to you if needed for pain. You may also intermittently have blood in the urine until the stent is removed. 4. If you have a catheter, you will be taught how to take care of the catheter by the nursing staff prior to discharge from the hospital.  You may periodically feel a strong urge to void with the catheter in place.  This is a bladder spasm and most often can occur when having a bowel movement or moving around. It is typically self-limited and usually will stop after a few minutes. OK to remove catheter on Friday morning. You may use some Vaseline or Neosporin around the tip of the catheter to reduce friction at the tip of the penis. You may also see some blood in the urine.  A very small amount of blood can make the urine look quite red.  As long as the catheter is draining well, there usually is not a problem.  However, if the catheter is not draining well and is bloody, you should call the office [phone]) to notify us.  Ureteral Stent Implantation, Care After This sheet gives you information about how to care for yourself after your procedure. Your health care provider may also give you more specific instructions. If you have problems or questions, contact your health care provider. What can I expect after the procedure? After the procedure, it is common to have:  Nausea.  Mild pain  when you urinate. You may feel this pain in your lower back or lower abdomen. The pain should stop within a few minutes after you urinate. This may last for up to 1 week.  A small amount of blood in your urine for several days. Follow these instructions at home: Medicines  Take over-the-counter and prescription medicines only as told by your health care provider.  If you were prescribed an antibiotic medicine, take it as told by your health care provider. Do not stop taking the antibiotic even if you start to feel better.  Do not drive for 24 hours if you were given a sedative during your procedure.  Ask your health care provider if the medicine prescribed to you requires you to avoid driving or using heavy machinery. Activity  Rest as told by your health care provider.  Avoid sitting for a long time without moving. Get up to take short walks every 1-2 hours. This is important to improve blood flow and breathing. Ask for help if you feel weak or unsteady.  Return to your normal activities as told by your health care provider. Ask your health care provider what activities are safe for you. General instructions   Watch for any blood in your urine. Call your health care provider if the amount of blood in your urine increases.  If you have a catheter: ? Follow instructions from your health care provider about taking care of your catheter and  collection bag. ? Do not take baths, swim, or use a hot tub until your health care provider approves. Ask your health care provider if you may take showers. You may only be allowed to take sponge baths.  Drink enough fluid to keep your urine pale yellow.  Do not use any products that contain nicotine or tobacco, such as cigarettes, e-cigarettes, and chewing tobacco. These can delay healing after surgery. If you need help quitting, ask your health care provider.  Keep all follow-up visits as told by your health care provider. This is  important. Contact a health care provider if:  You have pain that gets worse or does not get better with medicine, especially pain when you urinate.  You have difficulty urinating.  You feel nauseous or you vomit repeatedly during a period of more than 2 days after the procedure. Get help right away if:  Your urine is dark red or has blood clots in it.  You are leaking urine (have incontinence).  The end of the stent comes out of your urethra.  You cannot urinate.  You have sudden, sharp, or severe pain in your abdomen or lower back.  You have a fever.  You have swelling or pain in your legs.  You have difficulty breathing. Summary  After the procedure, it is common to have mild pain when you urinate that goes away within a few minutes after you urinate. This may last for up to 1 week.  Watch for any blood in your urine. Call your health care provider if the amount of blood in your urine increases.  Take over-the-counter and prescription medicines only as told by your health care provider.  Drink enough fluid to keep your urine pale yellow. This information is not intended to replace advice given to you by your health care provider. Make sure you discuss any questions you have with your health care provider. Document Revised: 05/05/2018 Document Reviewed: 05/06/2018 Elsevier Patient Education  [place], Adult An indwelling urinary catheter is a thin tube that is put into your bladder. The tube helps to drain pee (urine) out of your body. The tube goes in through your urethra. Your urethra is where pee comes out of your body. Your pee will come out through the catheter, then it will go into a bag (drainage bag). Take good care of your catheter so it will work well. How to wear your catheter and bag Supplies needed  Sticky tape (adhesive tape) or a leg strap.  Alcohol wipe or soap and water (if you use tape).  A clean towel  (if you use tape).  Large overnight bag.  Smaller bag (leg bag). Wearing your catheter Attach your catheter to your leg with tape or a leg strap.  Make sure the catheter is not pulled tight.  If a leg strap gets wet, take it off and put on a dry strap.  If you use tape to hold the bag on your leg: 1. Use an alcohol wipe or soap and water to wash your skin where the tape made it sticky before. 2. Use a clean towel to pat-dry that skin. 3. Use new tape to make the bag stay on your leg. Wearing your bags You should have been given a large overnight bag.  You may wear the overnight bag in the day or night.  Always have the overnight bag lower than your bladder.  Do not let the bag touch the floor.  Before you go to sleep, put a clean plastic bag in a wastebasket. Then hang the overnight bag inside the wastebasket. You should also have a smaller leg bag that fits under your clothes.  Always wear the leg bag below your knee.  Do not wear your leg bag at night. How to care for your skin and catheter Supplies needed  A clean washcloth.  Water and mild soap.  A clean towel. Caring for your skin and catheter      Clean the skin around your catheter every day: 1. Wash your hands with soap and water. 2. Wet a clean washcloth in warm water and mild soap. 3. Clean the skin around your urethra.  If you are male:  Gently spread the folds of skin around your vagina (labia).  With the washcloth in your other hand, wipe the inner side of your labia on each side. Wipe from front to back.  If you are male:  Pull back any skin that covers the end of your penis (foreskin).  With the washcloth in your other hand, wipe your penis in small circles. Start wiping at the tip of your penis, then move away from the catheter.  Move the foreskin back in place, if needed. 4. With your free hand, hold the catheter close to where it goes into your body.  Keep holding the catheter during  cleaning so it does not get pulled out. 5. With the washcloth in your other hand, clean the catheter.  Only wipe downward on the catheter.  Do not wipe upward toward your body. Doing this may push germs into your urethra and cause infection. 6. Use a clean towel to pat-dry the catheter and the skin around it. Make sure to wipe off all soap. 7. Wash your hands with soap and water.  Shower every day. Do not take baths.  Do not use cream, ointment, or lotion on the area where the catheter goes into your body, unless your doctor tells you to.  Do not use powders, sprays, or lotions on your genital area.  Check your skin around the catheter every day for signs of infection. Check for: ? Redness, swelling, or pain. ? Fluid or blood. ? Warmth. ? Pus or a bad smell. How to empty the bag Supplies needed  Rubbing alcohol.  Gauze pad or cotton ball.  Tape or a leg strap. Emptying the bag Pour the pee out of your bag when it is ?- full, or at least 2-3 times a day. Do this for your overnight bag and your leg bag. 1. Wash your hands with soap and water. 2. Separate (detach) the bag from your leg. 3. Hold the bag over the toilet or a clean pail. Keep the bag lower than your hips and bladder. This is so the pee (urine) does not go back into the tube. 4. Open the pour spout. It is at the bottom of the bag. 5. Empty the pee into the toilet or pail. Do not let the pour spout touch any surface. 6. Put rubbing alcohol on a gauze pad or cotton ball. 7. Use the gauze pad or cotton ball to clean the pour spout. 8. Close the pour spout. 9. Attach the bag to your leg with tape or a leg strap. 10. Wash your hands with soap and water. Follow instructions for cleaning the drainage bag:  From the product maker.  As told by your doctor. How to change the bag Supplies needed  Alcohol wipes.  A clean bag.  Tape or a leg strap. Changing the bag Replace your bag when it starts to leak, smell  bad, or look dirty. 1. Wash your hands with soap and water. 2. Separate the dirty bag from your leg. 3. Pinch the catheter with your fingers so that pee does not spill out. 4. Separate the catheter tube from the bag tube where these tubes connect (at the connection valve). Do not let the tubes touch any surface. 5. Clean the end of the catheter tube with an alcohol wipe. Use a different alcohol wipe to clean the end of the bag tube. 6. Connect the catheter tube to the tube of the clean bag. 7. Attach the clean bag to your leg with tape or a leg strap. Do not make the bag tight on your leg. 8. Wash your hands with soap and water. General rules   Never pull on your catheter. Never try to take it out. Doing that can hurt you.  Always wash your hands before and after you touch your catheter or bag. Use a mild, fragrance-free soap. If you do not have soap and water, use hand sanitizer.  Always make sure there are no twists or bends (kinks) in the catheter tube.  Always make sure there are no leaks in the catheter or bag.  Drink enough fluid to keep your pee pale yellow.  Do not take baths, swim, or use a hot tub.  If you are male, wipe from front to back after you poop (have a bowel movement). Contact a doctor if:  Your pee is cloudy.  Your pee smells worse than usual.  Your catheter gets clogged.  Your catheter leaks.  Your bladder feels full. Get help right away if:  You have redness, swelling, or pain where the catheter goes into your body.  You have fluid, blood, pus, or a bad smell coming from the area where the catheter goes into your body.  Your skin feels warm where the catheter goes into your body.  You have a fever.  You have pain in your: ? Belly (abdomen). ? Legs. ? Lower back. ? Bladder.  You see blood in the catheter.  Your pee is pink or red.  You feel sick to your stomach (nauseous).  You throw up (vomit).  You have chills.  Your pee is not  draining into the bag.  Your catheter gets pulled out. Summary  An indwelling urinary catheter is a thin tube that is placed into the bladder to help drain pee (urine) out of the body.  The catheter is placed into the part of the body that drains pee from the bladder (urethra).  Taking good care of your catheter will keep it working properly and help prevent problems.  Always wash your hands before and after touching your catheter or bag.  Never pull on your catheter or try to take it out. This information is not intended to replace advice given to you by your health care provider. Make sure you discuss any questions you have with your health care provider. Document Revised: 11/20/2018 Document Reviewed: 03/14/2017 Elsevier Patient Education  [place] Instructions  Activity: Get plenty of rest for the remainder of the day. A responsible individual must stay with you for 24 hours following the procedure.  For the next 24 hours, DO NOT: -Drive a car -Paediatric nurse -Drink alcoholic beverages -Take any medication unless instructed by your physician -Make any legal  decisions or sign important papers.  Meals: Start with liquid foods such as gelatin or soup. Progress to regular foods as tolerated. Avoid greasy, spicy, heavy foods. If nausea and/or vomiting occur, drink only clear liquids until the nausea and/or vomiting subsides. Call your physician if vomiting continues.  Special Instructions/Symptoms: Your throat may feel dry or sore from the anesthesia or the breathing tube placed in your throat during surgery. If this causes discomfort, gargle with warm salt water. The discomfort should disappear within 24 hours.

## 2020-05-11 NOTE — Anesthesia Procedure Notes (Signed)
Procedure Name: LMA Insertion Date/Time: 05/11/2020 1:30 PM Performed by: Justice Rocher, CRNA Pre-anesthesia Checklist: Patient identified, Emergency Drugs available, Suction available, Patient being monitored and Timeout performed Patient Re-evaluated:Patient Re-evaluated prior to induction Oxygen Delivery Method: Circle system utilized Preoxygenation: Pre-oxygenation with 100% oxygen Induction Type: IV induction Ventilation: Mask ventilation without difficulty LMA: LMA inserted LMA Size: 5.0 Number of attempts: 1 Airway Equipment and Method: Bite block Placement Confirmation: breath sounds checked- equal and bilateral,  CO2 detector and positive ETCO2 Tube secured with: Tape Dental Injury: Teeth and Oropharynx as per pre-operative assessment

## 2020-05-11 NOTE — H&P (Signed)
H&P  Chief Complaint: Bladder cancer  History of Present Illness: 79 yo male presents for cysto/RGP and bladder biopsies for possible recurrence of bladder cancer.  Past Medical History:  Diagnosis Date  . Bladder cancer ([place])   . BPH (benign prostatic hypertrophy)   . Cataracts, both eyes   . DM type 2 (diabetes mellitus, type 2) ([place])   . Full dentures   . GERD (gastroesophageal reflux disease)   . Glaucoma no peripheral or side vision   right eye  . Glaucoma, right eye   . Hyperlipidemia   . Hypertension   . Hypothyroidism   . Local recurrence of cancer of urinary bladder [facility]) urologsit-  dr Diona Fanti   1st dx 01-29-2016--  now recurrent 05-31-2016  . Lower urinary tract symptoms (LUTS)   . Migraine   . Peripheral neuropathy    middle finger left hand  . Pneumonia 2019  . Wears glasses     Past Surgical History:  Procedure Laterality Date  . ANTERIOR CERVICAL DECOMP/DISCECTOMY FUSION  08-15-2000   C5 -- C6  . ANTERIOR CERVICAL DECOMP/DISCECTOMY FUSION  03-04-2008   C6 -- C7  . BACK SURGERY  2002 & 2009  . CYSTOSCOPY WITH BIOPSY N/A 01/29/2016   Procedure: CYSTOSCOPY WITH BIOPSY;  Surgeon: Franchot Gallo, MD;  Location: [facility]  Service: Urology;  Laterality: N/A;  . CYSTOSCOPY WITH BIOPSY N/A 05/31/2016   Procedure: CYSTOSCOPY WITH BIOPSY;  Surgeon: Franchot Gallo, MD;  Location[facility];  Service: Urology;  Laterality: N/A;  . [place]  . [place] and 2000  . lower back  06/01/2019, 02-10-2020  . nose cancer  areas removed  2019   x 2  . surgery for bladder cancer  2017    Home Medications:  Allergies as of 05/11/2020   No Known Allergies     Medication List    Notice   Cannot display discharge medications because the patient has not yet been admitted.     Allergies: No Known Allergies  Family History  Problem Relation Age of Onset  . Heart disease Mother   .  Heart disease Father     Social History:  reports that he quit smoking about 15 years ago. His smoking use included cigarettes. He has a 50.00 pack-year smoking history. He has never used smokeless tobacco. He reports that he does not drink alcohol and does not use drugs.  ROS: A complete review of systems was performed.  All systems are negative except for pertinent findings as noted.  Physical Exam:  Vital signs in last 24 hours: Ht 5\' 10"  (1.778 m)   Wt 86.2 kg   BMI 27.26 kg/m  Constitutional:  Alert and oriented, No acute distress Cardiovascular: Regular rate  Respiratory: Normal respiratory effort GI: Abdomen is soft, nontender, nondistended, no abdominal masses. No CVAT.  Genitourinary: Normal male phallus, testes are descended bilaterally and non-tender and without masses, scrotum is normal in appearance without lesions or masses, perineum is normal on inspection. Lymphatic: No lymphadenopathy Neurologic: Grossly intact, no focal deficits Psychiatric: Normal mood and affect  Laboratory Data:  No results for input(s): WBC, HGB, HCT, PLT in the last 72 hours.  No results for input(s): NA, K, CL, GLUCOSE, BUN, CALCIUM, CREATININE in the last 72 hours.  Invalid input(s): CO3   No results found for this or any previous visit (from the past 24 hour(s)). Recent Results (from the past  240 hour(s))  SARS CORONAVIRUS 2 (TAT 6-24 HRS) Nasopharyngeal Nasopharyngeal Swab     Status: None   Collection Time: 05/08/20 10:59 AM   Specimen: Nasopharyngeal Swab  Result Value Ref Range Status   SARS Coronavirus 2 NEGATIVE NEGATIVE Final    Comment: (NOTE) SARS-CoV-2 target nucleic acids are NOT DETECTED.  The SARS-CoV-2 RNA is generally detectable in upper and lower respiratory specimens during the acute phase of infection. Negative results do not preclude SARS-CoV-2 infection, do not rule out co-infections with other pathogens, and should not be used as the sole basis for treatment  or other patient management decisions. Negative results must be combined with clinical observations, patient history, and epidemiological information. The expected result is Negative.  Fact Sheet for Patients: SugarRoll.be  Fact Sheet for Healthcare Providers: https://www.woods-mathews.com/  This test is not yet approved or cleared by the [place] FDA and  has been authorized for detection and/or diagnosis of SARS-CoV-2 by FDA under an Emergency Use Authorization (EUA). This EUA will remain  in effect (meaning this test can be used) for the duration of the COVID-19 declaration under Se ction 564(b)(1) of the Act, 21 U.S.C. section 360bbb-3(b)(1), unless the authorization is terminated or revoked sooner.  Performed at [facility], [place] [address], [place], [place] [address]     Renal Function: No results for input(s): CREATININE in the last 168 hours. CrCl cannot be calculated (Patient's most recent lab result is older than the maximum 21 days allowed.).  Radiologic Imaging: No results found.  Impression/Assessment:  H/O bladder cancer w/ abnormal cytology and bladder lesion  Plan:  Cysto, Lt RGP, bladder biopsy

## 2020-05-11 NOTE — Interval H&P Note (Signed)
History and Physical Interval Note:  05/11/2020 1:18 PM  Keith [name]  has presented today for surgery, with the diagnosis of HISTORY OF BLADDER CANCER.  The various methods of treatment have been discussed with the patient and family. After consideration of risks, benefits and other options for treatment, the patient has consented to  Procedure(s) with comments: CYSTOSCOPY WITH BIOPSY (N/A) - 30 MINS CYSTOSCOPY WITH RETROGRADE PYELOGRAM (Left) as a surgical intervention.  The patient's history has been reviewed, patient examined, no change in status, stable for surgery.  I have reviewed the patient's chart and labs.  Questions were answered to the patient's satisfaction.     Lillette Boxer Reily Treloar

## 2020-05-11 NOTE — Anesthesia Postprocedure Evaluation (Signed)
Anesthesia Post Note  Patient: Keith [name]  Procedure(s) Performed: Cystoscopy with left retrograde ureteropyelogram, resection of probable bladder tumor and left trigonal/ureteral orifice region, 3 cm in size, fluoroscopic interpretation, left ureteroscopy, placement of 6 French by 26 cm contour double-J stent without tether (Left Ureter)     Patient location during evaluation: PACU Anesthesia Type: General Level of consciousness: awake and alert Pain management: pain level controlled Vital Signs Assessment: post-procedure vital signs reviewed and stable Respiratory status: spontaneous breathing, nonlabored ventilation, respiratory function stable and patient connected to nasal cannula oxygen Cardiovascular status: blood pressure returned to baseline and stable Postop Assessment: no apparent nausea or vomiting Anesthetic complications: no   No complications documented.  Last Vitals:  Vitals:   05/11/20 1223 05/11/20 1435  BP: 125/72 (!) 151/71  Pulse: 75 70  Resp: 15 12  Temp: 36.9 C   SpO2: 98% 99%    Last Pain:  Vitals:   05/11/20 1435  TempSrc:   PainSc: 0-No pain                 [name] S

## 2020-05-12 ENCOUNTER — Encounter (HOSPITAL_BASED_OUTPATIENT_CLINIC_OR_DEPARTMENT_OTHER): Payer: Self-pay | Admitting: Urology

## 2020-05-12 LAB — SURGICAL PATHOLOGY

## 2020-05-31 DIAGNOSIS — C678 Malignant neoplasm of overlapping sites of bladder: Secondary | ICD-10-CM | POA: Diagnosis not present

## 2020-06-02 DIAGNOSIS — E039 Hypothyroidism, unspecified: Secondary | ICD-10-CM | POA: Diagnosis not present

## 2020-06-02 DIAGNOSIS — Z125 Encounter for screening for malignant neoplasm of prostate: Secondary | ICD-10-CM | POA: Diagnosis not present

## 2020-06-02 DIAGNOSIS — E1169 Type 2 diabetes mellitus with other specified complication: Secondary | ICD-10-CM | POA: Diagnosis not present

## 2020-06-02 DIAGNOSIS — E785 Hyperlipidemia, unspecified: Secondary | ICD-10-CM | POA: Diagnosis not present

## 2020-06-05 ENCOUNTER — Other Ambulatory Visit: Payer: Self-pay | Admitting: Urology

## 2020-06-09 DIAGNOSIS — G894 Chronic pain syndrome: Secondary | ICD-10-CM | POA: Diagnosis not present

## 2020-06-09 DIAGNOSIS — Z0000 Encounter for general adult medical examination without abnormal findings: Secondary | ICD-10-CM | POA: Diagnosis not present

## 2020-06-09 DIAGNOSIS — E785 Hyperlipidemia, unspecified: Secondary | ICD-10-CM | POA: Diagnosis not present

## 2020-06-09 DIAGNOSIS — E039 Hypothyroidism, unspecified: Secondary | ICD-10-CM | POA: Diagnosis not present

## 2020-06-09 DIAGNOSIS — I10 Essential (primary) hypertension: Secondary | ICD-10-CM | POA: Diagnosis not present

## 2020-06-09 DIAGNOSIS — C679 Malignant neoplasm of bladder, unspecified: Secondary | ICD-10-CM | POA: Diagnosis not present

## 2020-06-09 DIAGNOSIS — N401 Enlarged prostate with lower urinary tract symptoms: Secondary | ICD-10-CM | POA: Diagnosis not present

## 2020-06-09 DIAGNOSIS — Z23 Encounter for immunization: Secondary | ICD-10-CM | POA: Diagnosis not present

## 2020-06-09 DIAGNOSIS — R82998 Other abnormal findings in urine: Secondary | ICD-10-CM | POA: Diagnosis not present

## 2020-06-09 DIAGNOSIS — E1169 Type 2 diabetes mellitus with other specified complication: Secondary | ICD-10-CM | POA: Diagnosis not present

## 2020-07-10 ENCOUNTER — Encounter (HOSPITAL_BASED_OUTPATIENT_CLINIC_OR_DEPARTMENT_OTHER): Payer: Self-pay | Admitting: Urology

## 2020-07-11 ENCOUNTER — Other Ambulatory Visit: Payer: Self-pay

## 2020-07-11 ENCOUNTER — Encounter (HOSPITAL_BASED_OUTPATIENT_CLINIC_OR_DEPARTMENT_OTHER): Payer: Self-pay | Admitting: Urology

## 2020-07-11 DIAGNOSIS — R600 Localized edema: Secondary | ICD-10-CM

## 2020-07-11 HISTORY — DX: Localized edema: R60.0

## 2020-07-11 NOTE — Progress Notes (Addendum)
Spoke w/ via phone for pre-op interview---pt Lab needs dos----  I stat 8, ekg              Lab results------ekg 05-11-2020 epic, chest xray 02-12-2020 epic COVID test ------07-14-2020 1300 Arrive at -------745 am 07-17-2020 NPO after MN NO Solid Food.  Clear liquids from MN until---645 am then npo Medications to take morning of surgery -----gabapentin, tamsulosin, levothyroxine, eye drop Diabetic medication -----none day of surgery Patient Special Instructions -----none Pre-Op special Istructions -----none Patient verbalized understanding of instructions that were given at this phone interview. Patient denies shortness of breath, chest pain, fever, cough at this phone interview.  Pt wife rebecca may need to help with meds in pre op pt very hoh

## 2020-07-14 ENCOUNTER — Other Ambulatory Visit (HOSPITAL_COMMUNITY)
Admission: RE | Admit: 2020-07-14 | Discharge: 2020-07-14 | Disposition: A | Discharge status: Home / Self Care | Payer: Medicare HMO | Source: Ambulatory Visit | Attending: Urology | Admitting: Urology

## 2020-07-14 DIAGNOSIS — Z01812 Encounter for preprocedural laboratory examination: Secondary | ICD-10-CM | POA: Diagnosis not present

## 2020-07-14 DIAGNOSIS — Z20822 Contact with and (suspected) exposure to covid-19: Secondary | ICD-10-CM | POA: Diagnosis not present

## 2020-07-15 LAB — SARS CORONAVIRUS 2 (TAT 6-24 HRS): SARS Coronavirus 2: NEGATIVE

## 2020-07-16 NOTE — H&P (Signed)
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  Past Medical History:  Diagnosis Date  . Bladder cancer ([place])   . Blind right eye   . BPH (benign prostatic hypertrophy)   . Cataracts, both eyes   . Depression   . DM type 2 (diabetes mellitus, type 2) ([place])   . DOE (dyspnea on exertion)    with heavy exertion  . Full dentures   . GERD (gastroesophageal reflux disease)   . Glaucoma no peripheral or side vision   right eye blind  . Glaucoma, right eye   . HOH (hard of hearing)   . Hyperlipidemia   . Hypertension   . Hypothyroidism   . Local recurrence of cancer of urinary bladder [facility]) urologsit-  dr Diona Fanti   1st dx 01-29-2016--  now recurrent 05-31-2016  . Lower extremity edema 07/11/2020   right swells more than left goes down at night after sleeping  . Lower urinary tract symptoms (LUTS) 2019  . Migraine   . Peripheral neuropathy    middle finger left hand  . Pneumonia 2019  . Skin abnormality    base of spine looks like pimple has had since 07-01-2020, no drainage area intact  . Wears glasses     Past Surgical History:  Procedure Laterality Date  . ANTERIOR CERVICAL DECOMP/DISCECTOMY FUSION  08-15-2000   C5 -- C6  . ANTERIOR CERVICAL DECOMP/DISCECTOMY FUSION  03-04-2008   C6 -- C7  . areas removed from back at spine  2021 jan and aug 2021  . CYSTOSCOPY W/ RETROGRADES Left 05/11/2020   Procedure: Cystoscopy with left retrograde ureteropyelogram, resection of probable bladder tumor and left trigonal/ureteral orifice region, 3 cm in size, fluoroscopic interpretation, left ureteroscopy, placement of 6 French by 26 cm contour double-J stent without tether;  Surgeon: Franchot Gallo, MD;  Location: [facility];  Service: Urology;  Laterality:  Left;  . CYSTOSCOPY WITH BIOPSY N/A 01/29/2016   Procedure: CYSTOSCOPY WITH BIOPSY;  Surgeon: Franchot Gallo, MD;  Location: [facility];  Service: Urology;  Laterality: N/A;  . CYSTOSCOPY WITH BIOPSY N/A 05/31/2016   Procedure: CYSTOSCOPY WITH BIOPSY;  Surgeon: Franchot Gallo, MD;  Location: [facility];  Service: Urology;  Laterality: N/A;  . [place]  . [place] and 2000  . lower back  06/01/2019, 02-10-2020  . nose cancer  areas removed  2019, 2021   x 2 / 3/ 2021 small area removed from nose  . surgery for sacral fracture  40 yrs ago    Home Medications:  Allergies as of 07/16/2020   No Known Allergies     Medication List    Notice   Cannot display discharge medications because the patient has not yet been admitted.     Allergies: No Known Allergies  Family History  Problem Relation Age of Onset  . Heart disease Mother   . Heart disease Father     Social History:  reports that he quit smoking about 16 years ago. His smoking use included cigarettes. He has a 50.00 pack-year smoking history. He has never used smokeless tobacco. He reports current alcohol use. He reports that he does not use drugs.  ROS: A complete review of systems was  performed.  All systems are negative except for pertinent findings as noted.  Physical Exam:  Vital signs in last 24 hours: Ht 5\' 10"  (1.778 m)   Wt 85.3 kg   BMI 26.98 kg/m  Constitutional:  Alert and oriented, No acute distress Cardiovascular: Regular rate  Respiratory: Normal respiratory effort GI: Abdomen is soft, nontender, nondistended, no abdominal masses. No CVAT.  Genitourinary: Normal male phallus, testes are descended bilaterally and non-tender and without masses, scrotum is normal in appearance without lesions or masses, perineum is normal on inspection. Lymphatic: No lymphadenopathy Neurologic: Grossly intact, no focal deficits Psychiatric:  Normal mood and affect  Laboratory Data:  No results for input(s): WBC, HGB, HCT, PLT in the last 72 hours.  No results for input(s): NA, K, CL, GLUCOSE, BUN, CALCIUM, CREATININE in the last 72 hours.  Invalid input(s): CO3   No results found for this or any previous visit (from the past 24 hour(s)). Recent Results (from the past 240 hour(s))  SARS CORONAVIRUS 2 (TAT 6-24 HRS) Nasopharyngeal Nasopharyngeal Swab     Status: None   Collection Time: 07/14/20  1:27 PM   Specimen: Nasopharyngeal Swab  Result Value Ref Range Status   SARS Coronavirus 2 NEGATIVE NEGATIVE Final    Comment: (NOTE) SARS-CoV-2 target nucleic acids are NOT DETECTED.  The SARS-CoV-2 RNA is generally detectable in upper and lower respiratory specimens during the acute phase of infection. Negative results do not preclude SARS-CoV-2 infection, do not rule out co-infections with other pathogens, and should not be used as the sole basis for treatment or other patient management decisions. Negative results must be combined with clinical observations, patient history, and epidemiological information. The expected result is Negative.  Fact Sheet for Patients: SugarRoll.be  Fact Sheet for Healthcare Providers: https://www.woods-mathews.com/  This test is not yet approved or cleared by the [place] FDA and  has been authorized for detection and/or diagnosis of SARS-CoV-2 by FDA under an Emergency Use Authorization (EUA). This EUA will remain  in effect (meaning this test can be used) for the duration of the COVID-19 declaration under Se ction 564(b)(1) of the Act, 21 U.S.C. section 360bbb-3(b)(1), unless the authorization is terminated or revoked sooner.  Performed at [facility], [place] [address] [place], [place] [address]     Renal Function: No results for input(s): CREATININE in the last 168 hours. CrCl cannot be calculated (Patient's most recent lab  result is older than the maximum 21 days allowed.).  Radiologic Imaging: No results found.  Impression/Assessment:  HGNMIBC, s/p recent rsection  Plan:  Repeat TURBT

## 2020-07-17 ENCOUNTER — Encounter (HOSPITAL_BASED_OUTPATIENT_CLINIC_OR_DEPARTMENT_OTHER)
Admission: RE | Disposition: A | Discharge status: Home / Self Care | Payer: Self-pay | Source: Home / Self Care | Attending: Urology

## 2020-07-17 ENCOUNTER — Ambulatory Visit (HOSPITAL_BASED_OUTPATIENT_CLINIC_OR_DEPARTMENT_OTHER)
Admission: RE | Admit: 2020-07-17 | Discharge: 2020-07-17 | Disposition: A | Discharge status: Home / Self Care | Payer: Medicare HMO | Attending: Urology | Admitting: Urology

## 2020-07-17 ENCOUNTER — Encounter (HOSPITAL_BASED_OUTPATIENT_CLINIC_OR_DEPARTMENT_OTHER): Payer: Self-pay | Admitting: Urology

## 2020-07-17 ENCOUNTER — Ambulatory Visit (HOSPITAL_BASED_OUTPATIENT_CLINIC_OR_DEPARTMENT_OTHER): Payer: Medicare HMO | Admitting: Anesthesiology

## 2020-07-17 DIAGNOSIS — E039 Hypothyroidism, unspecified: Secondary | ICD-10-CM | POA: Diagnosis not present

## 2020-07-17 DIAGNOSIS — N400 Enlarged prostate without lower urinary tract symptoms: Secondary | ICD-10-CM | POA: Diagnosis not present

## 2020-07-17 DIAGNOSIS — Z8551 Personal history of malignant neoplasm of bladder: Secondary | ICD-10-CM | POA: Diagnosis not present

## 2020-07-17 DIAGNOSIS — C679 Malignant neoplasm of bladder, unspecified: Secondary | ICD-10-CM | POA: Diagnosis not present

## 2020-07-17 DIAGNOSIS — Z85828 Personal history of other malignant neoplasm of skin: Secondary | ICD-10-CM | POA: Insufficient documentation

## 2020-07-17 DIAGNOSIS — C678 Malignant neoplasm of overlapping sites of bladder: Secondary | ICD-10-CM | POA: Diagnosis not present

## 2020-07-17 DIAGNOSIS — Z79899 Other long term (current) drug therapy: Secondary | ICD-10-CM | POA: Insufficient documentation

## 2020-07-17 DIAGNOSIS — I10 Essential (primary) hypertension: Secondary | ICD-10-CM | POA: Diagnosis not present

## 2020-07-17 DIAGNOSIS — N3289 Other specified disorders of bladder: Secondary | ICD-10-CM | POA: Insufficient documentation

## 2020-07-17 DIAGNOSIS — N35912 Unspecified bulbous urethral stricture, male: Secondary | ICD-10-CM | POA: Insufficient documentation

## 2020-07-17 DIAGNOSIS — Z87891 Personal history of nicotine dependence: Secondary | ICD-10-CM | POA: Insufficient documentation

## 2020-07-17 DIAGNOSIS — M5126 Other intervertebral disc displacement, lumbar region: Secondary | ICD-10-CM | POA: Diagnosis not present

## 2020-07-17 HISTORY — DX: Unspecified hearing loss, unspecified ear: H91.90

## 2020-07-17 HISTORY — DX: Blindness, one eye, unspecified eye: H54.40

## 2020-07-17 HISTORY — DX: Depression, unspecified: F32.A

## 2020-07-17 HISTORY — DX: Other forms of dyspnea: R06.09

## 2020-07-17 HISTORY — DX: Dyspnea, unspecified: R06.00

## 2020-07-17 HISTORY — DX: Disorder of the skin and subcutaneous tissue, unspecified: L98.9

## 2020-07-17 HISTORY — PX: TRANSURETHRAL RESECTION OF BLADDER TUMOR: SHX2575

## 2020-07-17 LAB — POCT I-STAT, CHEM 8
BUN: 15 mg/dL (ref 8–23)
Calcium, Ion: 1.3 mmol/L (ref 1.15–1.40)
Chloride: 104 mmol/L (ref 98–111)
Creatinine, Ser: 0.7 mg/dL (ref 0.61–1.24)
Glucose, Bld: 146 mg/dL — ABNORMAL HIGH (ref 70–99)
HCT: 40 % (ref 39.0–52.0)
Hemoglobin: 13.6 g/dL (ref 13.0–17.0)
Potassium: 4 mmol/L (ref 3.5–5.1)
Sodium: 140 mmol/L (ref 135–145)
TCO2: 27 mmol/L (ref 22–32)

## 2020-07-17 LAB — GLUCOSE, CAPILLARY: Glucose-Capillary: 133 mg/dL — ABNORMAL HIGH (ref 70–99)

## 2020-07-17 SURGERY — TURBT (TRANSURETHRAL RESECTION OF BLADDER TUMOR)
Anesthesia: General | Site: Bladder

## 2020-07-17 MED ORDER — ONDANSETRON HCL 4 MG/2ML IJ SOLN
INTRAMUSCULAR | Status: AC
  Filled 2020-07-17: qty 2

## 2020-07-17 MED ORDER — STERILE WATER FOR IRRIGATION IR SOLN
Status: DC | PRN
  Administered 2020-07-17: 500 mL

## 2020-07-17 MED ORDER — LACTATED RINGERS IV SOLN: INTRAVENOUS | Status: DC

## 2020-07-17 MED ORDER — CEFAZOLIN SODIUM-DEXTROSE 2-4 GM/100ML-% IV SOLN
INTRAVENOUS | Status: AC
  Filled 2020-07-17: qty 100

## 2020-07-17 MED ORDER — ACETAMINOPHEN 500 MG PO TABS
1000.0000 mg | ORAL_TABLET | Freq: Once | ORAL | Status: AC
  Administered 2020-07-17: 1000 mg via ORAL

## 2020-07-17 MED ORDER — LIDOCAINE HCL (PF) 2 % IJ SOLN
INTRAMUSCULAR | Status: AC
  Filled 2020-07-17: qty 5

## 2020-07-17 MED ORDER — PROPOFOL 10 MG/ML IV BOLUS
INTRAVENOUS | Status: DC | PRN
  Administered 2020-07-17: 160 mg via INTRAVENOUS

## 2020-07-17 MED ORDER — FENTANYL CITRATE (PF) 100 MCG/2ML IJ SOLN
INTRAMUSCULAR | Status: AC
  Filled 2020-07-17: qty 2

## 2020-07-17 MED ORDER — 0.9 % SODIUM CHLORIDE (POUR BTL) OPTIME
TOPICAL | Status: DC | PRN
  Administered 2020-07-17: 500 mL

## 2020-07-17 MED ORDER — FENTANYL CITRATE (PF) 100 MCG/2ML IJ SOLN
INTRAMUSCULAR | Status: DC | PRN
  Administered 2020-07-17 (×2): 25 ug via INTRAVENOUS
  Administered 2020-07-17: 50 ug via INTRAVENOUS

## 2020-07-17 MED ORDER — ONDANSETRON HCL 4 MG/2ML IJ SOLN
INTRAMUSCULAR | Status: DC | PRN
  Administered 2020-07-17: 4 mg via INTRAVENOUS

## 2020-07-17 MED ORDER — DEXAMETHASONE SODIUM PHOSPHATE 10 MG/ML IJ SOLN
INTRAMUSCULAR | Status: AC
  Filled 2020-07-17: qty 1

## 2020-07-17 MED ORDER — LIDOCAINE 2% (20 MG/ML) 5 ML SYRINGE
INTRAMUSCULAR | Status: DC | PRN
  Administered 2020-07-17: 80 mg via INTRAVENOUS

## 2020-07-17 MED ORDER — DEXAMETHASONE SODIUM PHOSPHATE 10 MG/ML IJ SOLN
INTRAMUSCULAR | Status: DC | PRN
  Administered 2020-07-17: 10 mg via INTRAVENOUS

## 2020-07-17 MED ORDER — CEFAZOLIN SODIUM-DEXTROSE 2-4 GM/100ML-% IV SOLN
2.0000 g | INTRAVENOUS | Status: AC
  Administered 2020-07-17: 2 g via INTRAVENOUS

## 2020-07-17 MED ORDER — PROPOFOL 10 MG/ML IV BOLUS
INTRAVENOUS | Status: AC
  Filled 2020-07-17: qty 20

## 2020-07-17 MED ORDER — SODIUM CHLORIDE 0.9 % IR SOLN
Status: DC | PRN
  Administered 2020-07-17: 3000 mL via INTRAVESICAL

## 2020-07-17 MED ORDER — ACETAMINOPHEN 500 MG PO TABS
ORAL_TABLET | ORAL | Status: AC
  Filled 2020-07-17: qty 2

## 2020-07-17 SURGICAL SUPPLY — 34 items
BAG DRAIN URO-CYSTO SKYTR STRL (DRAIN) ×3 IMPLANT
BAG DRN RND TRDRP ANRFLXCHMBR (UROLOGICAL SUPPLIES)
BAG DRN UROCATH (DRAIN) ×1
BAG URINE DRAIN 2000ML AR STRL (UROLOGICAL SUPPLIES) IMPLANT
BAG URINE LEG 500ML (DRAIN) IMPLANT
CATH FOLEY 2WAY SLVR  5CC 18FR (CATHETERS) ×2
CATH FOLEY 2WAY SLVR  5CC 20FR (CATHETERS)
CATH FOLEY 2WAY SLVR  5CC 22FR (CATHETERS)
CATH FOLEY 2WAY SLVR 5CC 18FR (CATHETERS) ×1 IMPLANT
CATH FOLEY 2WAY SLVR 5CC 20FR (CATHETERS) IMPLANT
CATH FOLEY 2WAY SLVR 5CC 22FR (CATHETERS) IMPLANT
CATH FOLEY 3WAY 30CC 22FR (CATHETERS) IMPLANT
CLOTH BEACON ORANGE TIMEOUT ST (SAFETY) ×3 IMPLANT
ELECT REM PT RETURN 9FT ADLT (ELECTROSURGICAL) ×3
ELECTRODE REM PT RTRN 9FT ADLT (ELECTROSURGICAL) ×1 IMPLANT
EVACUATOR MICROVAS BLADDER (UROLOGICAL SUPPLIES) IMPLANT
GLOVE BIO SURGEON STRL SZ8 (GLOVE) ×3 IMPLANT
GOWN STRL REUS W/TWL XL LVL3 (GOWN DISPOSABLE) ×3 IMPLANT
GUIDEWIRE STR DUAL SENSOR (WIRE) ×3 IMPLANT
HOLDER FOLEY CATH W/STRAP (MISCELLANEOUS) IMPLANT
IV NS IRRIG 3000ML ARTHROMATIC (IV SOLUTION) ×3 IMPLANT
KIT TURNOVER CYSTO (KITS) ×3 IMPLANT
LOOP CUT BIPOLAR 24F LRG (ELECTROSURGICAL) ×3 IMPLANT
MANIFOLD NEPTUNE II (INSTRUMENTS) ×3 IMPLANT
NS IRRIG 500ML POUR BTL (IV SOLUTION) ×3 IMPLANT
PACK CYSTO (CUSTOM PROCEDURE TRAY) ×3 IMPLANT
PLUG CATH AND CAP STER (CATHETERS) IMPLANT
STENT URET 6FRX26 CONTOUR (STENTS) ×3 IMPLANT
SYR TOOMEY IRRIG 70ML (MISCELLANEOUS) ×3
SYRINGE TOOMEY IRRIG 70ML (MISCELLANEOUS) ×1 IMPLANT
TUBE CONNECTING 12'X1/4 (SUCTIONS) ×1
TUBE CONNECTING 12X1/4 (SUCTIONS) ×2 IMPLANT
TUBING UROLOGY SET (TUBING) IMPLANT
WATER STERILE IRR 500ML POUR (IV SOLUTION) IMPLANT

## 2020-07-17 NOTE — Anesthesia Procedure Notes (Signed)
Procedure Name: LMA Insertion Date/Time: 07/17/2020 9:46 AM Performed by: Marcelina Mclaurin D, CRNA Pre-anesthesia Checklist: Patient identified, Emergency Drugs available, Suction available and Patient being monitored Patient Re-evaluated:Patient Re-evaluated prior to induction Oxygen Delivery Method: Circle system utilized Preoxygenation: Pre-oxygenation with 100% oxygen Induction Type: IV induction Ventilation: Mask ventilation without difficulty LMA: LMA inserted LMA Size: 4.0 Tube type: Oral Number of attempts: 1 Placement Confirmation: positive ETCO2 and breath sounds checked- equal and bilateral Tube secured with: Tape Dental Injury: Teeth and Oropharynx as per pre-operative assessment

## 2020-07-17 NOTE — Op Note (Signed)
Preoperative diagnosis: History of high-grade urothelial carcinoma, status post prior resection  Postoperative diagnosis: Same  Principal procedure: Cystoscopy, TURBT 3 cm bladder lesion, extraction of double-J stent on left  Surgeon: Miqueas Whilden  Anesthesia: General with LMA  Complications: None  Drains: 39 French Foley catheter to leg bag  Estimated blood loss: Less than 5 mL  Specimen: Bladder tumor fragments  Indications: 79 year old male with history of recurrent urothelial carcinoma.  He underwent resection of a recurrent lesion on September 30.  This revealed high-grade urothelial carcinoma.  No muscle was present in the tissue.  He presents at this time for repeat resection.  I discussed the procedure as well as risks and complications with the patient who understands and desires to proceed.  Findings: Mild contracture of the bulbous urethra, easily passed with the 26 French resectoscope sheath.  Obstructive prostate.  Mild to moderate trabeculations of the bladder wall.  Inflammatory/healing tissue around the left ureteral orifice/lateral trigonal area with stent in place.  No other obvious bladder lesions noted.  Procedure: The patient was properly identified in the holding area and was taken to the operating room where general anesthetic was administered with the LMA.  He was placed in the dorsolithotomy position, genitalia and perineum were prepped and draped, proper timeout performed.  20 French resectoscope sheath was passed using the visual obturator with the above-mentioned findings noted.  Resectoscope and cutting loop, bipolar, was then placed.  The old area of resection was again resected down into what I felt was well into the muscular tissue.  Resection was carried out around the ureteral orifice/double-J stent.  Mild amount of papillary tissue was seen on the lateral aspect of the prior resection site.  However, no other significant urothelial abnormality was noted.  All  abnormal tissue/prior resection site was resected.  Following this, cautery was applied for hemostasis.  Small fragments were removed from the bladder and sent labeled "bladder tumor".  The stent was removed.  At this point the procedure was terminated after 18 French Foley catheter was placed.  He tolerated procedure well.

## 2020-07-17 NOTE — Interval H&P Note (Signed)
History and Physical Interval Note:  07/17/2020 8:52 AM  Keith [name]  has presented today for surgery, with the diagnosis of BLADDER CANCER.  The various methods of treatment have been discussed with the patient and family. After consideration of risks, benefits and other options for treatment, the patient has consented to  Procedure(s): TRANSURETHRAL RESECTION OF BLADDER TUMOR (TURBT) (N/A) as a surgical intervention.  The patient's history has been reviewed, patient examined, no change in status, stable for surgery.  I have reviewed the patient's chart and labs.  Questions were answered to the patient's satisfaction.     Lillette Boxer Stormey Wilborn

## 2020-07-17 NOTE — Anesthesia Preprocedure Evaluation (Addendum)
Anesthesia Evaluation  Patient identified by MRN, date of birth, ID band Patient awake    Reviewed: Allergy & Precautions, NPO status , Patient's Chart, lab work & pertinent test results  Airway Mallampati: II  TM Distance: >3 FB Neck ROM: Full    Dental  (+) Edentulous Upper, Edentulous Lower   Pulmonary neg pulmonary ROS, former smoker,    Pulmonary exam normal breath sounds clear to auscultation       Cardiovascular hypertension, Pt. on medications Normal cardiovascular exam Rhythm:Regular Rate:Normal  HLD   Neuro/Psych  Headaches, PSYCHIATRIC DISORDERS Depression    GI/Hepatic Neg liver ROS, GERD  Controlled,  Endo/Other  diabetes, Type 2, Oral Hypoglycemic AgentsHypothyroidism   Renal/GU negative Renal ROS  negative genitourinary   Musculoskeletal negative musculoskeletal ROS (+)   Abdominal   Peds  Hematology negative hematology ROS (+)   Anesthesia Other Findings Bladder CA  Reproductive/Obstetrics                            Anesthesia Physical Anesthesia Plan  ASA: II  Anesthesia Plan: General   Post-op Pain Management:    Induction: Intravenous  PONV Risk Score and Plan: 2 and Ondansetron and Dexamethasone  Airway Management Planned: LMA  Additional Equipment:   Intra-op Plan:   Post-operative Plan: Extubation in OR  Informed Consent: I have reviewed the patients History and Physical, chart, labs and discussed the procedure including the risks, benefits and alternatives for the proposed anesthesia with the patient or authorized representative who has indicated his/her understanding and acceptance.     Dental advisory given  Plan Discussed with: CRNA  Anesthesia Plan Comments:         Anesthesia Quick Evaluation

## 2020-07-17 NOTE — Anesthesia Postprocedure Evaluation (Signed)
Anesthesia Post Note  Patient: Keith [name]  Procedure(s) Performed: TRANSURETHRAL RESECTION OF BLADDER TUMOR (TURBT) (N/A Bladder)     Patient location during evaluation: PACU Anesthesia Type: General Level of consciousness: awake and alert Pain management: pain level controlled Vital Signs Assessment: post-procedure vital signs reviewed and stable Respiratory status: spontaneous breathing, nonlabored ventilation, respiratory function stable and patient connected to nasal cannula oxygen Cardiovascular status: blood pressure returned to baseline and stable Postop Assessment: no apparent nausea or vomiting Anesthetic complications: no   No complications documented.  Last Vitals:  Vitals:   07/17/20 1100 07/17/20 1115  BP: 133/72 (!) 149/70  Pulse: (!) 52 (!) 58  Resp: (!) 9 14  Temp:    SpO2: 100% 100%    Last Pain:  Vitals:   07/17/20 1115  TempSrc:   PainSc: 0-No pain                 Kendall Arnell L Donta Fuster

## 2020-07-17 NOTE — Transfer of Care (Addendum)
Immediate Anesthesia Transfer of Care Note  Patient: [name]  Procedure(s) Performed: TRANSURETHRAL RESECTION OF BLADDER TUMOR (TURBT) (N/A Bladder)  Patient Location: PACU  Anesthesia Type:General  Level of Consciousness: awake, alert  and oriented  Airway & Oxygen Therapy: Patient Spontanous Breathing and Patient connected to nasal cannula oxygen  Post-op Assessment: Report given to RN and Post -op Vital signs reviewed and stable  Post vital signs: Reviewed and stable  Last Vitals:  Vitals Value Taken Time  BP    Temp    Pulse 59 07/17/20 1019  Resp 6 07/17/20 1019  SpO2 98 % 07/17/20 1019  Vitals shown include unvalidated device data.  Last Pain:  Vitals:   07/17/20 0817  PainSc: 0-No pain      Patients Stated Pain Goal: 4 (10/71/25 2479)  Complications: No complications documented.

## 2020-07-17 NOTE — Discharge Instructions (Signed)
  Post Anesthesia Home Care Instructions  Activity: Get plenty of rest for the remainder of the day. A responsible adult should stay with you for 24 hours following the procedure.  For the next 24 hours, DO NOT: -Drive a car -Paediatric nurse -Drink alcoholic beverages -Take any medication unless instructed by your physician -Make any legal decisions or sign important papers.  Meals: Start with liquid foods such as gelatin or soup. Progress to regular foods as tolerated. Avoid greasy, spicy, heavy foods. If nausea and/or vomiting occur, drink only clear liquids until the nausea and/or vomiting subsides. Call your physician if vomiting continues.  Special Instructions/Symptoms: Your throat may feel dry or sore from the anesthesia or the breathing tube placed in your throat during surgery. If this causes discomfort, gargle with warm salt water. The discomfort should disappear within 24 hours.  If you had a scopolamine patch placed behind your ear for the management of post- operative nausea and/or vomiting:  1. The medication in the patch is effective for 72 hours, after which it should be removed.  Wrap patch in a tissue and discard in the trash. Wash hands thoroughly with soap and water. 2. You may remove the patch earlier than 72 hours if you experience unpleasant side effects which may include dry mouth, dizziness or visual disturbances. 3. Avoid touching the patch. Wash your hands with soap and water after contact with the patch.   1. You may see some blood in the urine and may have some burning with urination for 48-72 hours. You also may notice that you have to urinate more frequently or urgently after your procedure which is normal.  2. You should call should you develop an inability urinate, fever > 101, persistent nausea and vomiting that prevents you from eating or drinking to stay hydrated.  3. If you have a catheter, you will be taught how to take care of the catheter by the  nursing staff prior to discharge from the hospital.  You may periodically feel a strong urge to void with the catheter in place.  This is a bladder spasm and most often can occur when having a bowel movement or moving around. It is typically self-limited and usually will stop after a few minutes.  You may use some Vaseline or Neosporin around the tip of the catheter to reduce friction at the tip of the penis. You may also see some blood in the urine.  A very small amount of blood can make the urine look quite red.  As long as the catheter is draining well, there usually is not a problem.  However, if the catheter is not draining well and is bloody, you should call the office [phone]) to notify us. OK to take catheter out on Tuesday morning

## 2020-07-18 ENCOUNTER — Encounter (HOSPITAL_BASED_OUTPATIENT_CLINIC_OR_DEPARTMENT_OTHER): Payer: Self-pay | Admitting: Urology

## 2020-07-19 LAB — SURGICAL PATHOLOGY

## 2020-08-23 DIAGNOSIS — H2512 Age-related nuclear cataract, left eye: Secondary | ICD-10-CM | POA: Diagnosis not present

## 2020-08-23 DIAGNOSIS — Z961 Presence of intraocular lens: Secondary | ICD-10-CM | POA: Diagnosis not present

## 2020-08-23 DIAGNOSIS — H40021 Open angle with borderline findings, high risk, right eye: Secondary | ICD-10-CM | POA: Diagnosis not present

## 2020-09-05 DIAGNOSIS — M4726 Other spondylosis with radiculopathy, lumbar region: Secondary | ICD-10-CM | POA: Diagnosis not present

## 2020-09-05 DIAGNOSIS — M4157 Other secondary scoliosis, lumbosacral region: Secondary | ICD-10-CM | POA: Diagnosis not present

## 2020-09-06 ENCOUNTER — Telehealth: Payer: Self-pay | Admitting: Oncology

## 2020-09-06 NOTE — Telephone Encounter (Signed)
Received a new pt referral from Dr. Diona Fanti at [facility] for bladder cancer. Keith [name] has been cld and scheduled to see Dr. Alen Blew on 2/1 at 2pm. Pt aware to arrive 20 minutes early.

## 2020-09-12 ENCOUNTER — Inpatient Hospital Stay: Payer: Medicare HMO | Attending: Oncology | Admitting: Oncology

## 2020-09-12 ENCOUNTER — Other Ambulatory Visit: Payer: Self-pay

## 2020-09-12 VITALS — BP 125/63 | HR 71 | Temp 97.8°F | Resp 18 | Ht 70.0 in | Wt 196.9 lb

## 2020-09-12 DIAGNOSIS — C679 Malignant neoplasm of bladder, unspecified: Secondary | ICD-10-CM | POA: Insufficient documentation

## 2020-09-12 DIAGNOSIS — E119 Type 2 diabetes mellitus without complications: Secondary | ICD-10-CM | POA: Diagnosis not present

## 2020-09-12 DIAGNOSIS — Z7189 Other specified counseling: Secondary | ICD-10-CM | POA: Diagnosis not present

## 2020-09-12 DIAGNOSIS — Z1379 Encounter for other screening for genetic and chromosomal anomalies: Secondary | ICD-10-CM | POA: Insufficient documentation

## 2020-09-12 DIAGNOSIS — Z87891 Personal history of nicotine dependence: Secondary | ICD-10-CM | POA: Diagnosis not present

## 2020-09-12 DIAGNOSIS — Z2989 Encounter for other specified prophylactic measures: Secondary | ICD-10-CM | POA: Insufficient documentation

## 2020-09-12 DIAGNOSIS — F329 Major depressive disorder, single episode, unspecified: Secondary | ICD-10-CM | POA: Diagnosis not present

## 2020-09-12 DIAGNOSIS — C670 Malignant neoplasm of trigone of bladder: Secondary | ICD-10-CM | POA: Diagnosis not present

## 2020-09-12 DIAGNOSIS — Z5111 Encounter for antineoplastic chemotherapy: Secondary | ICD-10-CM | POA: Insufficient documentation

## 2020-09-12 NOTE — Progress Notes (Signed)
Bladder - No Medical Intervention - Off Treatment.  Patient Characteristics: Pre-Cystectomy or Nonsurgical Candidate (Clinical Staging), High-Grade cTa, cN0 or cTis/cT1, cN0, No Prior Intravesical Therapy Therapeutic Status: Pre-Cystectomy or Nonsurgical Candidate (Clinical Staging) AJCC M Category: cM0 AJCC 8 Stage Grouping: I AJCC T Category: cT1 AJCC N Category: cN0

## 2020-09-12 NOTE — Progress Notes (Signed)
Reason for the request:    Bladder cancer  HPI: I was asked by Dr. Diona Fanti  to evaluate Keith [name] for bladder cancer.  He is a 80 year old man with history of diabetes, depression as well as history of bladder cancer.  He presented with gross hematuria in the setting of tobacco use and found to have left bladder neck lesion in 2017.  TURBT at that time showed a high-grade urothelial carcinoma without muscle invasion.  He had received BCG treatment at that time.  He had a recurrent later that year in December 2017 with a high-grade papillary noninvasive tumor at that time.  He had repeat BCG treatment that was discontinued because of febrile illness.  He remained on active surveillance without any evidence of relapse with negative cytology and cystoscopy in 2018, 19.  In September 2021 he had a cystoscopy and showed a 3 cm lesion in the left ureteral office and stent was placed with pathology revealed high-grade urothelial carcinoma without any muscle invasion.  Repeat cystoscopy in December 2021 was completed then reresection of the old area of the tumor and replacement of a double-J stent.  Final pathology on December 6 showed high-grade urothelial carcinoma with invasion into the muscularis propria.  Muscularis propria was present and not involved.  Based on these findings he was referred to me for evaluation.  Clinically, he reports urinary frequency but no hematuria or dysuria.  He denies any constitutional symptoms weakness fatigue tiredness.  He does not report any headaches, blurry vision, syncope or seizures. Does not report any fevers, chills or sweats.  Does not report any cough, wheezing or hemoptysis.  Does not report any chest pain, palpitation, orthopnea or leg edema.  Does not report any nausea, vomiting or abdominal pain.  Does not report any constipation or diarrhea.  Does not report any skeletal complaints.    Does not report frequency, urgency or hematuria.  Does not report any skin  rashes or lesions. Does not report any heat or cold intolerance.  Does not report any lymphadenopathy or petechiae.  Does not report any anxiety or depression.  Remaining review of systems is negative.    Past Medical History:  Diagnosis Date  . Bladder cancer ([place])   . Blind right eye   . BPH (benign prostatic hypertrophy)   . Cataracts, both eyes   . Depression   . DM type 2 (diabetes mellitus, type 2) ([place])   . DOE (dyspnea on exertion)    with heavy exertion  . Full dentures   . GERD (gastroesophageal reflux disease)   . Glaucoma no peripheral or side vision   right eye blind  . Glaucoma, right eye   . HOH (hard of hearing)   . Hyperlipidemia   . Hypertension   . Hypothyroidism   . Local recurrence of cancer of urinary bladder [facility]) urologsit-  dr Diona Fanti   1st dx 01-29-2016--  now recurrent 05-31-2016  . Lower extremity edema 07/11/2020   right swells more than left goes down at night after sleeping  . Lower urinary tract symptoms (LUTS) 2019  . Migraine   . Peripheral neuropathy    middle finger left hand  . Pneumonia 2019  . Skin abnormality    base of spine looks like pimple has had since 07-01-2020, no drainage area intact  . Wears glasses   :  Past Surgical History:  Procedure Laterality Date  . ANTERIOR CERVICAL DECOMP/DISCECTOMY FUSION  08-15-2000   C5 -- C6  . ANTERIOR  CERVICAL DECOMP/DISCECTOMY FUSION  03-04-2008   C6 -- C7  . areas removed from back at spine  2021 jan and aug 2021  . CYSTOSCOPY W/ RETROGRADES Left 05/11/2020   Procedure: Cystoscopy with left retrograde ureteropyelogram, resection of probable bladder tumor and left trigonal/ureteral orifice region, 3 cm in size, fluoroscopic interpretation, left ureteroscopy, placement of 6 French by 26 cm contour double-J stent without tether;  Surgeon: Franchot Gallo, MD;  Location: [facility];  Service: Urology;  Laterality: Left;  . CYSTOSCOPY WITH BIOPSY N/A 01/29/2016   Procedure:  CYSTOSCOPY WITH BIOPSY;  Surgeon: Franchot Gallo, MD;  Location: [facility];  Service: Urology;  Laterality: N/A;  . CYSTOSCOPY WITH BIOPSY N/A 05/31/2016   Procedure: CYSTOSCOPY WITH BIOPSY;  Surgeon: Franchot Gallo, MD;  Location: [facility];  Service: Urology;  Laterality: N/A;  . [place]  . [place] and 2000  . lower back  06/01/2019, 02-10-2020  . nose cancer  areas removed  2019, 2021   x 2 / 3/ 2021 small area removed from nose  . surgery for sacral fracture  40 yrs ago  . TRANSURETHRAL RESECTION OF BLADDER TUMOR N/A 07/17/2020   Procedure: TRANSURETHRAL RESECTION OF BLADDER TUMOR (TURBT);  Surgeon: Franchot Gallo, MD;  Location: [facility];  Service: Urology;  Laterality: N/A;  :   Current Outpatient Medications:  .  acetaminophen (TYLENOL) 500 MG tablet, Take 1,000 mg by mouth every 8 (eight) hours as needed for moderate pain., Disp: , Rfl:  .  amLODipine (NORVASC) 10 MG tablet, Take 10 mg by mouth at bedtime. , Disp: , Rfl:  .  Aspirin-Salicylamide-Caffeine (ARTHRITIS STRENGTH BC POWDER PO), Take 1 packet by mouth daily as needed (pain)., Disp: , Rfl:  .  atorvastatin (LIPITOR) 20 MG tablet, Take 20 mg by mouth at bedtime. , Disp: , Rfl:  .  calcium carbonate (TUMS - DOSED IN MG ELEMENTAL CALCIUM) 500 MG chewable tablet, Chew 1 tablet by mouth as needed for indigestion or heartburn., Disp: , Rfl:  .  carteolol (OCUPRESS) 1 % ophthalmic solution, Place 1 drop into the right eye every morning., Disp: , Rfl:  .  cetirizine (ZYRTEC) 10 MG tablet, Take 10 mg by mouth at bedtime., Disp: , Rfl:  .  clotrimazole-betamethasone (LOTRISONE) lotion, Apply topically as needed., Disp: , Rfl:  .  fluticasone (FLONASE) 50 MCG/ACT nasal spray, Place 2 sprays into both nostrils daily as needed for allergies. , Disp: , Rfl:  .  gabapentin (NEURONTIN) 600 MG tablet, Take 600 mg by mouth 2 (two) times  daily. , Disp: , Rfl:  .  glimepiride (AMARYL) 1 MG tablet, Take 1 mg by mouth daily with breakfast. 1 mg in the morning,, Disp: , Rfl:  .  levothyroxine (SYNTHROID, LEVOTHROID) 100 MCG tablet, Take 100 mcg by mouth daily before breakfast. , Disp: , Rfl:  .  losartan (COZAAR) 100 MG tablet, Take 100 mg by mouth at bedtime. , Disp: , Rfl:  .  meloxicam (MOBIC) 15 MG tablet, Take 15 mg by mouth daily., Disp: , Rfl:  .  Misc Natural Products (COMPLETE PROSTATE HEALTH PO), Take 1 tablet by mouth 2 (two) times daily., Disp: , Rfl:  .  OVER THE COUNTER MEDICATION, Vitamin d 500 units daily, Disp: , Rfl:  .  sitaGLIPtin (JANUVIA) 100 MG tablet, Take 100 mg by mouth every morning., Disp: , Rfl:  .  tamsulosin (FLOMAX)  0.4 MG CAPS capsule, Take 0.4 mg by mouth every morning. , Disp: , Rfl:  .  tiZANidine (ZANAFLEX) 4 MG tablet, Take 1 tablet (4 mg total) by mouth every 6 (six) hours as needed for muscle spasms., Disp: 60 tablet, Rfl: 0 .  vitamin B-12 (CYANOCOBALAMIN) 1000 MCG tablet, Take 1,000 mcg by mouth daily., Disp: , Rfl: :  No Known Allergies:  Family History  Problem Relation Age of Onset  . Heart disease Mother   . Heart disease Father   :  Social History   Socioeconomic History  . Marital status: Married    Spouse name: Not on file  . Number of children: Not on file  . Years of education: Not on file  . Highest education level: Not on file  Occupational History  . Not on file  Tobacco Use  . Smoking status: Former Smoker    Packs/day: 1.00    Years: 50.00    Pack years: 50.00    Types: Cigarettes    Quit date: 06/27/2004    Years since quitting: 16.2  . Smokeless tobacco: Never Used  Vaping Use  . Vaping Use: Never used  Substance and Sexual Activity  . Alcohol use: Yes    Alcohol/week: 0.0 standard drinks    Comment: occ moonshine garles throat with  . Drug use: No  . Sexual activity: Not on file  Other Topics Concern  . Not on file  Social History Narrative  .  Not on file   Social Determinants of Health   Financial Resource Strain: Not on file  Food Insecurity: Not on file  Transportation Needs: Not on file  Physical Activity: Not on file  Stress: Not on file  Social Connections: Not on file  Intimate Partner Violence: Not on file  :  Pertinent items are noted in HPI.  Exam: Blood pressure 125/63, pulse 71, temperature 97.8 F (36.6 C), temperature source Tympanic, resp. rate 18, height 5\' 10"  (1.778 m), weight 196 lb 14.4 oz (89.3 kg), SpO2 98 %.  ECOG 0  General appearance: alert and cooperative appeared without distress. Head: atraumatic without any abnormalities. Eyes: conjunctivae/corneas clear. PERRL.  Sclera anicteric. Throat: lips, mucosa, and tongue normal; without oral thrush or ulcers. Resp: clear to auscultation bilaterally without rhonchi, wheezes or dullness to percussion. Cardio: regular rate and rhythm, S1, S2 normal, no murmur, click, rub or gallop GI: soft, non-tender; bowel sounds normal; no masses,  no organomegaly Skin: Skin color, texture, turgor normal. No rashes or lesions Lymph nodes: Cervical, supraclavicular, and axillary nodes normal. Neurologic: Grossly normal without any motor, sensory or deep tendon reflexes. Musculoskeletal: No joint deformity or effusion.    Assessment and Plan:     80 year old with:  1.  Bladder cancer diagnosed in 2017.  He was found to have high-grade urothelial carcinoma without muscle invasion.  He was treated with BCG on 2 separate occasions with the second treatment in 2017 was discontinued due to febrile illness.  He had relapsed disease documented in December 2021.  The natural course of this disease was discussed at this time and treatment options were reviewed.  He has developed BCG refractory disease and his options include radical cystectomy, intravesicular chemotherapy or intravenous immunotherapy.  Risks and benefits of all these options were discussed.  Complication  associated with intravesicular gemcitabine were discussed.  Complications that include dysuria, fatigue and hematuria among others.  Intravenous immunotherapy was also reviewed but could be used as a salvage option in the future.  Radical cystectomy will be the most definitive and radical approach at this time.  After discussion today, he is agreeable to proceed with gemcitabine intravesicular installment.  He will receive it weekly for 6 weeks without any labs.  Education class will be arranged for before the start of therapy.  2.  Follow-up: Will be in the near future to start treatment.  60  minutes were dedicated to this visit. The time was spent on reviewing pathology results, discussing treatment options, and answering questions regarding future plan.     A copy of this consult has been forwarded to the requesting physician.

## 2020-09-15 ENCOUNTER — Telehealth: Payer: Self-pay | Admitting: Oncology

## 2020-09-15 NOTE — Telephone Encounter (Signed)
Scheduled per 02/01 los, patient has been called and notified.  

## 2020-09-19 NOTE — Progress Notes (Signed)
Pharmacist Chemotherapy Monitoring - Initial Assessment    Anticipated start date: 09/26/20   Regimen:  . Are orders appropriate based on the patient's diagnosis, regimen, and cycle? Yes . Does the plan date match the patient's scheduled date? Yes . Is the sequencing of drugs appropriate? Yes . Are the premedications appropriate for the patient's regimen? Yes . Prior Authorization for treatment is: Pending o If applicable, is the correct biosimilar selected based on the patient's insurance? not applicable  Organ Function and Labs: Marland Kitchen Are dose adjustments needed based on the patient's renal function, hepatic function, or hematologic function? Yes . Are appropriate labs ordered prior to the start of patient's treatment? Yes . Other organ system assessment, if indicated: N/A . The following baseline labs, if indicated, have been ordered: N/A  Dose Assessment: . Are the drug doses appropriate? Yes . Are the following correct: o Drug concentrations Yes o IV fluid compatible with drug Yes o Administration routes Yes o Timing of therapy Yes . If applicable, does the patient have documented access for treatment and/or plans for port-a-cath placement? not applicable . If applicable, have lifetime cumulative doses been properly documented and assessed? not applicable Lifetime Dose Tracking  No doses have been documented on this patient for the following tracked chemicals: Doxorubicin, Epirubicin, Idarubicin, Daunorubicin, Mitoxantrone, Bleomycin, Oxaliplatin, Carboplatin, Liposomal Doxorubicin  o   Toxicity Monitoring/Prevention: . The patient has the following take home antiemetics prescribed: Prochlorperazine . The patient has the following take home medications prescribed: N/A . Medication allergies and previous infusion related reactions, if applicable, have been reviewed and addressed. Yes . The patient's current medication list has been assessed for drug-drug interactions with their  chemotherapy regimen. no significant drug-drug interactions were identified on review.  Order Review: . Are the treatment plan orders signed? Yes . Is the patient scheduled to see a provider prior to their treatment? Yes  I verify that I have reviewed each item in the above checklist and answered each question accordingly.  @RPHNAME @

## 2020-09-20 ENCOUNTER — Other Ambulatory Visit: Payer: Self-pay

## 2020-09-20 ENCOUNTER — Inpatient Hospital Stay: Payer: Medicare HMO

## 2020-09-26 ENCOUNTER — Inpatient Hospital Stay: Payer: Medicare HMO

## 2020-09-26 ENCOUNTER — Other Ambulatory Visit: Payer: Self-pay

## 2020-09-26 VITALS — BP 133/67 | HR 71 | Temp 98.1°F | Resp 16 | Wt 193.0 lb

## 2020-09-26 DIAGNOSIS — C670 Malignant neoplasm of trigone of bladder: Secondary | ICD-10-CM

## 2020-09-26 DIAGNOSIS — Z5111 Encounter for antineoplastic chemotherapy: Secondary | ICD-10-CM | POA: Diagnosis not present

## 2020-09-26 MED ORDER — PROCHLORPERAZINE MALEATE 10 MG PO TABS
ORAL_TABLET | ORAL | Status: AC
  Filled 2020-09-26: qty 1

## 2020-09-26 MED ORDER — PROCHLORPERAZINE MALEATE 10 MG PO TABS
10.0000 mg | ORAL_TABLET | Freq: Once | ORAL | Status: AC
  Administered 2020-09-26: 10 mg via ORAL

## 2020-09-26 MED ORDER — LIDOCAINE HCL URETHRAL/MUCOSAL 2 % EX GEL
1.0000 "application " | Freq: Once | CUTANEOUS | Status: AC
  Administered 2020-09-26: 1 via URETHRAL
  Filled 2020-09-26: qty 1

## 2020-09-26 MED ORDER — GEMCITABINE CHEMO FOR BLADDER INSTILLATION 2000 MG
2000.0000 mg | Freq: Once | INTRAVENOUS | Status: AC
  Administered 2020-09-26: 2000 mg via INTRAVESICAL
  Filled 2020-09-26: qty 52.6

## 2020-09-26 NOTE — Patient Instructions (Signed)
[name] Discharge Instructions for Patients Receiving Chemotherapy  Today you received the following chemotherapy agent: Gemcitabine  To help prevent nausea and vomiting after your treatment, we encourage you to take your nausea medication as directed by your MD.   If you develop nausea and vomiting that is not controlled by your nausea medication, call the clinic.   BELOW ARE SYMPTOMS THAT SHOULD BE REPORTED IMMEDIATELY:  *FEVER GREATER THAN 100.5 F  *CHILLS WITH OR WITHOUT FEVER  NAUSEA AND VOMITING THAT IS NOT CONTROLLED WITH YOUR NAUSEA MEDICATION  *UNUSUAL SHORTNESS OF BREATH  *UNUSUAL BRUISING OR BLEEDING  TENDERNESS IN MOUTH AND THROAT WITH OR WITHOUT PRESENCE OF ULCERS  *URINARY PROBLEMS  *BOWEL PROBLEMS  UNUSUAL RASH Items with * indicate a potential emergency and should be followed up as soon as possible.  Feel free to call the clinic should you have any questions or concerns. The clinic phone number is [phone].  Please show the [place] at check-in to the Emergency Department and triage nurse.  Gemcitabine injection What is this medicine? GEMCITABINE (jem SYE ta been) is a chemotherapy drug. This medicine is used to treat many types of cancer like breast cancer, lung cancer, pancreatic cancer, and ovarian cancer. This medicine may be used for other purposes; ask your health care provider or pharmacist if you have questions. COMMON BRAND NAME(S): Gemzar, Infugem What should I tell my health care provider before I take this medicine? They need to know if you have any of these conditions:  blood disorders  infection  kidney disease  liver disease  lung or breathing disease, like asthma  recent or ongoing radiation therapy  an unusual or allergic reaction to gemcitabine, other chemotherapy, other medicines, foods, dyes, or preservatives  pregnant or trying to get pregnant  breast-feeding How should I use this  medicine? This drug is given as an infusion into a vein. It is administered in a hospital or clinic by a specially trained health care professional. Talk to your pediatrician regarding the use of this medicine in children. Special care may be needed. Overdosage: If you think you have taken too much of this medicine contact a poison control center or emergency room at once. NOTE: This medicine is only for you. Do not share this medicine with others. What if I miss a dose? It is important not to miss your dose. Call your doctor or health care professional if you are unable to keep an appointment. What may interact with this medicine?  medicines to increase blood counts like filgrastim, pegfilgrastim, sargramostim  some other chemotherapy drugs like cisplatin  vaccines Talk to your doctor or health care professional before taking any of these medicines:  acetaminophen  aspirin  ibuprofen  ketoprofen  naproxen This list may not describe all possible interactions. Give your health care provider a list of all the medicines, herbs, non-prescription drugs, or dietary supplements you use. Also tell them if you smoke, drink alcohol, or use illegal drugs. Some items may interact with your medicine. What should I watch for while using this medicine? Visit your doctor for checks on your progress. This drug may make you feel generally unwell. This is not uncommon, as chemotherapy can affect healthy cells as well as cancer cells. Report any side effects. Continue your course of treatment even though you feel ill unless your doctor tells you to stop. In some cases, you may be given additional medicines to help with side effects. Follow all directions for  their use. Call your doctor or health care professional for advice if you get a fever, chills or sore throat, or other symptoms of a cold or flu. Do not treat yourself. This drug decreases your body's ability to fight infections. Try to avoid being  around people who are sick. This medicine may increase your risk to bruise or bleed. Call your doctor or health care professional if you notice any unusual bleeding. Be careful brushing and flossing your teeth or using a toothpick because you may get an infection or bleed more easily. If you have any dental work done, tell your dentist you are receiving this medicine. Avoid taking products that contain aspirin, acetaminophen, ibuprofen, naproxen, or ketoprofen unless instructed by your doctor. These medicines may hide a fever. Do not become pregnant while taking this medicine or for 6 months after stopping it. Women should inform their doctor if they wish to become pregnant or think they might be pregnant. Men should not father a child while taking this medicine and for 3 months after stopping it. There is a potential for serious side effects to an unborn child. Talk to your health care professional or pharmacist for more information. Do not breast-feed an infant while taking this medicine or for at least 1 week after stopping it. Men should inform their doctors if they wish to father a child. This medicine may lower sperm counts. Talk with your doctor or health care professional if you are concerned about your fertility. What side effects may I notice from receiving this medicine? Side effects that you should report to your doctor or health care professional as soon as possible:  allergic reactions like skin rash, itching or hives, swelling of the face, lips, or tongue  breathing problems  pain, redness, or irritation at site where injected  signs and symptoms of a dangerous change in heartbeat or heart rhythm like chest pain; dizziness; fast or irregular heartbeat; palpitations; feeling faint or lightheaded, falls; breathing problems  signs of decreased platelets or bleeding - bruising, pinpoint red spots on the skin, black, tarry stools, blood in the urine  signs of decreased red blood cells -  unusually weak or tired, feeling faint or lightheaded, falls  signs of infection - fever or chills, cough, sore throat, pain or difficulty passing urine  signs and symptoms of kidney injury like trouble passing urine or change in the amount of urine  signs and symptoms of liver injury like dark yellow or brown urine; general ill feeling or flu-like symptoms; light-colored stools; loss of appetite; nausea; right upper belly pain; unusually weak or tired; yellowing of the eyes or skin  swelling of ankles, feet, hands Side effects that usually do not require medical attention (report to your doctor or health care professional if they continue or are bothersome):  constipation  diarrhea  hair loss  loss of appetite  nausea  rash  vomiting This list may not describe all possible side effects. Call your doctor for medical advice about side effects. You may report side effects to FDA at 1-800-FDA-1088. Where should I keep my medicine? This drug is given in a hospital or clinic and will not be stored at home. NOTE: This sheet is a summary. It may not cover all possible information. If you have questions about this medicine, talk to your doctor, pharmacist, or health care provider.  2021 Elsevier/Gold Standard (2017-10-22 18:06:11)

## 2020-09-26 NOTE — Progress Notes (Signed)
While administering gemcitabine, patient began c/o urge to urinate. Explained to patient this sensation is normal; however, noted fluid leaking around urinary catheter. Tension applied to catheter to prevent further leaking. Leaking continued in spite of tension. Patient verbalizing abdominal pressure with urinary urgency. Dr. Alen Blew aware of situation. Dr. Alen Blew advised to discontinue treatment. Per the discussion, we will use an 18 Fr. Urinary catheter for next treatment and will change oxybutynin to a scheduled premedication.

## 2020-09-27 ENCOUNTER — Encounter: Payer: Self-pay | Admitting: Oncology

## 2020-09-27 ENCOUNTER — Telehealth: Payer: Self-pay | Admitting: *Deleted

## 2020-09-27 NOTE — Progress Notes (Signed)
Called pt to introduce myself as his Arboriculturist, discuss copay assistance and the [employer].  He gave me consent to apply in his behalf so I applied to the Patient Access Network and he was approved for $2,500 from11/18/21to2/15/23 for Gemzar.  I also informed him of the [employer] and went over what it covers.  He would like to apply and based on his verbal income he was approved for the $1000 [employer].  I will give him an expense sheet andmy card on 10/03/20 for any questions or concerns he may have in the future.

## 2020-09-27 NOTE — Telephone Encounter (Signed)
-----   Message from Sinda Du, RN sent at 09/26/2020  2:22 PM EST ----- Regarding: Dr. Alen Blew - 1st intravesical chemo f/u 1st chemo f/u

## 2020-09-27 NOTE — Telephone Encounter (Signed)
Called pt to see how he did with his treatment yesterday.  He reports maybe getting half of Gemcitabine due to leakage around catheter.  He denies any problems while at home & states he knows how to reach [place] if needed.

## 2020-09-28 DIAGNOSIS — L02212 Cutaneous abscess of back [any part, except buttock]: Secondary | ICD-10-CM | POA: Diagnosis not present

## 2020-09-28 DIAGNOSIS — L723 Sebaceous cyst: Secondary | ICD-10-CM | POA: Diagnosis not present

## 2020-09-28 DIAGNOSIS — L089 Local infection of the skin and subcutaneous tissue, unspecified: Secondary | ICD-10-CM | POA: Diagnosis not present

## 2020-10-03 ENCOUNTER — Other Ambulatory Visit: Payer: Self-pay

## 2020-10-03 ENCOUNTER — Other Ambulatory Visit: Payer: Self-pay | Admitting: Oncology

## 2020-10-03 ENCOUNTER — Other Ambulatory Visit: Payer: Self-pay | Admitting: Medical

## 2020-10-03 ENCOUNTER — Inpatient Hospital Stay: Payer: Medicare HMO

## 2020-10-03 VITALS — BP 145/69 | HR 71 | Temp 97.7°F | Resp 18

## 2020-10-03 DIAGNOSIS — C670 Malignant neoplasm of trigone of bladder: Secondary | ICD-10-CM | POA: Diagnosis not present

## 2020-10-03 DIAGNOSIS — Z5111 Encounter for antineoplastic chemotherapy: Secondary | ICD-10-CM | POA: Diagnosis not present

## 2020-10-03 MED ORDER — PROCHLORPERAZINE MALEATE 10 MG PO TABS
ORAL_TABLET | ORAL | Status: AC
  Filled 2020-10-03: qty 1

## 2020-10-03 MED ORDER — PROCHLORPERAZINE MALEATE 10 MG PO TABS
10.0000 mg | ORAL_TABLET | Freq: Once | ORAL | Status: AC
  Administered 2020-10-03: 10 mg via ORAL

## 2020-10-03 MED ORDER — OXYBUTYNIN CHLORIDE 5 MG PO TABS: 5.0000 mg | ORAL_TABLET | Freq: Three times a day (TID) | ORAL | 0 refills | Status: DC

## 2020-10-03 MED ORDER — LIDOCAINE HCL URETHRAL/MUCOSAL 2 % EX GEL
1.0000 "application " | Freq: Once | CUTANEOUS | Status: AC
  Administered 2020-10-03: 1 via URETHRAL
  Filled 2020-10-03: qty 1

## 2020-10-03 MED ORDER — GEMCITABINE CHEMO FOR BLADDER INSTILLATION 2000 MG
2000.0000 mg | Freq: Once | INTRAVENOUS | Status: AC
  Administered 2020-10-03: 2000 mg via INTRAVESICAL
  Filled 2020-10-03: qty 52.6

## 2020-10-03 MED ORDER — OXYBUTYNIN CHLORIDE 5 MG PO TABS
5.0000 mg | ORAL_TABLET | Freq: Once | ORAL | Status: AC
  Administered 2020-10-03: 5 mg via ORAL
  Filled 2020-10-03: qty 1

## 2020-10-03 NOTE — Progress Notes (Signed)
1 syringe filled with Gemzar was effectively administered via the foley catheter. Upon administration of the 2nd syringe the patient c/o discomfort and the medication began to immediately leak out so the injection was stopped. Contacted Dr. Alen Blew and received order to just administer 1 syringe as we were not able to administer 2nd due to leaking. Upon observation of the patient it was noticed that the catheter began to consistently leak out from around the foley catheter insertion site. Spoke with Dr. Alen Blew regarding the above information and received an order to release the clamp and DC the foley catheter once finished draining and DC the patient.

## 2020-10-03 NOTE — Progress Notes (Signed)
300 cc's clear yellow urine returned upon 18 fr foley insertion.

## 2020-10-03 NOTE — Telephone Encounter (Signed)
Contacted patient and made him aware that Ditropan has been called in to his pharmacy. Informed him to take 1 tablet 2 hours before his next intravesicular treatment. Explained that the goal is to relax his bladder enough to be able to instill the whole amount of chemo. Patient verbalized understanding and had no other questions or concerns.

## 2020-10-04 DIAGNOSIS — H2512 Age-related nuclear cataract, left eye: Secondary | ICD-10-CM | POA: Diagnosis not present

## 2020-10-04 DIAGNOSIS — Z961 Presence of intraocular lens: Secondary | ICD-10-CM | POA: Diagnosis not present

## 2020-10-04 DIAGNOSIS — H40021 Open angle with borderline findings, high risk, right eye: Secondary | ICD-10-CM | POA: Diagnosis not present

## 2020-10-06 ENCOUNTER — Telehealth: Payer: Self-pay

## 2020-10-06 NOTE — Telephone Encounter (Signed)
Patient's wife called office with questions about patient's ditropan prescription. Contacted Sandi Mealy, PA for clarification. Per Lucianne Lei, [place], patient can take ditropan 1-3 times daily 2 days before chemo, on day of chemo, and 2 days after chemo.  Informed wife of instructions. Wife verbalized understanding and will inform patient.  Instructed wife to call office if patient needed any further clarification or had any other questions.

## 2020-10-10 ENCOUNTER — Other Ambulatory Visit: Payer: Self-pay

## 2020-10-10 ENCOUNTER — Inpatient Hospital Stay: Payer: Medicare HMO | Attending: Oncology

## 2020-10-10 VITALS — BP 129/67 | HR 66 | Temp 97.9°F | Resp 16

## 2020-10-10 DIAGNOSIS — Z5111 Encounter for antineoplastic chemotherapy: Secondary | ICD-10-CM | POA: Diagnosis not present

## 2020-10-10 DIAGNOSIS — C670 Malignant neoplasm of trigone of bladder: Secondary | ICD-10-CM | POA: Diagnosis not present

## 2020-10-10 MED ORDER — LIDOCAINE HCL URETHRAL/MUCOSAL 2 % EX GEL
1.0000 "application " | Freq: Once | CUTANEOUS | Status: AC
  Administered 2020-10-10: 1 via URETHRAL
  Filled 2020-10-10: qty 1

## 2020-10-10 MED ORDER — GEMCITABINE CHEMO FOR BLADDER INSTILLATION 2000 MG
2000.0000 mg | Freq: Once | INTRAVENOUS | Status: AC
  Administered 2020-10-10: 2000 mg via INTRAVESICAL
  Filled 2020-10-10: qty 52.6

## 2020-10-10 MED ORDER — PROCHLORPERAZINE MALEATE 10 MG PO TABS
10.0000 mg | ORAL_TABLET | Freq: Once | ORAL | Status: AC
  Administered 2020-10-10: 10 mg via ORAL

## 2020-10-10 MED ORDER — OXYBUTYNIN CHLORIDE 5 MG PO TABS
5.0000 mg | ORAL_TABLET | Freq: Once | ORAL | Status: AC
  Administered 2020-10-10: 5 mg via ORAL
  Filled 2020-10-10: qty 1

## 2020-10-10 MED ORDER — PROCHLORPERAZINE MALEATE 10 MG PO TABS
ORAL_TABLET | ORAL | Status: AC
  Filled 2020-10-10: qty 1

## 2020-10-10 NOTE — Progress Notes (Signed)
Pt. had intravesicular chemotherapy today with Gemcitabine. Pt. took Ditropan 5mg  tablet at 7:00 am and again at 12:00 pm. Pt. had a bladder spasm/leakage with scant amount of urine noted and Ditopan given again during treatment (pt. declined [place] B&O sup.) Pt. rotated supine, right side, prone, and left side. Tolerated treatment well.

## 2020-10-10 NOTE — Patient Instructions (Signed)
[place] Discharge Instructions for Patients Receiving Chemotherapy  Today you received the following chemotherapy agent: Gemcitabine (Gemzar) Intravesicular  To help prevent nausea and vomiting after your treatment, we encourage you to take your nausea medication as directed by your MD.   If you develop nausea and vomiting that is not controlled by your nausea medication, call the clinic.   BELOW ARE SYMPTOMS THAT SHOULD BE REPORTED IMMEDIATELY:  *FEVER GREATER THAN 100.5 F  *CHILLS WITH OR WITHOUT FEVER  NAUSEA AND VOMITING THAT IS NOT CONTROLLED WITH YOUR NAUSEA MEDICATION  *UNUSUAL SHORTNESS OF BREATH  *UNUSUAL BRUISING OR BLEEDING  TENDERNESS IN MOUTH AND THROAT WITH OR WITHOUT PRESENCE OF ULCERS  *URINARY PROBLEMS  *BOWEL PROBLEMS  UNUSUAL RASH Items with * indicate a potential emergency and should be followed up as soon as possible.  Feel free to call the clinic should you have any questions or concerns. The clinic phone number is [phone].  Please show the [place] at check-in to the Emergency Department and triage nurse.

## 2020-10-17 ENCOUNTER — Inpatient Hospital Stay: Payer: Medicare HMO

## 2020-10-17 ENCOUNTER — Telehealth: Payer: Self-pay | Admitting: Oncology

## 2020-10-17 ENCOUNTER — Other Ambulatory Visit: Payer: Self-pay

## 2020-10-17 VITALS — BP 135/62 | HR 63 | Temp 98.5°F | Resp 18 | Wt 194.5 lb

## 2020-10-17 DIAGNOSIS — C670 Malignant neoplasm of trigone of bladder: Secondary | ICD-10-CM | POA: Diagnosis not present

## 2020-10-17 DIAGNOSIS — Z5111 Encounter for antineoplastic chemotherapy: Secondary | ICD-10-CM | POA: Diagnosis not present

## 2020-10-17 MED ORDER — GEMCITABINE CHEMO FOR BLADDER INSTILLATION 2000 MG
2000.0000 mg | Freq: Once | INTRAVENOUS | Status: AC
  Administered 2020-10-17: 2000 mg via INTRAVESICAL
  Filled 2020-10-17: qty 52.6

## 2020-10-17 MED ORDER — OXYBUTYNIN CHLORIDE 5 MG PO TABS
5.0000 mg | ORAL_TABLET | Freq: Once | ORAL | Status: AC
  Administered 2020-10-17: 5 mg via ORAL
  Filled 2020-10-17: qty 1

## 2020-10-17 MED ORDER — PROCHLORPERAZINE MALEATE 10 MG PO TABS
ORAL_TABLET | ORAL | Status: AC
  Filled 2020-10-17: qty 1

## 2020-10-17 MED ORDER — PROCHLORPERAZINE MALEATE 10 MG PO TABS
10.0000 mg | ORAL_TABLET | Freq: Once | ORAL | Status: AC
  Administered 2020-10-17: 10 mg via ORAL

## 2020-10-17 MED ORDER — LIDOCAINE HCL URETHRAL/MUCOSAL 2 % EX GEL
1.0000 "application " | Freq: Once | CUTANEOUS | Status: AC
  Administered 2020-10-17: 1 via URETHRAL
  Filled 2020-10-17: qty 1

## 2020-10-17 NOTE — Progress Notes (Signed)
Pt had intravesicular gemzar tx today.  Pt took ditropan twice at home this morning before arrival, also received ditropan here as premedication.  Pt leaked moderate amount of fluid around catheter immediately after gemzar administered.  Pt remained supine for 15 minutes, when turned to R side, very large amount of urine leaked onto absorbent pads.  Dr. Alen Blew informed, catheter drained (1,000 mls) and DC'd.

## 2020-10-17 NOTE — Telephone Encounter (Signed)
Release: [phone]  Faxed ov note to [facility] fax# [phone]

## 2020-10-17 NOTE — Patient Instructions (Signed)
[place] Discharge Instructions for Patients Receiving Chemotherapy  Today you received the following chemotherapy agent: Gemcitabine (Gemzar) Intravesicular  To help prevent nausea and vomiting after your treatment, we encourage you to take your nausea medication as directed by your MD.   If you develop nausea and vomiting that is not controlled by your nausea medication, call the clinic.   BELOW ARE SYMPTOMS THAT SHOULD BE REPORTED IMMEDIATELY:  *FEVER GREATER THAN 100.5 F  *CHILLS WITH OR WITHOUT FEVER  NAUSEA AND VOMITING THAT IS NOT CONTROLLED WITH YOUR NAUSEA MEDICATION  *UNUSUAL SHORTNESS OF BREATH  *UNUSUAL BRUISING OR BLEEDING  TENDERNESS IN MOUTH AND THROAT WITH OR WITHOUT PRESENCE OF ULCERS  *URINARY PROBLEMS  *BOWEL PROBLEMS  UNUSUAL RASH Items with * indicate a potential emergency and should be followed up as soon as possible.  Feel free to call the clinic should you have any questions or concerns. The clinic phone number is [phone].  Please show the [place] at check-in to the Emergency Department and triage nurse.

## 2020-10-24 ENCOUNTER — Inpatient Hospital Stay: Payer: Medicare HMO

## 2020-10-24 ENCOUNTER — Other Ambulatory Visit: Payer: Self-pay

## 2020-10-24 VITALS — BP 134/67 | HR 97 | Temp 98.2°F | Resp 16

## 2020-10-24 DIAGNOSIS — Z5111 Encounter for antineoplastic chemotherapy: Secondary | ICD-10-CM | POA: Diagnosis not present

## 2020-10-24 DIAGNOSIS — C670 Malignant neoplasm of trigone of bladder: Secondary | ICD-10-CM

## 2020-10-24 MED ORDER — PROCHLORPERAZINE MALEATE 10 MG PO TABS
ORAL_TABLET | ORAL | Status: AC
  Filled 2020-10-24: qty 1

## 2020-10-24 MED ORDER — GEMCITABINE CHEMO FOR BLADDER INSTILLATION 2000 MG
2000.0000 mg | Freq: Once | INTRAVENOUS | Status: AC
  Administered 2020-10-24: 2000 mg via INTRAVESICAL
  Filled 2020-10-24: qty 52.6

## 2020-10-24 MED ORDER — BELLADONNA ALKALOIDS-OPIUM 16.2-60 MG RE SUPP
1.0000 | Freq: Once | RECTAL | Status: AC | PRN
  Administered 2020-10-24: 1 via RECTAL
  Filled 2020-10-24: qty 1

## 2020-10-24 MED ORDER — GEMCITABINE CHEMO FOR BLADDER INSTILLATION 2000 MG
2000.0000 mg | Freq: Once | INTRAVENOUS | Status: DC
  Filled 2020-10-24: qty 52.6

## 2020-10-24 MED ORDER — PROCHLORPERAZINE MALEATE 10 MG PO TABS
10.0000 mg | ORAL_TABLET | Freq: Once | ORAL | Status: AC
Start: 2020-10-24 — End: 2020-10-24
  Administered 2020-10-24: 10 mg via ORAL

## 2020-10-24 NOTE — Patient Instructions (Signed)
[facility] Discharge Instructions for Patients Receiving Chemotherapy  Today you received the following chemotherapy agents: gemcitabine.  To help prevent nausea and vomiting after your treatment, we encourage you to take your nausea medication as directed.   If you develop nausea and vomiting that is not controlled by your nausea medication, call the clinic.   BELOW ARE SYMPTOMS THAT SHOULD BE REPORTED IMMEDIATELY:  *FEVER GREATER THAN 100.5 F  *CHILLS WITH OR WITHOUT FEVER  NAUSEA AND VOMITING THAT IS NOT CONTROLLED WITH YOUR NAUSEA MEDICATION  *UNUSUAL SHORTNESS OF BREATH  *UNUSUAL BRUISING OR BLEEDING  TENDERNESS IN MOUTH AND THROAT WITH OR WITHOUT PRESENCE OF ULCERS  *URINARY PROBLEMS  *BOWEL PROBLEMS  UNUSUAL RASH Items with * indicate a potential emergency and should be followed up as soon as possible.  Feel free to call the clinic should you have any questions or concerns. The clinic phone number is [phone].  Please show the CHEMO ALERT CARD at check-in to the Emergency Department and triage nurse.   

## 2020-10-24 NOTE — Progress Notes (Signed)
Gemcitabine bladder instillation initiated with minor bladder spasms and leakage. Quickly resolved and treatment continued. Patient rotated every 15 mins (supine, RLR, LLR, and prone) with minimal urethral leaking. Patient voiced no complaints. At the end of shift, report given to Vito Berger, RN.

## 2020-10-31 ENCOUNTER — Other Ambulatory Visit: Payer: Self-pay

## 2020-10-31 ENCOUNTER — Inpatient Hospital Stay: Payer: Medicare HMO | Admitting: Oncology

## 2020-10-31 ENCOUNTER — Inpatient Hospital Stay: Payer: Medicare HMO

## 2020-10-31 VITALS — BP 130/60 | HR 71 | Temp 97.8°F | Resp 17 | Ht 70.0 in | Wt 194.1 lb

## 2020-10-31 DIAGNOSIS — C670 Malignant neoplasm of trigone of bladder: Secondary | ICD-10-CM | POA: Diagnosis not present

## 2020-10-31 DIAGNOSIS — Z5111 Encounter for antineoplastic chemotherapy: Secondary | ICD-10-CM | POA: Diagnosis not present

## 2020-10-31 MED ORDER — PROCHLORPERAZINE MALEATE 10 MG PO TABS
10.0000 mg | ORAL_TABLET | Freq: Once | ORAL | Status: AC
  Administered 2020-10-31: 10 mg via ORAL

## 2020-10-31 MED ORDER — PROCHLORPERAZINE MALEATE 10 MG PO TABS
ORAL_TABLET | ORAL | Status: AC
  Filled 2020-10-31: qty 1

## 2020-10-31 MED ORDER — BELLADONNA ALKALOIDS-OPIUM 16.2-60 MG RE SUPP
1.0000 | Freq: Once | RECTAL | Status: AC | PRN
  Administered 2020-10-31: 1 via RECTAL
  Filled 2020-10-31: qty 1

## 2020-10-31 MED ORDER — GEMCITABINE CHEMO FOR BLADDER INSTILLATION 2000 MG
2000.0000 mg | Freq: Once | INTRAVENOUS | Status: AC
  Administered 2020-10-31: 2000 mg via INTRAVESICAL
  Filled 2020-10-31: qty 52.6

## 2020-10-31 MED ORDER — OXYBUTYNIN CHLORIDE 5 MG PO TABS
5.0000 mg | ORAL_TABLET | Freq: Once | ORAL | Status: DC
  Filled 2020-10-31: qty 1

## 2020-10-31 MED ORDER — LIDOCAINE HCL URETHRAL/MUCOSAL 2 % EX GEL
1.0000 "application " | Freq: Once | CUTANEOUS | Status: AC
  Administered 2020-10-31: 1 via URETHRAL
  Filled 2020-10-31: qty 1

## 2020-10-31 NOTE — Patient Instructions (Signed)
[facility] Discharge Instructions for Patients Receiving Chemotherapy  Today you received the following chemotherapy agents: intravesicular gemcitabine.  To help prevent nausea and vomiting after your treatment, we encourage you to take your nausea medication as directed.   If you develop nausea and vomiting that is not controlled by your nausea medication, call the clinic.   BELOW ARE SYMPTOMS THAT SHOULD BE REPORTED IMMEDIATELY:  *FEVER GREATER THAN 100.5 F  *CHILLS WITH OR WITHOUT FEVER  NAUSEA AND VOMITING THAT IS NOT CONTROLLED WITH YOUR NAUSEA MEDICATION  *UNUSUAL SHORTNESS OF BREATH  *UNUSUAL BRUISING OR BLEEDING  TENDERNESS IN MOUTH AND THROAT WITH OR WITHOUT PRESENCE OF ULCERS  *URINARY PROBLEMS  *BOWEL PROBLEMS  UNUSUAL RASH Items with * indicate a potential emergency and should be followed up as soon as possible.  Feel free to call the clinic should you have any questions or concerns. The clinic phone number is [phone].  Please show the CHEMO ALERT CARD at check-in to the Emergency Department and triage nurse.   

## 2020-10-31 NOTE — Progress Notes (Signed)
Per Dr Alen Blew, give B&O suppository prior to foley insertion to reduce risk of bladder spasms/leakage during chemo administration.  Intravesicular gemcitabine instilled via 18 french foley catheter. Patient rotated Q15 minutes -- supine, left side, prone, right side -- with minimal leakage. No bladder spasms noted. After 60 minutes, catheter unclamped. 1200 mL drained. Foley removed. Patient discharged in stable condition.

## 2020-10-31 NOTE — Progress Notes (Signed)
Hematology and Oncology Follow Up Visit  Keith [name] [id number] Jan 01, 1941 80 y.o. 10/31/2020 8:03 AM Leonette Nutting, DO   Principle Diagnosis: 80 year old with bladder cancer diagnosed in 2017.  He was found to have high-grade urothelial carcinoma without muscle invasion and developed and subsequently BCG refractory disease in December 2021.   Prior Therapy: He had received BCG treatment  In 2017 had repeat BCG treatment that was discontinued because of febrile illness.      In September 2021 he had a cystoscopy and showed a 3 cm lesion in the left ureteral office and stent was placed with pathology revealed high-grade urothelial carcinoma without any muscle invasion.  Repeat cystoscopy in December 2021 was completed then reresection of the old area of the tumor and replacement of a double-J stent.  Final pathology on December 6 showed high-grade urothelial carcinoma with muscularis propria was present and not involved.    Current therapy:  Intravesicular gemcitabine with a total of 2000 mg weekly started in February 2022.  Today is week 6 of therapy.  Interim History: Mr. Patlan returns today for a follow-up visit.  Since the last visit, he is completing intravesicular gemcitabine without major complications.  He suffered to have bladder spasms the first 2 weeks of therapy but has been using Ditropan prior to his appointment which have helped retain gemcitabine.  Medically, he reports no nausea vomiting or abdominal pain.  He denies any dysuria or hematuria.     Medications: I have reviewed the patient's current medications.  Current Outpatient Medications  Medication Sig Dispense Refill  . acetaminophen (TYLENOL) 500 MG tablet Take 1,000 mg by mouth every 8 (eight) hours as needed for moderate pain.    Marland Kitchen amLODipine (NORVASC) 10 MG tablet Take 10 mg by mouth at bedtime.     . Aspirin-Salicylamide-Caffeine (ARTHRITIS STRENGTH BC POWDER PO) Take 1 packet by mouth  daily as needed (pain).    Marland Kitchen atorvastatin (LIPITOR) 20 MG tablet Take 20 mg by mouth at bedtime.     . calcium carbonate (TUMS - DOSED IN MG ELEMENTAL CALCIUM) 500 MG chewable tablet Chew 1 tablet by mouth as needed for indigestion or heartburn.    . carteolol (OCUPRESS) 1 % ophthalmic solution Place 1 drop into the right eye every morning.    . cetirizine (ZYRTEC) 10 MG tablet Take 10 mg by mouth at bedtime.    . clotrimazole-betamethasone (LOTRISONE) lotion Apply topically as needed.    . fluticasone (FLONASE) 50 MCG/ACT nasal spray Place 2 sprays into both nostrils daily as needed for allergies.     Marland Kitchen gabapentin (NEURONTIN) 600 MG tablet Take 600 mg by mouth 2 (two) times daily.    Marland Kitchen glimepiride (AMARYL) 1 MG tablet Take 1 mg by mouth daily with breakfast. 1 mg in the morning,    . levothyroxine (SYNTHROID, LEVOTHROID) 100 MCG tablet Take 100 mcg by mouth daily before breakfast.     . losartan (COZAAR) 100 MG tablet Take 100 mg by mouth at bedtime.     . meloxicam (MOBIC) 15 MG tablet Take 15 mg by mouth daily.    . Misc Natural Products (COMPLETE PROSTATE HEALTH PO) Take 1 tablet by mouth 2 (two) times daily.    Marland Kitchen OVER THE COUNTER MEDICATION Vitamin d 500 units daily    . oxybutynin (DITROPAN) 5 MG tablet Take 1 tablet (5 mg total) by mouth 3 (three) times daily. 30 tablet 0  . sitaGLIPtin (JANUVIA) 100 MG tablet Take 100 mg  by mouth every morning.    . tamsulosin (FLOMAX) 0.4 MG CAPS capsule Take 0.4 mg by mouth every morning.     Marland Kitchen tiZANidine (ZANAFLEX) 4 MG tablet Take 1 tablet (4 mg total) by mouth every 6 (six) hours as needed for muscle spasms. 60 tablet 0  . vitamin B-12 (CYANOCOBALAMIN) 1000 MCG tablet Take 1,000 mcg by mouth daily.     No current facility-administered medications for this visit.     Allergies: No Known Allergies    Physical Exam: Blood pressure 130/60, pulse 71, temperature 97.8 F (36.6 C), temperature source Tympanic, resp. rate 17, height 5\' 10"  (1.778  m), weight 194 lb 1.6 oz (88 kg), SpO2 98 %.   ECOG: 0   General appearance: Comfortable appearing without any discomfort Head: Normocephalic without any trauma Oropharynx: Mucous membranes are moist and pink without any thrush or ulcers. Eyes: Pupils are equal and round reactive to light. Lymph nodes: No cervical, supraclavicular, inguinal or axillary lymphadenopathy.   Heart:regular rate and rhythm.  S1 and S2 without leg edema. Lung: Clear without any rhonchi or wheezes.  No dullness to percussion. Abdomin: Soft, nontender, nondistended with good bowel sounds.  No hepatosplenomegaly. Musculoskeletal: No joint deformity or effusion.  Full range of motion noted. Neurological: No deficits noted on motor, sensory and deep tendon reflex exam. Skin: No petechial rash or dryness.  Appeared moist.      Lab Results: Lab Results  Component Value Date   WBC 8.0 02/12/2020   HGB 13.6 07/17/2020   HCT 40.0 07/17/2020   MCV 91.4 02/12/2020   PLT 147 (L) 02/12/2020     Chemistry      Component Value Date/Time   NA 140 07/17/2020 0817   K 4.0 07/17/2020 0817   CL 104 07/17/2020 0817   CO2 23 05/11/2020 1300   BUN 15 07/17/2020 0817   CREATININE 0.70 07/17/2020 0817      Component Value Date/Time   CALCIUM 9.5 05/11/2020 1300   ALKPHOS 40 02/12/2020 1930   AST 23 02/12/2020 1930   ALT 19 02/12/2020 1930   BILITOT 0.9 02/12/2020 1930        Impression and Plan:  80 year old with:  1.    BCG refractory high-grade urothelial carcinoma without muscle invasion with initial diagnosis in 2017.  He has T1N0 disease.    He is currently receiving intravesicular gemcitabine with a few complications initially with bladder spasms with improvement subsequently after the second week of therapy.  Risks and benefits of proceeding with therapy today were discussed at this time.  Potential complications including cystitis, abdominal pain among others were reviewed.  Alternative treatment  options would include systemic Pembrolizumab versus salvage cystectomy.  He is agreeable to proceed and will follow up with Dr. Diona Fanti for repeat cystoscopy.   2.  Follow-up:  Will be as needed in the future.  30  minutes were spent on this encounter.  Time was dedicated to reviewing his disease status, addressing complications related to therapy and future treatment options.   Zola Button, MD 3/22/20228:03 AM

## 2020-11-28 DIAGNOSIS — Z6828 Body mass index (BMI) 28.0-28.9, adult: Secondary | ICD-10-CM | POA: Diagnosis not present

## 2020-11-28 DIAGNOSIS — M4157 Other secondary scoliosis, lumbosacral region: Secondary | ICD-10-CM | POA: Diagnosis not present

## 2020-11-28 DIAGNOSIS — M4726 Other spondylosis with radiculopathy, lumbar region: Secondary | ICD-10-CM | POA: Diagnosis not present

## 2020-11-28 DIAGNOSIS — I10 Essential (primary) hypertension: Secondary | ICD-10-CM | POA: Diagnosis not present

## 2020-12-01 DIAGNOSIS — E785 Hyperlipidemia, unspecified: Secondary | ICD-10-CM | POA: Diagnosis not present

## 2020-12-01 DIAGNOSIS — I10 Essential (primary) hypertension: Secondary | ICD-10-CM | POA: Diagnosis not present

## 2020-12-01 DIAGNOSIS — E1169 Type 2 diabetes mellitus with other specified complication: Secondary | ICD-10-CM | POA: Diagnosis not present

## 2020-12-01 DIAGNOSIS — E039 Hypothyroidism, unspecified: Secondary | ICD-10-CM | POA: Diagnosis not present

## 2020-12-04 DIAGNOSIS — L723 Sebaceous cyst: Secondary | ICD-10-CM | POA: Diagnosis not present

## 2020-12-04 DIAGNOSIS — L089 Local infection of the skin and subcutaneous tissue, unspecified: Secondary | ICD-10-CM | POA: Diagnosis not present

## 2020-12-20 DIAGNOSIS — C678 Malignant neoplasm of overlapping sites of bladder: Secondary | ICD-10-CM | POA: Diagnosis not present

## 2021-01-03 DIAGNOSIS — L723 Sebaceous cyst: Secondary | ICD-10-CM | POA: Diagnosis not present

## 2021-01-03 DIAGNOSIS — L089 Local infection of the skin and subcutaneous tissue, unspecified: Secondary | ICD-10-CM | POA: Diagnosis not present

## 2021-01-25 DIAGNOSIS — Z8551 Personal history of malignant neoplasm of bladder: Secondary | ICD-10-CM | POA: Diagnosis not present

## 2021-01-25 DIAGNOSIS — G894 Chronic pain syndrome: Secondary | ICD-10-CM | POA: Diagnosis not present

## 2021-01-25 DIAGNOSIS — I10 Essential (primary) hypertension: Secondary | ICD-10-CM | POA: Diagnosis not present

## 2021-01-25 DIAGNOSIS — N401 Enlarged prostate with lower urinary tract symptoms: Secondary | ICD-10-CM | POA: Diagnosis not present

## 2021-01-25 DIAGNOSIS — C679 Malignant neoplasm of bladder, unspecified: Secondary | ICD-10-CM | POA: Diagnosis not present

## 2021-01-25 DIAGNOSIS — E1169 Type 2 diabetes mellitus with other specified complication: Secondary | ICD-10-CM | POA: Diagnosis not present

## 2021-01-25 DIAGNOSIS — E039 Hypothyroidism, unspecified: Secondary | ICD-10-CM | POA: Diagnosis not present

## 2021-01-25 DIAGNOSIS — E785 Hyperlipidemia, unspecified: Secondary | ICD-10-CM | POA: Diagnosis not present

## 2021-02-05 DIAGNOSIS — H2512 Age-related nuclear cataract, left eye: Secondary | ICD-10-CM | POA: Diagnosis not present

## 2021-02-05 DIAGNOSIS — H40021 Open angle with borderline findings, high risk, right eye: Secondary | ICD-10-CM | POA: Diagnosis not present

## 2021-02-05 DIAGNOSIS — Z961 Presence of intraocular lens: Secondary | ICD-10-CM | POA: Diagnosis not present

## 2021-03-06 DIAGNOSIS — M542 Cervicalgia: Secondary | ICD-10-CM | POA: Diagnosis not present

## 2021-03-06 DIAGNOSIS — I10 Essential (primary) hypertension: Secondary | ICD-10-CM | POA: Diagnosis not present

## 2021-03-06 DIAGNOSIS — Z6827 Body mass index (BMI) 27.0-27.9, adult: Secondary | ICD-10-CM | POA: Diagnosis not present

## 2021-03-06 DIAGNOSIS — M5126 Other intervertebral disc displacement, lumbar region: Secondary | ICD-10-CM | POA: Diagnosis not present

## 2021-03-06 DIAGNOSIS — M4726 Other spondylosis with radiculopathy, lumbar region: Secondary | ICD-10-CM | POA: Diagnosis not present

## 2021-03-14 DIAGNOSIS — M5126 Other intervertebral disc displacement, lumbar region: Secondary | ICD-10-CM | POA: Diagnosis not present

## 2021-03-14 DIAGNOSIS — M545 Low back pain, unspecified: Secondary | ICD-10-CM | POA: Diagnosis not present

## 2021-03-19 DIAGNOSIS — I10 Essential (primary) hypertension: Secondary | ICD-10-CM | POA: Diagnosis not present

## 2021-03-19 DIAGNOSIS — M5126 Other intervertebral disc displacement, lumbar region: Secondary | ICD-10-CM | POA: Diagnosis not present

## 2021-03-19 DIAGNOSIS — Z6827 Body mass index (BMI) 27.0-27.9, adult: Secondary | ICD-10-CM | POA: Diagnosis not present

## 2021-04-25 DIAGNOSIS — C678 Malignant neoplasm of overlapping sites of bladder: Secondary | ICD-10-CM | POA: Diagnosis not present

## 2021-06-11 DIAGNOSIS — E1169 Type 2 diabetes mellitus with other specified complication: Secondary | ICD-10-CM | POA: Diagnosis not present

## 2021-06-11 DIAGNOSIS — E039 Hypothyroidism, unspecified: Secondary | ICD-10-CM | POA: Diagnosis not present

## 2021-06-11 DIAGNOSIS — E785 Hyperlipidemia, unspecified: Secondary | ICD-10-CM | POA: Diagnosis not present

## 2021-06-11 DIAGNOSIS — I10 Essential (primary) hypertension: Secondary | ICD-10-CM | POA: Diagnosis not present

## 2021-06-20 DIAGNOSIS — E1169 Type 2 diabetes mellitus with other specified complication: Secondary | ICD-10-CM | POA: Diagnosis not present

## 2021-06-20 DIAGNOSIS — E785 Hyperlipidemia, unspecified: Secondary | ICD-10-CM | POA: Diagnosis not present

## 2021-06-20 DIAGNOSIS — E039 Hypothyroidism, unspecified: Secondary | ICD-10-CM | POA: Diagnosis not present

## 2021-06-20 DIAGNOSIS — Z125 Encounter for screening for malignant neoplasm of prostate: Secondary | ICD-10-CM | POA: Diagnosis not present

## 2021-06-20 DIAGNOSIS — R739 Hyperglycemia, unspecified: Secondary | ICD-10-CM | POA: Diagnosis not present

## 2021-06-20 DIAGNOSIS — I10 Essential (primary) hypertension: Secondary | ICD-10-CM | POA: Diagnosis not present

## 2021-06-27 DIAGNOSIS — R739 Hyperglycemia, unspecified: Secondary | ICD-10-CM | POA: Diagnosis not present

## 2021-06-27 DIAGNOSIS — E039 Hypothyroidism, unspecified: Secondary | ICD-10-CM | POA: Diagnosis not present

## 2021-06-27 DIAGNOSIS — D649 Anemia, unspecified: Secondary | ICD-10-CM | POA: Diagnosis not present

## 2021-06-27 DIAGNOSIS — E1169 Type 2 diabetes mellitus with other specified complication: Secondary | ICD-10-CM | POA: Diagnosis not present

## 2021-06-27 DIAGNOSIS — I69819 Unspecified symptoms and signs involving cognitive functions following other cerebrovascular disease: Secondary | ICD-10-CM | POA: Diagnosis not present

## 2021-06-27 DIAGNOSIS — Z1331 Encounter for screening for depression: Secondary | ICD-10-CM | POA: Diagnosis not present

## 2021-06-27 DIAGNOSIS — I10 Essential (primary) hypertension: Secondary | ICD-10-CM | POA: Diagnosis not present

## 2021-06-27 DIAGNOSIS — R82998 Other abnormal findings in urine: Secondary | ICD-10-CM | POA: Diagnosis not present

## 2021-06-27 DIAGNOSIS — N401 Enlarged prostate with lower urinary tract symptoms: Secondary | ICD-10-CM | POA: Diagnosis not present

## 2021-06-27 DIAGNOSIS — G629 Polyneuropathy, unspecified: Secondary | ICD-10-CM | POA: Diagnosis not present

## 2021-06-27 DIAGNOSIS — E785 Hyperlipidemia, unspecified: Secondary | ICD-10-CM | POA: Diagnosis not present

## 2021-06-27 DIAGNOSIS — G894 Chronic pain syndrome: Secondary | ICD-10-CM | POA: Diagnosis not present

## 2021-06-27 DIAGNOSIS — R5383 Other fatigue: Secondary | ICD-10-CM | POA: Diagnosis not present

## 2021-06-27 DIAGNOSIS — Z0000 Encounter for general adult medical examination without abnormal findings: Secondary | ICD-10-CM | POA: Diagnosis not present

## 2021-06-27 DIAGNOSIS — Z8551 Personal history of malignant neoplasm of bladder: Secondary | ICD-10-CM | POA: Diagnosis not present

## 2021-06-27 DIAGNOSIS — Z23 Encounter for immunization: Secondary | ICD-10-CM | POA: Diagnosis not present

## 2021-06-29 ENCOUNTER — Other Ambulatory Visit: Payer: Self-pay | Admitting: Internal Medicine

## 2021-06-29 DIAGNOSIS — G3184 Mild cognitive impairment, so stated: Secondary | ICD-10-CM

## 2021-07-11 DIAGNOSIS — I10 Essential (primary) hypertension: Secondary | ICD-10-CM | POA: Diagnosis not present

## 2021-07-11 DIAGNOSIS — E1169 Type 2 diabetes mellitus with other specified complication: Secondary | ICD-10-CM | POA: Diagnosis not present

## 2021-07-11 DIAGNOSIS — E039 Hypothyroidism, unspecified: Secondary | ICD-10-CM | POA: Diagnosis not present

## 2021-07-11 DIAGNOSIS — E785 Hyperlipidemia, unspecified: Secondary | ICD-10-CM | POA: Diagnosis not present

## 2021-07-25 ENCOUNTER — Ambulatory Visit
Admission: RE | Admit: 2021-07-25 | Discharge: 2021-07-25 | Disposition: A | Discharge status: Home / Self Care | Payer: Medicare HMO | Source: Ambulatory Visit | Attending: Internal Medicine | Admitting: Internal Medicine

## 2021-07-25 ENCOUNTER — Other Ambulatory Visit: Payer: Self-pay

## 2021-07-25 DIAGNOSIS — J3489 Other specified disorders of nose and nasal sinuses: Secondary | ICD-10-CM | POA: Diagnosis not present

## 2021-07-25 DIAGNOSIS — I6782 Cerebral ischemia: Secondary | ICD-10-CM | POA: Diagnosis not present

## 2021-07-25 DIAGNOSIS — G3184 Mild cognitive impairment, so stated: Secondary | ICD-10-CM | POA: Diagnosis not present

## 2021-07-25 DIAGNOSIS — I639 Cerebral infarction, unspecified: Secondary | ICD-10-CM | POA: Diagnosis not present

## 2021-07-25 MED ORDER — GADOBENATE DIMEGLUMINE 529 MG/ML IV SOLN
15.0000 mL | Freq: Once | INTRAVENOUS | Status: AC | PRN
  Administered 2021-07-25: 16:00:00 15 mL via INTRAVENOUS

## 2021-08-10 DIAGNOSIS — E785 Hyperlipidemia, unspecified: Secondary | ICD-10-CM | POA: Diagnosis not present

## 2021-08-10 DIAGNOSIS — E1169 Type 2 diabetes mellitus with other specified complication: Secondary | ICD-10-CM | POA: Diagnosis not present

## 2021-08-10 DIAGNOSIS — I10 Essential (primary) hypertension: Secondary | ICD-10-CM | POA: Diagnosis not present

## 2021-08-10 DIAGNOSIS — E039 Hypothyroidism, unspecified: Secondary | ICD-10-CM | POA: Diagnosis not present

## 2021-08-13 DIAGNOSIS — H40021 Open angle with borderline findings, high risk, right eye: Secondary | ICD-10-CM | POA: Diagnosis not present

## 2021-08-13 DIAGNOSIS — H2512 Age-related nuclear cataract, left eye: Secondary | ICD-10-CM | POA: Diagnosis not present

## 2021-08-13 DIAGNOSIS — Z961 Presence of intraocular lens: Secondary | ICD-10-CM | POA: Diagnosis not present

## 2021-09-03 DIAGNOSIS — L720 Epidermal cyst: Secondary | ICD-10-CM | POA: Diagnosis not present

## 2021-09-09 DIAGNOSIS — I10 Essential (primary) hypertension: Secondary | ICD-10-CM | POA: Diagnosis not present

## 2021-09-09 DIAGNOSIS — E785 Hyperlipidemia, unspecified: Secondary | ICD-10-CM | POA: Diagnosis not present

## 2021-09-09 DIAGNOSIS — E1169 Type 2 diabetes mellitus with other specified complication: Secondary | ICD-10-CM | POA: Diagnosis not present

## 2021-09-09 DIAGNOSIS — E039 Hypothyroidism, unspecified: Secondary | ICD-10-CM | POA: Diagnosis not present

## 2021-10-04 DIAGNOSIS — C679 Malignant neoplasm of bladder, unspecified: Secondary | ICD-10-CM | POA: Diagnosis not present

## 2021-10-04 DIAGNOSIS — E039 Hypothyroidism, unspecified: Secondary | ICD-10-CM | POA: Diagnosis not present

## 2021-10-04 DIAGNOSIS — R5381 Other malaise: Secondary | ICD-10-CM | POA: Diagnosis not present

## 2021-10-04 DIAGNOSIS — N401 Enlarged prostate with lower urinary tract symptoms: Secondary | ICD-10-CM | POA: Diagnosis not present

## 2021-10-04 DIAGNOSIS — E1169 Type 2 diabetes mellitus with other specified complication: Secondary | ICD-10-CM | POA: Diagnosis not present

## 2021-10-04 DIAGNOSIS — I10 Essential (primary) hypertension: Secondary | ICD-10-CM | POA: Diagnosis not present

## 2021-10-04 DIAGNOSIS — G894 Chronic pain syndrome: Secondary | ICD-10-CM | POA: Diagnosis not present

## 2021-10-04 DIAGNOSIS — Z8551 Personal history of malignant neoplasm of bladder: Secondary | ICD-10-CM | POA: Diagnosis not present

## 2021-10-04 DIAGNOSIS — E785 Hyperlipidemia, unspecified: Secondary | ICD-10-CM | POA: Diagnosis not present

## 2021-10-09 DIAGNOSIS — E1169 Type 2 diabetes mellitus with other specified complication: Secondary | ICD-10-CM | POA: Diagnosis not present

## 2021-10-09 DIAGNOSIS — I10 Essential (primary) hypertension: Secondary | ICD-10-CM | POA: Diagnosis not present

## 2021-10-09 DIAGNOSIS — E039 Hypothyroidism, unspecified: Secondary | ICD-10-CM | POA: Diagnosis not present

## 2021-10-09 DIAGNOSIS — E785 Hyperlipidemia, unspecified: Secondary | ICD-10-CM | POA: Diagnosis not present

## 2021-10-24 DIAGNOSIS — C678 Malignant neoplasm of overlapping sites of bladder: Secondary | ICD-10-CM | POA: Diagnosis not present

## 2021-10-26 DIAGNOSIS — I10 Essential (primary) hypertension: Secondary | ICD-10-CM | POA: Diagnosis not present

## 2021-10-26 DIAGNOSIS — E1169 Type 2 diabetes mellitus with other specified complication: Secondary | ICD-10-CM | POA: Diagnosis not present

## 2021-11-01 DIAGNOSIS — I10 Essential (primary) hypertension: Secondary | ICD-10-CM | POA: Diagnosis not present

## 2021-11-01 DIAGNOSIS — E1169 Type 2 diabetes mellitus with other specified complication: Secondary | ICD-10-CM | POA: Diagnosis not present

## 2021-11-01 DIAGNOSIS — N401 Enlarged prostate with lower urinary tract symptoms: Secondary | ICD-10-CM | POA: Diagnosis not present

## 2021-12-10 DIAGNOSIS — H40021 Open angle with borderline findings, high risk, right eye: Secondary | ICD-10-CM | POA: Diagnosis not present

## 2021-12-10 DIAGNOSIS — H2512 Age-related nuclear cataract, left eye: Secondary | ICD-10-CM | POA: Diagnosis not present

## 2021-12-10 DIAGNOSIS — Z961 Presence of intraocular lens: Secondary | ICD-10-CM | POA: Diagnosis not present

## 2021-12-28 DIAGNOSIS — G894 Chronic pain syndrome: Secondary | ICD-10-CM | POA: Diagnosis not present

## 2021-12-28 DIAGNOSIS — N401 Enlarged prostate with lower urinary tract symptoms: Secondary | ICD-10-CM | POA: Diagnosis not present

## 2021-12-28 DIAGNOSIS — Z8551 Personal history of malignant neoplasm of bladder: Secondary | ICD-10-CM | POA: Diagnosis not present

## 2021-12-28 DIAGNOSIS — E785 Hyperlipidemia, unspecified: Secondary | ICD-10-CM | POA: Diagnosis not present

## 2021-12-28 DIAGNOSIS — I10 Essential (primary) hypertension: Secondary | ICD-10-CM | POA: Diagnosis not present

## 2021-12-28 DIAGNOSIS — E039 Hypothyroidism, unspecified: Secondary | ICD-10-CM | POA: Diagnosis not present

## 2021-12-28 DIAGNOSIS — C679 Malignant neoplasm of bladder, unspecified: Secondary | ICD-10-CM | POA: Diagnosis not present

## 2021-12-28 DIAGNOSIS — D692 Other nonthrombocytopenic purpura: Secondary | ICD-10-CM | POA: Diagnosis not present

## 2021-12-28 DIAGNOSIS — E1169 Type 2 diabetes mellitus with other specified complication: Secondary | ICD-10-CM | POA: Diagnosis not present

## 2022-01-09 DIAGNOSIS — E785 Hyperlipidemia, unspecified: Secondary | ICD-10-CM | POA: Diagnosis not present

## 2022-01-09 DIAGNOSIS — E1169 Type 2 diabetes mellitus with other specified complication: Secondary | ICD-10-CM | POA: Diagnosis not present

## 2022-01-09 DIAGNOSIS — E039 Hypothyroidism, unspecified: Secondary | ICD-10-CM | POA: Diagnosis not present

## 2022-01-09 DIAGNOSIS — I10 Essential (primary) hypertension: Secondary | ICD-10-CM | POA: Diagnosis not present

## 2022-01-15 ENCOUNTER — Other Ambulatory Visit: Payer: Self-pay | Admitting: Nurse Practitioner

## 2022-03-25 DIAGNOSIS — Z6826 Body mass index (BMI) 26.0-26.9, adult: Secondary | ICD-10-CM | POA: Diagnosis not present

## 2022-03-25 DIAGNOSIS — M4726 Other spondylosis with radiculopathy, lumbar region: Secondary | ICD-10-CM | POA: Diagnosis not present

## 2022-03-25 DIAGNOSIS — M542 Cervicalgia: Secondary | ICD-10-CM | POA: Diagnosis not present

## 2022-04-02 DIAGNOSIS — M48061 Spinal stenosis, lumbar region without neurogenic claudication: Secondary | ICD-10-CM | POA: Diagnosis not present

## 2022-04-02 DIAGNOSIS — M4726 Other spondylosis with radiculopathy, lumbar region: Secondary | ICD-10-CM | POA: Diagnosis not present

## 2022-04-02 DIAGNOSIS — M5136 Other intervertebral disc degeneration, lumbar region: Secondary | ICD-10-CM | POA: Diagnosis not present

## 2022-04-10 DIAGNOSIS — Z961 Presence of intraocular lens: Secondary | ICD-10-CM | POA: Diagnosis not present

## 2022-04-10 DIAGNOSIS — H2512 Age-related nuclear cataract, left eye: Secondary | ICD-10-CM | POA: Diagnosis not present

## 2022-04-10 DIAGNOSIS — H40021 Open angle with borderline findings, high risk, right eye: Secondary | ICD-10-CM | POA: Diagnosis not present

## 2022-04-16 DIAGNOSIS — M4726 Other spondylosis with radiculopathy, lumbar region: Secondary | ICD-10-CM | POA: Diagnosis not present

## 2022-04-16 DIAGNOSIS — M48062 Spinal stenosis, lumbar region with neurogenic claudication: Secondary | ICD-10-CM | POA: Diagnosis not present

## 2022-04-16 DIAGNOSIS — Z6827 Body mass index (BMI) 27.0-27.9, adult: Secondary | ICD-10-CM | POA: Diagnosis not present

## 2022-05-08 DIAGNOSIS — C678 Malignant neoplasm of overlapping sites of bladder: Secondary | ICD-10-CM | POA: Diagnosis not present

## 2022-05-08 DIAGNOSIS — C679 Malignant neoplasm of bladder, unspecified: Secondary | ICD-10-CM | POA: Diagnosis not present

## 2022-06-18 ENCOUNTER — Other Ambulatory Visit: Payer: Self-pay | Admitting: Urology

## 2022-06-18 NOTE — Progress Notes (Signed)
Sent message, via epic in basket, requesting orders in epic from surgeon.  

## 2022-06-19 NOTE — Progress Notes (Signed)
COVID Vaccine Completed:  Date of COVID positive in last 90 days:  PCP - Sueanne Margarita, DO Cardiologist -   Chest x-ray -  EKG -  Stress Test -  ECHO -  Cardiac Cath -  Pacemaker/ICD device last checked: Spinal Cord Stimulator:  Bowel Prep -   Sleep Study -  CPAP -   Fasting Blood Sugar -  Checks Blood Sugar _____ times a day  Last dose of GLP1 agonist-  N/A GLP1 instructions:  N/A   Last dose of SGLT-2 inhibitors-  N/A SGLT-2 instructions: N/A   Blood Thinner Instructions: Aspirin Instructions: Last Dose:  Activity level:  Can go up a flight of stairs and perform activities of daily living without stopping and without symptoms of chest pain or shortness of breath.  Able to exercise without symptoms  Unable to go up a flight of stairs without symptoms of     Anesthesia review:   Patient denies shortness of breath, fever, cough and chest pain at PAT appointment  Patient verbalized understanding of instructions that were given to them at the PAT appointment. Patient was also instructed that they will need to review over the PAT instructions again at home before surgery.

## 2022-06-19 NOTE — Patient Instructions (Signed)
SURGICAL WAITING ROOM VISITATION Patients having surgery or a procedure may have no more than 2 support people in the waiting area - these visitors may rotate.   Children under the age of 75 must have an adult with them who is not the patient. If the patient needs to stay at the hospital during part of their recovery, the visitor guidelines for inpatient rooms apply. Pre-op nurse will coordinate an appropriate time for 1 support person to accompany patient in pre-op.  This support person may not rotate.    Please refer to the [facility] website for the visitor guidelines for Inpatients (after your surgery is over and you are in a regular room).    Your procedure is scheduled on: 06/24/22   Report to [facility] Main Entrance    Report to admitting at 6:30 AM   Call this number if you have problems the morning of surgery [phone]   Do not eat food or drink liquids :After Midnight.          If you have questions, please contact your surgeon's office.  FOLLOW BOWEL PREP AND ANY ADDITIONAL PRE OP INSTRUCTIONS YOU RECEIVED FROM YOUR SURGEON'S OFFICE!!!     Oral Hygiene is also important to reduce your risk of infection.                                    Remember - BRUSH YOUR TEETH THE MORNING OF SURGERY WITH YOUR REGULAR TOOTHPASTE   Do NOT smoke after Midnight   Take these medicines the morning of surgery with A SIP OF WATER: Tylenol, Amlodipine, Zyrtec, Levothyroxine, Omeprazole, Tamsulosin   DO NOT TAKE ANY ORAL DIABETIC MEDICATIONS DAY OF YOUR SURGERY  How to Manage Your Diabetes Before and After Surgery  Why is it important to control my blood sugar before and after surgery? Improving blood sugar levels before and after surgery helps healing and can limit problems. A way of improving blood sugar control is eating a healthy diet by:  Eating less sugar and carbohydrates  Increasing activity/exercise  Talking with your doctor about reaching your blood sugar  goals High blood sugars (greater than 180 mg/dL) can raise your risk of infections and slow your recovery, so you will need to focus on controlling your diabetes during the weeks before surgery. Make sure that the doctor who takes care of your diabetes knows about your planned surgery including the date and location.  How do I manage my blood sugar before surgery? Check your blood sugar at least 4 times a day, starting 2 days before surgery, to make sure that the level is not too high or low. Check your blood sugar the morning of your surgery when you wake up and every 2 hours until you get to the Short Stay unit. If your blood sugar is less than 70 mg/dL, you will need to treat for low blood sugar: Do not take insulin. Treat a low blood sugar (less than 70 mg/dL) with  cup of clear juice (cranberry or apple), 4 glucose tablets, OR glucose gel. Recheck blood sugar in 15 minutes after treatment (to make sure it is greater than 70 mg/dL). If your blood sugar is not greater than 70 mg/dL on recheck, call [phone] for further instructions. Report your blood sugar to the short stay nurse when you get to Short Stay.  If you are admitted to the hospital after surgery: Your blood  sugar will be checked by the staff and you will probably be given insulin after surgery (instead of oral diabetes medicines) to make sure you have good blood sugar levels. The goal for blood sugar control after surgery is 80-180 mg/dL.   WHAT DO I DO ABOUT MY DIABETES MEDICATION?  Do not take oral diabetes medicines (pills) the morning of surgery.  THE DAY BEFORE SURGERY, take only morning dose of Glimepiride, no evening dose. Take Januvia as prescribed.    THE MORNING OF SURGERY, do not take Glimepiride or Januvia.  Reviewed and Endorsed by [facility] Patient Education Committee, August 2015                              You may not have any metal on your body including jewelry, and body piercing             Do  not wear lotions, powders, cologne, or deodorant              Men may shave face and neck.   Do not bring valuables to the hospital. [place].   Contacts, dentures or bridgework may not be worn into surgery.  DO NOT [place]. PHARMACY WILL DISPENSE MEDICATIONS LISTED ON YOUR MEDICATION LIST TO YOU DURING YOUR ADMISSION [place]!    Patients discharged on the day of surgery will not be allowed to drive home.  Someone NEEDS to stay with you for the first 24 hours after anesthesia.              Please read over the following fact sheets you were given: IF [place] [name]    If you received a COVID test during your pre-op visit  it is requested that you wear a mask when out in public, stay away from anyone that may not be feeling well and notify your surgeon if you develop symptoms. If you test positive for Covid or have been in contact with anyone that has tested positive in the last 10 days please notify you surgeon.    [place] - Preparing for Surgery Before surgery, you can play an important role.  Because skin is not sterile, your skin needs to be as free of germs as possible.  You can reduce the number of germs on your skin by washing with CHG (chlorahexidine gluconate) soap before surgery.  CHG is an antiseptic cleaner which kills germs and bonds with the skin to continue killing germs even after washing. Please DO NOT use if you have an allergy to CHG or antibacterial soaps.  If your skin becomes reddened/irritated stop using the CHG and inform your nurse when you arrive at Short Stay. Do not shave (including legs and underarms) for at least 48 hours prior to the first CHG shower.  You may shave your face/neck.  Please follow these instructions carefully:  1.  Shower with CHG Soap the night before surgery and the  morning of  surgery.  2.  If you choose to wash your hair, wash your hair first as usual with your normal  shampoo.  3.  After you shampoo, rinse your hair and body thoroughly to remove the shampoo.  4.  Use CHG as you would any other liquid soap.  You can apply chg directly to the skin and wash.  Gently with a scrungie or clean washcloth.  5.  Apply the CHG Soap to your body ONLY FROM THE NECK DOWN.   Do   not use on face/ open                           Wound or open sores. Avoid contact with eyes, ears mouth and   genitals (private parts).                       Wash face,  Genitals (private parts) with your normal soap.             6.  Wash thoroughly, paying special attention to the area where your    surgery  will be performed.  7.  Thoroughly rinse your body with warm water from the neck down.  8.  DO NOT shower/wash with your normal soap after using and rinsing off the CHG Soap.                9.  Pat yourself dry with a clean towel.            10.  Wear clean pajamas.            11.  Place clean sheets on your bed the night of your first shower and do not  sleep with pets. Day of Surgery : Do not apply any lotions/deodorants the morning of surgery.  Please wear clean clothes to the hospital/surgery center.  FAILURE TO FOLLOW THESE INSTRUCTIONS MAY RESULT IN THE CANCELLATION OF YOUR SURGERY  PATIENT SIGNATURE_________________________________  NURSE SIGNATURE__________________________________  ________________________________________________________________________

## 2022-06-20 ENCOUNTER — Encounter (HOSPITAL_COMMUNITY)
Admission: RE | Admit: 2022-06-20 | Discharge: 2022-06-20 | Disposition: A | Discharge status: Home / Self Care | Payer: Medicare HMO | Source: Ambulatory Visit | Attending: Urology | Admitting: Urology

## 2022-06-20 ENCOUNTER — Encounter (HOSPITAL_COMMUNITY): Payer: Self-pay

## 2022-06-20 VITALS — BP 146/82 | HR 65 | Temp 98.2°F | Resp 14 | Ht 70.0 in | Wt 188.0 lb

## 2022-06-20 DIAGNOSIS — Z01818 Encounter for other preprocedural examination: Secondary | ICD-10-CM | POA: Diagnosis not present

## 2022-06-20 DIAGNOSIS — E119 Type 2 diabetes mellitus without complications: Secondary | ICD-10-CM | POA: Insufficient documentation

## 2022-06-20 LAB — CBC
HCT: 38.9 % — ABNORMAL LOW (ref 39.0–52.0)
Hemoglobin: 13.3 g/dL (ref 13.0–17.0)
MCH: 31.3 pg (ref 26.0–34.0)
MCHC: 34.2 g/dL (ref 30.0–36.0)
MCV: 91.5 fL (ref 80.0–100.0)
Platelets: 185 10*3/uL (ref 150–400)
RBC: 4.25 MIL/uL (ref 4.22–5.81)
RDW: 12.6 % (ref 11.5–15.5)
WBC: 5.4 10*3/uL (ref 4.0–10.5)
nRBC: 0 % (ref 0.0–0.2)

## 2022-06-20 LAB — BASIC METABOLIC PANEL
Anion gap: 6 (ref 5–15)
BUN: 16 mg/dL (ref 8–23)
CO2: 27 mmol/L (ref 22–32)
Calcium: 9.3 mg/dL (ref 8.9–10.3)
Chloride: 106 mmol/L (ref 98–111)
Creatinine, Ser: 0.8 mg/dL (ref 0.61–1.24)
GFR, Estimated: >60 mL/min (ref >60)
Glucose, Bld: 202 mg/dL — ABNORMAL HIGH (ref 70–99)
Potassium: 4.4 mmol/L (ref 3.5–5.1)
Sodium: 139 mmol/L (ref 135–145)

## 2022-06-20 LAB — GLUCOSE, CAPILLARY: Glucose-Capillary: 210 mg/dL — ABNORMAL HIGH (ref 70–99)

## 2022-06-20 LAB — HEMOGLOBIN A1C
Hgb A1c MFr Bld: 7.1 % — ABNORMAL HIGH (ref 4.8–5.6)
Mean Plasma Glucose: 157.07 mg/dL

## 2022-06-23 NOTE — Anesthesia Preprocedure Evaluation (Addendum)
Anesthesia Evaluation  Patient identified by MRN, date of birth, ID band Patient awake    Reviewed: Allergy & Precautions, NPO status , Patient's Chart, lab work & pertinent test results  History of Anesthesia Complications Negative for: history of anesthetic complications  Airway Mallampati: III  TM Distance: >3 FB Neck ROM: Limited   Comment: Previous grade I view with Miller 3, easy mask Dental   No teeth but has 4 screws for implants on the bottom:   Pulmonary neg pulmonary ROS, former smoker   Pulmonary exam normal breath sounds clear to auscultation       Cardiovascular hypertension (amlodipine, losartan), (-) angina (-) Past MI, (-) Cardiac Stents, (-) CABG and (-) DOE + dysrhythmias (RBBB)  Rhythm:Regular Rate:Normal     Neuro/Psych  Headaches PSYCHIATRIC DISORDERS  Depression    S/p cervical fusion  Neuromuscular disease (lumbar spondylolisthesis)    GI/Hepatic Neg liver ROS,GERD  Medicated,,  Endo/Other  diabetes (Hgb A1c 7.1), Poorly Controlled, Type 2Hypothyroidism    Renal/GU negative Renal ROS     Musculoskeletal   Abdominal  (+) - obese  Peds  Hematology negative hematology ROS (+)   Anesthesia Other Findings Bladder cancer  Reproductive/Obstetrics                             Anesthesia Physical Anesthesia Plan  ASA: 3  Anesthesia Plan: General   Post-op Pain Management:    Induction: Intravenous  PONV Risk Score and Plan: 2 and Ondansetron and Dexamethasone  Airway Management Planned: LMA  Additional Equipment:   Intra-op Plan:   Post-operative Plan: Extubation in OR  Informed Consent: I have reviewed the patients History and Physical, chart, labs and discussed the procedure including the risks, benefits and alternatives for the proposed anesthesia with the patient or authorized representative who has indicated his/her understanding and acceptance.      Dental advisory given  Plan Discussed with: CRNA and Anesthesiologist  Anesthesia Plan Comments: (Risks of general anesthesia discussed including, but not limited to, sore throat, hoarse voice, chipped/damaged teeth, injury to vocal cords, nausea and vomiting, allergic reactions, lung infection, heart attack, stroke, and death. All questions answered. )       Anesthesia Quick Evaluation

## 2022-06-24 ENCOUNTER — Ambulatory Visit (HOSPITAL_BASED_OUTPATIENT_CLINIC_OR_DEPARTMENT_OTHER): Payer: Medicare HMO | Admitting: Anesthesiology

## 2022-06-24 ENCOUNTER — Encounter (HOSPITAL_COMMUNITY): Payer: Self-pay | Admitting: Urology

## 2022-06-24 ENCOUNTER — Ambulatory Visit (HOSPITAL_COMMUNITY): Payer: Medicare HMO | Admitting: Anesthesiology

## 2022-06-24 ENCOUNTER — Ambulatory Visit (HOSPITAL_COMMUNITY): Payer: Medicare HMO

## 2022-06-24 ENCOUNTER — Encounter (HOSPITAL_COMMUNITY)
Admission: RE | Disposition: A | Discharge status: Home / Self Care | Payer: Self-pay | Source: Home / Self Care | Attending: Urology

## 2022-06-24 ENCOUNTER — Ambulatory Visit (HOSPITAL_COMMUNITY)
Admission: RE | Admit: 2022-06-24 | Discharge: 2022-06-24 | Disposition: A | Discharge status: Home / Self Care | Payer: Medicare HMO | Attending: Urology | Admitting: Urology

## 2022-06-24 DIAGNOSIS — D090 Carcinoma in situ of bladder: Secondary | ICD-10-CM | POA: Diagnosis not present

## 2022-06-24 DIAGNOSIS — E039 Hypothyroidism, unspecified: Secondary | ICD-10-CM | POA: Insufficient documentation

## 2022-06-24 DIAGNOSIS — Z6826 Body mass index (BMI) 26.0-26.9, adult: Secondary | ICD-10-CM | POA: Diagnosis not present

## 2022-06-24 DIAGNOSIS — K219 Gastro-esophageal reflux disease without esophagitis: Secondary | ICD-10-CM | POA: Diagnosis not present

## 2022-06-24 DIAGNOSIS — I10 Essential (primary) hypertension: Secondary | ICD-10-CM | POA: Insufficient documentation

## 2022-06-24 DIAGNOSIS — E119 Type 2 diabetes mellitus without complications: Secondary | ICD-10-CM

## 2022-06-24 DIAGNOSIS — F32A Depression, unspecified: Secondary | ICD-10-CM | POA: Diagnosis not present

## 2022-06-24 DIAGNOSIS — Z8551 Personal history of malignant neoplasm of bladder: Secondary | ICD-10-CM

## 2022-06-24 DIAGNOSIS — Z87891 Personal history of nicotine dependence: Secondary | ICD-10-CM | POA: Diagnosis not present

## 2022-06-24 DIAGNOSIS — E669 Obesity, unspecified: Secondary | ICD-10-CM | POA: Insufficient documentation

## 2022-06-24 DIAGNOSIS — Z79899 Other long term (current) drug therapy: Secondary | ICD-10-CM | POA: Insufficient documentation

## 2022-06-24 DIAGNOSIS — C679 Malignant neoplasm of bladder, unspecified: Secondary | ICD-10-CM | POA: Diagnosis not present

## 2022-06-24 DIAGNOSIS — E1165 Type 2 diabetes mellitus with hyperglycemia: Secondary | ICD-10-CM | POA: Insufficient documentation

## 2022-06-24 HISTORY — PX: CYSTOSCOPY W/ RETROGRADES: SHX1426

## 2022-06-24 HISTORY — PX: CYSTOSCOPY WITH BIOPSY: SHX5122

## 2022-06-24 LAB — GLUCOSE, CAPILLARY
Glucose-Capillary: 138 mg/dL — ABNORMAL HIGH (ref 70–99)
Glucose-Capillary: 141 mg/dL — ABNORMAL HIGH (ref 70–99)

## 2022-06-24 SURGERY — CYSTOSCOPY, WITH RETROGRADE PYELOGRAM
Anesthesia: General

## 2022-06-24 MED ORDER — FENTANYL CITRATE PF 50 MCG/ML IJ SOSY: 25.0000 ug | PREFILLED_SYRINGE | INTRAMUSCULAR | Status: DC | PRN

## 2022-06-24 MED ORDER — STERILE WATER FOR IRRIGATION IR SOLN
Status: DC | PRN
  Administered 2022-06-24: 3000 mL

## 2022-06-24 MED ORDER — LIDOCAINE 2% (20 MG/ML) 5 ML SYRINGE
INTRAMUSCULAR | Status: DC | PRN
  Administered 2022-06-24: 100 mg via INTRAVENOUS

## 2022-06-24 MED ORDER — ORAL CARE MOUTH RINSE: 15.0000 mL | Freq: Once | OROMUCOSAL | Status: AC

## 2022-06-24 MED ORDER — IOHEXOL 300 MG/ML  SOLN
INTRAMUSCULAR | Status: DC | PRN
  Administered 2022-06-24: 28 mL

## 2022-06-24 MED ORDER — FENTANYL CITRATE (PF) 100 MCG/2ML IJ SOLN
INTRAMUSCULAR | Status: DC | PRN
  Administered 2022-06-24: 50 ug via INTRAVENOUS
  Administered 2022-06-24 (×2): 25 ug via INTRAVENOUS

## 2022-06-24 MED ORDER — ONDANSETRON HCL 4 MG/2ML IJ SOLN
INTRAMUSCULAR | Status: AC
  Filled 2022-06-24: qty 4

## 2022-06-24 MED ORDER — LACTATED RINGERS IV SOLN: INTRAVENOUS | Status: DC

## 2022-06-24 MED ORDER — PROPOFOL 10 MG/ML IV BOLUS
INTRAVENOUS | Status: DC | PRN
  Administered 2022-06-24: 200 mg via INTRAVENOUS

## 2022-06-24 MED ORDER — ONDANSETRON HCL 4 MG/2ML IJ SOLN
INTRAMUSCULAR | Status: DC | PRN
  Administered 2022-06-24: 4 mg via INTRAVENOUS

## 2022-06-24 MED ORDER — SODIUM CHLORIDE 0.9 % IR SOLN
Status: DC | PRN
  Administered 2022-06-24: 3000 mL via INTRAVESICAL

## 2022-06-24 MED ORDER — PHENYLEPHRINE 80 MCG/ML (10ML) SYRINGE FOR IV PUSH (FOR BLOOD PRESSURE SUPPORT)
PREFILLED_SYRINGE | INTRAVENOUS | Status: AC
  Filled 2022-06-24: qty 10

## 2022-06-24 MED ORDER — FENTANYL CITRATE (PF) 100 MCG/2ML IJ SOLN
INTRAMUSCULAR | Status: AC
  Filled 2022-06-24: qty 2

## 2022-06-24 MED ORDER — PHENYLEPHRINE HCL (PRESSORS) 10 MG/ML IV SOLN
INTRAVENOUS | Status: AC
  Filled 2022-06-24: qty 1

## 2022-06-24 MED ORDER — LIDOCAINE HCL (PF) 2 % IJ SOLN
INTRAMUSCULAR | Status: AC
  Filled 2022-06-24: qty 25

## 2022-06-24 MED ORDER — OXYCODONE HCL 5 MG PO TABS: 5.0000 mg | ORAL_TABLET | Freq: Once | ORAL | Status: DC | PRN

## 2022-06-24 MED ORDER — MIDAZOLAM HCL 2 MG/2ML IJ SOLN
INTRAMUSCULAR | Status: AC
  Filled 2022-06-24: qty 2

## 2022-06-24 MED ORDER — ACETAMINOPHEN 10 MG/ML IV SOLN: 1000.0000 mg | Freq: Once | INTRAVENOUS | Status: DC | PRN

## 2022-06-24 MED ORDER — PROPOFOL 500 MG/50ML IV EMUL
INTRAVENOUS | Status: AC
  Filled 2022-06-24: qty 50

## 2022-06-24 MED ORDER — DEXAMETHASONE SODIUM PHOSPHATE 10 MG/ML IJ SOLN
INTRAMUSCULAR | Status: AC
  Filled 2022-06-24: qty 2

## 2022-06-24 MED ORDER — CHLORHEXIDINE GLUCONATE 0.12 % MT SOLN
15.0000 mL | Freq: Once | OROMUCOSAL | Status: AC
  Administered 2022-06-24: 15 mL via OROMUCOSAL

## 2022-06-24 MED ORDER — CEFAZOLIN SODIUM-DEXTROSE 2-4 GM/100ML-% IV SOLN
2.0000 g | INTRAVENOUS | Status: AC
  Administered 2022-06-24: 2 g via INTRAVENOUS
  Filled 2022-06-24: qty 100

## 2022-06-24 MED ORDER — DEXAMETHASONE SODIUM PHOSPHATE 10 MG/ML IJ SOLN
INTRAMUSCULAR | Status: DC | PRN
  Administered 2022-06-24: 5 mg via INTRAVENOUS

## 2022-06-24 MED ORDER — EPHEDRINE 5 MG/ML INJ
INTRAVENOUS | Status: AC
  Filled 2022-06-24: qty 5

## 2022-06-24 MED ORDER — PROMETHAZINE HCL 25 MG/ML IJ SOLN: 6.2500 mg | INTRAMUSCULAR | Status: DC | PRN

## 2022-06-24 MED ORDER — LIDOCAINE HCL URETHRAL/MUCOSAL 2 % EX GEL
CUTANEOUS | Status: AC
  Filled 2022-06-24: qty 30

## 2022-06-24 MED ORDER — OXYCODONE HCL 5 MG/5ML PO SOLN: 5.0000 mg | Freq: Once | ORAL | Status: DC | PRN

## 2022-06-24 SURGICAL SUPPLY — 23 items
BAG COUNTER SPONGE SURGICOUNT (BAG) IMPLANT
BAG URINE DRAIN 2000ML AR STRL (UROLOGICAL SUPPLIES) IMPLANT
BAG URO CATCHER STRL LF (MISCELLANEOUS) ×2 IMPLANT
CATH FOLEY 2WAY SLVR 30CC 24FR (CATHETERS) IMPLANT
CATH URETL OPEN END 6FR 70 (CATHETERS) ×2 IMPLANT
CLOTH BEACON ORANGE TIMEOUT ST (SAFETY) ×2 IMPLANT
DRAPE FOOT SWITCH (DRAPES) ×2 IMPLANT
EVACUATOR MICROVAS BLADDER (UROLOGICAL SUPPLIES) IMPLANT
GLOVE SURG LX STRL 8.0 MICRO (GLOVE) ×2 IMPLANT
GOWN STRL REUS W/ TWL XL LVL3 (GOWN DISPOSABLE) ×4 IMPLANT
GOWN STRL REUS W/TWL XL LVL3 (GOWN DISPOSABLE) ×4
GUIDEWIRE STR DUAL SENSOR (WIRE) ×2 IMPLANT
KIT TURNOVER KIT A (KITS) IMPLANT
LOOP CUT BIPOLAR 24F LRG (ELECTROSURGICAL) ×2 IMPLANT
MANIFOLD NEPTUNE II (INSTRUMENTS) ×2 IMPLANT
NDL SAFETY ECLIP 18X1.5 (MISCELLANEOUS) ×2 IMPLANT
PACK CYSTO (CUSTOM PROCEDURE TRAY) ×2 IMPLANT
PENCIL SMOKE EVACUATOR (MISCELLANEOUS) IMPLANT
SYR TOOMEY IRRIG 70ML (MISCELLANEOUS) ×2
SYRINGE TOOMEY IRRIG 70ML (MISCELLANEOUS) IMPLANT
TUBING CONNECTING 10 (TUBING) ×2 IMPLANT
TUBING UROLOGY SET (TUBING) ×2 IMPLANT
WATER STERILE IRR 3000ML UROMA (IV SOLUTION) ×2 IMPLANT

## 2022-06-24 NOTE — Anesthesia Procedure Notes (Signed)
Date/Time: 06/24/2022 9:28 AM  Performed by: Cynda Familia, CRNAOxygen Delivery Method: Simple face mask Placement Confirmation: positive ETCO2 and breath sounds checked- equal and bilateral Dental Injury: Teeth and Oropharynx as per pre-operative assessment

## 2022-06-24 NOTE — Discharge Instructions (Signed)
You may see some blood in the urine and may have some burning with urination for 48-72 hours. You also may notice that you have to urinate more frequently or urgently after your procedure which is normal.  You should call should you develop an inability urinate, fever > 101, persistent nausea and vomiting that prevents you from eating or drinking to stay hydrated.    

## 2022-06-24 NOTE — H&P (Signed)
H&P  Chief Complaint: Bladder cancer  History of Present Illness: 81 year old male presents for cystoscopy, bilateral retrogrades, possible bladder biopsy.  He has a history of high-grade nonmuscle invasive bladder cancer/CIS.  He has undergone BCG which she did not tolerate, as well as intravesical mitomycin treatment.  Recent cystoscopy was negative but cytologies were positive.  Past Medical History:  Diagnosis Date   Bladder cancer ([place])    Blind right eye    BPH (benign prostatic hypertrophy)    Cataracts, both eyes    Depression    DM type 2 (diabetes mellitus, type 2) (HCC)    DOE (dyspnea on exertion)    with heavy exertion   Full dentures    GERD (gastroesophageal reflux disease)    Glaucoma no peripheral or side vision   right eye blind   Glaucoma, right eye    HOH (hard of hearing)    Hyperlipidemia    Hypertension    Hypothyroidism    Local recurrence of cancer of urinary bladder ([place]) urologsit-  dr Diona Fanti   1st dx 01-29-2016--  now recurrent 05-31-2016   Lower extremity edema 07/11/2020   right swells more than left goes down at night after sleeping   Lower urinary tract symptoms (LUTS) 2019   Migraine    Peripheral neuropathy    middle finger left hand   Pneumonia 2019   Skin abnormality    base of spine looks like pimple has had since 07-01-2020, no drainage area intact   Wears glasses     Past Surgical History:  Procedure Laterality Date   ANTERIOR CERVICAL DECOMP/DISCECTOMY FUSION  08-15-2000   C5 -- C6   ANTERIOR CERVICAL DECOMP/DISCECTOMY FUSION  03-04-2008   C6 -- C7   areas removed from back at spine  2021 jan and aug 2021   CYSTOSCOPY W/ RETROGRADES Left 05/11/2020   Procedure: Cystoscopy with left retrograde ureteropyelogram, resection of probable bladder tumor and left trigonal/ureteral orifice region, 3 cm in size, fluoroscopic interpretation, left ureteroscopy, placement of 6 French by 26 cm contour double-J stent without tether;  Surgeon:  Franchot Gallo, MD;  Location: [facility];  Service: Urology;  Laterality: Left;   CYSTOSCOPY WITH BIOPSY N/A 01/29/2016   Procedure: CYSTOSCOPY WITH BIOPSY;  Surgeon: Franchot Gallo, MD;  Location: [facility];  Service: Urology;  Laterality: N/A;   CYSTOSCOPY WITH BIOPSY N/A 05/31/2016   Procedure: CYSTOSCOPY WITH BIOPSY;  Surgeon: Franchot Gallo, MD;  Location[facility];  Service: Urology;  Laterality: N/A;   EYE SURGERY Right [place] and 2000   lower back  06/01/2019, 02-10-2020   nose cancer  areas removed  2019, 2021   x 2 / 3/ 2021 small area removed from nose   surgery for sacral fracture  40 yrs ago   TRANSURETHRAL RESECTION OF BLADDER TUMOR N/A 07/17/2020   Procedure: TRANSURETHRAL RESECTION OF BLADDER TUMOR (TURBT);  Surgeon: Franchot Gallo, MD;  Location: [facility];  Service: Urology;  Laterality: N/A;    Home Medications:    Allergies: No Known Allergies  Family History  Problem Relation Age of Onset   Heart disease Mother    Heart disease Father     Social History:  reports that he quit smoking about 18 years ago. His smoking use included cigarettes. He has a 50.00 pack-year smoking history. He has never used smokeless tobacco. He reports current alcohol use. He reports  that he does not use drugs.  ROS: A complete review of systems was performed.  All systems are negative except for pertinent findings as noted.  Physical Exam:  Vital signs in last 24 hours: BP (!) 165/84   Pulse 72   Temp 98.3 F (36.8 C) (Oral)   Resp 16   Ht '5\' 10"'$  (1.778 m)   Wt 85.3 kg   SpO2 98%   BMI 26.98 kg/m  Constitutional:  Alert and oriented, No acute distress Cardiovascular: Regular rate  Respiratory: Normal respiratory effort Neurologic: Grossly intact, no focal deficits Psychiatric: Normal mood and affect  I have reviewed prior pt notes  I have reviewed  notes from referring/previous physicians  I have reviewed urinalysis results   Impression/Assessment:  History of high-grade nonmuscle invasive urothelial carcinoma with recent positive cytologies  Plan:  Cystoscopy, bilateral retrograde pyelograms, bladder biopsies

## 2022-06-24 NOTE — Anesthesia Postprocedure Evaluation (Signed)
Anesthesia Post Note  Patient: [name]  Procedure(s) Performed: CYSTOSCOPY WITH RETROGRADE PYELOGRAM (Bilateral) CYSTOSCOPY WITH BIOPSY     Patient location during evaluation: PACU Anesthesia Type: General Level of consciousness: awake Pain management: pain level controlled Vital Signs Assessment: post-procedure vital signs reviewed and stable Respiratory status: spontaneous breathing, nonlabored ventilation and respiratory function stable Cardiovascular status: blood pressure returned to baseline and stable Postop Assessment: no apparent nausea or vomiting Anesthetic complications: no   No notable events documented.  Last Vitals:  Vitals:   06/24/22 1015 06/24/22 1034  BP: (!) 164/76 136/80  Pulse: 64 60  Resp: 17   Temp:  36.6 C  SpO2: 91% 93%    Last Pain:  Vitals:   06/24/22 1034  TempSrc:   PainSc: 0-No pain                 Nilda Simmer

## 2022-06-24 NOTE — Transfer of Care (Signed)
Immediate Anesthesia Transfer of Care Note  Patient: Keith [name]  Procedure(s) Performed: CYSTOSCOPY WITH RETROGRADE PYELOGRAM (Bilateral) CYSTOSCOPY WITH BIOPSY  Patient Location: PACU  Anesthesia Type:General  Level of Consciousness: awake  Airway & Oxygen Therapy: Patient Spontanous Breathing and Patient connected to face mask oxygen  Post-op Assessment: Report given to RN and Post -op Vital signs reviewed and stable  Post vital signs: Reviewed  Last Vitals:  Vitals Value Taken Time  BP 143/69 06/24/22 0934  Temp    Pulse 66 06/24/22 0935  Resp 10 06/24/22 0935  SpO2 100 % 06/24/22 0935  Vitals shown include unvalidated device data.  Last Pain:  Vitals:   06/24/22 0704  TempSrc: Oral  PainSc:          Complications: No notable events documented.

## 2022-06-24 NOTE — Anesthesia Procedure Notes (Signed)
Procedure Name: LMA Insertion Date/Time: 06/24/2022 8:48 AM  Performed by: Cynda Familia, CRNAPre-anesthesia Checklist: Patient identified, Emergency Drugs available, Suction available and Patient being monitored Patient Re-evaluated:Patient Re-evaluated prior to induction Oxygen Delivery Method: Circle System Utilized Preoxygenation: Pre-oxygenation with 100% oxygen Induction Type: IV induction Ventilation: Mask ventilation without difficulty LMA: LMA inserted and LMA with gastric port inserted LMA Size: 4.0 Number of attempts: 1 Placement Confirmation: positive ETCO2 Tube secured with: Tape Dental Injury: Teeth and Oropharynx as per pre-operative assessment  Comments: IV induction Allen-- LMA insertion AM CRNA atraumatic-- no teeth - dental posts intact as preop- bilat BS Zenia Resides

## 2022-06-24 NOTE — Op Note (Signed)
Preoperative diagnosis: History of nonmuscle invasive bladder cancer, high-grade with recent positive cytologies  Postoperative diagnosis: Same  Principal procedure: Cystoscopy, bilateral retrograde ureteropyelogram's, fluoroscopic interpretation, bladder biopsies, bladder washings  Surgeon: Jayshun Galentine  Anesthesia: General with LMA  Complications: None  Estimated blood loss: Less than 5 mL  Specimens: 1.  Bladder washings for cytology 2.  Bladder biopsies for permanent pathology  Drains: None  Indications: 81 year old male with history of nonmuscle invasive high-grade bladder cancer.  Original resection was in 2017.  He had recurrence later that year.  He underwent BCG induction which he did not tolerate due to febrile illness.  Eventually, he underwent intravesical gemcitabine administration in 2021 for recurrent cancer.  He has had no evidence of recurrence until recent positive cytologies with mild erythema surrounding the resection site in the left lateral trigonal area.  He presents for cystoscopy, retrogrades and bladder biopsies.  Findings: Urethra was normal, without stricture or lesion.  Prostate was mildly obstructive from hypertrophy.  Urothelium of the bladder was normal except for patchy erythema surrounding prior resection site in the left trigonal region.  There were no raised lesions.  Right ureteral orifice was normal, left was slightly laterally placed.  Retrograde study of both ureters with Omnipaque was then performed.  The right ureter and pyelocalyceal system was normal, without filling defects.  The left ureter and pyelocalyceal system was slightly dilated.  However, no filling defects or strictures were noted.  There was adequate emptying.  Description of procedure: The patient was properly identified in the holding area and received preoperative antibiotics.  Is taken to the operating room where general anesthetic was administered with the LMA.  He was placed in the  dorsolithotomy position.  Genitalia and perineum were prepped, draped, proper timeout performed.  21 French panendoscope was advanced directly to the bladder.  Circumferential inspection was then performed.  Above-mentioned findings were noted.  The right ureteral orifice was cannulated with a 6 [place] open-ended catheter.  Using Omnipaque, gentle retrograde ureteropyelogram was performed.  This was normal.  Similar procedure on the left.  The left ureteral orifice was a bit difficult to localize as it was somewhat laterally placed.  The study was then performed using the same catheter and Omnipaque.  There was mild dilation of the ureter and pyelocalyceal system.  However, there was no evidence of filling defects.  At this point, the cold cup biopsy forceps were placed.  The entire area around the old resection site was biopsied.  Approximately 5 specimens were taken and sent for permanent pathology.  Utilizing the Bugbee electrode, the biopsy sites were cauterized.  Additionally, all erythematous urothelium was cauterized.  At this point there was no bleeding.  The scope was then removed, the bladder drained, and the patient was awakened.  He was taken to the PACU in stable condition, having tolerated the procedure well.

## 2022-06-25 ENCOUNTER — Encounter (HOSPITAL_COMMUNITY): Payer: Self-pay | Admitting: Urology

## 2022-06-25 LAB — SURGICAL PATHOLOGY

## 2022-06-25 LAB — CYTOLOGY - NON PAP

## 2022-07-03 DIAGNOSIS — E1169 Type 2 diabetes mellitus with other specified complication: Secondary | ICD-10-CM | POA: Diagnosis not present

## 2022-07-03 DIAGNOSIS — E039 Hypothyroidism, unspecified: Secondary | ICD-10-CM | POA: Diagnosis not present

## 2022-07-03 DIAGNOSIS — Z125 Encounter for screening for malignant neoplasm of prostate: Secondary | ICD-10-CM | POA: Diagnosis not present

## 2022-07-03 DIAGNOSIS — E785 Hyperlipidemia, unspecified: Secondary | ICD-10-CM | POA: Diagnosis not present

## 2022-07-03 DIAGNOSIS — I10 Essential (primary) hypertension: Secondary | ICD-10-CM | POA: Diagnosis not present

## 2022-07-03 DIAGNOSIS — R7989 Other specified abnormal findings of blood chemistry: Secondary | ICD-10-CM | POA: Diagnosis not present

## 2022-07-11 DIAGNOSIS — E039 Hypothyroidism, unspecified: Secondary | ICD-10-CM | POA: Diagnosis not present

## 2022-07-11 DIAGNOSIS — Z0000 Encounter for general adult medical examination without abnormal findings: Secondary | ICD-10-CM | POA: Diagnosis not present

## 2022-07-11 DIAGNOSIS — Z8551 Personal history of malignant neoplasm of bladder: Secondary | ICD-10-CM | POA: Diagnosis not present

## 2022-07-11 DIAGNOSIS — E785 Hyperlipidemia, unspecified: Secondary | ICD-10-CM | POA: Diagnosis not present

## 2022-07-11 DIAGNOSIS — Z23 Encounter for immunization: Secondary | ICD-10-CM | POA: Diagnosis not present

## 2022-07-11 DIAGNOSIS — N401 Enlarged prostate with lower urinary tract symptoms: Secondary | ICD-10-CM | POA: Diagnosis not present

## 2022-07-11 DIAGNOSIS — E1169 Type 2 diabetes mellitus with other specified complication: Secondary | ICD-10-CM | POA: Diagnosis not present

## 2022-07-11 DIAGNOSIS — D692 Other nonthrombocytopenic purpura: Secondary | ICD-10-CM | POA: Diagnosis not present

## 2022-07-11 DIAGNOSIS — R82998 Other abnormal findings in urine: Secondary | ICD-10-CM | POA: Diagnosis not present

## 2022-07-11 DIAGNOSIS — G894 Chronic pain syndrome: Secondary | ICD-10-CM | POA: Diagnosis not present

## 2022-07-26 DIAGNOSIS — C678 Malignant neoplasm of overlapping sites of bladder: Secondary | ICD-10-CM | POA: Diagnosis not present

## 2022-07-29 DIAGNOSIS — Z961 Presence of intraocular lens: Secondary | ICD-10-CM | POA: Diagnosis not present

## 2022-07-29 DIAGNOSIS — H2512 Age-related nuclear cataract, left eye: Secondary | ICD-10-CM | POA: Diagnosis not present

## 2022-07-29 DIAGNOSIS — E119 Type 2 diabetes mellitus without complications: Secondary | ICD-10-CM | POA: Diagnosis not present

## 2022-07-29 DIAGNOSIS — H40021 Open angle with borderline findings, high risk, right eye: Secondary | ICD-10-CM | POA: Diagnosis not present

## 2022-08-20 NOTE — Assessment & Plan Note (Deleted)
-  diagnosed in 2017  -He was found to have high-grade urothelial carcinoma without muscle invasion and developed and subsequently BCG refractory disease in December 2021. He received Intravesicular gemcitabine with a total of 2000 mg weekly started in February 2022  -f/u with urologist Dr. Diona Fanti  -due to positive cytology, he underwent Cystoscopy, bilateral retrograde ureteropyelogram's, fluoroscopic interpretation, bladder biopsies, bladder washings on 06/24/2022 and biopsy showed focal urothelial carcinoma in situ (CIS)

## 2022-08-21 ENCOUNTER — Other Ambulatory Visit: Payer: Self-pay

## 2022-08-21 ENCOUNTER — Inpatient Hospital Stay: Payer: Medicare HMO | Attending: Hematology | Admitting: Hematology

## 2022-08-21 ENCOUNTER — Encounter: Payer: Self-pay | Admitting: Hematology

## 2022-08-21 VITALS — BP 136/71 | HR 83 | Temp 98.3°F | Resp 15 | Ht 69.0 in | Wt 188.4 lb

## 2022-08-21 DIAGNOSIS — C671 Malignant neoplasm of dome of bladder: Secondary | ICD-10-CM

## 2022-08-21 DIAGNOSIS — Z5111 Encounter for antineoplastic chemotherapy: Secondary | ICD-10-CM | POA: Diagnosis not present

## 2022-08-21 DIAGNOSIS — C670 Malignant neoplasm of trigone of bladder: Secondary | ICD-10-CM | POA: Diagnosis not present

## 2022-08-21 DIAGNOSIS — R350 Frequency of micturition: Secondary | ICD-10-CM | POA: Insufficient documentation

## 2022-08-21 DIAGNOSIS — Z79899 Other long term (current) drug therapy: Secondary | ICD-10-CM | POA: Insufficient documentation

## 2022-08-21 NOTE — Progress Notes (Deleted)
[place]   Telephone:([phone] Fax:([phone]   Clinic New Consult Note   Patient Care Team: Sueanne Margarita, DO as PCP - General (Internal Medicine)  Date of Service:  08/21/2022   CHIEF COMPLAINTS/PURPOSE OF CONSULTATION:  ***  REFERRING PHYSICIAN:  ***  ASSESSMENT & PLAN:  Keith [name] is a 82 y.o. *** male with a history of ***  1. 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 male is a here because of ***. The patient was referred by ***. The patient presents to the clinic today accompanied by ***.   {Story of what lead them here}   Today the patient notes they felt/feeling prior/after...   {She/He} has a PMHx of....    Socially...    REVIEW OF SYSTEMS:   *** Constitutional: Denies fevers, chills or abnormal night sweats Eyes: Denies blurriness of vision, double vision or watery eyes Ears, nose, mouth, throat, and face: Denies mucositis or sore throat Respiratory: Denies cough, dyspnea or wheezes Cardiovascular: Denies palpitation, chest discomfort or lower extremity swelling Gastrointestinal:  Denies nausea, heartburn or change in bowel habits Skin: Denies abnormal skin rashes Lymphatics: Denies new lymphadenopathy or easy bruising Neurological:Denies numbness, tingling or new  weaknesses Behavioral/Psych: Mood is stable, no new changes  All other systems were reviewed with the patient and are negative.   MEDICAL HISTORY:  Past Medical History:  Diagnosis Date   Keith cancer ([place])    Blind right eye    BPH (benign prostatic hypertrophy)    Cataracts, both eyes    Depression    DM type 2 (diabetes mellitus, type 2) (HCC)    DOE (dyspnea on exertion)    with heavy exertion   Full dentures    GERD (gastroesophageal reflux disease)    Glaucoma no peripheral or side vision   right eye blind   Glaucoma, right eye    HOH (hard of hearing)    Hyperlipidemia    Hypertension    Hypothyroidism    Keith recurrence of cancer of urinary Keith ([place]) urologsit-  dr Diona Fanti   1st dx 01-29-2016--  now recurrent 05-31-2016   Lower extremity edema 07/11/2020   right swells more than left goes down at night after sleeping   Lower urinary tract symptoms (LUTS) 2019   Migraine    Peripheral neuropathy    middle finger left hand   Pneumonia 2019   Skin abnormality    base of spine looks like pimple has had since 07-01-2020, no drainage area intact   Wears glasses     SURGICAL HISTORY: Past Surgical History:  Procedure Laterality Date   ANTERIOR CERVICAL DECOMP/DISCECTOMY FUSION  08-15-2000   C5 -- C6   ANTERIOR CERVICAL DECOMP/DISCECTOMY FUSION  03-04-2008   C6 -- C7   areas removed  from back at spine  2021 jan and aug 2021   CYSTOSCOPY W/ RETROGRADES Left 05/11/2020   Procedure: Cystoscopy with left retrograde ureteropyelogram, resection of probable Keith tumor and left trigonal/ureteral orifice region, 3 cm in size, fluoroscopic interpretation, left ureteroscopy, placement of 6 French by 26 cm contour double-J stent without tether;  Surgeon: Franchot Gallo, MD;  Location: [facility];  Service: Urology;  Laterality: Left;   CYSTOSCOPY W/ RETROGRADES Bilateral 06/24/2022   Procedure: CYSTOSCOPY WITH RETROGRADE PYELOGRAM;  Surgeon:  Franchot Gallo, MD;  Location: [facility];  Service: Urology;  Laterality: Bilateral;  1 HR   CYSTOSCOPY WITH BIOPSY N/A 01/29/2016   Procedure: CYSTOSCOPY WITH BIOPSY;  Surgeon: Franchot Gallo, MD;  Location: [facility];  Service: Urology;  Laterality: N/A;   CYSTOSCOPY WITH BIOPSY N/A 05/31/2016   Procedure: CYSTOSCOPY WITH BIOPSY;  Surgeon: Franchot Gallo, MD;  Location[facility];  Service: Urology;  Laterality: N/A;   CYSTOSCOPY WITH BIOPSY N/A 06/24/2022   Procedure: CYSTOSCOPY WITH BIOPSY;  Surgeon: Franchot Gallo, MD;  Location: [facility];  Service: Urology;  Laterality: N/A;   EYE SURGERY Right [place] and 2000   lower back  06/01/2019, 02-10-2020   nose cancer  areas removed  2019, 2021   x 2 / 3/ 2021 small area removed from nose   surgery for sacral fracture  40 yrs ago   TRANSURETHRAL RESECTION OF Keith TUMOR N/A 07/17/2020   Procedure: TRANSURETHRAL RESECTION OF Keith TUMOR (TURBT);  Surgeon: Franchot Gallo, MD;  Location[facility];  Service: Urology;  Laterality: N/A;    SOCIAL HISTORY: Social History   Socioeconomic History   Marital status: Married    Spouse name: Not on file   Number of children: Not on file   Years of education: Not on file   Highest education level: Not on file  Occupational History   Not on file  Tobacco Use   Smoking status: Former    Packs/day: 1.00    Years: 50.00    Total pack years: 50.00    Types: Cigarettes    Quit date: 06/27/2004    Years since quitting: 18.1   Smokeless tobacco: Never  Vaping Use   Vaping Use: Never used  Substance and Sexual Activity   Alcohol use: Yes    Alcohol/week: 0.0 standard drinks of alcohol    Comment: occ moonshine garles throat with   Drug use: No   Sexual activity: Not on file  Other Topics Concern   Not on file  Social History Narrative   Not on file   Social Determinants of Health    Financial Resource Strain: Not on file  Food Insecurity: Not on file  Transportation Needs: Not on file  Physical Activity: Not on file  Stress: Not on file  Social Connections: Not on file  Intimate Partner Violence: Not on file    FAMILY HISTORY: Family History  Problem Relation Age of Onset   Heart disease Mother    Heart disease Father     ALLERGIES:  has No Known Allergies.  MEDICATIONS:  Current Outpatient Medications  Medication Sig Dispense Refill   acetaminophen (TYLENOL) 500 MG tablet Take 1,000 mg by mouth every 8 (eight) hours as needed for moderate pain.     amLODipine (NORVASC) 10 MG tablet Take 10 mg by mouth daily.     Aspirin-Salicylamide-Caffeine (ARTHRITIS STRENGTH BC POWDER PO) Take  1 packet by mouth daily as needed (pain).     atorvastatin (LIPITOR) 20 MG tablet Take 20 mg by mouth at bedtime.      calcium carbonate (TUMS - DOSED IN MG ELEMENTAL CALCIUM) 500 MG chewable tablet Chew 1 tablet by mouth as needed for indigestion or heartburn.     carteolol (OCUPRESS) 1 % ophthalmic solution Place 1 drop into the right eye every morning.     cetirizine (ZYRTEC) 10 MG tablet Take 10 mg by mouth daily as needed for allergies.     clotrimazole-betamethasone (LOTRISONE) lotion Apply 1 Application topically daily as needed (irritation).     fluticasone (FLONASE) 50 MCG/ACT nasal spray Place 2 sprays into both nostrils daily as needed for allergies.      glimepiride (AMARYL) 1 MG tablet Take 1 mg by mouth daily with breakfast.     ibuprofen (ADVIL) 200 MG tablet Take 600 mg by mouth every 8 (eight) hours as needed for moderate pain.     latanoprost (XALATAN) 0.005 % ophthalmic solution Place 1 drop into the right eye at bedtime.     levothyroxine (SYNTHROID, LEVOTHROID) 100 MCG tablet Take 100 mcg by mouth daily before breakfast.      losartan (COZAAR) 100 MG tablet Take 100 mg by mouth at bedtime.      meloxicam (MOBIC) 15 MG tablet Take 15 mg by mouth daily.      Misc Natural Products (COMPLETE PROSTATE HEALTH PO) Take 1 tablet by mouth 2 (two) times daily.     omeprazole (PRILOSEC) 20 MG capsule Take 20 mg by mouth daily.     sitaGLIPtin (JANUVIA) 100 MG tablet Take 100 mg by mouth every morning.     tamsulosin (FLOMAX) 0.4 MG CAPS capsule Take 0.4 mg by mouth every morning.      vitamin B-12 (CYANOCOBALAMIN) 500 MCG tablet Take 500 mcg by mouth daily.     No current facility-administered medications for this visit.    PHYSICAL EXAMINATION: ECOG PERFORMANCE STATUS: {CHL ONC ECOG PS:[phone]}  There were no vitals filed for this visit. There were no vitals filed for this visit. *** GENERAL:alert, no distress and comfortable SKIN: skin color, texture, turgor are normal, no rashes or significant lesions EYES: normal, Conjunctiva are pink and non-injected, sclera clear {OROPHARYNX:no exudate, no erythema and lips, buccal mucosa, and tongue normal}  NECK: supple, thyroid normal size, non-tender, without nodularity LYMPH:  no palpable lymphadenopathy in the cervical, axillary {or inguinal} LUNGS: clear to auscultation and percussion with normal breathing effort HEART: regular rate & rhythm and no murmurs and no lower extremity edema ABDOMEN:abdomen soft, non-tender and normal bowel sounds Musculoskeletal:no cyanosis of digits and no clubbing  NEURO: alert & oriented x 3 with fluent speech, no focal motor/sensory deficits  LABORATORY DATA:  I have reviewed the data as listed    Latest Ref Rng & Units 06/20/2022    8:47 AM 07/17/2020    8:17 AM 02/12/2020    9:29 PM  CBC  WBC 4.0 - 10.5 K/uL 5.4   8.0   Hemoglobin 13.0 - 17.0 g/dL 13.3  13.6  11.7   Hematocrit 39.0 - 52.0 % 38.9  40.0  34.9   Platelets 150 - 400 K/uL 185   147        Latest Ref Rng & Units 06/20/2022    8:47 AM 07/17/2020    8:17 AM 05/11/2020    1:00 PM  CMP  Glucose 70 - 99 mg/dL 202  146  104  BUN 8 - 23 mg/dL '16  15  14   '$ Creatinine 0.61 - 1.24 mg/dL 0.80  0.70  0.82    Sodium 135 - 145 mmol/L 139  140  140   Potassium 3.5 - 5.1 mmol/L 4.4  4.0  4.4   Chloride 98 - 111 mmol/L 106  104  106   CO2 22 - 32 mmol/L 27   23   Calcium 8.9 - 10.3 mg/dL 9.3   9.5      RADIOGRAPHIC STUDIES: I have personally reviewed the radiological images as listed and agreed with the findings in the report. No results found.   No orders of the defined types were placed in this encounter.   All questions were answered. The patient knows to call the clinic with any problems, questions or concerns. The total time spent in the appointment was {CHL ONC TIME VISIT - IRWER:[id number]}.     Baldemar Friday, K[place]me 08/21/2022 8:31 AM  I, Audry Riles am acting as scribe for Truitt Merle, MD.   {Add scribe attestation statement}

## 2022-08-21 NOTE — Progress Notes (Signed)
Continue Plan of Care option selected for existing Pathways status Referral to Other Provider 

## 2022-08-21 NOTE — Progress Notes (Signed)
DISCONTINUE ON PATHWAY REGIMEN - Bladder  No Medical Intervention - Off Treatment.  REASON: Disease Progression PRIOR TREATMENT: Bladder41: Other Intravesical Therapy in Consultation with Urology  Bladder - No Medical Intervention - Off Treatment.  Patient Characteristics: Pre-Cystectomy or Nonsurgical Candidate, M0 (Clinical Staging), High-Grade cTa, cN0 or cTis/cT1, cN0, No Prior Intravesical Therapy Therapeutic Status: Pre-Cystectomy or Nonsurgical Candidate, M0 (Clinical Staging) AJCC M Category: cM0 AJCC 8 Stage Grouping: 0is AJCC T Category: cTis AJCC N Category: cN0

## 2022-08-21 NOTE — Progress Notes (Signed)
[facility]   [phone] [phone]   Clinic Follow up Note   Patient Care Team: Sueanne Margarita, DO as PCP - General (Internal Medicine)  Date of Service:  08/21/2022  CHIEF COMPLAINT: recurrent bladder Cancer  CURRENT THERAPY:  Pending   ASSESSMENT:  Keith [name] is a 82 y.o. male with   Bladder cancer ([place]) -multiple recurrence, high grade, T1, Tis  --initially diagnosed in 2017  -He was found to have high-grade urothelial carcinoma without muscle invasion and developed and subsequently BCG refractory disease in December 2021. He received Intravesicular gemcitabine with a total of 2000 mg weekly X6 started in February 2022  -f/u with urologist Dr. Diona Fanti  -due to positive cytology, he underwent Cystoscopy, bilateral retrograde ureteropyelogram's, fluoroscopic interpretation, bladder biopsies, bladder washings on 06/24/2022 and biopsy showed focal urothelial carcinoma in situ (CIS) and cytology was positive for high grade urothelial carcinoma -I recommend intravesical gemcitabine 2000 mg weekly x 6, which is a category 1 recommendation based on NCCN guideline.  I also discussed option of systemic immunotherapy with Keytruda.  Failure and potential side effect discussed with him.  After discussion, patient agreed to proceed with intravesical gemcitabine -Potential benefit and side effects, especially bladder spasm, were discussed with patient and his wife in detail.  He agrees to proceed.  I will call in Ditropan 5 mg for him to take 2 hours before chemo each time. -Follow-up after 2 cycle chemo.   PLAN: - discuss diagnoses with pt  -Recommend intravesical gemcitabine once a week for 6 wks - referral to genetic testing -lab every 2 treatments  -f/u before 3rd or 4th treatment   SUMMARY OF ONCOLOGIC HISTORY: Oncology History  Bladder cancer ([place])  09/12/2020 Initial Diagnosis   Bladder cancer ([place])   09/12/2020 Cancer Staging   Staging  form: Urinary Bladder, AJCC 8th Edition - Clinical: Stage I (cT1, cN0, cM0) - Signed by Wyatt Portela, MD on 09/12/2020 WHO/ISUP grade (low/high): High Grade Histologic grading system: 2 grade system   06/24/2022 Pathology Results             FINAL MICROSCOPIC DIAGNOSIS:  A. BLADDER, BIOPSY: Focal urothelial carcinoma in situ (CIS)     06/24/2022 Cancer Staging   Staging form: Urinary Bladder, AJCC 8th Edition - Pathologic stage from 06/24/2022: Stage 0is (pTis, pN0, cM0) - Signed by Truitt Merle, MD on 08/24/2022 Stage prefix: Initial diagnosis WHO/ISUP grade (low/high): High Grade Histologic grading system: 2 grade system   08/28/2022 -  Chemotherapy   Patient is on Treatment Plan : BLADDER Gemcitabine INTRAVESICAL (2000) q7d     Malignant neoplasm of trigone of urinary bladder ([place])  09/12/2020 Initial Diagnosis   Malignant neoplasm of trigone of urinary bladder ([place])   09/26/2020 - 10/31/2020 Chemotherapy   Patient is on Treatment Plan : BLADDER Gemcitabine q7d        INTERVAL HISTORY:  Keith [name] is here for a recurrent bladder cancer.  He was previously under my partner Dr. Hazeline Junker care, who is leaving out practise. He was last seen by Dr.Shadad on 10/31/20 He presents to the clinic accompanied by wife.   He was initially diagnosed with early stage, non-muscular invasion bladder cancer in 2017.  He underwent TURBT and BCG treatment.  He developed febrile illness after BCG treatment and stopped. He had recurrent disease in December 2021, received intravesical gemcitabine with a total of 2000 mg weekly for 6 weeks in February 2020.  Moderate treatment overall well.  He has been followed by his urologist Dr. Diona Fanti with cystoscopy every 6 months.  Pt denies having blood in urine, but has chronic frequent urination.  His last cystoscopy in November 2021 which was negative for visible new lesion but biopsy showed focal urothelial carcinoma in situ, and cytology was  positive for high-grade urothelial carcinoma.  He was referred to [place] to consider further treatment.    All other systems were reviewed with the patient and are negative.  MEDICAL HISTORY:  Past Medical History:  Diagnosis Date   Bladder cancer ([place])    Blind right eye    BPH (benign prostatic hypertrophy)    Cataracts, both eyes    Depression    DM type 2 (diabetes mellitus, type 2) (HCC)    DOE (dyspnea on exertion)    with heavy exertion   Full dentures    GERD (gastroesophageal reflux disease)    Glaucoma no peripheral or side vision   right eye blind   Glaucoma, right eye    HOH (hard of hearing)    Hyperlipidemia    Hypertension    Hypothyroidism    Local recurrence of cancer of urinary bladder ([place]) urologsit-  dr Diona Fanti   1st dx 01-29-2016--  now recurrent 05-31-2016   Lower extremity edema 07/11/2020   right swells more than left goes down at night after sleeping   Lower urinary tract symptoms (LUTS) 2019   Migraine    Peripheral neuropathy    middle finger left hand   Pneumonia 2019   Skin abnormality    base of spine looks like pimple has had since 07-01-2020, no drainage area intact   Wears glasses     SURGICAL HISTORY: Past Surgical History:  Procedure Laterality Date   ANTERIOR CERVICAL DECOMP/DISCECTOMY FUSION  08-15-2000   C5 -- C6   ANTERIOR CERVICAL DECOMP/DISCECTOMY FUSION  03-04-2008   C6 -- C7   areas removed from back at spine  2021 jan and aug 2021   CYSTOSCOPY W/ RETROGRADES Left 05/11/2020   Procedure: Cystoscopy with left retrograde ureteropyelogram, resection of probable bladder tumor and left trigonal/ureteral orifice region, 3 cm in size, fluoroscopic interpretation, left ureteroscopy, placement of 6 French by 26 cm contour double-J stent without tether;  Surgeon: Franchot Gallo, MD;  Location[facility];  Service: Urology;  Laterality: Left;   CYSTOSCOPY W/ RETROGRADES Bilateral 06/24/2022   Procedure: CYSTOSCOPY  WITH RETROGRADE PYELOGRAM;  Surgeon: Franchot Gallo, MD;  Location: [facility];  Service: Urology;  Laterality: Bilateral;  1 HR   CYSTOSCOPY WITH BIOPSY N/A 01/29/2016   Procedure: CYSTOSCOPY WITH BIOPSY;  Surgeon: Franchot Gallo, MD;  Location: [facility];  Service: Urology;  Laterality: N/A;   CYSTOSCOPY WITH BIOPSY N/A 05/31/2016   Procedure: CYSTOSCOPY WITH BIOPSY;  Surgeon: Franchot Gallo, MD;  Location: [facility];  Service: Urology;  Laterality: N/A;   CYSTOSCOPY WITH BIOPSY N/A 06/24/2022   Procedure: CYSTOSCOPY WITH BIOPSY;  Surgeon: Franchot Gallo, MD;  Location: [facility];  Service: Urology;  Laterality: N/A;   EYE SURGERY Right [place] and 2000   lower back  06/01/2019, 02-10-2020   nose cancer  areas removed  2019, 2021   x 2 / 3/ 2021 small area removed from nose   surgery for sacral fracture  40 yrs ago   TRANSURETHRAL RESECTION OF BLADDER TUMOR N/A 07/17/2020   Procedure: TRANSURETHRAL RESECTION OF BLADDER TUMOR (TURBT);  Surgeon: Franchot Gallo, MD;  Location: [facility];  Service: Urology;  Laterality: N/A;    I have reviewed the social history and family history with the patient and they are unchanged from previous note.  ALLERGIES:  has No Known Allergies.  MEDICATIONS:  Current Outpatient Medications  Medication Sig Dispense Refill   acetaminophen (TYLENOL) 500 MG tablet Take 1,000 mg by mouth every 8 (eight) hours as needed for moderate pain.     amLODipine (NORVASC) 10 MG tablet Take 10 mg by mouth daily.     Aspirin-Salicylamide-Caffeine (ARTHRITIS STRENGTH BC POWDER PO) Take 1 packet by mouth daily as needed (pain).     atorvastatin (LIPITOR) 20 MG tablet Take 20 mg by mouth at bedtime.      calcium carbonate (TUMS - DOSED IN MG ELEMENTAL CALCIUM) 500 MG chewable tablet Chew 1 tablet by mouth as needed for indigestion or heartburn.     carteolol (OCUPRESS) 1 %  ophthalmic solution Place 1 drop into the right eye every morning.     cetirizine (ZYRTEC) 10 MG tablet Take 10 mg by mouth daily as needed for allergies.     clotrimazole-betamethasone (LOTRISONE) lotion Apply 1 Application topically daily as needed (irritation).     fluticasone (FLONASE) 50 MCG/ACT nasal spray Place 2 sprays into both nostrils daily as needed for allergies.      glimepiride (AMARYL) 1 MG tablet Take 1 mg by mouth daily with breakfast.     ibuprofen (ADVIL) 200 MG tablet Take 600 mg by mouth every 8 (eight) hours as needed for moderate pain.     latanoprost (XALATAN) 0.005 % ophthalmic solution Place 1 drop into the right eye at bedtime.     levothyroxine (SYNTHROID, LEVOTHROID) 100 MCG tablet Take 100 mcg by mouth daily before breakfast.      losartan (COZAAR) 100 MG tablet Take 100 mg by mouth at bedtime.      meloxicam (MOBIC) 15 MG tablet Take 15 mg by mouth daily.     Misc Natural Products (COMPLETE PROSTATE HEALTH PO) Take 1 tablet by mouth 2 (two) times daily.     omeprazole (PRILOSEC) 20 MG capsule Take 20 mg by mouth daily.     sitaGLIPtin (JANUVIA) 100 MG tablet Take 100 mg by mouth every morning.     tamsulosin (FLOMAX) 0.4 MG CAPS capsule Take 0.4 mg by mouth every morning.      vitamin B-12 (CYANOCOBALAMIN) 500 MCG tablet Take 500 mcg by mouth daily.     No current facility-administered medications for this visit.    PHYSICAL EXAMINATION: ECOG PERFORMANCE STATUS: 0 - Asymptomatic  There were no vitals filed for this visit. Wt Readings from Last 3 Encounters:  06/24/22 188 lb 0.8 oz (85.3 kg)  06/20/22 188 lb (85.3 kg)  10/31/20 194 lb 1.6 oz (88 kg)    GENERAL:alert, no distress and comfortable SKIN: skin color, texture, turgor are normal, no rashes or significant lesions EYES: normal, Conjunctiva are pink and non-injected, sclera clear NECK: supple, thyroid normal size, non-tender, without nodularity LYMPH: (-)  no palpable lymphadenopathy in the  cervical, axillary  LUNGS: (-) clear to auscultation and percussion with normal breathing effort HEART:(-)  regular rate & rhythm and no murmurs and no lower extremity edema ABDOMEN(-) :abdomen soft, non-tender and normal bowel sounds Liver not enlarge Musculoskeletal:no cyanosis of digits and no clubbing  NEURO: alert & oriented x 3 with fluent speech, no focal motor/sensory deficits  LABORATORY DATA:  I have reviewed the  data as listed    Latest Ref Rng & Units 06/20/2022    8:47 AM 07/17/2020    8:17 AM 02/12/2020    9:29 PM  CBC  WBC 4.0 - 10.5 K/uL 5.4   8.0   Hemoglobin 13.0 - 17.0 g/dL 13.3  13.6  11.7   Hematocrit 39.0 - 52.0 % 38.9  40.0  34.9   Platelets 150 - 400 K/uL 185   147         Latest Ref Rng & Units 06/20/2022    8:47 AM 07/17/2020    8:17 AM 05/11/2020    1:00 PM  CMP  Glucose 70 - 99 mg/dL 202  146  104   BUN 8 - 23 mg/dL '16  15  14   '$ Creatinine 0.61 - 1.24 mg/dL 0.80  0.70  0.82   Sodium 135 - 145 mmol/L 139  140  140   Potassium 3.5 - 5.1 mmol/L 4.4  4.0  4.4   Chloride 98 - 111 mmol/L 106  104  106   CO2 22 - 32 mmol/L 27   23   Calcium 8.9 - 10.3 mg/dL 9.3   9.5       RADIOGRAPHIC STUDIES: I have personally reviewed the radiological images as listed and agreed with the findings in the report. No results found.    No orders of the defined types were placed in this encounter.  All questions were answered. The patient knows to call the clinic with any problems, questions or concerns. No barriers to learning was detected. The total time spent in the appointment was 40 minutes.     Truitt Merle, MD 08/21/2022   Felicity Coyer, CMA, am acting as scribe for Truitt Merle, MD.   I have reviewed the above documentation for accuracy and completeness, and I agree with the above.

## 2022-08-22 ENCOUNTER — Telehealth: Payer: Self-pay | Admitting: Hematology

## 2022-08-22 NOTE — Telephone Encounter (Signed)
Spoke with patient wife regarding all upcoming appointments

## 2022-08-23 ENCOUNTER — Other Ambulatory Visit: Payer: Self-pay

## 2022-08-24 ENCOUNTER — Other Ambulatory Visit: Payer: Self-pay

## 2022-08-24 ENCOUNTER — Encounter: Payer: Self-pay | Admitting: Hematology

## 2022-08-24 MED ORDER — OXYBUTYNIN CHLORIDE ER 5 MG PO TB24: 5.0000 mg | ORAL_TABLET | ORAL | 0 refills | Status: DC

## 2022-08-24 NOTE — Assessment & Plan Note (Addendum)
-  multiple recurrence, high grade, T1, Tis  --initially diagnosed in 2017  -He was found to have high-grade urothelial carcinoma without muscle invasion and developed and subsequently BCG refractory disease in December 2021. He received Intravesicular gemcitabine with a total of 2000 mg weekly X6 started in February 2022  -f/u with urologist Dr. Diona Fanti  -due to positive cytology, he underwent Cystoscopy, bilateral retrograde ureteropyelogram's, fluoroscopic interpretation, bladder biopsies, bladder washings on 06/24/2022 and biopsy showed focal urothelial carcinoma in situ (CIS) and cytology was positive for high grade urothelial carcinoma -I recommend intravesical gemcitabine 2000 mg weekly x 6, which is a category 1 recommendation based on NCCN guideline.  I also discussed option of systemic immunotherapy with Keytruda.  Failure and potential side effect discussed with him.  After discussion, patient agreed to proceed with intravesical gemcitabine -Potential benefit and side effects, especially bladder spasm, were discussed with patient and his wife in detail.  He agrees to proceed.  I will call in Ditropan 5 mg for him to take 2 hours before chemo each time. -Follow-up after 2 cycle chemo.

## 2022-08-26 ENCOUNTER — Telehealth: Payer: Self-pay

## 2022-08-26 NOTE — Telephone Encounter (Signed)
Faxed over a copy of the patients last office visit as per Dr. Burr Medico

## 2022-08-26 NOTE — Telephone Encounter (Addendum)
Relayed the below information to the patient with patient voiced understanding    ----- Message from Truitt Merle, MD sent at 08/24/2022 11:21 AM EST ----- Please let pt know that I called in Ditropan, let him take it 2hr before his scheduled chemo here. Please also send my note to Dr. Diona Fanti at [facility]  Thx  Krista Blue

## 2022-08-28 ENCOUNTER — Inpatient Hospital Stay: Payer: Medicare HMO

## 2022-08-28 ENCOUNTER — Other Ambulatory Visit: Payer: Self-pay

## 2022-08-28 ENCOUNTER — Inpatient Hospital Stay (HOSPITAL_BASED_OUTPATIENT_CLINIC_OR_DEPARTMENT_OTHER): Payer: Medicare HMO | Admitting: Hematology

## 2022-08-28 VITALS — BP 141/73 | HR 78 | Temp 98.3°F | Resp 16

## 2022-08-28 DIAGNOSIS — C679 Malignant neoplasm of bladder, unspecified: Secondary | ICD-10-CM | POA: Diagnosis not present

## 2022-08-28 DIAGNOSIS — C670 Malignant neoplasm of trigone of bladder: Secondary | ICD-10-CM | POA: Diagnosis not present

## 2022-08-28 DIAGNOSIS — N368 Other specified disorders of urethra: Secondary | ICD-10-CM | POA: Diagnosis not present

## 2022-08-28 DIAGNOSIS — Z79899 Other long term (current) drug therapy: Secondary | ICD-10-CM | POA: Diagnosis not present

## 2022-08-28 DIAGNOSIS — C671 Malignant neoplasm of dome of bladder: Secondary | ICD-10-CM

## 2022-08-28 DIAGNOSIS — Z5111 Encounter for antineoplastic chemotherapy: Secondary | ICD-10-CM | POA: Diagnosis not present

## 2022-08-28 DIAGNOSIS — R350 Frequency of micturition: Secondary | ICD-10-CM | POA: Diagnosis not present

## 2022-08-28 LAB — CMP (CANCER CENTER ONLY)
ALT: 18 U/L (ref 0–44)
AST: 19 U/L (ref 15–41)
Albumin: 4.2 g/dL (ref 3.5–5.0)
Alkaline Phosphatase: 62 U/L (ref 38–126)
Anion gap: 6 (ref 5–15)
BUN: 13 mg/dL (ref 8–23)
CO2: 27 mmol/L (ref 22–32)
Calcium: 9.4 mg/dL (ref 8.9–10.3)
Chloride: 103 mmol/L (ref 98–111)
Creatinine: 0.92 mg/dL (ref 0.61–1.24)
GFR, Estimated: >60 mL/min (ref >60)
Glucose, Bld: 176 mg/dL — ABNORMAL HIGH (ref 70–99)
Potassium: 3.7 mmol/L (ref 3.5–5.1)
Sodium: 136 mmol/L (ref 135–145)
Total Bilirubin: 0.4 mg/dL (ref 0.3–1.2)
Total Protein: 7.2 g/dL (ref 6.5–8.1)

## 2022-08-28 LAB — CBC WITH DIFFERENTIAL (CANCER CENTER ONLY)
Abs Immature Granulocytes: 0.02 10*3/uL (ref 0.00–0.07)
Basophils Absolute: 0 10*3/uL (ref 0.0–0.1)
Basophils Relative: 1 %
Eosinophils Absolute: 0.4 10*3/uL (ref 0.0–0.5)
Eosinophils Relative: 6 %
HCT: 39.4 % (ref 39.0–52.0)
Hemoglobin: 13.9 g/dL (ref 13.0–17.0)
Immature Granulocytes: 0 %
Lymphocytes Relative: 23 %
Lymphs Abs: 1.4 10*3/uL (ref 0.7–4.0)
MCH: 31.4 pg (ref 26.0–34.0)
MCHC: 35.3 g/dL (ref 30.0–36.0)
MCV: 88.9 fL (ref 80.0–100.0)
Monocytes Absolute: 0.6 10*3/uL (ref 0.1–1.0)
Monocytes Relative: 10 %
Neutro Abs: 3.7 10*3/uL (ref 1.7–7.7)
Neutrophils Relative %: 60 %
Platelet Count: 203 10*3/uL (ref 150–400)
RBC: 4.43 MIL/uL (ref 4.22–5.81)
RDW: 12.3 % (ref 11.5–15.5)
WBC Count: 6 10*3/uL (ref 4.0–10.5)
nRBC: 0 % (ref 0.0–0.2)

## 2022-08-28 MED ORDER — PROCHLORPERAZINE MALEATE 10 MG PO TABS
10.0000 mg | ORAL_TABLET | Freq: Once | ORAL | Status: AC
  Administered 2022-08-28: 10 mg via ORAL
  Filled 2022-08-28: qty 1

## 2022-08-28 MED ORDER — LIDOCAINE HCL URETHRAL/MUCOSAL 2 % EX GEL
1.0000 | Freq: Once | CUTANEOUS | Status: AC
  Administered 2022-08-28: 1 via URETHRAL
  Filled 2022-08-28: qty 1

## 2022-08-28 MED ORDER — GEMCITABINE CHEMO FOR BLADDER INSTILLATION 2000 MG
2000.0000 mg | Freq: Once | INTRAVENOUS | Status: AC
  Administered 2022-08-28: 2000 mg via INTRAVESICAL
  Filled 2022-08-28: qty 52.6

## 2022-08-28 MED ORDER — OXYBUTYNIN CHLORIDE 5 MG PO TABS: 5.0000 mg | ORAL_TABLET | Freq: Once | ORAL | Status: DC | PRN

## 2022-08-28 NOTE — Patient Instructions (Signed)
[facility]COL[facility]  Discharge Instructions: Thank you for choosing [facility] to provide your oncology and hematology care.   If you have a lab appointment with the [facility], please go directly to the [facility] and check in at the registration area.   Wear comfortable clothing and clothing appropriate for easy access to any Portacath or PICC line.   We strive to give you quality time with your provider. You may need to reschedule your appointment if you arrive late (15 or more minutes).  Arriving late affects you and other patients whose appointments are after yours.  Also, if you miss three or more appointments without notifying the office, you may be dismissed from the clinic at the provider's discretion.      For prescription refill requests, have your pharmacy contact our office and allow 72 hours for refills to be completed.    Today you received the following chemotherapy and/or immunotherapy agents Gemzar      To help prevent nausea and vomiting after your treatment, we encourage you to take your nausea medication as directed.  BELOW ARE SYMPTOMS THAT SHOULD BE REPORTED IMMEDIATELY: *FEVER GREATER THAN 100.4 F (38 C) OR HIGHER *CHILLS OR SWEATING *NAUSEA AND VOMITING THAT IS NOT CONTROLLED WITH YOUR NAUSEA MEDICATION *UNUSUAL SHORTNESS OF BREATH *UNUSUAL BRUISING OR BLEEDING *URINARY PROBLEMS (pain or burning when urinating, or frequent urination) *BOWEL PROBLEMS (unusual diarrhea, constipation, pain near the anus) TENDERNESS IN MOUTH AND THROAT WITH OR WITHOUT PRESENCE OF ULCERS (sore throat, sores in mouth, or a toothache) UNUSUAL RASH, SWELLING OR PAIN  UNUSUAL VAGINAL DISCHARGE OR ITCHING   Items with * indicate a potential emergency and should be followed up as soon as possible or go to the Emergency Department if any problems should occur.  Please show the CHEMOTHERAPY ALERT CARD or IMMUNOTHERAPY ALERT CARD at check-in to the  Emergency Department and triage nurse.  Should you have questions after your visit or need to cancel or reschedule your appointment, please contact [facility]  Dept: [phone]  and follow the prompts.  Office hours are 8:00 a.m. to 4:30 p.m. Monday - Friday. Please note that voicemails left after 4:00 p.m. may not be returned until the following business day.  We are closed weekends and major holidays. You have access to a nurse at all times for urgent questions. Please call the main number to the clinic Dept: [phone] and follow the prompts.   For any non-urgent questions, you may also contact your provider using MyChart. We now offer e-Visits for anyone 33 and older to request care online for non-urgent symptoms. For details visit [email or web address].   Also download the MyChart app! Go to the app store, search "MyChart", open the app, select [facility], and log in with your MyChart username and password.  Gemcitabine Injection What is this medication? GEMCITABINE (jem SYE ta been) treats some types of cancer. It works by slowing down the growth of cancer cells. This medicine may be used for other purposes; ask your health care provider or pharmacist if you have questions. COMMON BRAND NAME(S): Gemzar, Infugem What should I tell my care team before I take this medication? They need to know if you have any of these conditions: Blood disorders Infection Kidney disease Liver disease Lung or breathing disease, such as asthma or COPD Recent or ongoing radiation therapy An unusual or allergic reaction to gemcitabine, other medications, foods, dyes, or preservatives If you or  your partner are pregnant or trying to get pregnant Breast-feeding How should I use this medication? This medication is injected into a vein. It is given by your care team in a hospital or clinic setting. Talk to your care team about the use of this medication in children. Special  care may be needed. Overdosage: If you think you have taken too much of this medicine contact a poison control center or emergency room at once. NOTE: This medicine is only for you. Do not share this medicine with others. What if I miss a dose? Keep appointments for follow-up doses. It is important not to miss your dose. Call your care team if you are unable to keep an appointment. What may interact with this medication? Interactions have not been studied. This list may not describe all possible interactions. Give your health care provider a list of all the medicines, herbs, non-prescription drugs, or dietary supplements you use. Also tell them if you smoke, drink alcohol, or use illegal drugs. Some items may interact with your medicine. What should I watch for while using this medication? Your condition will be monitored carefully while you are receiving this medication. This medication may make you feel generally unwell. This is not uncommon, as chemotherapy can affect healthy cells as well as cancer cells. Report any side effects. Continue your course of treatment even though you feel ill unless your care team tells you to stop. In some cases, you may be given additional medications to help with side effects. Follow all directions for their use. This medication may increase your risk of getting an infection. Call your care team for advice if you get a fever, chills, sore throat, or other symptoms of a cold or flu. Do not treat yourself. Try to avoid being around people who are sick. This medication may increase your risk to bruise or bleed. Call your care team if you notice any unusual bleeding. Be careful brushing or flossing your teeth or using a toothpick because you may get an infection or bleed more easily. If you have any dental work done, tell your dentist you are receiving this medication. Avoid taking medications that contain aspirin, acetaminophen, ibuprofen, naproxen, or ketoprofen  unless instructed by your care team. These medications may hide a fever. Talk to your care team if you or your partner wish to become pregnant or think you might be pregnant. This medication can cause serious birth defects if taken during pregnancy and for 6 months after the last dose. A negative pregnancy test is required before starting this medication. A reliable form of contraception is recommended while taking this medication and for 6 months after the last dose. Talk to your care team about effective forms of contraception. Do not father a child while taking this medication and for 3 months after the last dose. Use a condom while having sex during this time period. Do not breastfeed while taking this medication and for at least 1 week after the last dose. This medication may cause infertility. Talk to your care team if you are concerned about your fertility. What side effects may I notice from receiving this medication? Side effects that you should report to your care team as soon as possible: Allergic reactions--skin rash, itching, hives, swelling of the face, lips, tongue, or throat Capillary leak syndrome--stomach or muscle pain, unusual weakness or fatigue, feeling faint or lightheaded, decrease in the amount of urine, swelling of the ankles, hands, or feet, trouble breathing Infection--fever, chills, cough, sore throat,  wounds that don't heal, pain or trouble when passing urine, general feeling of discomfort or being unwell Liver injury--right upper belly pain, loss of appetite, nausea, light-colored stool, dark yellow or brown urine, yellowing skin or eyes, unusual weakness or fatigue Low red blood cell level--unusual weakness or fatigue, dizziness, headache, trouble breathing Lung injury--shortness of breath or trouble breathing, cough, spitting up blood, chest pain, fever Stomach pain, bloody diarrhea, pale skin, unusual weakness or fatigue, decrease in the amount of urine, which may be  signs of hemolytic uremic syndrome Sudden and severe headache, confusion, change in vision, seizures, which may be signs of posterior reversible encephalopathy syndrome (PRES) Unusual bruising or bleeding Side effects that usually do not require medical attention (report to your care team if they continue or are bothersome): Diarrhea Drowsiness Hair loss Nausea Pain, redness, or swelling with sores inside the mouth or throat Vomiting This list may not describe all possible side effects. Call your doctor for medical advice about side effects. You may report side effects to FDA at 1-800-FDA-1088. Where should I keep my medication? This medication is given in a hospital or clinic. It will not be stored at home. NOTE: This sheet is a summary. It may not cover all possible information. If you have questions about this medicine, talk to your doctor, pharmacist, or health care provider.  2023 Elsevier/Gold Standard (2007-09-19 00:00:00)

## 2022-08-28 NOTE — Progress Notes (Signed)
[place]   [phone] [phone]   Clinic Follow up Note   Patient Care Team: Sueanne Margarita, DO as PCP - General (Internal Medicine) Truitt Merle, MD as Attending Physician (Hematology and Oncology)  Date of Service:  08/28/2022  CHIEF COMPLAINT: bladder cramps and irregular bleeding  CURRENT THERAPY:  Intravesical gemcitabine weeklyX6   ASSESSMENT:  Keith [name] is a 82 y.o. male with   Bladder cancer ([place]) --initially diagnosed in 2017  -He was found to have high-grade urothelial carcinoma without muscle invasion and developed and subsequently BCG refractory disease in December 2021. He received Intravesicular gemcitabine with a total of 2000 mg weekly X6 started in February 2022  -f/u with urologist Dr. Diona Fanti  -bladder biopsies, bladder washings on 06/24/2022 showed focal urothelial carcinoma in situ (CIS) and cytology was positive for high grade urothelial carcinoma -I recommended intravesical gemcitabine 2000 mg weekly x 6, he started on 08/28/16  Urethral bleeding -Given his first intravesical gemcitabine therapy today, he experienced with bladder cramps which improved with oxybutynin -He had multiple issues today, nurse had to flush the catheter with normal saline and use bladder scan to check his urine in the bladder -He subsequently developed a significant urethral bleeding with blood clots, the catheter was removed -He was able to void afterwards, plan to discharge him home with close monitoring. -He knows to call us back if he has recurrent bleeding.    PLAN: -He completed her first intravesical gemcitabine therapy today, which was complicated with bladder spasm, temporarily bladder retention, and subsequent significant Iressa bleeding with blood clots. -He was able to void after catheter was removed, and was discharged home in a stable condition.  He knows to call us back if needed.   SUMMARY OF ONCOLOGIC  HISTORY: Oncology History  Bladder cancer ([place])  09/12/2020 Initial Diagnosis   Bladder cancer ([place])   09/12/2020 Cancer Staging   Staging form: Urinary Bladder, AJCC 8th Edition - Clinical: Stage I (cT1, cN0, cM0) - Signed by Wyatt Portela, MD on 09/12/2020 WHO/ISUP grade (low/high): High Grade Histologic grading system: 2 grade system   06/24/2022 Pathology Results             FINAL MICROSCOPIC DIAGNOSIS:  A. 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  He was accompanied by his wife.  I was called by his infusion nurse multiple times during the treatment.  He initially had significant bladder spasms after treatment initiated, after all gemcitabine was administrated, he had urinary retention, required bladder scan, and normal saline flush.  Later on significantly aggressive bradycardia was noticed around the catheter, with blood clots.  Catheter was subsequently removed.  He was able to void afterwards.  His vital signs  were stable through the course of treatment.  All other systems were reviewed with the patient and are negative.  MEDICAL HISTORY:  Past Medical History:  Diagnosis Date   Bladder cancer ([place])    Blind right eye     BPH (benign prostatic hypertrophy)    Cataracts, both eyes    Depression    DM type 2 (diabetes mellitus, type 2) (HCC)    DOE (dyspnea on exertion)    with heavy exertion   Full dentures    GERD (gastroesophageal reflux disease)    Glaucoma no peripheral or side vision   right eye blind   Glaucoma, right eye    HOH (hard of hearing)    Hyperlipidemia    Hypertension    Hypothyroidism    Local recurrence of cancer of urinary bladder ([place]) urologsit-  dr Diona Fanti   1st dx 01-29-2016--  now recurrent 05-31-2016   Lower extremity edema 07/11/2020   right swells more than left goes down at night after sleeping   Lower urinary tract symptoms (LUTS) 2019   Migraine    Peripheral neuropathy    middle finger left hand   Pneumonia 2019   Skin abnormality    base of spine looks like pimple has had since 07-01-2020, no drainage area intact   Wears glasses     SURGICAL HISTORY: Past Surgical History:  Procedure Laterality Date   ANTERIOR CERVICAL DECOMP/DISCECTOMY FUSION  08-15-2000   C5 -- C6   ANTERIOR CERVICAL DECOMP/DISCECTOMY FUSION  03-04-2008   C6 -- C7   areas removed from back at spine  2021 jan and aug 2021   CYSTOSCOPY W/ RETROGRADES Left 05/11/2020   Procedure: Cystoscopy with left retrograde ureteropyelogram, resection of probable bladder tumor and left trigonal/ureteral orifice region, 3 cm in size, fluoroscopic interpretation, left ureteroscopy, placement of 6 French by 26 cm contour double-J stent without tether;  Surgeon: Franchot Gallo, MD;  Location[facility];  Service: Urology;  Laterality: Left;   CYSTOSCOPY W/ RETROGRADES Bilateral 06/24/2022   Procedure: CYSTOSCOPY WITH RETROGRADE PYELOGRAM;  Surgeon: Franchot Gallo, MD;  Location: [facility];  Service: Urology;  Laterality: Bilateral;  1 HR   CYSTOSCOPY WITH BIOPSY N/A 01/29/2016   Procedure: CYSTOSCOPY WITH BIOPSY;  Surgeon: Franchot Gallo, MD;  Location: [facility];   Service: Urology;  Laterality: N/A;   CYSTOSCOPY WITH BIOPSY N/A 05/31/2016   Procedure: CYSTOSCOPY WITH BIOPSY;  Surgeon: Franchot Gallo, MD;  Location: [facility];  Service: Urology;  Laterality: N/A;   CYSTOSCOPY WITH BIOPSY N/A 06/24/2022   Procedure: CYSTOSCOPY WITH BIOPSY;  Surgeon: Franchot Gallo, MD;  Location: [facility];  Service: Urology;  Laterality: N/A;   EYE SURGERY Right [place] and 2000   lower back  06/01/2019, 02-10-2020   nose cancer  areas removed  2019, 2021   x 2 / 3/ 2021 small area removed from nose   surgery for sacral fracture  40 yrs ago   TRANSURETHRAL RESECTION OF BLADDER TUMOR N/A 07/17/2020   Procedure: TRANSURETHRAL RESECTION OF BLADDER TUMOR (TURBT);  Surgeon: Franchot Gallo, MD;  Location: [facility];  Service: Urology;  Laterality: N/A;    I have reviewed the social history and family history with the patient and they are unchanged from previous note.  ALLERGIES:  has no active allergies.  MEDICATIONS:  Current Outpatient Medications  Medication Sig Dispense Refill   acetaminophen (TYLENOL) 500  MG tablet Take 1,000 mg by mouth every 8 (eight) hours as needed for moderate pain.     amLODipine (NORVASC) 10 MG tablet Take 10 mg by mouth daily.     Aspirin-Salicylamide-Caffeine (ARTHRITIS STRENGTH BC POWDER PO) Take 1 packet by mouth daily as needed (pain). (Patient not taking: Reported on 08/28/2022)     atorvastatin (LIPITOR) 20 MG tablet Take 20 mg by mouth at bedtime.      carteolol (OCUPRESS) 1 % ophthalmic solution Place 1 drop into the right eye every morning.     cetirizine (ZYRTEC) 10 MG tablet Take 10 mg by mouth daily as needed for allergies.     clotrimazole-betamethasone (LOTRISONE) lotion Apply 1 Application topically daily as needed (irritation).     fluticasone (FLONASE) 50 MCG/ACT nasal spray Place 2 sprays into both nostrils daily as needed for allergies.       glimepiride (AMARYL) 1 MG tablet Take 1 mg by mouth daily with breakfast.     ibuprofen (ADVIL) 200 MG tablet Take 600 mg by mouth every 8 (eight) hours as needed for moderate pain. (Patient not taking: Reported on 08/28/2022)     latanoprost (XALATAN) 0.005 % ophthalmic solution Place 1 drop into the right eye at bedtime.     levothyroxine (SYNTHROID, LEVOTHROID) 100 MCG tablet Take 100 mcg by mouth daily before breakfast.      losartan (COZAAR) 100 MG tablet Take 100 mg by mouth at bedtime.      meloxicam (MOBIC) 15 MG tablet Take 15 mg by mouth daily.     Misc Natural Products (COMPLETE PROSTATE HEALTH PO) Take 1 tablet by mouth 2 (two) times daily.     omeprazole (PRILOSEC) 20 MG capsule Take 20 mg by mouth daily.     oxybutynin (DITROPAN XL) 5 MG 24 hr tablet Take 1 tablet (5 mg total) by mouth once a week. Take 2 hours before scheduled chemo in our office 10 tablet 0   sitaGLIPtin (JANUVIA) 100 MG tablet Take 100 mg by mouth every morning.     tamsulosin (FLOMAX) 0.4 MG CAPS capsule Take 0.4 mg by mouth every morning.      vitamin B-12 (CYANOCOBALAMIN) 500 MCG tablet Take 500 mcg by mouth daily.     No current facility-administered medications for this visit.    PHYSICAL EXAMINATION: ECOG PERFORMANCE STATUS: 1 - Symptomatic but completely ambulatory  There were no vitals filed for this visit. Wt Readings from Last 3 Encounters:  08/21/22 188 lb 6.4 oz (85.5 kg)  06/24/22 188 lb 0.8 oz (85.3 kg)  06/20/22 188 lb (85.3 kg)     GENERAL:alert, no distress and comfortable SKIN: skin color, texture, turgor are normal, no rashes or significant lesions EYES: normal, Conjunctiva are pink and non-injected, sclera clear NEURO: alert & oriented x 3 with fluent speech, no focal motor/sensory deficits  LABORATORY DATA:  I have reviewed the data as listed    Latest Ref Rng & Units 08/28/2022   11:25 AM 06/20/2022    8:47 AM 07/17/2020    8:17 AM  CBC  WBC 4.0 - 10.5 K/uL 6.0  5.4     Hemoglobin 13.0 - 17.0 g/dL 13.9  13.3  13.6   Hematocrit 39.0 - 52.0 % 39.4  38.9  40.0   Platelets 150 - 400 K/uL 203  185          Latest Ref Rng & Units 08/28/2022   11:25 AM 06/20/2022    8:47 AM 07/17/2020  8:17 AM  CMP  Glucose 70 - 99 mg/dL 176  202  146   BUN 8 - 23 mg/dL '13  16  15   '$ Creatinine 0.61 - 1.24 mg/dL 0.92  0.80  0.70   Sodium 135 - 145 mmol/L 136  139  140   Potassium 3.5 - 5.1 mmol/L 3.7  4.4  4.0   Chloride 98 - 111 mmol/L 103  106  104   CO2 22 - 32 mmol/L 27  27    Calcium 8.9 - 10.3 mg/dL 9.4  9.3    Total Protein 6.5 - 8.1 g/dL 7.2     Total Bilirubin 0.3 - 1.2 mg/dL 0.4     Alkaline Phos 38 - 126 U/L 62     AST 15 - 41 U/L 19     ALT 0 - 44 U/L 18         RADIOGRAPHIC STUDIES: I have personally reviewed the radiological images as listed and agreed with the findings in the report. No results found.    No orders of the defined types were placed in this encounter.  All questions were answered. The patient knows to call the clinic with any problems, questions or concerns. No barriers to learning was detected. The total time spent in the appointment was 30 minutes.     Truitt Merle, MD 08/28/2022

## 2022-08-28 NOTE — Progress Notes (Signed)
Patient took prescribed oxybutin prior to arrival for his infusion appointment.  Intravesicular gemcitabine instilled via 18 french foley catheter. Dose of Gemcitabine administered partially in supine position- 39cc of 52.6cc due to patient feeling discomfort and pressure. Dr. Burr Medico notified. Patient turned to left side, 10cc NS removed from foley balloon and the remaining gemzar instilled. Patient tolerated better. Patient rotated Q15 minutes -- supine, left side, prone, right side -- with minimal leakage. Patient completed instillation and rolled to his back. Blood was noted on the tip of the penis and the end of catheter. Patient denied any pain or spasms. Catheter was unclamped and patient c/o of a slight burning. 5 cc of fluid drained into catheter bag and stopped. Catheter was clamped and flushed with 7 10cc flushes per Dr. Burr Medico. No results provided. Patient began to have bladder spasms, clots and blood started to secrete from around the catheter.   Bladder scan showed bladder volume of 528. Dr. Burr Medico was called- She advised to d/c catheter and allow patient to void to discharge chemotherapy. Patient was able to void 475 and numerous chux were discarded in chemo bins with drainage. Post void bladder scan volume 182.   Per Dr. Burr Medico- patient educated on hydration, return to ED precautions. VSS and patient ambulatory to lobby.

## 2022-08-29 ENCOUNTER — Telehealth: Payer: Self-pay

## 2022-08-29 NOTE — Telephone Encounter (Signed)
Spoke with pt via telephone regarding infusion encounter on 08/28/2022.  Pt and spouse were both on telephone with this nurse.  Pt stated he is no longer passing blood clots from his penis.  Pt denied pain when urinated or difficulty urinating.  Pt stated he's urinated 2 to 3 times today and described urine to be dark reddish/brown color with some pink tinge.  Spouse stated that the pt had fever last night of 100.4 and a temp of 98.9 today.  Pt took temp while on the phone with this RN '@[id number]'$  and pt's temp was 98.1.  Pt's spouse stated the pt is drinking plenty of fluids but does not want to take any Tylenol for the fevers.  Pt stated he feel overall pretty good just that his back hurts a little but pt stated he's always suffered from back pain.  Pt's spouse stated that the pt has been up walking around the house and working in the yard today.  Instructed pt and spouse to contact Dr. Ernestina Penna office should he continue to experience elevated temperatures, blood in his urine pass 3 days, and develop pain.  Pt and spouse verbalized understanding.  Spouse asked if pt's tx next week will be postponed.  Informed both pt and spouse that this RN will check with Dr. Burr Medico and will f/u with them a few days prior to the pt's infusion appt.  Both pt and spouse verbalized understanding and had no further questions or concerns at this time.

## 2022-08-31 ENCOUNTER — Encounter: Payer: Self-pay | Admitting: Hematology

## 2022-08-31 ENCOUNTER — Encounter (HOSPITAL_COMMUNITY): Payer: Self-pay

## 2022-08-31 ENCOUNTER — Emergency Department (HOSPITAL_COMMUNITY)
Admission: EM | Admit: 2022-08-31 | Discharge: 2022-08-31 | Disposition: A | Discharge status: Home / Self Care | Payer: Medicare HMO | Attending: Emergency Medicine | Admitting: Emergency Medicine

## 2022-08-31 ENCOUNTER — Emergency Department (HOSPITAL_COMMUNITY): Payer: Medicare HMO

## 2022-08-31 ENCOUNTER — Telehealth: Payer: Self-pay | Admitting: Hematology

## 2022-08-31 ENCOUNTER — Other Ambulatory Visit: Payer: Self-pay

## 2022-08-31 DIAGNOSIS — N368 Other specified disorders of urethra: Secondary | ICD-10-CM | POA: Insufficient documentation

## 2022-08-31 DIAGNOSIS — Z1152 Encounter for screening for COVID-19: Secondary | ICD-10-CM | POA: Diagnosis not present

## 2022-08-31 DIAGNOSIS — R509 Fever, unspecified: Secondary | ICD-10-CM

## 2022-08-31 DIAGNOSIS — Z8551 Personal history of malignant neoplasm of bladder: Secondary | ICD-10-CM | POA: Insufficient documentation

## 2022-08-31 DIAGNOSIS — I4510 Unspecified right bundle-branch block: Secondary | ICD-10-CM | POA: Diagnosis not present

## 2022-08-31 DIAGNOSIS — D649 Anemia, unspecified: Secondary | ICD-10-CM | POA: Insufficient documentation

## 2022-08-31 DIAGNOSIS — A419 Sepsis, unspecified organism: Secondary | ICD-10-CM | POA: Diagnosis not present

## 2022-08-31 LAB — CBC WITH DIFFERENTIAL/PLATELET
Abs Immature Granulocytes: 0.02 10*3/uL (ref 0.00–0.07)
Basophils Absolute: 0 10*3/uL (ref 0.0–0.1)
Basophils Relative: 1 %
Eosinophils Absolute: 0.3 10*3/uL (ref 0.0–0.5)
Eosinophils Relative: 6 %
HCT: 37.8 % — ABNORMAL LOW (ref 39.0–52.0)
Hemoglobin: 12.4 g/dL — ABNORMAL LOW (ref 13.0–17.0)
Immature Granulocytes: 0 %
Lymphocytes Relative: 22 %
Lymphs Abs: 1.1 10*3/uL (ref 0.7–4.0)
MCH: 30.4 pg (ref 26.0–34.0)
MCHC: 32.8 g/dL (ref 30.0–36.0)
MCV: 92.6 fL (ref 80.0–100.0)
Monocytes Absolute: 0.2 10*3/uL (ref 0.1–1.0)
Monocytes Relative: 4 %
Neutro Abs: 3.2 10*3/uL (ref 1.7–7.7)
Neutrophils Relative %: 67 %
Platelets: 190 10*3/uL (ref 150–400)
RBC: 4.08 MIL/uL — ABNORMAL LOW (ref 4.22–5.81)
RDW: 12.2 % (ref 11.5–15.5)
WBC: 4.9 10*3/uL (ref 4.0–10.5)
nRBC: 0 % (ref 0.0–0.2)

## 2022-08-31 LAB — URINALYSIS, ROUTINE W REFLEX MICROSCOPIC
Bacteria, UA: NONE SEEN
Bilirubin Urine: NEGATIVE
Glucose, UA: 150 mg/dL — AB
Ketones, ur: NEGATIVE mg/dL
Leukocytes,Ua: NEGATIVE
Nitrite: NEGATIVE
Protein, ur: 30 mg/dL — AB
Specific Gravity, Urine: 1.011 (ref 1.005–1.030)
pH: 5 (ref 5.0–8.0)

## 2022-08-31 LAB — COMPREHENSIVE METABOLIC PANEL
ALT: 22 U/L (ref 0–44)
AST: 24 U/L (ref 15–41)
Albumin: 4 g/dL (ref 3.5–5.0)
Alkaline Phosphatase: 50 U/L (ref 38–126)
Anion gap: 8 (ref 5–15)
BUN: 16 mg/dL (ref 8–23)
CO2: 25 mmol/L (ref 22–32)
Calcium: 9 mg/dL (ref 8.9–10.3)
Chloride: 104 mmol/L (ref 98–111)
Creatinine, Ser: 0.87 mg/dL (ref 0.61–1.24)
GFR, Estimated: >60 mL/min (ref >60)
Glucose, Bld: 123 mg/dL — ABNORMAL HIGH (ref 70–99)
Potassium: 3.8 mmol/L (ref 3.5–5.1)
Sodium: 137 mmol/L (ref 135–145)
Total Bilirubin: 0.6 mg/dL (ref 0.3–1.2)
Total Protein: 7.7 g/dL (ref 6.5–8.1)

## 2022-08-31 LAB — PROTIME-INR
INR: 1.1 (ref 0.8–1.2)
Prothrombin Time: 13.6 seconds (ref 11.4–15.2)

## 2022-08-31 LAB — RESP PANEL BY RT-PCR (RSV, FLU A&B, COVID)  RVPGX2
Influenza A by PCR: NEGATIVE
Influenza B by PCR: NEGATIVE
Resp Syncytial Virus by PCR: NEGATIVE
SARS Coronavirus 2 by RT PCR: NEGATIVE

## 2022-08-31 LAB — LACTIC ACID, PLASMA: Lactic Acid, Venous: 0.7 mmol/L (ref 0.5–1.9)

## 2022-08-31 LAB — APTT: aPTT: 30 seconds (ref 24–36)

## 2022-08-31 NOTE — Assessment & Plan Note (Signed)
--  initially diagnosed in 2017  -He was found to have high-grade urothelial carcinoma without muscle invasion and developed and subsequently BCG refractory disease in December 2021. He received Intravesicular gemcitabine with a total of 2000 mg weekly X6 started in February 2022  -f/u with urologist Dr. Diona Fanti  -bladder biopsies, bladder washings on 06/24/2022 showed focal urothelial carcinoma in situ (CIS) and cytology was positive for high grade urothelial carcinoma -I recommended intravesical gemcitabine 2000 mg weekly x 6, he started on 08/28/16

## 2022-08-31 NOTE — Assessment & Plan Note (Signed)
-  Given his first intravesical gemcitabine therapy today, he experienced with bladder cramps which improved with oxybutynin -He had multiple issues today, nurse had to flush the catheter with normal saline and use bladder scan to check his urine in the bladder -He subsequently developed a significant urethral bleeding with blood clots, the catheter was removed -He was able to void afterwards, plan to discharge him home with close monitoring. -He knows to call us back if he has recurrent bleeding.

## 2022-08-31 NOTE — ED Triage Notes (Addendum)
Pt arrives today with intermittent fever. Pt had a fever of 101 on Thursday night. Friday AM, pt's temp was 102. Today, temp was 102.4. Pt has not taken any OTC meds today- on triage arrival , pt's temp is 98.8. Pt states cancer doctor sent him over for eval.  Last chemo 1/17. Pt states this chemo has made his gums sore.

## 2022-08-31 NOTE — ED Provider Notes (Signed)
Physical Exam  BP (!) 152/78   Pulse 79   Temp 98.8 F (37.1 C) (Oral)   Resp 18   Ht '5\' 9"'$  (1.753 m)   Wt 85.3 kg   SpO2 97%   BMI 27.76 kg/m   Physical Exam Vitals and nursing note reviewed.  Constitutional:      General: He is not in acute distress.    Appearance: He is well-developed.  HENT:     Head: Normocephalic and atraumatic.  Eyes:     Conjunctiva/sclera: Conjunctivae normal.  Cardiovascular:     Rate and Rhythm: Normal rate and regular rhythm.     Heart sounds: No murmur heard. Pulmonary:     Effort: Pulmonary effort is normal. No respiratory distress.     Breath sounds: Normal breath sounds. No wheezing, rhonchi or rales.  Abdominal:     Palpations: Abdomen is soft.     Tenderness: There is no abdominal tenderness.  Musculoskeletal:        General: No swelling.     Cervical back: Neck supple.  Skin:    General: Skin is warm and dry.     Capillary Refill: Capillary refill takes less than 2 seconds.  Neurological:     Mental Status: He is alert.  Psychiatric:        Mood and Affect: Mood normal.     Procedures  Procedures  ED Course / MDM   Clinical Course as of 08/31/22 1616  Sat Aug 31, 2022  1508 Reasses after labs. Hemeonc after [CR]  47 Consulted Dr. Burr Medico regarding the patient.  She was reassured by overall negative workup.  Plan is to reassess blood cultures on Monday with outpatient management of patient's symptoms.  Planned appointment on Thursday.  No further workup deemed necessary at this point in the ED. [CR]    Clinical Course User Index [CR] Wilnette Kales, PA   Medical Decision Making Amount and/or Complexity of Data Reviewed Labs: ordered. Radiology: ordered. ECG/medicine tests: ordered.   At shift change, patient care handed off from [facility], [place].  See prior note for full details.  In short, patient with history of bladder cancer currently undergoing intravesicular chemotherapy.  States that he had a fever earlier  today of 102.1 F taken orally.  Contacted oncologist Dr. Who told to come to the emergency department for further evaluation.  Patient reports 1 episode of hematuria which he has not noticed since.  Currently without abdominal pain, nausea, vomiting, hematuria, dysuria, chest pain, shortness of breath, fever, cough, congestion.  States that his gums are sore earlier today but currently not experiencing said symptoms.  Upon my initial interview, patient requesting to be discharged.  Laboratory studies: No leukocytosis.  Mild evidence anemia with a hemoglobin of 12.4 of which is decreased from patient's prior studies within the year; repeat evaluation by primary care recommended.  Platelets within range.  No electrolyte abnormalities appreciated.  No transaminitis.  No renal dysfunction.  Respiratory viral panel negative.  UA significant for 30 protein, moderate hemoglobin negative for RBC, bacteria, leukocyte, nitrite, WBC.  Initial lactate 2.7.  PT/INR within normal range.  APTT within normal range.  Urine culture, blood culture pending.  Imaging studies: Chest x-ray significant for no acute cardiopulmonary abnormalities.  I independently reviewed send imaging studies, agreement with radiologist interpretation  EKG: Sinus rhythm with evidence of right bundle branch block LVH is not acutely changed from prior EKG performed.  Fever Vital signs within normal range and stable throughout  visit Laboratory/imaging studies significant for: See above Patient with evidence of fever while undergoing chemotherapy for bladder cancer.  Patient currently afebrile without antipyretic administration and without any complaint upon current review.  Discussed case with oncologist Dr. Burr Medico; she was reassured by overall negative workup.  Recommendation is no antibiotics at this time with outpatient follow-up on blood cultures and reassessment with upcoming outpatient visit.  Treatment plan discussed at length with the patient  and he acknowledged understanding was agreeable to said plan.  Worrisome signs and symptoms were discussed with the patient and the patient acknowledged understanding to return to emergency department if notice.  Patient stable upon discharge.   Wilnette Kales, [place] 08/31/22 1616    Tretha Sciara, MD 08/31/22 2205

## 2022-08-31 NOTE — Telephone Encounter (Signed)
Pt's wife called and reports intermittent fever since his intravesical chemotherapy last Wednesday.  He had a temperature 100.2 this morning, but it came down to normal gradually.  He had a similar episode a few days ago, he does have some mild sore, no diarrhea, no productive cough no chills.  Due to his recurrent fever, and possible mucositis, I recommend a ED visit for lab, and rule out serious infection.  Patient was reluctant to go to ED, after discussion he agreed to go.  We will follow-up on Monday.   Truitt Merle MD 08/31/2022

## 2022-08-31 NOTE — Discharge Instructions (Signed)
Note the workup today was overall reassuring.  As discussed, keep your upcoming appointment with Dr. Burr Medico.  Her plan is to call you Monday morning with results of blood cultures.  Please do not hesitate to return to emergency department if the worrisome signs and symptoms we discussed become apparent.

## 2022-08-31 NOTE — ED Provider Notes (Signed)
EMERGENCY DEPARTMENT AT [facility] Provider Note   CSN: [id number] Arrival date & time: 08/31/22  1324     History  Chief Complaint  Patient presents with   Fever    Keith [name] is a 82 y.o. male.  Patient has a history of bladder cancer, currently being treated with ongoing intravesical chemotherapy with gemcitabine --presents after being advised by their oncologist due to fever.  Patient had a temperature to 102.1 F at home.  He may have had some abdominal pain but does not remember, no current abdominal pain.  No URI symptoms including ear pain, runny nose, sore throat.  He denies cough, chest pain or shortness of breath.  No nausea, vomiting, or diarrhea.  States that his gums have been sore but are not bleeding.  He denies other sores or lesions around the urethra.  He has noted some clots being passed in the urine.  No other skin rashes.  Patient was reportedly reluctant to come to the emergency department today.  No known sick contacts.       Home Medications Prior to Admission medications   Medication Sig Start Date End Date Taking? Authorizing Provider  acetaminophen (TYLENOL) 500 MG tablet Take 1,000 mg by mouth every 8 (eight) hours as needed for moderate pain.    [provider]  amLODipine (NORVASC) 10 MG tablet Take 10 mg by mouth daily.    [provider]  Aspirin-Salicylamide-Caffeine (ARTHRITIS STRENGTH BC POWDER PO) Take 1 packet by mouth daily as needed (pain). Patient not taking: Reported on 08/28/2022    [provider]  atorvastatin (LIPITOR) 20 MG tablet Take 20 mg by mouth at bedtime.     [provider]  carteolol (OCUPRESS) 1 % ophthalmic solution Place 1 drop into the right eye every morning.    [provider]  cetirizine (ZYRTEC) 10 MG tablet Take 10 mg by mouth daily as needed for allergies.    [provider]  clotrimazole-betamethasone (LOTRISONE) lotion Apply 1  Application topically daily as needed (irritation).    [provider]  fluticasone (FLONASE) 50 MCG/ACT nasal spray Place 2 sprays into both nostrils daily as needed for allergies.     [provider]  glimepiride (AMARYL) 1 MG tablet Take 1 mg by mouth daily with breakfast.    [provider]  ibuprofen (ADVIL) 200 MG tablet Take 600 mg by mouth every 8 (eight) hours as needed for moderate pain. Patient not taking: Reported on 08/28/2022    [provider]  latanoprost (XALATAN) 0.005 % ophthalmic solution Place 1 drop into the right eye at bedtime.    [provider]  levothyroxine (SYNTHROID, LEVOTHROID) 100 MCG tablet Take 100 mcg by mouth daily before breakfast.     [provider]  losartan (COZAAR) 100 MG tablet Take 100 mg by mouth at bedtime.     [provider]  meloxicam (MOBIC) 15 MG tablet Take 15 mg by mouth daily.    [provider]  Misc Natural Products (COMPLETE PROSTATE HEALTH PO) Take 1 tablet by mouth 2 (two) times daily.    [provider]  omeprazole (PRILOSEC) 20 MG capsule Take 20 mg by mouth daily. 01/03/22   [provider]  oxybutynin (DITROPAN XL) 5 MG 24 hr tablet Take 1 tablet (5 mg total) by mouth once a week. Take 2 hours before scheduled chemo in our office 08/24/22   Truitt Merle, MD  sitaGLIPtin (JANUVIA) 100 MG  tablet Take 100 mg by mouth every morning.    [provider]  tamsulosin (FLOMAX) 0.4 MG CAPS capsule Take 0.4 mg by mouth every morning.     [provider]  vitamin B-12 (CYANOCOBALAMIN) 500 MCG tablet Take 500 mcg by mouth daily.    [provider]      Allergies    Patient has no known allergies.    Review of Systems   Review of Systems  Physical Exam Updated Vital Signs BP 123/66 (BP Location: Left Arm)   Pulse 79   Temp 98.8 F (37.1 C) (Oral)   Resp 16   Ht '5\' 9"'$  (1.753 m)   Wt 85.3 kg   SpO2 99%   BMI 27.76 kg/m    Physical Exam Vitals and nursing note reviewed.  Constitutional:      General: He is not in acute distress.    Appearance: He is well-developed.  HENT:     Head: Normocephalic and atraumatic.     Right Ear: External ear normal.     Left Ear: External ear normal.     Mouth/Throat:     Mouth: Mucous membranes are moist.  Eyes:     General:        Right eye: No discharge.        Left eye: No discharge.     Conjunctiva/sclera: Conjunctivae normal.  Cardiovascular:     Rate and Rhythm: Normal rate and regular rhythm.     Heart sounds: Normal heart sounds.  Pulmonary:     Effort: Pulmonary effort is normal.     Breath sounds: Normal breath sounds.  Abdominal:     Palpations: Abdomen is soft.     Tenderness: There is no abdominal tenderness. There is no guarding or rebound.  Musculoskeletal:     Cervical back: Normal range of motion and neck supple.  Skin:    General: Skin is warm and dry.  Neurological:     Mental Status: He is alert.     ED Results / Procedures / Treatments   Labs (all labs ordered are listed, but only abnormal results are displayed) Labs Reviewed  CBC WITH DIFFERENTIAL/PLATELET - Abnormal; Notable for the following components:      Result Value   RBC 4.08 (*)    Hemoglobin 12.4 (*)    HCT 37.8 (*)    All other components within normal limits  URINALYSIS, ROUTINE W REFLEX MICROSCOPIC - Abnormal; Notable for the following components:   Glucose, UA 150 (*)    Hgb urine dipstick MODERATE (*)    Protein, ur 30 (*)    All other components within normal limits  CULTURE, BLOOD (ROUTINE X 2)  CULTURE, BLOOD (ROUTINE X 2)  URINE CULTURE  RESP PANEL BY RT-PCR (RSV, FLU A&B, COVID)  RVPGX2  PROTIME-INR  APTT  LACTIC ACID, PLASMA  LACTIC ACID, PLASMA  COMPREHENSIVE METABOLIC PANEL    EKG None  Radiology No results found.  Procedures Procedures    Medications Ordered in ED Medications - No data to display  ED Course/ Medical Decision Making/  A&P Clinical Course as of 08/31/22 1511  Sat Aug 31, 2022  1508 Reasses after labs. Hemeonc after [CR]    Clinical Course User Index [CR] Wilnette Kales, [place]    Patient seen and examined. History obtained directly from patient and wife at bedside.  I have reviewed patient's recent oncology notes.  Labs/EKG: Ordered sepsis labs and workup.  Imaging: Ordered chest x-ray.  Medications/Fluids: None ordered.  Patient afebrile at time of arrival.  Most recent vital signs reviewed and are as follows: BP 123/66 (BP Location: Left Arm)   Pulse 79   Temp 98.8 F (37.1 C) (Oral)   Resp 16   Ht '5\' 9"'$  (1.753 m)   Wt 85.3 kg   SpO2 99%   BMI 27.76 kg/m   Initial impression: Fever in setting of chemotherapy.  Patient appears well, nontoxic on exam.  3:11 PM  Labs personally reviewed and interpreted including: CBC with normal white blood cell count, hemoglobin 12.4; PT/INR normal; APTT normal; UA without compelling signs of infection.  Imaging personally visualized and interpreted including: Chest x-ray, agree negative.  Reviewed pertinent lab work and imaging with patient at bedside. Questions answered.   Most current vital signs reviewed and are as follows: BP (!) 149/70   Pulse 79   Temp 98.8 F (37.1 C) (Oral)   Resp 16   Ht '5\' 9"'$  (1.753 m)   Wt 85.3 kg   SpO2 97%   BMI 27.76 kg/m   Plan: Awaiting completion of infectious workup. Unk Lightning PA-C aware of patient at shift change.  Plan follow-up on results, touch base with hematology oncology to ensure appropriate follow-up.    Click here for ABCD2, HEART and other calculatorsREFRESH Note before signing :1}                          Medical Decision Making Amount and/or Complexity of Data Reviewed Labs: ordered. Radiology: ordered. ECG/medicine tests: ordered.   Patient presents to the emergency department for fever in setting of active chemotherapy.  He is looks well, nontoxic.  Completing workup at this  time.        Final Clinical Impression(s) / ED Diagnoses Final diagnoses:  Fever, unspecified fever cause    Rx / DC Orders ED Discharge Orders     None         Carlisle Cater, PA-C 08/31/22 1513    Lacretia Leigh, MD 09/01/22 [phone]

## 2022-09-01 LAB — URINE CULTURE: Culture: NO GROWTH

## 2022-09-02 ENCOUNTER — Telehealth: Payer: Self-pay

## 2022-09-02 NOTE — Telephone Encounter (Signed)
Pt's spouse called stating that the pt is having elevated temps over the weekend.  Pt went to the ER on Saturday 08/31/2022 but spouse Keith [name] state the ER did not do anything.  Keith [name] stated pt's fevers are running from 100.4 to 101.5 over the weekend.  Keith [name] said pt is still having little specks of pink in his urine.  Pt's is now c/o burning when urinating now.  This RN looked at the UA and UC taken in the ER on 08/31/2022.  The UA was negative and the UC (2 day status) is also negative.  Asked Keith [name] if she contacted pt's Urologist.  Keith [name] stated "No" but will contact him today.  Informed Keith [name] that this RN will notify Dr. Burr Medico so she is aware of the situation.  Keith [name] verbalized understanding and had no further questions at this time.

## 2022-09-03 ENCOUNTER — Inpatient Hospital Stay (HOSPITAL_BASED_OUTPATIENT_CLINIC_OR_DEPARTMENT_OTHER): Payer: Medicare HMO | Admitting: Hematology

## 2022-09-03 ENCOUNTER — Other Ambulatory Visit: Payer: Self-pay | Admitting: Hematology

## 2022-09-03 DIAGNOSIS — C679 Malignant neoplasm of bladder, unspecified: Secondary | ICD-10-CM | POA: Diagnosis not present

## 2022-09-03 DIAGNOSIS — C678 Malignant neoplasm of overlapping sites of bladder: Secondary | ICD-10-CM | POA: Diagnosis not present

## 2022-09-03 DIAGNOSIS — R300 Dysuria: Secondary | ICD-10-CM | POA: Diagnosis not present

## 2022-09-03 NOTE — Assessment & Plan Note (Signed)
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 He received Intravesicular gemcitabine with a total of 2000 mg weekly X6 started in February 2022  -f/u with urologist Dr. Diona Fanti  -bladder biopsies, bladder washings on 06/24/2022 showed focal urothelial carcinoma in situ (CIS) and cytology was positive for high grade urothelial carcinoma -I recommended intravesical gemcitabine 2000 mg weekly x 6, he started on 08/28/16 -He unfortunately had a significant hematuria during the first treatment, and he developed recurrent fever afterwards. -Will hold on treatment this week to allow him to recover well -I discussed changing treatment to intravesical mitomycin, potential benefit and side effects discussed with him again.  He agrees to proceed

## 2022-09-03 NOTE — Progress Notes (Signed)
Keith [name]   [phone] [phone]   Clinic Follow up Note   Patient Care Team: Sueanne Margarita, DO as PCP - General (Internal Medicine) Truitt Merle, MD as Attending Physician (Hematology and Oncology)  Date of Service:  09/03/2022  I connected with Keith [name] on 09/03/2022 at  2:20 PM EST by telephone visit and verified that I am speaking with the correct person using two identifiers.  I discussed the limitations, risks, security and privacy concerns of performing an evaluation and management service by telephone and the availability of in person appointments. I also discussed with the patient that there may be a patient responsible charge related to this service. The patient expressed understanding and agreed to proceed.   Other persons participating in the visit and their role in the encounter:  Wife  Patient's location:  Home Provider's location:  Office  CHIEF COMPLAINT: f/u of bladder cramps and irregular bleeding   CURRENT THERAPY:  Intravesical gemcitabine weeklyX6    ASSESSMENT & PLAN:  Keith [name] is a 82 y.o. 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 He received Intravesicular gemcitabine with a total of 2000 mg weekly X6 started in February 2022  -f/u with urologist Dr. Diona Fanti  -bladder biopsies, bladder washings on 06/24/2022 showed focal urothelial carcinoma in situ (CIS) and cytology was positive for high grade urothelial carcinoma -I recommended intravesical gemcitabine 2000 mg weekly x 6, he started on 08/28/16 -He unfortunately had a significant hematuria during the first treatment, and he developed recurrent fever afterwards. -Will hold on treatment this week to allow him to recover well -I discussed changing treatment to intravesical mitomycin,  potential benefit and side effects discussed with him again.  He agrees to proceed  Plan:  -cancel chemo this week to allow him recover well  - Encourage the pt to drink plenty of water  -f/u with the Urologist today  -lab,f/u and Intravesical mitomycin 09/12/2022  SUMMARY OF ONCOLOGIC HISTORY: Oncology History  Bladder cancer ([place])  09/12/2020 Initial Diagnosis   Bladder cancer ([place])   09/12/2020 Cancer Staging   Staging form: Urinary Bladder, AJCC 8th Edition - Clinical: Stage I (cT1, cN0, cM0) - Signed by Wyatt Portela, MD on 09/12/2020 WHO/ISUP grade (low/high): High Grade Histologic grading system: 2 grade system   06/24/2022 Pathology Results             FINAL MICROSCOPIC DIAGNOSIS:  A. BLADDER, BIOPSY: Focal urothelial carcinoma in situ (CIS)     06/24/2022 Cancer Staging   Staging form: Urinary Bladder, AJCC 8th Edition - Pathologic stage from 06/24/2022: Stage 0is (pTis, pN0, cM0) - Signed by Truitt Merle, MD on 08/24/2022 Stage prefix: Initial diagnosis WHO/ISUP grade (low/high): High Grade Histologic grading system: 2 grade system   08/28/2022 -  Chemotherapy   Patient is on Treatment Plan : BLADDER Gemcitabine INTRAVESICAL (2000) q7d     Malignant neoplasm of trigone of urinary bladder ([place])  09/12/2020 Initial Diagnosis   Malignant neoplasm of trigone of urinary bladder ([place])   09/26/2020 - 10/31/2020 Chemotherapy   Patient is on Treatment Plan : BLADDER Gemcitabine q7d        INTERVAL HISTORY:  Keith [name] was contacted for a follow up of bladder cramps and irregular bleeding . He was last seen by me on 08/28/2022.  Pt has  been running a low garde fever, pp.3 right now , but has 100.4.Pt has some blood in urine,he states he goes to the bathroom 4-5 times in the night. He also has pain when he urinate.He states he is unable to do his daily activities. He also reports of having a blood clot in his urine.    All other systems were reviewed with the patient  and are negative.  MEDICAL HISTORY:  Past Medical History:  Diagnosis Date   Bladder cancer ([place])    Blind right eye    BPH (benign prostatic hypertrophy)    Cataracts, both eyes    Depression    DM type 2 (diabetes mellitus, type 2) (HCC)    DOE (dyspnea on exertion)    with heavy exertion   Full dentures    GERD (gastroesophageal reflux disease)    Glaucoma no peripheral or side vision   right eye blind   Glaucoma, right eye    HOH (hard of hearing)    Hyperlipidemia    Hypertension    Hypothyroidism    Local recurrence of cancer of urinary bladder ([place]) urologsit-  dr Diona Fanti   1st dx 01-29-2016--  now recurrent 05-31-2016   Lower extremity edema 07/11/2020   right swells more than left goes down at night after sleeping   Lower urinary tract symptoms (LUTS) 2019   Migraine    Peripheral neuropathy    middle finger left hand   Pneumonia 2019   Skin abnormality    base of spine looks like pimple has had since 07-01-2020, no drainage area intact   Wears glasses     SURGICAL HISTORY: Past Surgical History:  Procedure Laterality Date   ANTERIOR CERVICAL DECOMP/DISCECTOMY FUSION  08-15-2000   C5 -- C6   ANTERIOR CERVICAL DECOMP/DISCECTOMY FUSION  03-04-2008   C6 -- C7   areas removed from back at spine  2021 jan and aug 2021   CYSTOSCOPY W/ RETROGRADES Left 05/11/2020   Procedure: Cystoscopy with left retrograde ureteropyelogram, resection of probable bladder tumor and left trigonal/ureteral orifice region, 3 cm in size, fluoroscopic interpretation, left ureteroscopy, placement of 6 French by 26 cm contour double-J stent without tether;  Surgeon: Franchot Gallo, MD;  Location: [facility];  Service: Urology;  Laterality: Left;   CYSTOSCOPY W/ RETROGRADES Bilateral 06/24/2022   Procedure: CYSTOSCOPY WITH RETROGRADE PYELOGRAM;  Surgeon: Franchot Gallo, MD;  Location: [facility];  Service: Urology;  Laterality: Bilateral;  1 HR   CYSTOSCOPY WITH BIOPSY  N/A 01/29/2016   Procedure: CYSTOSCOPY WITH BIOPSY;  Surgeon: Franchot Gallo, MD;  [facility];  Service: Urology;  Laterality: N/A;   CYSTOSCOPY WITH BIOPSY N/A 05/31/2016   Procedure: CYSTOSCOPY WITH BIOPSY;  Surgeon: Franchot Gallo, MD;  Location: [facility];  Service: Urology;  Laterality: N/A;   CYSTOSCOPY WITH BIOPSY N/A 06/24/2022   Procedure: CYSTOSCOPY WITH BIOPSY;  Surgeon: Franchot Gallo, MD;  Location: [facility];  Service: Urology;  Laterality: N/A;   EYE SURGERY Right [place] and 2000   lower back  06/01/2019, 02-10-2020   nose cancer  areas removed  2019, 2021   x 2 / 3/ 2021 small area removed from nose   surgery for sacral fracture  40 yrs ago   TRANSURETHRAL RESECTION OF BLADDER TUMOR N/A 07/17/2020   Procedure: TRANSURETHRAL RESECTION OF BLADDER TUMOR (TURBT);  Surgeon: Franchot Gallo, MD;  Location[facility];  Service: Urology;  Laterality: N/A;    I have reviewed the social history and family history with the patient and they are unchanged from previous note.  ALLERGIES:  has No Known Allergies.  MEDICATIONS:  Current Outpatient Medications  Medication Sig Dispense Refill   acetaminophen (TYLENOL) 500 MG tablet Take 1,000 mg by mouth every 8 (eight) hours as needed for moderate pain.     amLODipine (NORVASC) 10 MG tablet Take 10 mg by mouth daily.     Aspirin-Salicylamide-Caffeine (ARTHRITIS STRENGTH BC POWDER PO) Take 1 packet by mouth daily as needed (pain). (Patient not taking: Reported on 08/28/2022)     atorvastatin (LIPITOR) 20 MG tablet Take 20 mg by mouth at bedtime.      carteolol (OCUPRESS) 1 % ophthalmic solution Place 1 drop into the right eye every morning.     cetirizine (ZYRTEC) 10 MG tablet Take 10 mg by mouth daily as needed for allergies.     clotrimazole-betamethasone (LOTRISONE) lotion Apply 1 Application topically daily as needed (irritation).      fluticasone (FLONASE) 50 MCG/ACT nasal spray Place 2 sprays into both nostrils daily as needed for allergies.      glimepiride (AMARYL) 1 MG tablet Take 1 mg by mouth daily with breakfast.     ibuprofen (ADVIL) 200 MG tablet Take 600 mg by mouth every 8 (eight) hours as needed for moderate pain. (Patient not taking: Reported on 08/28/2022)     latanoprost (XALATAN) 0.005 % ophthalmic solution Place 1 drop into the right eye at bedtime.     levothyroxine (SYNTHROID, LEVOTHROID) 100 MCG tablet Take 100 mcg by mouth daily before breakfast.      losartan (COZAAR) 100 MG tablet Take 100 mg by mouth at bedtime.      meloxicam (MOBIC) 15 MG tablet Take 15 mg by mouth daily.     Misc Natural Products (COMPLETE PROSTATE HEALTH PO) Take 1 tablet by mouth 2 (two) times daily.     omeprazole (PRILOSEC) 20 MG capsule Take 20 mg by mouth daily.     oxybutynin (DITROPAN XL) 5 MG 24 hr tablet Take 1 tablet (5 mg total) by mouth once a week. Take 2 hours before scheduled chemo in our office 10 tablet 0   sitaGLIPtin (JANUVIA) 100 MG tablet Take 100 mg by mouth every morning.     tamsulosin (FLOMAX) 0.4 MG CAPS capsule Take 0.4 mg by mouth every morning.      vitamin B-12 (CYANOCOBALAMIN) 500 MCG tablet Take 500 mcg by mouth daily.     No current facility-administered medications for this visit.    PHYSICAL EXAMINATION: ECOG PERFORMANCE STATUS: 1 - Symptomatic but completely ambulatory  There were no vitals filed for this visit. Wt Readings from Last 3 Encounters:  08/31/22 188 lb (85.3 kg)  08/21/22 188 lb 6.4 oz (85.5 kg)  06/24/22 188 lb 0.8 oz (85.3 kg)     No vitals taken today, Exam not performed today  LABORATORY DATA:  I have reviewed the data as listed    Latest Ref Rng & Units 08/31/2022    2:23 PM 08/28/2022   11:25 AM 06/20/2022    8:47 AM  CBC  WBC 4.0 - 10.5 K/uL 4.9  6.0  5.4   Hemoglobin 13.0 - 17.0 g/dL 12.4  13.9  13.3   Hematocrit 39.0 - 52.0 % 37.8  39.4  38.9   Platelets  150 - 400 K/uL 190  203  185  Latest Ref Rng & Units 08/31/2022    2:23 PM 08/28/2022   11:25 AM 06/20/2022    8:47 AM  CMP  Glucose 70 - 99 mg/dL 123  176  202   BUN 8 - 23 mg/dL '16  13  16   '$ Creatinine 0.61 - 1.24 mg/dL 0.87  0.92  0.80   Sodium 135 - 145 mmol/L 137  136  139   Potassium 3.5 - 5.1 mmol/L 3.8  3.7  4.4   Chloride 98 - 111 mmol/L 104  103  106   CO2 22 - 32 mmol/L '25  27  27   '$ Calcium 8.9 - 10.3 mg/dL 9.0  9.4  9.3   Total Protein 6.5 - 8.1 g/dL 7.7  7.2    Total Bilirubin 0.3 - 1.2 mg/dL 0.6  0.4    Alkaline Phos 38 - 126 U/L 50  62    AST 15 - 41 U/L 24  19    ALT 0 - 44 U/L 22  18        RADIOGRAPHIC STUDIES: I have personally reviewed the radiological images as listed and agreed with the findings in the report. No results found.    No orders of the defined types were placed in this encounter.  All questions were answered. The patient knows to call the clinic with any problems, questions or concerns. No barriers to learning was detected. The total time spent in the appointment was 22 minutes.     Truitt Merle, MD 09/03/2022   Felicity Coyer am acting as scribe for Truitt Merle, MD.   I have reviewed the above documentation for accuracy and completeness, and I agree with the above.

## 2022-09-05 ENCOUNTER — Ambulatory Visit: Payer: Medicare HMO

## 2022-09-05 LAB — CULTURE, BLOOD (ROUTINE X 2)
Culture: NO GROWTH
Culture: NO GROWTH
Special Requests: ADEQUATE
Special Requests: ADEQUATE

## 2022-09-09 NOTE — Progress Notes (Unsigned)
Patient Care Team: Sueanne Margarita, DO as PCP - General (Internal Medicine) Truitt Merle, MD as Attending Physician (Hematology and Oncology)   CHIEF COMPLAINT: Follow-up bladder cancer  CURRENT THERAPY:  intravesical gemcitabine 2000 mg weekly x 6 starting 08/28/2022, held after cycle 1 and changed to intravesicular mitomycin starting 09/12/2022 (due to cramps and hematuria)  INTERVAL HISTORY Keith [name] returns for follow up and chemo as scheduled. He began the current plan of weekly gemcitabine x6 on 1/17 but developed hematuria, cramps, and fever after cycle 1. He spoke with Dr. Burr Medico 1/23 who recommended to change to mitomycin.   ROS   Past Medical History:  Diagnosis Date   Bladder cancer ([place])    Blind right eye    BPH (benign prostatic hypertrophy)    Cataracts, both eyes    Depression    DM type 2 (diabetes mellitus, type 2) (HCC)    DOE (dyspnea on exertion)    with heavy exertion   Full dentures    GERD (gastroesophageal reflux disease)    Glaucoma no peripheral or side vision   right eye blind   Glaucoma, right eye    HOH (hard of hearing)    Hyperlipidemia    Hypertension    Hypothyroidism    Local recurrence of cancer of urinary bladder ([place]) urologsit-  dr Diona Fanti   1st dx 01-29-2016--  now recurrent 05-31-2016   Lower extremity edema 07/11/2020   right swells more than left goes down at night after sleeping   Lower urinary tract symptoms (LUTS) 2019   Migraine    Peripheral neuropathy    middle finger left hand   Pneumonia 2019   Skin abnormality    base of spine looks like pimple has had since 07-01-2020, no drainage area intact   Wears glasses      Past Surgical History:  Procedure Laterality Date   ANTERIOR CERVICAL DECOMP/DISCECTOMY FUSION  08-15-2000   C5 -- C6   ANTERIOR CERVICAL DECOMP/DISCECTOMY FUSION  03-04-2008   C6 -- C7   areas removed from back at spine  2021 jan and aug 2021   CYSTOSCOPY W/ RETROGRADES Left 05/11/2020   Procedure:  Cystoscopy with left retrograde ureteropyelogram, resection of probable bladder tumor and left trigonal/ureteral orifice region, 3 cm in size, fluoroscopic interpretation, left ureteroscopy, placement of 6 French by 26 cm contour double-J stent without tether;  Surgeon: Franchot Gallo, MD;  Location: [facility];  Service: Urology;  Laterality: Left;   CYSTOSCOPY W/ RETROGRADES Bilateral 06/24/2022   Procedure: CYSTOSCOPY WITH RETROGRADE PYELOGRAM;  Surgeon: Franchot Gallo, MD;  Location: [facility];  Service: Urology;  Laterality: Bilateral;  1 HR   CYSTOSCOPY WITH BIOPSY N/A 01/29/2016   Procedure: CYSTOSCOPY WITH BIOPSY;  Surgeon: Franchot Gallo, MD;  Location: [facility];  Service: Urology;  Laterality: N/A;   CYSTOSCOPY WITH BIOPSY N/A 05/31/2016   Procedure: CYSTOSCOPY WITH BIOPSY;  Surgeon: Franchot Gallo, MD;  Location: [facility];  Service: Urology;  Laterality: N/A;   CYSTOSCOPY WITH BIOPSY N/A 06/24/2022   Procedure: CYSTOSCOPY WITH BIOPSY;  Surgeon: Franchot Gallo, MD;  Location: [facility];  Service: Urology;  Laterality: N/A;   EYE SURGERY Right [place] and 2000   lower back  06/01/2019, 02-10-2020   nose cancer  areas removed  2019, 2021   x 2 / 3/ 2021 small area removed from nose   surgery for sacral fracture  40 yrs ago   TRANSURETHRAL RESECTION OF BLADDER TUMOR N/A 07/17/2020   Procedure: TRANSURETHRAL RESECTION OF BLADDER TUMOR (TURBT);  Surgeon: Franchot Gallo, MD;  Location[facility];  Service: Urology;  Laterality: N/A;     Outpatient Encounter Medications as of 09/12/2022  Medication Sig   acetaminophen (TYLENOL) 500 MG tablet Take 1,000 mg by mouth every 8 (eight) hours as needed for moderate pain.   amLODipine (NORVASC) 10 MG tablet Take 10 mg by mouth daily.   Aspirin-Salicylamide-Caffeine (ARTHRITIS STRENGTH BC POWDER PO) Take 1 packet by mouth daily as  needed (pain). (Patient not taking: Reported on 08/28/2022)   atorvastatin (LIPITOR) 20 MG tablet Take 20 mg by mouth at bedtime.    carteolol (OCUPRESS) 1 % ophthalmic solution Place 1 drop into the right eye every morning.   cetirizine (ZYRTEC) 10 MG tablet Take 10 mg by mouth daily as needed for allergies.   clotrimazole-betamethasone (LOTRISONE) lotion Apply 1 Application topically daily as needed (irritation).   fluticasone (FLONASE) 50 MCG/ACT nasal spray Place 2 sprays into both nostrils daily as needed for allergies.    glimepiride (AMARYL) 1 MG tablet Take 1 mg by mouth daily with breakfast.   ibuprofen (ADVIL) 200 MG tablet Take 600 mg by mouth every 8 (eight) hours as needed for moderate pain. (Patient not taking: Reported on 08/28/2022)   latanoprost (XALATAN) 0.005 % ophthalmic solution Place 1 drop into the right eye at bedtime.   levothyroxine (SYNTHROID, LEVOTHROID) 100 MCG tablet Take 100 mcg by mouth daily before breakfast.    losartan (COZAAR) 100 MG tablet Take 100 mg by mouth at bedtime.    meloxicam (MOBIC) 15 MG tablet Take 15 mg by mouth daily.   Misc Natural Products (COMPLETE PROSTATE HEALTH PO) Take 1 tablet by mouth 2 (two) times daily.   omeprazole (PRILOSEC) 20 MG capsule Take 20 mg by mouth daily.   oxybutynin (DITROPAN XL) 5 MG 24 hr tablet Take 1 tablet (5 mg total) by mouth once a week. Take 2 hours before scheduled chemo in our office   sitaGLIPtin (JANUVIA) 100 MG tablet Take 100 mg by mouth every morning.   tamsulosin (FLOMAX) 0.4 MG CAPS capsule Take 0.4 mg by mouth every morning.    vitamin B-12 (CYANOCOBALAMIN) 500 MCG tablet Take 500 mcg by mouth daily.   No facility-administered encounter medications on file as of 09/12/2022.     There were no vitals filed for this visit. There is no height or weight on file to calculate BMI.   PHYSICAL EXAM GENERAL:alert, no distress and comfortable SKIN: no rash  EYES: sclera clear NECK: without mass LYMPH:  no  palpable cervical or supraclavicular lymphadenopathy  LUNGS: clear with normal breathing effort HEART: regular rate & rhythm, no lower extremity edema ABDOMEN: abdomen soft, non-tender and normal bowel sounds NEURO: alert & oriented x 3 with fluent speech, no focal motor/sensory deficits Breast exam:  PAC without erythema    CBC    Component Value Date/Time   WBC 4.9 08/31/2022 1423   RBC 4.08 (L) 08/31/2022 1423   HGB 12.4 (L) 08/31/2022 1423   HGB 13.9 08/28/2022 1125   HCT 37.8 (L) 08/31/2022 1423   PLT 190 08/31/2022 1423   PLT 203 08/28/2022 1125   MCV 92.6 08/31/2022 1423   MCH 30.4 08/31/2022 1423   MCHC 32.8 08/31/2022 1423   RDW 12.2 08/31/2022 1423   LYMPHSABS 1.1 08/31/2022 1423   MONOABS 0.2 08/31/2022 1423   EOSABS 0.3  08/31/2022 1423   BASOSABS 0.0 08/31/2022 1423     CMP     Component Value Date/Time   NA 137 08/31/2022 1423   K 3.8 08/31/2022 1423   CL 104 08/31/2022 1423   CO2 25 08/31/2022 1423   GLUCOSE 123 (H) 08/31/2022 1423   BUN 16 08/31/2022 1423   CREATININE 0.87 08/31/2022 1423   CREATININE 0.92 08/28/2022 1125   CALCIUM 9.0 08/31/2022 1423   PROT 7.7 08/31/2022 1423   ALBUMIN 4.0 08/31/2022 1423   AST 24 08/31/2022 1423   AST 19 08/28/2022 1125   ALT 22 08/31/2022 1423   ALT 18 08/28/2022 1125   ALKPHOS 50 08/31/2022 1423   BILITOT 0.6 08/31/2022 1423   BILITOT 0.4 08/28/2022 1125   GFRNONAA >60 08/31/2022 1423   GFRNONAA >60 08/28/2022 1125   GFRAA >60 05/11/2020 1300     ASSESSMENT & PLAN:  PLAN:  No orders of the defined types were placed in this encounter.     All questions were answered. The patient knows to call the clinic with any problems, questions or concerns. No barriers to learning were detected. I spent *** counseling the patient face to face. The total time spent in the appointment was *** and more than 50% was on counseling, review of test results, and coordination of care.   Cira Rue, NP-C '@DATE'$ @

## 2022-09-10 ENCOUNTER — Telehealth: Payer: Self-pay

## 2022-09-10 ENCOUNTER — Other Ambulatory Visit: Payer: Self-pay

## 2022-09-10 NOTE — Telephone Encounter (Signed)
Called the office of DR. Franchot Gallo at [facility] to get the patient last office note faxed over to [place] for Dr. Burr Medico. Office visit was 09/03/2022, spoke with Larkin Ina in Medical records and he faxed it over to me. Gave copy to Dr. Burr Medico and placed a copy in scan.

## 2022-09-11 ENCOUNTER — Other Ambulatory Visit: Payer: Self-pay | Admitting: Hematology

## 2022-09-11 ENCOUNTER — Other Ambulatory Visit: Payer: Self-pay | Admitting: Nurse Practitioner

## 2022-09-11 ENCOUNTER — Telehealth: Payer: Self-pay

## 2022-09-11 ENCOUNTER — Other Ambulatory Visit: Payer: Self-pay

## 2022-09-11 DIAGNOSIS — C670 Malignant neoplasm of trigone of bladder: Secondary | ICD-10-CM

## 2022-09-11 DIAGNOSIS — C671 Malignant neoplasm of dome of bladder: Secondary | ICD-10-CM

## 2022-09-11 DIAGNOSIS — C679 Malignant neoplasm of bladder, unspecified: Secondary | ICD-10-CM

## 2022-09-11 MED ORDER — SODIUM BICARBONATE 650 MG PO TABS: 650.0000 mg | ORAL_TABLET | ORAL | 1 refills | Status: AC

## 2022-09-11 MED ORDER — SODIUM BICARBONATE 650 MG PO TABS: 650.0000 mg | ORAL_TABLET | Freq: Two times a day (BID) | ORAL | 1 refills | Status: DC

## 2022-09-11 NOTE — Assessment & Plan Note (Signed)
--  initially diagnosed in 2017  -He was found to have high-grade urothelial carcinoma without muscle invasion and developed and subsequently BCG refractory disease in December 2021. He received Intravesicular gemcitabine with a total of 2000 mg weekly X6 started in February 2022  -f/u with urologist Dr. Diona Fanti  -bladder biopsies, bladder washings on 06/24/2022 showed focal urothelial carcinoma in situ (CIS) and cytology was positive for high grade urothelial carcinoma -I recommended intravesical gemcitabine 2000 mg weekly x 6, he started on 08/28/16.  He tolerated first cycle poorly with significant hematuria, and intermittent fever for week after treatment. -Due to his poor tolerance to intravesical gemcitabine, I recommended change therapy to intravesical mitomycin 40 mg every week for 5 weeks.  Potential benefit and side effect were discussed with him again, he agrees to try.

## 2022-09-11 NOTE — Telephone Encounter (Signed)
Spoke with pt and wife Wells Guiles) via telephone regarding pt's new intravesc bladder treatment.  Informed pt and wife that Dr. Burr Medico has changed pt's chemo to Mitomycin which he will get wkly for 5 wks.  Explained to both pt and wife this change was d/t pt's last tx with Gemcitabine and his reaction with elevated temperatures.  Pt and wife both verbalized understanding of explanation of treatment change.  Verbally gave pt and spouse instructions on prep for treatment day.  Instructed pt to purchase Sodium Bicarbonate '650mg'$  which can be bought over-the-counter from their local pharmacy.  Instructed the pt to take 2 pills ('1300mg'$ ) by mouth at bedtime the night before treatment, 2 pills ('1300mg'$ ) the morning of, and to take 2 pills ('1300mg'$ ) 30 mins prior receiving the mitomycin intravesc injection.  Informed no liquids by mouth except sips of water for medications 8 hours before his appt.  Pt's wife asked if pt is still to take Ditropan XL (Oxybutynin).  Informed pt's wife the Dr. Burr Medico still wants the pt to take Ditropan XL (Oxybutynin).  Pt and spouse verbalized understanding of instructions.  Pt will bring 2 pills with them to their tx on 09/12/2022 to take 30 mins before getting Mitomycin injection.

## 2022-09-12 ENCOUNTER — Inpatient Hospital Stay: Payer: Medicare HMO | Admitting: Hematology

## 2022-09-12 ENCOUNTER — Inpatient Hospital Stay: Payer: Medicare HMO

## 2022-09-12 ENCOUNTER — Inpatient Hospital Stay: Payer: Medicare HMO | Attending: Hematology

## 2022-09-12 ENCOUNTER — Other Ambulatory Visit: Payer: Self-pay

## 2022-09-12 ENCOUNTER — Encounter: Payer: Self-pay | Admitting: Hematology

## 2022-09-12 VITALS — BP 140/63 | HR 87 | Temp 99.0°F | Resp 18 | Ht 69.0 in | Wt 185.3 lb

## 2022-09-12 DIAGNOSIS — C679 Malignant neoplasm of bladder, unspecified: Secondary | ICD-10-CM

## 2022-09-12 DIAGNOSIS — C671 Malignant neoplasm of dome of bladder: Secondary | ICD-10-CM

## 2022-09-12 DIAGNOSIS — Z5111 Encounter for antineoplastic chemotherapy: Secondary | ICD-10-CM | POA: Insufficient documentation

## 2022-09-12 DIAGNOSIS — C670 Malignant neoplasm of trigone of bladder: Secondary | ICD-10-CM | POA: Diagnosis not present

## 2022-09-12 DIAGNOSIS — Z79899 Other long term (current) drug therapy: Secondary | ICD-10-CM | POA: Insufficient documentation

## 2022-09-12 LAB — CMP (CANCER CENTER ONLY)
ALT: 32 U/L (ref 0–44)
AST: 21 U/L (ref 15–41)
Albumin: 3.9 g/dL (ref 3.5–5.0)
Alkaline Phosphatase: 67 U/L (ref 38–126)
Anion gap: 6 (ref 5–15)
BUN: 14 mg/dL (ref 8–23)
CO2: 28 mmol/L (ref 22–32)
Calcium: 9.2 mg/dL (ref 8.9–10.3)
Chloride: 103 mmol/L (ref 98–111)
Creatinine: 0.81 mg/dL (ref 0.61–1.24)
GFR, Estimated: >60 mL/min (ref >60)
Glucose, Bld: 205 mg/dL — ABNORMAL HIGH (ref 70–99)
Potassium: 4 mmol/L (ref 3.5–5.1)
Sodium: 137 mmol/L (ref 135–145)
Total Bilirubin: 0.4 mg/dL (ref 0.3–1.2)
Total Protein: 7.2 g/dL (ref 6.5–8.1)

## 2022-09-12 LAB — CBC WITH DIFFERENTIAL (CANCER CENTER ONLY)
Abs Immature Granulocytes: 0.03 10*3/uL (ref 0.00–0.07)
Basophils Absolute: 0.1 10*3/uL (ref 0.0–0.1)
Basophils Relative: 1 %
Eosinophils Absolute: 0.4 10*3/uL (ref 0.0–0.5)
Eosinophils Relative: 7 %
HCT: 34.8 % — ABNORMAL LOW (ref 39.0–52.0)
Hemoglobin: 12.2 g/dL — ABNORMAL LOW (ref 13.0–17.0)
Immature Granulocytes: 1 %
Lymphocytes Relative: 20 %
Lymphs Abs: 1 10*3/uL (ref 0.7–4.0)
MCH: 31 pg (ref 26.0–34.0)
MCHC: 35.1 g/dL (ref 30.0–36.0)
MCV: 88.5 fL (ref 80.0–100.0)
Monocytes Absolute: 0.7 10*3/uL (ref 0.1–1.0)
Monocytes Relative: 13 %
Neutro Abs: 3.1 10*3/uL (ref 1.7–7.7)
Neutrophils Relative %: 58 %
Platelet Count: 232 10*3/uL (ref 150–400)
RBC: 3.93 MIL/uL — ABNORMAL LOW (ref 4.22–5.81)
RDW: 12.4 % (ref 11.5–15.5)
WBC Count: 5.2 10*3/uL (ref 4.0–10.5)
nRBC: 0 % (ref 0.0–0.2)

## 2022-09-12 MED ORDER — PROCHLORPERAZINE MALEATE 10 MG PO TABS
10.0000 mg | ORAL_TABLET | Freq: Once | ORAL | Status: AC
  Administered 2022-09-12: 10 mg via ORAL
  Filled 2022-09-12: qty 1

## 2022-09-12 MED ORDER — OXYBUTYNIN CHLORIDE 5 MG PO TABS
5.0000 mg | ORAL_TABLET | Freq: Once | ORAL | Status: AC | PRN
  Administered 2022-09-12: 5 mg via ORAL

## 2022-09-12 MED ORDER — LIDOCAINE HCL URETHRAL/MUCOSAL 2 % EX GEL
1.0000 | Freq: Once | CUTANEOUS | Status: AC
  Administered 2022-09-12: 1 via URETHRAL
  Filled 2022-09-12: qty 1

## 2022-09-12 MED ORDER — MITOMYCIN CHEMO FOR BLADDER INSTILLATION 40 MG
40.0000 mg | Freq: Once | INTRAVENOUS | Status: AC
  Administered 2022-09-12: 40 mg via INTRAVESICAL
  Filled 2022-09-12: qty 20

## 2022-09-12 NOTE — Progress Notes (Signed)
[facility]   Telephone:([phone]) [phone] Fax:([phone]) [phone]   Clinic Follow up Note   Patient Care Team: Sueanne Margarita, DO as PCP - General (Internal Medicine) Truitt Merle, MD as Attending Physician (Hematology and Oncology) Franchot Gallo, MD as Consulting Physician (Urology)  Date of Service:  09/12/2022  CHIEF COMPLAINT: f/u of bladder cramps and irregular bleeding   CURRENT THERAPY:  Intravesical Mytomycin 40 mg q wk for 5 wks   ASSESSMENT:  Keith [name] is a 82 y.o. male with   Bladder cancer ([place]) --initially diagnosed in 2017  -He was found to have high-grade urothelial carcinoma without muscle invasion and developed and subsequently BCG refractory disease in December 2021. He received Intravesicular gemcitabine with a total of 2000 mg weekly X6 started in February 2022  -f/u with urologist Dr. Diona Fanti  -bladder biopsies, bladder washings on 06/24/2022 showed focal urothelial carcinoma in situ (CIS) and cytology was positive for high grade urothelial carcinoma -I recommended intravesical gemcitabine 2000 mg weekly x 6, he started on 08/28/16.  He tolerated first cycle poorly with significant hematuria, and intermittent fever for week after treatment. -Due to his poor tolerance to intravesical gemcitabine, I recommended change therapy to intravesical mitomycin 40 mg every week for 5 weeks.  Potential benefit and side effect were discussed with him again, he agrees to try. -if he has poor tolerance, will consider pembrolizumab in future for recurrent disease    PLAN: - proceed with C1 Intravesical Mytomycin 40 mg , change due to poor tolerance to intravesical gemcitabine - lab/flush, and treatment in 09/18/22 and 2 weeks -f/u in 2 weeks  Addendum Pt tolerated intravesical mitomycin well initially, however he developed urinary leaking around the Foley 1 hour after and urge sensation to urinate.  Due to the persistent leaking, Foley was removed.  Otherwise  tolerated procedure well.    SUMMARY OF ONCOLOGIC HISTORY: Oncology History  Bladder cancer ([place])  09/12/2020 Initial Diagnosis   Bladder cancer ([place])   09/12/2020 Cancer Staging   Staging form: Urinary Bladder, AJCC 8th Edition - Clinical: Stage I (cT1, cN0, cM0) - Signed by Wyatt Portela, MD on 09/12/2020 WHO/ISUP grade (low/high): High Grade Histologic grading system: 2 grade system   06/24/2022 Pathology Results             FINAL MICROSCOPIC DIAGNOSIS:  A. BLADDER, BIOPSY: Focal urothelial carcinoma in situ (CIS)     06/24/2022 Cancer Staging   Staging form: Urinary Bladder, AJCC 8th Edition - Pathologic stage from 06/24/2022: Stage 0is (pTis, pN0, cM0) - Signed by Truitt Merle, MD on 08/24/2022 Stage prefix: Initial diagnosis WHO/ISUP grade (low/high): High Grade Histologic grading system: 2 grade system   08/28/2022 - 08/28/2022 Chemotherapy   Patient is on Treatment Plan : BLADDER Gemcitabine INTRAVESICAL (2000) q7d     09/12/2022 -  Chemotherapy   Patient is on Treatment Plan : BLADDER Mitomycin INTRAVESICAL (40 mg) q7d     Malignant neoplasm of trigone of urinary bladder ([place])  09/12/2020 Initial Diagnosis   Malignant neoplasm of trigone of urinary bladder ([place])   09/26/2020 - 10/31/2020 Chemotherapy   Patient is on Treatment Plan : BLADDER Gemcitabine q7d        INTERVAL HISTORY:  Keith [name] is here for a follow up of bladder cramps and irregular bleeding  He was last seen by me on 08/28/2022 He presents to the clinic accompanied by wife. Pt reports that he purchase AZO and it help with the burning sensation.Pt has been  running a low grade fever 99.0 no higher. Pt wife said activity was limited due to his pain.     All other systems were reviewed with the patient and are negative.  MEDICAL HISTORY:  Past Medical History:  Diagnosis Date   Bladder cancer ([place])    Blind right eye    BPH (benign prostatic hypertrophy)    Cataracts, both eyes     Depression    DM type 2 (diabetes mellitus, type 2) (HCC)    DOE (dyspnea on exertion)    with heavy exertion   Full dentures    GERD (gastroesophageal reflux disease)    Glaucoma no peripheral or side vision   right eye blind   Glaucoma, right eye    HOH (hard of hearing)    Hyperlipidemia    Hypertension    Hypothyroidism    Local recurrence of cancer of urinary bladder ([place]) urologsit-  dr Diona Fanti   1st dx 01-29-2016--  now recurrent 05-31-2016   Lower extremity edema 07/11/2020   right swells more than left goes down at night after sleeping   Lower urinary tract symptoms (LUTS) 2019   Migraine    Peripheral neuropathy    middle finger left hand   Pneumonia 2019   Skin abnormality    base of spine looks like pimple has had since 07-01-2020, no drainage area intact   Wears glasses     SURGICAL HISTORY: Past Surgical History:  Procedure Laterality Date   ANTERIOR CERVICAL DECOMP/DISCECTOMY FUSION  08-15-2000   C5 -- C6   ANTERIOR CERVICAL DECOMP/DISCECTOMY FUSION  03-04-2008   C6 -- C7   areas removed from back at spine  2021 jan and aug 2021   CYSTOSCOPY W/ RETROGRADES Left 05/11/2020   Procedure: Cystoscopy with left retrograde ureteropyelogram, resection of probable bladder tumor and left trigonal/ureteral orifice region, 3 cm in size, fluoroscopic interpretation, left ureteroscopy, placement of 6 French by 26 cm contour double-J stent without tether;  Surgeon: Franchot Gallo, MD;  Location: [facility];  Service: Urology;  Laterality: Left;   CYSTOSCOPY W/ RETROGRADES Bilateral 06/24/2022   Procedure: CYSTOSCOPY WITH RETROGRADE PYELOGRAM;  Surgeon: Franchot Gallo, MD;  Location: [facility];  Service: Urology;  Laterality: Bilateral;  1 HR   CYSTOSCOPY WITH BIOPSY N/A 01/29/2016   Procedure: CYSTOSCOPY WITH BIOPSY;  Surgeon: Franchot Gallo, MD;  Location: [facility];  Service: Urology;  Laterality: N/A;   CYSTOSCOPY WITH BIOPSY N/A  05/31/2016   Procedure: CYSTOSCOPY WITH BIOPSY;  Surgeon: Franchot Gallo, MD;  Location: [facility];  Service: Urology;  Laterality: N/A;   CYSTOSCOPY WITH BIOPSY N/A 06/24/2022   Procedure: CYSTOSCOPY WITH BIOPSY;  Surgeon: Franchot Gallo, MD;  Location: [facility];  Service: Urology;  Laterality: N/A;   EYE SURGERY Right [place] and 2000   lower back  06/01/2019, 02-10-2020   nose cancer  areas removed  2019, 2021   x 2 / 3/ 2021 small area removed from nose   surgery for sacral fracture  40 yrs ago   TRANSURETHRAL RESECTION OF BLADDER TUMOR N/A 07/17/2020   Procedure: TRANSURETHRAL RESECTION OF BLADDER TUMOR (TURBT);  Surgeon: Franchot Gallo, MD;  Location: [facility];  Service: Urology;  Laterality: N/A;    I have reviewed the social history and family history with the patient and they are unchanged from previous note.  ALLERGIES:  has No Known Allergies.  MEDICATIONS:  Current Outpatient Medications  Medication Sig Dispense Refill   acetaminophen (TYLENOL) 500 MG tablet Take 1,000 mg by mouth every 8 (eight) hours as needed for moderate pain.     amLODipine (NORVASC) 10 MG tablet Take 10 mg by mouth daily.     Aspirin-Salicylamide-Caffeine (ARTHRITIS STRENGTH BC POWDER PO) Take 1 packet by mouth daily as needed (pain). (Patient not taking: Reported on 08/28/2022)     atorvastatin (LIPITOR) 20 MG tablet Take 20 mg by mouth at bedtime.      carteolol (OCUPRESS) 1 % ophthalmic solution Place 1 drop into the right eye every morning.     cetirizine (ZYRTEC) 10 MG tablet Take 10 mg by mouth daily as needed for allergies.     clotrimazole-betamethasone (LOTRISONE) lotion Apply 1 Application topically daily as needed (irritation).     fluticasone (FLONASE) 50 MCG/ACT nasal spray Place 2 sprays into both nostrils daily as needed for allergies.      glimepiride (AMARYL) 1 MG tablet Take 1 mg by mouth daily with  breakfast.     ibuprofen (ADVIL) 200 MG tablet Take 600 mg by mouth every 8 (eight) hours as needed for moderate pain. (Patient not taking: Reported on 08/28/2022)     latanoprost (XALATAN) 0.005 % ophthalmic solution Place 1 drop into the right eye at bedtime.     levothyroxine (SYNTHROID, LEVOTHROID) 100 MCG tablet Take 100 mcg by mouth daily before breakfast.      losartan (COZAAR) 100 MG tablet Take 100 mg by mouth at bedtime.      meloxicam (MOBIC) 15 MG tablet Take 15 mg by mouth daily.     Misc Natural Products (COMPLETE PROSTATE HEALTH PO) Take 1 tablet by mouth 2 (two) times daily.     omeprazole (PRILOSEC) 20 MG capsule Take 20 mg by mouth daily.     oxybutynin (DITROPAN XL) 5 MG 24 hr tablet Take 1 tablet (5 mg total) by mouth once a week. Take 2 hours before scheduled chemo in our office 10 tablet 0   sitaGLIPtin (JANUVIA) 100 MG tablet Take 100 mg by mouth every morning.     sodium bicarbonate 650 MG tablet Take 1 tablet (650 mg total) by mouth See admin instructions. Take 2 pills ('1300mg'$ ) by mouth before bedtime the night before chemo treatment, take 2 pills the morning of chemo treatment, and take 2 pills ('1300mg'$ ) by mouth 30 minutes before bladder chemo injection 60 tablet 1   tamsulosin (FLOMAX) 0.4 MG CAPS capsule Take 0.4 mg by mouth every morning.      vitamin B-12 (CYANOCOBALAMIN) 500 MCG tablet Take 500 mcg by mouth daily.     No current facility-administered medications for this visit.    PHYSICAL EXAMINATION: ECOG PERFORMANCE STATUS: 1 - Symptomatic but completely ambulatory  Vitals:   09/12/22 0950  BP: (!) 140/63  Pulse: 87  Resp: 18  Temp: 99 F (37.2 C)  SpO2: 96%   Wt Readings from Last 3 Encounters:  09/12/22 185 lb 4.8 oz (84.1 kg)  08/31/22 188 lb (85.3 kg)  08/21/22 188 lb 6.4 oz (85.5 kg)     GENERAL:alert, no distress and comfortable SKIN: skin color normal, no rashes or significant lesions EYES: normal, Conjunctiva are pink and non-injected,  sclera clear  NEURO: alert & oriented x 3 with fluent speech  LABORATORY DATA:  I have reviewed the data as listed    Latest Ref Rng & Units 09/12/2022    9:20 AM 08/31/2022    2:23 PM  08/28/2022   11:25 AM  CBC  WBC 4.0 - 10.5 K/uL 5.2  4.9  6.0   Hemoglobin 13.0 - 17.0 g/dL 12.2  12.4  13.9   Hematocrit 39.0 - 52.0 % 34.8  37.8  39.4   Platelets 150 - 400 K/uL 232  190  203         Latest Ref Rng & Units 09/12/2022    9:20 AM 08/31/2022    2:23 PM 08/28/2022   11:25 AM  CMP  Glucose 70 - 99 mg/dL 205  123  176   BUN 8 - 23 mg/dL '14  16  13   '$ Creatinine 0.61 - 1.24 mg/dL 0.81  0.87  0.92   Sodium 135 - 145 mmol/L 137  137  136   Potassium 3.5 - 5.1 mmol/L 4.0  3.8  3.7   Chloride 98 - 111 mmol/L 103  104  103   CO2 22 - 32 mmol/L '28  25  27   '$ Calcium 8.9 - 10.3 mg/dL 9.2  9.0  9.4   Total Protein 6.5 - 8.1 g/dL 7.2  7.7  7.2   Total Bilirubin 0.3 - 1.2 mg/dL 0.4  0.6  0.4   Alkaline Phos 38 - 126 U/L 67  50  62   AST 15 - 41 U/L '21  24  19   '$ ALT 0 - 44 U/L 32  22  18       RADIOGRAPHIC STUDIES: I have personally reviewed the radiological images as listed and agreed with the findings in the report. No results found.    No orders of the defined types were placed in this encounter.  All questions were answered. The patient knows to call the clinic with any problems, questions or concerns. No barriers to learning was detected. The total time spent in the appointment was 30 minutes.     Truitt Merle, MD 09/12/2022   Felicity Coyer, CMA, am acting as scribe for Truitt Merle, MD.   I have reviewed the above documentation for accuracy and completeness, and I agree with the above.

## 2022-09-12 NOTE — Patient Instructions (Signed)
[facility]  Discharge Instructions: Thank you for choosing [facility] to provide your oncology and hematology care.   If you have a lab appointment with the [facility], please go directly to the [facility] and check in at the registration area.   Wear comfortable clothing and clothing appropriate for easy access to any Portacath or PICC line.   We strive to give you quality time with your provider. You may need to reschedule your appointment if you arrive late (15 or more minutes).  Arriving late affects you and other patients whose appointments are after yours.  Also, if you miss three or more appointments without notifying the office, you may be dismissed from the clinic at the provider's discretion.      For prescription refill requests, have your pharmacy contact our office and allow 72 hours for refills to be completed.    Today you received the following chemotherapy and/or immunotherapy agents: Mitomycin      To help prevent nausea and vomiting after your treatment, we encourage you to take your nausea medication as directed.  BELOW ARE SYMPTOMS THAT SHOULD BE REPORTED IMMEDIATELY: *FEVER GREATER THAN 100.4 F (38 C) OR HIGHER *CHILLS OR SWEATING *NAUSEA AND VOMITING THAT IS NOT CONTROLLED WITH YOUR NAUSEA MEDICATION *UNUSUAL SHORTNESS OF BREATH *UNUSUAL BRUISING OR BLEEDING *URINARY PROBLEMS (pain or burning when urinating, or frequent urination) *BOWEL PROBLEMS (unusual diarrhea, constipation, pain near the anus) TENDERNESS IN MOUTH AND THROAT WITH OR WITHOUT PRESENCE OF ULCERS (sore throat, sores in mouth, or a toothache) UNUSUAL RASH, SWELLING OR PAIN  UNUSUAL VAGINAL DISCHARGE OR ITCHING   Items with * indicate a potential emergency and should be followed up as soon as possible or go to the Emergency Department if any problems should occur.  Please show the CHEMOTHERAPY ALERT CARD or IMMUNOTHERAPY ALERT CARD at  check-in to the Emergency Department and triage nurse.  Should you have questions after your visit or need to cancel or reschedule your appointment, please contact [facility]  Dept: [phone]  and follow the prompts.  Office hours are 8:00 a.m. to 4:30 p.m. Monday - Friday. Please note that voicemails left after 4:00 p.m. may not be returned until the following business day.  We are closed weekends and major holidays. You have access to a nurse at all times for urgent questions. Please call the main number to the clinic Dept: [phone] and follow the prompts.   For any non-urgent questions, you may also contact your provider using MyChart. We now offer e-Visits for anyone 61 and older to request care online for non-urgent symptoms. For details visit [email or web address].   Also download the MyChart app! Go to the app store, search "MyChart", open the app, select [facility], and log in with your MyChart username and password.  Mitomycin Bladder or Kidney Solution What is this medication? MITOMYCIN (mye toe MYE sin) treats bladder cancer and kidney cancer. It works by slowing down the growth of cancer cells. This medicine may be used for other purposes; ask your health care provider or pharmacist if you have questions. COMMON BRAND NAME(S): JELMYTO What should I tell my care team before I take this medication? They need to know if you have any of these conditions: A hole or tear in your bladder or urinary tract Low blood counts, such as low white cells, platelets, red blood cells Swelling or narrowing of the tube that carries  urine from the kidney to the bladder An unusual or allergic reaction to mitomycin, other medications, foods, dyes, or preservatives Pregnant or trying to get pregnant Breastfeeding How should I use this medication? This medication is given as a catheter infusion into the kidney. It is given by your care team in a hospital or  clinic setting. Talk to your care team about the use of this medication in children. Special care may be needed. Overdosage: If you think you have taken too much of this medicine contact a poison control center or emergency room at once. NOTE: This medicine is only for you. Do not share this medicine with others. What if I miss a dose? Keep appointments for follow-up doses. It is important not to miss your dose. Call your care team if you are unable to keep an appointment. What may interact with this medication? Interactions are not expected. This list may not describe all possible interactions. Give your health care provider a list of all the medicines, herbs, non-prescription drugs, or dietary supplements you use. Also tell them if you smoke, drink alcohol, or use illegal drugs. Some items may interact with your medicine. What should I watch for while using this medication? Your condition will be monitored carefully while you are receiving this medication. Do not let urine touch your skin for at least 6 hours after treatment. Sit on the toilet when urinating. Flush the toilet several times after you use it. Wash your hands, inner thighs, and genital area with soap and water each time after going to the bathroom. If urine gets on your clothing, wash it right away. Wash it separately from other clothing. Talk to your care team if you may be pregnant. Serious birth defects can occur if you take this medication during pregnancy and for 6 months after the last dose. You will need a negative pregnancy test before starting this medication. Contraception is recommended while taking this medication and for 6 months after the last dose. Your care team can help you find the option that works for you. If your partner can get pregnant, use a condom during sex while taking this medication and for 3 months after the last dose. Do not breastfeed while taking this medication and for 1 week after the last dose. What  side effects may I notice from receiving this medication? Side effects that you should report to your care team as soon as possible: Allergic reactions--skin rash, itching, hives, swelling of the face, lips, tongue, or throat Infection--fever, chills, cough, sore throat, wounds that don't heal, pain or trouble when passing urine, general feeling of discomfort or being unwell Low red blood cell level--unusual weakness or fatigue, dizziness, headache, trouble breathing Trouble passing urine Unusual bruising or bleeding Side effects that usually do not require medical attention (report these to your care team if they continue or are bothersome): Blue urine Fatigue Loss of appetite Nausea Stomach pain This list may not describe all possible side effects. Call your doctor for medical advice about side effects. You may report side effects to FDA at 1-800-FDA-1088. Where should I keep my medication? This medication is given in a hospital or clinic. It will not be stored at home. NOTE: This sheet is a summary. It may not cover all possible information. If you have questions about this medicine, talk to your doctor, pharmacist, or health care provider.  2023 Elsevier/Gold Standard (2021-12-19 00:00:00)

## 2022-09-12 NOTE — Progress Notes (Addendum)
Pt to infusion today for first time intravesicular mitomycin. Pt reported taking home Ditropan and sodium bicarbonate prior to appt today. Pt tolerated 52f catheter insertion without issue. Drained 300cc urine before instilling chemo. Mitomycin instilled, catheter clamped. After an hour of dwell time, pt reported to nurse that he felt the urge to urinate. RN contacted pharmacy for PRN Ditropan. Pt repositioned and stated that he was no longer feeling the urge. After about 10 min, pt stated he was having the urge to urinate again, pt began to leak chemo around catheter. PRN Ditropan given. Dr FBurr Mediconotified and to bedside. Per Dr FBurr Medico drain bladder and bladder scan prior to discontinuing catheter. Bladder drained of 400cc, bladder scanned with 0cc remaining. Cathter removed without issue. Pt ambulatory to lobby upon discharge.

## 2022-09-13 ENCOUNTER — Telehealth: Payer: Self-pay

## 2022-09-13 NOTE — Telephone Encounter (Signed)
Keith [name] states that he is doing fine. He is eating, drinking and urinating well. He is emptying bladder completely at this time. No blood or clots noted in urine. He knows to call the office at [phone] if he has any questions or concerns.

## 2022-09-13 NOTE — Telephone Encounter (Signed)
-----   Message from Charleston Poot, RN sent at 09/12/2022  3:57 PM EST ----- Regarding: First time/ Mitomycin / Dr Burr Medico pt Hello,  Pt had first time intravesicular mytomycin today. Was only able to retain for an hour, see progress note. Tolerated well.  Thanks!

## 2022-09-17 ENCOUNTER — Other Ambulatory Visit: Payer: Self-pay

## 2022-09-17 ENCOUNTER — Other Ambulatory Visit: Payer: Self-pay | Admitting: Hematology

## 2022-09-18 ENCOUNTER — Inpatient Hospital Stay: Payer: Medicare HMO

## 2022-09-18 ENCOUNTER — Other Ambulatory Visit: Payer: Self-pay

## 2022-09-18 VITALS — BP 144/67 | HR 67 | Temp 98.3°F | Resp 12

## 2022-09-18 DIAGNOSIS — Z5111 Encounter for antineoplastic chemotherapy: Secondary | ICD-10-CM | POA: Diagnosis not present

## 2022-09-18 DIAGNOSIS — C671 Malignant neoplasm of dome of bladder: Secondary | ICD-10-CM

## 2022-09-18 DIAGNOSIS — Z79899 Other long term (current) drug therapy: Secondary | ICD-10-CM | POA: Diagnosis not present

## 2022-09-18 DIAGNOSIS — C670 Malignant neoplasm of trigone of bladder: Secondary | ICD-10-CM | POA: Diagnosis not present

## 2022-09-18 MED ORDER — MITOMYCIN CHEMO FOR BLADDER INSTILLATION 40 MG
40.0000 mg | Freq: Once | INTRAVENOUS | Status: AC
  Administered 2022-09-18: 40 mg via INTRAVESICAL
  Filled 2022-09-18: qty 40

## 2022-09-18 MED ORDER — PROCHLORPERAZINE MALEATE 10 MG PO TABS
10.0000 mg | ORAL_TABLET | Freq: Once | ORAL | Status: AC
  Administered 2022-09-18: 10 mg via ORAL
  Filled 2022-09-18: qty 1

## 2022-09-18 MED ORDER — LIDOCAINE HCL URETHRAL/MUCOSAL 2 % EX GEL
1.0000 | Freq: Once | CUTANEOUS | Status: AC
  Administered 2022-09-18: 1 via URETHRAL
  Filled 2022-09-18: qty 1

## 2022-09-18 MED ORDER — OXYBUTYNIN CHLORIDE 5 MG PO TABS
5.0000 mg | ORAL_TABLET | Freq: Once | ORAL | Status: AC | PRN
  Administered 2022-09-18: 5 mg via ORAL

## 2022-09-18 NOTE — Patient Instructions (Addendum)
[facility]  Discharge Instructions: Thank you for choosing [facility] to provide your oncology and hematology care.   If you have a lab appointment with the [facility], please go directly to the [facility] and check in at the registration area.   Wear comfortable clothing and clothing appropriate for easy access to any Portacath or PICC line.   We strive to give you quality time with your provider. You may need to reschedule your appointment if you arrive late (15 or more minutes).  Arriving late affects you and other patients whose appointments are after yours.  Also, if you miss three or more appointments without notifying the office, you may be dismissed from the clinic at the provider's discretion.      For prescription refill requests, have your pharmacy contact our office and allow 72 hours for refills to be completed.    Today you received the following chemotherapy and/or immunotherapy agents: Mitomycin      To help prevent nausea and vomiting after your treatment, we encourage you to take your nausea medication as directed.  BELOW ARE SYMPTOMS THAT SHOULD BE REPORTED IMMEDIATELY: *FEVER GREATER THAN 100.4 F (38 C) OR HIGHER *CHILLS OR SWEATING *NAUSEA AND VOMITING THAT IS NOT CONTROLLED WITH YOUR NAUSEA MEDICATION *UNUSUAL SHORTNESS OF BREATH *UNUSUAL BRUISING OR BLEEDING *URINARY PROBLEMS (pain or burning when urinating, or frequent urination) *BOWEL PROBLEMS (unusual diarrhea, constipation, pain near the anus) TENDERNESS IN MOUTH AND THROAT WITH OR WITHOUT PRESENCE OF ULCERS (sore throat, sores in mouth, or a toothache) UNUSUAL RASH, SWELLING OR PAIN  UNUSUAL VAGINAL DISCHARGE OR ITCHING   Items with * indicate a potential emergency and should be followed up as soon as possible or go to the Emergency Department if any problems should occur.  Please show the CHEMOTHERAPY ALERT CARD or IMMUNOTHERAPY ALERT CARD at  check-in to the Emergency Department and triage nurse.  Should you have questions after your visit or need to cancel or reschedule your appointment, please contact [facility]  Dept: [phone]  and follow the prompts.  Office hours are 8:00 a.m. to 4:30 p.m. Monday - Friday. Please note that voicemails left after 4:00 p.m. may not be returned until the following business day.  We are closed weekends and major holidays. You have access to a nurse at all times for urgent questions. Please call the main number to the clinic Dept: [phone] and follow the prompts.   For any non-urgent questions, you may also contact your provider using MyChart. We now offer e-Visits for anyone 21 and older to request care online for non-urgent symptoms. For details visit [email or web address].   Also download the MyChart app! Go to the app store, search "MyChart", open the app, select [facility], and log in with your MyChart username and password.

## 2022-09-18 NOTE — Progress Notes (Signed)
The foley catheter was inserted and then the compazine was administered. 30 minutes after the ditropan '5mg'$  was administered and then the mitomycin was administered slowly over 12 minutes. Patient tolearted well. He then rotated with assist for 30 minutes supine, 30 minutes on his right side, 30 minutes on his left side, then 30 minutes prone. Patient tolerated well. Prior to chemo instillation the patient had 313m urinary output via FC. Once the [facility] were [facility] the patient had 3019murinary output. Patient tolerated well. VSS. Patient ambulated to the lobby with his brother in law.

## 2022-09-24 NOTE — Progress Notes (Unsigned)
Keith [name]   [phone] [phone]   Clinic Follow up Note   Patient Care Team: Sueanne Margarita, DO as PCP - General (Internal Medicine) Truitt Merle, MD as Attending Physician (Hematology and Oncology) Franchot Gallo, MD as Consulting Physician (Urology)  Date of Service:  09/25/2022  CHIEF COMPLAINT: f/u of bladder cramps and irregular bleeding     CURRENT THERAPY:   Intravesical Mytomycin 40 mg q wk for 5 wks    ASSESSMENT:  Keith [name] is a 82 y.o. male with   Bladder cancer ([place]) --initially diagnosed in 2017  -He was found to have high-grade urothelial carcinoma without muscle invasion and developed and subsequently BCG refractory disease in December 2021. He received Intravesicular gemcitabine with a total of 2000 mg weekly X6 started in February 2022  -f/u with urologist Dr. Diona Fanti  -bladder biopsies, bladder washings on 06/24/2022 showed focal urothelial carcinoma in situ (CIS) and cytology was positive for high grade urothelial carcinoma -I recommended intravesical gemcitabine 2000 mg weekly x 6, he started on 08/28/16.  He tolerated first cycle poorly with significant hematuria, and intermittent fever for week after treatment. -Due to his poor tolerance to intravesical gemcitabine, I changed therapy to intravesical mitomycin 40 mg every week for 5 weeks. He tolerated the first two treatments overall well  -He is clinically doing well, did have mild intermittent low-grade fever after treatment last week, but overall tolerating well. -Lab reviewed, will proceed treatment today, he has 2 more cycles to complete.     PLAN: -lab reviewed. -proceed with C3 Intravesical Mitomycin 40 mg today and continue weekly for 2 more cycles -C4 Mitomycin in 1 week -Lab, follow-up in last cycle Mutamycin 2 weeks  SUMMARY OF ONCOLOGIC HISTORY: Oncology History  Bladder cancer ([facility])  09/12/2020 Initial Diagnosis   Bladder cancer ([place])    09/12/2020 Cancer Staging   Staging form: Urinary Bladder, AJCC 8th Edition - Clinical: Stage I (cT1, cN0, cM0) - Signed by Wyatt Portela, MD on 09/12/2020 WHO/ISUP grade (low/high): High Grade Histologic grading system: 2 grade system   06/24/2022 Pathology Results             FINAL MICROSCOPIC DIAGNOSIS:  A. 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 Pt states he did very well last treatment. Pt wife states he had a fever 100.2  he takes Tylenol for it , but it comes in the evening after his walks. Pt states also that  the pt glucose has been low. Pt states he feels tired when he has a fever.    All other systems were reviewed with the patient and are negative.  MEDICAL HISTORY:  Past Medical History:  Diagnosis Date   Bladder cancer ([place])    Blind right eye    BPH (benign prostatic hypertrophy)    Cataracts, both eyes    Depression    DM type 2 (diabetes mellitus, type 2) (HCC)    DOE (dyspnea on exertion)     with heavy exertion   Full dentures    GERD (gastroesophageal reflux disease)    Glaucoma no peripheral or side vision   right eye blind   Glaucoma, right eye    HOH (hard of hearing)    Hyperlipidemia    Hypertension    Hypothyroidism    Local recurrence of cancer of urinary bladder ([place]) urologsit-  dr Diona Fanti   1st dx 01-29-2016--  now recurrent 05-31-2016   Lower extremity edema 07/11/2020   right swells more than left goes down at night after sleeping   Lower urinary tract symptoms (LUTS) 2019   Migraine    Peripheral neuropathy    middle finger left hand   Pneumonia 2019   Skin abnormality    base of spine looks like pimple has had since 07-01-2020, no drainage area intact   Wears glasses     SURGICAL HISTORY: Past Surgical History:  Procedure Laterality Date   ANTERIOR CERVICAL DECOMP/DISCECTOMY FUSION  08-15-2000   C5 -- C6   ANTERIOR CERVICAL DECOMP/DISCECTOMY FUSION  03-04-2008   C6 -- C7   areas removed from back at spine  2021 jan and aug 2021   CYSTOSCOPY W/ RETROGRADES Left 05/11/2020   Procedure: Cystoscopy with left retrograde ureteropyelogram, resection of probable bladder tumor and left trigonal/ureteral orifice region, 3 cm in size, fluoroscopic interpretation, left ureteroscopy, placement of 6 French by 26 cm contour double-J stent without tether;  Surgeon: Franchot Gallo, MD;  Location[facility];  Service: Urology;  Laterality: Left;   CYSTOSCOPY W/ RETROGRADES Bilateral 06/24/2022   Procedure: CYSTOSCOPY WITH RETROGRADE PYELOGRAM;  Surgeon: Franchot Gallo, MD;  Location: [facility];  Service: Urology;  Laterality: Bilateral;  1 HR   CYSTOSCOPY WITH BIOPSY N/A 01/29/2016   Procedure: CYSTOSCOPY WITH BIOPSY;  Surgeon: Franchot Gallo, MD;  Location[facility];  Service: Urology;  Laterality: N/A;   CYSTOSCOPY WITH BIOPSY N/A 05/31/2016   Procedure: CYSTOSCOPY WITH BIOPSY;  Surgeon: Franchot Gallo, MD;  Location:  [facility];  Service: Urology;  Laterality: N/A;   CYSTOSCOPY WITH BIOPSY N/A 06/24/2022   Procedure: CYSTOSCOPY WITH BIOPSY;  Surgeon: Franchot Gallo, MD;  Location: [facility];  Service: Urology;  Laterality: N/A;   EYE SURGERY Right [place] and 2000   lower back  06/01/2019, 02-10-2020   nose cancer  areas removed  2019, 2021   x 2 / 3/ 2021 small area removed from nose   surgery for sacral fracture  40 yrs ago   TRANSURETHRAL RESECTION OF BLADDER TUMOR N/A 07/17/2020   Procedure: TRANSURETHRAL RESECTION OF BLADDER TUMOR (TURBT);  Surgeon: Franchot Gallo, MD;  Location: [facility];  Service: Urology;  Laterality: N/A;    I have reviewed the social history and family history with the patient and they are unchanged from previous note.  ALLERGIES:  has No Known Allergies.  MEDICATIONS:  Current Outpatient  Medications  Medication Sig Dispense Refill   acetaminophen (TYLENOL) 500 MG tablet Take 1,000 mg by mouth every 8 (eight) hours as needed for moderate pain.     amLODipine (NORVASC) 10 MG tablet Take 10 mg by mouth daily.     Aspirin-Salicylamide-Caffeine (ARTHRITIS STRENGTH BC POWDER PO) Take 1 packet by mouth daily as needed (pain). (Patient not taking: Reported on 08/28/2022)     atorvastatin (LIPITOR) 20 MG tablet Take 20 mg by mouth at bedtime.      carteolol (OCUPRESS) 1 % ophthalmic solution Place 1 drop into the right eye every morning.     cetirizine (ZYRTEC) 10 MG tablet Take 10 mg by mouth daily as needed for allergies.     clotrimazole-betamethasone (LOTRISONE) lotion Apply 1 Application topically daily as needed (irritation).     fluticasone (FLONASE) 50 MCG/ACT nasal spray Place 2 sprays into both nostrils daily as needed for allergies.      glimepiride (AMARYL) 1 MG tablet Take 1 mg by mouth daily with breakfast.     ibuprofen (ADVIL) 200 MG tablet Take 600 mg by mouth every 8 (eight) hours as  needed for moderate pain. (Patient not taking: Reported on 08/28/2022)     latanoprost (XALATAN) 0.005 % ophthalmic solution Place 1 drop into the right eye at bedtime.     levothyroxine (SYNTHROID, LEVOTHROID) 100 MCG tablet Take 100 mcg by mouth daily before breakfast.      losartan (COZAAR) 100 MG tablet Take 100 mg by mouth at bedtime.      meloxicam (MOBIC) 15 MG tablet Take 15 mg by mouth daily.     Misc Natural Products (COMPLETE PROSTATE HEALTH PO) Take 1 tablet by mouth 2 (two) times daily.     omeprazole (PRILOSEC) 20 MG capsule Take 20 mg by mouth daily.     oxybutynin (DITROPAN-XL) 5 MG 24 hr tablet TAKE 1 TABLET (5 MG TOTAL) BY MOUTH ONCE A WEEK. TAKE 2 HOURS BEFORE SCHEDULED CHEMO IN OUR OFFICE 30 tablet 1   sitaGLIPtin (JANUVIA) 100 MG tablet Take 100 mg by mouth every morning.     sodium bicarbonate 650 MG tablet Take 1 tablet (650 mg total) by mouth See admin instructions. Take 2 pills (1359m) by mouth before bedtime the night before chemo treatment, take 2 pills the morning of chemo treatment, and take 2 pills (13066m by mouth 30 minutes before bladder chemo injection 60 tablet 1   tamsulosin (FLOMAX) 0.4 MG CAPS capsule Take 0.4 mg by mouth every morning.      vitamin B-12 (CYANOCOBALAMIN) 500 MCG tablet Take 500 mcg by mouth daily.     No current facility-administered medications for this visit.    PHYSICAL EXAMINATION: ECOG PERFORMANCE STATUS: 1 - Symptomatic but completely ambulatory  Vitals:   09/25/22 0918  BP: 121/61  Pulse: 81  Resp: 18  Temp: 97.8 F (36.6 C)  SpO2: 97%   Wt Readings from Last 3 Encounters:  09/25/22 183 lb 6.4 oz (83.2 kg)  09/12/22 185 lb 4.8 oz (84.1 kg)  08/31/22 188 lb (85.3 kg)     GENERAL:alert, no distress and comfortable SKIN: skin color normal, no rashes or significant lesions EYES: normal, Conjunctiva are pink and non-injected, sclera clear  NEURO: alert & oriented x 3 with fluent speech   LABORATORY DATA:  I have  reviewed the data as listed    Latest Ref Rng & Units 09/25/2022    8:56 AM 09/12/2022    9:20 AM 08/31/2022  2:23 PM  CBC  WBC 4.0 - 10.5 K/uL 6.1  5.2  4.9   Hemoglobin 13.0 - 17.0 g/dL 12.4  12.2  12.4   Hematocrit 39.0 - 52.0 % 35.5  34.8  37.8   Platelets 150 - 400 K/uL 243  232  190         Latest Ref Rng & Units 09/12/2022    9:20 AM 08/31/2022    2:23 PM 08/28/2022   11:25 AM  CMP  Glucose 70 - 99 mg/dL 205  123  176   BUN 8 - 23 mg/dL 14  16  13   $ Creatinine 0.61 - 1.24 mg/dL 0.81  0.87  0.92   Sodium 135 - 145 mmol/L 137  137  136   Potassium 3.5 - 5.1 mmol/L 4.0  3.8  3.7   Chloride 98 - 111 mmol/L 103  104  103   CO2 22 - 32 mmol/L 28  25  27   $ Calcium 8.9 - 10.3 mg/dL 9.2  9.0  9.4   Total Protein 6.5 - 8.1 g/dL 7.2  7.7  7.2   Total Bilirubin 0.3 - 1.2 mg/dL 0.4  0.6  0.4   Alkaline Phos 38 - 126 U/L 67  50  62   AST 15 - 41 U/L 21  24  19   $ ALT 0 - 44 U/L 32  22  18       RADIOGRAPHIC STUDIES: I have personally reviewed the radiological images as listed and agreed with the findings in the report. No results found.    Orders Placed This Encounter  Procedures   CBC with Differential ([facility] Only)    Standing Status:   Future    Standing Expiration Date:   10/03/2023   CMP ([facility] only)    Standing Status:   Future    Standing Expiration Date:   10/03/2023   CBC with Differential ([facility] Only)    Standing Status:   Future    Standing Expiration Date:   10/10/2023   CMP ([facility] only)    Standing Status:   Future    Standing Expiration Date:   10/10/2023   All questions were answered. The patient knows to call the clinic with any problems, questions or concerns. No barriers to learning was detected. The total time spent in the appointment was 25 minutes.     Truitt Merle, MD 09/25/2022   Felicity Coyer, CMA, am acting as scribe for Truitt Merle, MD.   I have reviewed the above documentation for accuracy and completeness, and I  agree with the above.

## 2022-09-24 NOTE — Assessment & Plan Note (Signed)
--  initially diagnosed in 2017  -He was found to have high-grade urothelial carcinoma without muscle invasion and developed and subsequently BCG refractory disease in December 2021. He received Intravesicular gemcitabine with a total of 2000 mg weekly X6 started in February 2022  -f/u with urologist Dr. Diona Fanti  -bladder biopsies, bladder washings on 06/24/2022 showed focal urothelial carcinoma in situ (CIS) and cytology was positive for high grade urothelial carcinoma -I recommended intravesical gemcitabine 2000 mg weekly x 6, he started on 08/28/16.  He tolerated first cycle poorly with significant hematuria, and intermittent fever for week after treatment. -Due to his poor tolerance to intravesical gemcitabine, I changed therapy to intravesical mitomycin 40 mg every week for 5 weeks. He tolerated the first two treatments overall well

## 2022-09-25 ENCOUNTER — Other Ambulatory Visit: Payer: Self-pay

## 2022-09-25 ENCOUNTER — Inpatient Hospital Stay: Payer: Medicare HMO

## 2022-09-25 ENCOUNTER — Encounter: Payer: Self-pay | Admitting: Hematology

## 2022-09-25 ENCOUNTER — Inpatient Hospital Stay (HOSPITAL_BASED_OUTPATIENT_CLINIC_OR_DEPARTMENT_OTHER): Payer: Medicare HMO | Admitting: Hematology

## 2022-09-25 VITALS — BP 121/61 | HR 81 | Temp 97.8°F | Resp 18 | Ht 69.0 in | Wt 183.4 lb

## 2022-09-25 DIAGNOSIS — C671 Malignant neoplasm of dome of bladder: Secondary | ICD-10-CM

## 2022-09-25 DIAGNOSIS — C679 Malignant neoplasm of bladder, unspecified: Secondary | ICD-10-CM | POA: Diagnosis not present

## 2022-09-25 DIAGNOSIS — Z79899 Other long term (current) drug therapy: Secondary | ICD-10-CM | POA: Diagnosis not present

## 2022-09-25 DIAGNOSIS — Z5111 Encounter for antineoplastic chemotherapy: Secondary | ICD-10-CM | POA: Diagnosis not present

## 2022-09-25 DIAGNOSIS — C670 Malignant neoplasm of trigone of bladder: Secondary | ICD-10-CM | POA: Diagnosis not present

## 2022-09-25 LAB — CMP (CANCER CENTER ONLY)
ALT: 21 U/L (ref 0–44)
AST: 18 U/L (ref 15–41)
Albumin: 4 g/dL (ref 3.5–5.0)
Alkaline Phosphatase: 66 U/L (ref 38–126)
Anion gap: 6 (ref 5–15)
BUN: 16 mg/dL (ref 8–23)
CO2: 27 mmol/L (ref 22–32)
Calcium: 9.3 mg/dL (ref 8.9–10.3)
Chloride: 105 mmol/L (ref 98–111)
Creatinine: 0.9 mg/dL (ref 0.61–1.24)
GFR, Estimated: >60 mL/min (ref >60)
Glucose, Bld: 181 mg/dL — ABNORMAL HIGH (ref 70–99)
Potassium: 4 mmol/L (ref 3.5–5.1)
Sodium: 138 mmol/L (ref 135–145)
Total Bilirubin: 0.5 mg/dL (ref 0.3–1.2)
Total Protein: 7.2 g/dL (ref 6.5–8.1)

## 2022-09-25 LAB — CBC WITH DIFFERENTIAL (CANCER CENTER ONLY)
Abs Immature Granulocytes: 0.03 10*3/uL (ref 0.00–0.07)
Basophils Absolute: 0.1 10*3/uL (ref 0.0–0.1)
Basophils Relative: 1 %
Eosinophils Absolute: 0.4 10*3/uL (ref 0.0–0.5)
Eosinophils Relative: 6 %
HCT: 35.5 % — ABNORMAL LOW (ref 39.0–52.0)
Hemoglobin: 12.4 g/dL — ABNORMAL LOW (ref 13.0–17.0)
Immature Granulocytes: 1 %
Lymphocytes Relative: 19 %
Lymphs Abs: 1.1 10*3/uL (ref 0.7–4.0)
MCH: 30.5 pg (ref 26.0–34.0)
MCHC: 34.9 g/dL (ref 30.0–36.0)
MCV: 87.4 fL (ref 80.0–100.0)
Monocytes Absolute: 0.7 10*3/uL (ref 0.1–1.0)
Monocytes Relative: 11 %
Neutro Abs: 3.9 10*3/uL (ref 1.7–7.7)
Neutrophils Relative %: 62 %
Platelet Count: 243 10*3/uL (ref 150–400)
RBC: 4.06 MIL/uL — ABNORMAL LOW (ref 4.22–5.81)
RDW: 12.4 % (ref 11.5–15.5)
WBC Count: 6.1 10*3/uL (ref 4.0–10.5)
nRBC: 0 % (ref 0.0–0.2)

## 2022-09-25 MED ORDER — MITOMYCIN CHEMO FOR BLADDER INSTILLATION 40 MG
40.0000 mg | Freq: Once | INTRAVENOUS | Status: AC
  Administered 2022-09-25: 40 mg via INTRAVESICAL
  Filled 2022-09-25: qty 40

## 2022-09-25 MED ORDER — PROCHLORPERAZINE MALEATE 10 MG PO TABS
10.0000 mg | ORAL_TABLET | Freq: Once | ORAL | Status: AC
  Administered 2022-09-25: 10 mg via ORAL
  Filled 2022-09-25: qty 1

## 2022-09-25 MED ORDER — OXYBUTYNIN CHLORIDE 5 MG PO TABS
5.0000 mg | ORAL_TABLET | Freq: Once | ORAL | Status: AC | PRN
  Administered 2022-09-25: 5 mg via ORAL

## 2022-09-25 MED ORDER — LIDOCAINE HCL URETHRAL/MUCOSAL 2 % EX GEL
1.0000 | Freq: Once | CUTANEOUS | Status: AC
  Administered 2022-09-25: 1 via URETHRAL
  Filled 2022-09-25: qty 1

## 2022-09-25 NOTE — Patient Instructions (Signed)
[facility]  Discharge Instructions: Thank you for choosing [facility] to provide your oncology and hematology care.   If you have a lab appointment with the [facility], please go directly to the [facility] and check in at the registration area.   Wear comfortable clothing and clothing appropriate for easy access to any Portacath or PICC line.   We strive to give you quality time with your provider. You may need to reschedule your appointment if you arrive late (15 or more minutes).  Arriving late affects you and other patients whose appointments are after yours.  Also, if you miss three or more appointments without notifying the office, you may be dismissed from the clinic at the provider's discretion.      For prescription refill requests, have your pharmacy contact our office and allow 72 hours for refills to be completed.    Today you received the following chemotherapy and/or immunotherapy agents mitomycin      To help prevent nausea and vomiting after your treatment, we encourage you to take your nausea medication as directed.  BELOW ARE SYMPTOMS THAT SHOULD BE REPORTED IMMEDIATELY: *FEVER GREATER THAN 100.4 F (38 C) OR HIGHER *CHILLS OR SWEATING *NAUSEA AND VOMITING THAT IS NOT CONTROLLED WITH YOUR NAUSEA MEDICATION *UNUSUAL SHORTNESS OF BREATH *UNUSUAL BRUISING OR BLEEDING *URINARY PROBLEMS (pain or burning when urinating, or frequent urination) *BOWEL PROBLEMS (unusual diarrhea, constipation, pain near the anus) TENDERNESS IN MOUTH AND THROAT WITH OR WITHOUT PRESENCE OF ULCERS (sore throat, sores in mouth, or a toothache) UNUSUAL RASH, SWELLING OR PAIN  UNUSUAL VAGINAL DISCHARGE OR ITCHING   Items with * indicate a potential emergency and should be followed up as soon as possible or go to the Emergency Department if any problems should occur.  Please show the CHEMOTHERAPY ALERT CARD or IMMUNOTHERAPY ALERT CARD at  check-in to the Emergency Department and triage nurse.  Should you have questions after your visit or need to cancel or reschedule your appointment, please contact [facility]  Dept: [phone]  and follow the prompts.  Office hours are 8:00 a.m. to 4:30 p.m. Monday - Friday. Please note that voicemails left after 4:00 p.m. may not be returned until the following business day.  We are closed weekends and major holidays. You have access to a nurse at all times for urgent questions. Please call the main number to the clinic Dept: [phone] and follow the prompts.   For any non-urgent questions, you may also contact your provider using MyChart. We now offer e-Visits for anyone 59 and older to request care online for non-urgent symptoms. For details visit [email or web address].   Also download the MyChart app! Go to the app store, search "MyChart", open the app, select [facility], and log in with your MyChart username and password.

## 2022-09-25 NOTE — Progress Notes (Signed)
Pt given compazine on admission. Foley catheter 18Fr inserted using sterile technique. 300 cc urine drained. After 30 min wait, Ditropan given and chemo instilled over 12 minutes. Patient turned every 30 min starting on back, to R, then L then abdomen. Pt tolerated well. Foley unclamped and 450cc drained over 10 minutes. Foley removed and pt discharged in stable condition.

## 2022-10-02 ENCOUNTER — Other Ambulatory Visit: Payer: Self-pay

## 2022-10-02 ENCOUNTER — Inpatient Hospital Stay: Payer: Medicare HMO

## 2022-10-02 VITALS — BP 122/63 | HR 69 | Temp 98.0°F | Resp 16

## 2022-10-02 DIAGNOSIS — C670 Malignant neoplasm of trigone of bladder: Secondary | ICD-10-CM | POA: Diagnosis not present

## 2022-10-02 DIAGNOSIS — Z79899 Other long term (current) drug therapy: Secondary | ICD-10-CM | POA: Diagnosis not present

## 2022-10-02 DIAGNOSIS — C671 Malignant neoplasm of dome of bladder: Secondary | ICD-10-CM

## 2022-10-02 DIAGNOSIS — Z5111 Encounter for antineoplastic chemotherapy: Secondary | ICD-10-CM | POA: Diagnosis not present

## 2022-10-02 MED ORDER — PROCHLORPERAZINE MALEATE 10 MG PO TABS
10.0000 mg | ORAL_TABLET | Freq: Once | ORAL | Status: AC
  Administered 2022-10-02: 10 mg via ORAL
  Filled 2022-10-02: qty 1

## 2022-10-02 MED ORDER — LIDOCAINE HCL URETHRAL/MUCOSAL 2 % EX GEL
1.0000 | Freq: Once | CUTANEOUS | Status: AC
  Administered 2022-10-02: 1 via URETHRAL
  Filled 2022-10-02: qty 1

## 2022-10-02 MED ORDER — OXYBUTYNIN CHLORIDE 5 MG PO TABS
5.0000 mg | ORAL_TABLET | Freq: Once | ORAL | Status: AC | PRN
  Administered 2022-10-02: 5 mg via ORAL
  Filled 2022-10-02: qty 1

## 2022-10-02 MED ORDER — MITOMYCIN CHEMO FOR BLADDER INSTILLATION 40 MG
40.0000 mg | Freq: Once | INTRAVENOUS | Status: AC
  Administered 2022-10-02: 40 mg via INTRAVESICAL
  Filled 2022-10-02: qty 40

## 2022-10-02 NOTE — Patient Instructions (Signed)
[facility]  Discharge Instructions: Thank you for choosing [facility] to provide your oncology and hematology care.   If you have a lab appointment with the [facility], please go directly to the [facility] and check in at the registration area.   Wear comfortable clothing and clothing appropriate for easy access to any Portacath or PICC line.   We strive to give you quality time with your provider. You may need to reschedule your appointment if you arrive late (15 or more minutes).  Arriving late affects you and other patients whose appointments are after yours.  Also, if you miss three or more appointments without notifying the office, you may be dismissed from the clinic at the provider's discretion.      For prescription refill requests, have your pharmacy contact our office and allow 72 hours for refills to be completed.    Today you received the following chemotherapy and/or immunotherapy agents: mitomycin      To help prevent nausea and vomiting after your treatment, we encourage you to take your nausea medication as directed.  BELOW ARE SYMPTOMS THAT SHOULD BE REPORTED IMMEDIATELY: *FEVER GREATER THAN 100.4 F (38 C) OR HIGHER *CHILLS OR SWEATING *NAUSEA AND VOMITING THAT IS NOT CONTROLLED WITH YOUR NAUSEA MEDICATION *UNUSUAL SHORTNESS OF BREATH *UNUSUAL BRUISING OR BLEEDING *URINARY PROBLEMS (pain or burning when urinating, or frequent urination) *BOWEL PROBLEMS (unusual diarrhea, constipation, pain near the anus) TENDERNESS IN MOUTH AND THROAT WITH OR WITHOUT PRESENCE OF ULCERS (sore throat, sores in mouth, or a toothache) UNUSUAL RASH, SWELLING OR PAIN  UNUSUAL VAGINAL DISCHARGE OR ITCHING   Items with * indicate a potential emergency and should be followed up as soon as possible or go to the Emergency Department if any problems should occur.  Please show the CHEMOTHERAPY ALERT CARD or IMMUNOTHERAPY ALERT CARD at  check-in to the Emergency Department and triage nurse.  Should you have questions after your visit or need to cancel or reschedule your appointment, please contact [facility]  Dept: [phone]  and follow the prompts.  Office hours are 8:00 a.m. to 4:30 p.m. Monday - Friday. Please note that voicemails left after 4:00 p.m. may not be returned until the following business day.  We are closed weekends and major holidays. You have access to a nurse at all times for urgent questions. Please call the main number to the clinic Dept: [phone] and follow the prompts.   For any non-urgent questions, you may also contact your provider using MyChart. We now offer e-Visits for anyone 32 and older to request care online for non-urgent symptoms. For details visit [email or web address].   Also download the MyChart app! Go to the app store, search "MyChart", open the app, select [facility], and log in with your MyChart username and password.

## 2022-10-02 NOTE — Progress Notes (Signed)
Per Dr Burr Medico, NO labs needed today prior to treatment.   Patient tolerated chemo dwell time well. Full 2 hours achieved. Patient rotated every 30 minutes, side to side, and prone/supine. Minor bladder spasms reported by patient. 550cc output drained after dwell time. Catheter removed intact.

## 2022-10-08 NOTE — Assessment & Plan Note (Signed)
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 He received Intravesicular gemcitabine with a total of 2000 mg weekly X6 started in February 2022  -bladder biopsies, bladder washings on 06/24/2022 showed focal urothelial carcinoma in situ (CIS) and cytology was positive for high grade urothelial carcinoma -I recommended intravesical gemcitabine 2000 mg weekly x 6, he started on 08/28/16.  He tolerated first cycle poorly with significant hematuria, and intermittent fever for week after treatment. -Due to his poor tolerance to intravesical gemcitabine, I changed therapy to intravesical mitomycin 40 mg every week for 5 weeks. He tolerated the first two treatments overall well  -He is clinically doing well, did have mild intermittent low-grade fever after treatment last week, but overall tolerating well. -Lab reviewed, will proceed last treatment today --f/u with urologist Dr. Diona Fanti for disease monitoring, I will see him back as needed.

## 2022-10-09 ENCOUNTER — Other Ambulatory Visit: Payer: Self-pay

## 2022-10-09 ENCOUNTER — Inpatient Hospital Stay: Payer: Medicare HMO

## 2022-10-09 ENCOUNTER — Encounter: Payer: Self-pay | Admitting: Hematology

## 2022-10-09 ENCOUNTER — Inpatient Hospital Stay (HOSPITAL_BASED_OUTPATIENT_CLINIC_OR_DEPARTMENT_OTHER): Payer: Medicare HMO | Admitting: Hematology

## 2022-10-09 VITALS — BP 155/68 | HR 67 | Temp 98.2°F | Resp 18 | Ht 69.0 in | Wt 184.5 lb

## 2022-10-09 DIAGNOSIS — C671 Malignant neoplasm of dome of bladder: Secondary | ICD-10-CM

## 2022-10-09 DIAGNOSIS — Z5111 Encounter for antineoplastic chemotherapy: Secondary | ICD-10-CM | POA: Diagnosis not present

## 2022-10-09 DIAGNOSIS — C679 Malignant neoplasm of bladder, unspecified: Secondary | ICD-10-CM

## 2022-10-09 DIAGNOSIS — C670 Malignant neoplasm of trigone of bladder: Secondary | ICD-10-CM | POA: Diagnosis not present

## 2022-10-09 DIAGNOSIS — Z79899 Other long term (current) drug therapy: Secondary | ICD-10-CM | POA: Diagnosis not present

## 2022-10-09 LAB — CMP (CANCER CENTER ONLY)
ALT: 19 U/L (ref 0–44)
AST: 17 U/L (ref 15–41)
Albumin: 4.3 g/dL (ref 3.5–5.0)
Alkaline Phosphatase: 52 U/L (ref 38–126)
Anion gap: 5 (ref 5–15)
BUN: 13 mg/dL (ref 8–23)
CO2: 27 mmol/L (ref 22–32)
Calcium: 9 mg/dL (ref 8.9–10.3)
Chloride: 107 mmol/L (ref 98–111)
Creatinine: 0.83 mg/dL (ref 0.61–1.24)
GFR, Estimated: >60 mL/min (ref >60)
Glucose, Bld: 149 mg/dL — ABNORMAL HIGH (ref 70–99)
Potassium: 3.8 mmol/L (ref 3.5–5.1)
Sodium: 139 mmol/L (ref 135–145)
Total Bilirubin: 0.4 mg/dL (ref 0.3–1.2)
Total Protein: 6.8 g/dL (ref 6.5–8.1)

## 2022-10-09 LAB — CBC WITH DIFFERENTIAL (CANCER CENTER ONLY)
Abs Immature Granulocytes: 0.02 10*3/uL (ref 0.00–0.07)
Basophils Absolute: 0.1 10*3/uL (ref 0.0–0.1)
Basophils Relative: 1 %
Eosinophils Absolute: 0.4 10*3/uL (ref 0.0–0.5)
Eosinophils Relative: 7 %
HCT: 36.3 % — ABNORMAL LOW (ref 39.0–52.0)
Hemoglobin: 12.7 g/dL — ABNORMAL LOW (ref 13.0–17.0)
Immature Granulocytes: 0 %
Lymphocytes Relative: 30 %
Lymphs Abs: 1.5 10*3/uL (ref 0.7–4.0)
MCH: 31 pg (ref 26.0–34.0)
MCHC: 35 g/dL (ref 30.0–36.0)
MCV: 88.5 fL (ref 80.0–100.0)
Monocytes Absolute: 0.4 10*3/uL (ref 0.1–1.0)
Monocytes Relative: 8 %
Neutro Abs: 2.7 10*3/uL (ref 1.7–7.7)
Neutrophils Relative %: 54 %
Platelet Count: 171 10*3/uL (ref 150–400)
RBC: 4.1 MIL/uL — ABNORMAL LOW (ref 4.22–5.81)
RDW: 13.1 % (ref 11.5–15.5)
WBC Count: 5.1 10*3/uL (ref 4.0–10.5)
nRBC: 0 % (ref 0.0–0.2)

## 2022-10-09 MED ORDER — OXYBUTYNIN CHLORIDE 5 MG PO TABS
5.0000 mg | ORAL_TABLET | Freq: Once | ORAL | Status: AC | PRN
  Administered 2022-10-09: 5 mg via ORAL

## 2022-10-09 MED ORDER — PROCHLORPERAZINE MALEATE 10 MG PO TABS
10.0000 mg | ORAL_TABLET | Freq: Once | ORAL | Status: AC
  Administered 2022-10-09: 10 mg via ORAL
  Filled 2022-10-09: qty 1

## 2022-10-09 MED ORDER — MITOMYCIN CHEMO FOR BLADDER INSTILLATION 40 MG
40.0000 mg | Freq: Once | INTRAVENOUS | Status: AC
  Administered 2022-10-09: 40 mg via INTRAVESICAL
  Filled 2022-10-09: qty 40

## 2022-10-09 MED ORDER — LIDOCAINE HCL URETHRAL/MUCOSAL 2 % EX GEL
1.0000 | Freq: Once | CUTANEOUS | Status: AC
  Administered 2022-10-09: 1 via URETHRAL
  Filled 2022-10-09: qty 1

## 2022-10-09 NOTE — Progress Notes (Signed)
Patient tolerated chemo dwell time well. Full 2 hours achieved. Patient rotated every 30 minutes, side to side, and prone/supine. Minor bladder spasms reported by patient when on left and right side. Minor leaking occurred when on left and right sides. Pt remained on back for more than half of the tretment. 850cc output drained after dwell time. Catheter removed intact.

## 2022-10-09 NOTE — Progress Notes (Signed)
[facility]   Telephone:([phone] Fax:([phone]   Clinic Follow up Note   Patient Care Team: Sueanne Margarita, DO as PCP - General (Internal Medicine) Truitt Merle, MD as Attending Physician (Hematology and Oncology) Franchot Gallo, MD as Consulting Physician (Urology)  Date of Service:  10/09/2022  CHIEF COMPLAINT: f/u of bladder cancer    CURRENT THERAPY:   Intravesical Mytomycin 40 mg q wk for 5 wks    ASSESSMENT:  Keith [name] is a 82 y.o. male with   Bladder cancer ([facility]) -initially diagnosed in 2017  -He was found to have high-grade urothelial carcinoma without muscle invasion and developed and subsequently BCG refractory disease in December 2021. He received Intravesicular gemcitabine with a total of 2000 mg weekly X6 started in February 2022  -bladder biopsies, bladder washings on 06/24/2022 showed focal urothelial carcinoma in situ (CIS) and cytology was positive for high grade urothelial carcinoma -I recommended intravesical gemcitabine 2000 mg weekly x 6, he started on 08/28/16.  He tolerated first cycle poorly with significant hematuria, and intermittent fever for week after treatment. -Due to his poor tolerance to intravesical gemcitabine, I changed therapy to intravesical mitomycin 40 mg every week for 5 weeks. He tolerated the treatment very well  -He is clinically doing well, did not have fever or any other noticeable side effect after last few treatments. -Lab reviewed, will proceed last treatment today --f/u with urologist Dr. Diona Fanti for disease monitoring, I will see him back as needed.    PLAN: -lab reviewed. -proceed with last cycle intravesical mitomycin today -He will follow-up with his urologist in April -I will see him as needed in the future.  SUMMARY OF ONCOLOGIC HISTORY: Oncology History  Bladder cancer ([facility])  09/12/2020 Initial Diagnosis   Bladder cancer ([facility])   09/12/2020 Cancer Staging   Staging form: Urinary  Bladder, AJCC 8th Edition - Clinical: Stage I (cT1, cN0, cM0) - Signed by Wyatt Portela, MD on 09/12/2020 WHO/ISUP grade (low/high): High Grade Histologic grading system: 2 grade system   06/24/2022 Pathology Results             FINAL MICROSCOPIC DIAGNOSIS:  A. BLADDER, BIOPSY: Focal urothelial carcinoma in situ (CIS)     06/24/2022 Cancer Staging   Staging form: Urinary Bladder, AJCC 8th Edition - Pathologic stage from 06/24/2022: Stage 0is (pTis, pN0, cM0) - Signed by Truitt Merle, MD on 08/24/2022 Stage prefix: Initial diagnosis WHO/ISUP grade (low/high): High Grade Histologic grading system: 2 grade system   08/28/2022 - 08/28/2022 Chemotherapy   Patient is on Treatment Plan : BLADDER Gemcitabine INTRAVESICAL (2000) q7d     09/12/2022 -  Chemotherapy   Patient is on Treatment Plan : BLADDER Mitomycin INTRAVESICAL (40 mg) q7d     Malignant neoplasm of trigone of urinary bladder ([facility])  09/12/2020 Initial Diagnosis   Malignant neoplasm of trigone of urinary bladder ([facility])   09/26/2020 - 10/31/2020 Chemotherapy   Patient is on Treatment Plan : BLADDER Gemcitabine q7d        INTERVAL HISTORY:  Keith [name] is here for a follow up of bladder cancer and last treatment of intravesical chemo.  He presented to the clinic with his wife.  He tolerated last few treatments very well, no fever, cramps, or other noticeable side effects.  He has mild fatigued, no other complaints.  All other systems were reviewed with the patient and are negative.  MEDICAL HISTORY:  Past Medical History:  Diagnosis Date   Bladder cancer ([facility])  Blind right eye    BPH (benign prostatic hypertrophy)    Cataracts, both eyes    Depression    DM type 2 (diabetes mellitus, type 2) (HCC)    DOE (dyspnea on exertion)    with heavy exertion   Full dentures    GERD (gastroesophageal reflux disease)    Glaucoma no peripheral or side vision   right eye blind   Glaucoma, right eye    HOH (hard of  hearing)    Hyperlipidemia    Hypertension    Hypothyroidism    Local recurrence of cancer of urinary bladder [facility]) urologsit-  dr Diona Fanti   1st dx 01-29-2016--  now recurrent 05-31-2016   Lower extremity edema 07/11/2020   right swells more than left goes down at night after sleeping   Lower urinary tract symptoms (LUTS) 2019   Migraine    Peripheral neuropathy    middle finger left hand   Pneumonia 2019   Skin abnormality    base of spine looks like pimple has had since 07-01-2020, no drainage area intact   Wears glasses     SURGICAL HISTORY: Past Surgical History:  Procedure Laterality Date   ANTERIOR CERVICAL DECOMP/DISCECTOMY FUSION  08-15-2000   C5 -- C6   ANTERIOR CERVICAL DECOMP/DISCECTOMY FUSION  03-04-2008   C6 -- C7   areas removed from back at spine  2021 jan and aug 2021   CYSTOSCOPY W/ RETROGRADES Left 05/11/2020   Procedure: Cystoscopy with left retrograde ureteropyelogram, resection of probable bladder tumor and left trigonal/ureteral orifice region, 3 cm in size, fluoroscopic interpretation, left ureteroscopy, placement of 6 French by 26 cm contour double-J stent without tether;  Surgeon: Franchot Gallo, MD;  Location[facility];  Service: Urology;  Laterality: Left;   CYSTOSCOPY W/ RETROGRADES Bilateral 06/24/2022   Procedure: CYSTOSCOPY WITH RETROGRADE PYELOGRAM;  Surgeon: Franchot Gallo, MD;  Location: [facility];  Service: Urology;  Laterality: Bilateral;  1 HR   CYSTOSCOPY WITH BIOPSY N/A 01/29/2016   Procedure: CYSTOSCOPY WITH BIOPSY;  Surgeon: Franchot Gallo, MD;  Location[facility];  Service: Urology;  Laterality: N/A;   CYSTOSCOPY WITH BIOPSY N/A 05/31/2016   Procedure: CYSTOSCOPY WITH BIOPSY;  Surgeon: Franchot Gallo, MD;  Location: [facility];  Service: Urology;  Laterality: N/A;   CYSTOSCOPY WITH BIOPSY N/A 06/24/2022   Procedure: CYSTOSCOPY WITH BIOPSY;  Surgeon: Franchot Gallo, MD;   Location: [facility];  Service: Urology;  Laterality: N/A;   EYE SURGERY Right [place] and 2000   lower back  06/01/2019, 02-10-2020   nose cancer  areas removed  2019, 2021   x 2 / 3/ 2021 small area removed from nose   surgery for sacral fracture  40 yrs ago   TRANSURETHRAL RESECTION OF BLADDER TUMOR N/A 07/17/2020   Procedure: TRANSURETHRAL RESECTION OF BLADDER TUMOR (TURBT);  Surgeon: Franchot Gallo, MD;  Location: [facility];  Service: Urology;  Laterality: N/A;    I have reviewed the social history and family history with the patient and they are unchanged from previous note.  ALLERGIES:  has No Known Allergies.  MEDICATIONS:  Current Outpatient Medications  Medication Sig Dispense Refill   acetaminophen (TYLENOL) 500 MG tablet Take 1,000 mg by mouth every 8 (eight) hours as needed for moderate pain.     amLODipine (NORVASC) 10 MG tablet Take 10 mg by mouth daily.     Aspirin-Salicylamide-Caffeine (ARTHRITIS  STRENGTH BC POWDER PO) Take 1 packet by mouth daily as needed (pain). (Patient not taking: Reported on 08/28/2022)     atorvastatin (LIPITOR) 20 MG tablet Take 20 mg by mouth at bedtime.      carteolol (OCUPRESS) 1 % ophthalmic solution Place 1 drop into the right eye every morning.     cetirizine (ZYRTEC) 10 MG tablet Take 10 mg by mouth daily as needed for allergies.     clotrimazole-betamethasone (LOTRISONE) lotion Apply 1 Application topically daily as needed (irritation).     fluticasone (FLONASE) 50 MCG/ACT nasal spray Place 2 sprays into both nostrils daily as needed for allergies.      glimepiride (AMARYL) 1 MG tablet Take 1 mg by mouth daily with breakfast.     ibuprofen (ADVIL) 200 MG tablet Take 600 mg by mouth every 8 (eight) hours as needed for moderate pain. (Patient not taking: Reported on 08/28/2022)     latanoprost (XALATAN) 0.005 % ophthalmic solution Place 1 drop into the right eye at bedtime.      levothyroxine (SYNTHROID, LEVOTHROID) 100 MCG tablet Take 100 mcg by mouth daily before breakfast.      losartan (COZAAR) 100 MG tablet Take 100 mg by mouth at bedtime.      meloxicam (MOBIC) 15 MG tablet Take 15 mg by mouth daily.     Misc Natural Products (COMPLETE PROSTATE HEALTH PO) Take 1 tablet by mouth 2 (two) times daily.     omeprazole (PRILOSEC) 20 MG capsule Take 20 mg by mouth daily.     oxybutynin (DITROPAN-XL) 5 MG 24 hr tablet TAKE 1 TABLET (5 MG TOTAL) BY MOUTH ONCE A WEEK. TAKE 2 HOURS BEFORE SCHEDULED CHEMO IN OUR OFFICE 30 tablet 1   sitaGLIPtin (JANUVIA) 100 MG tablet Take 100 mg by mouth every morning.     sodium bicarbonate 650 MG tablet Take 1 tablet (650 mg total) by mouth See admin instructions. Take 2 pills ('1300mg'$ ) by mouth before bedtime the night before chemo treatment, take 2 pills the morning of chemo treatment, and take 2 pills ('1300mg'$ ) by mouth 30 minutes before bladder chemo injection 60 tablet 1   tamsulosin (FLOMAX) 0.4 MG CAPS capsule Take 0.4 mg by mouth every morning.      vitamin B-12 (CYANOCOBALAMIN) 500 MCG tablet Take 500 mcg by mouth daily.     No current facility-administered medications for this visit.   Facility-Administered Medications Ordered in Other Visits  Medication Dose Route Frequency Provider Last Rate Last Admin   mitoMYcin (MUTAMYCIN) chemo injection 40 mg  40 mg Bladder Instillation Once Truitt Merle, MD   40 mg at 10/09/22 1331    PHYSICAL EXAMINATION: ECOG PERFORMANCE STATUS: 1 - Symptomatic but completely ambulatory  Vitals:   10/09/22 1156  BP: (!) 155/68  Pulse: 67  Resp: 18  Temp: 98.2 F (36.8 C)  SpO2: 98%   Wt Readings from Last 3 Encounters:  10/09/22 184 lb 8 oz (83.7 kg)  09/25/22 183 lb 6.4 oz (83.2 kg)  09/12/22 185 lb 4.8 oz (84.1 kg)     GENERAL:alert, no distress and comfortable SKIN: skin color normal, no rashes or significant lesions EYES: normal, Conjunctiva are pink and non-injected, sclera clear   NEURO: alert & oriented x 3 with fluent speech   LABORATORY DATA:  I have reviewed the data as listed    Latest Ref Rng & Units 10/09/2022   11:35 AM 09/25/2022    8:56 AM 09/12/2022    9:20 AM  CBC  WBC 4.0 - 10.5 K/uL 5.1  6.1  5.2   Hemoglobin 13.0 - 17.0 g/dL 12.7  12.4  12.2   Hematocrit 39.0 - 52.0 % 36.3  35.5  34.8   Platelets 150 - 400 K/uL 171  243  232         Latest Ref Rng & Units 10/09/2022   11:35 AM 09/25/2022    8:56 AM 09/12/2022    9:20 AM  CMP  Glucose 70 - 99 mg/dL 149  181  205   BUN 8 - 23 mg/dL '13  16  14   '$ Creatinine 0.61 - 1.24 mg/dL 0.83  0.90  0.81   Sodium 135 - 145 mmol/L 139  138  137   Potassium 3.5 - 5.1 mmol/L 3.8  4.0  4.0   Chloride 98 - 111 mmol/L 107  105  103   CO2 22 - 32 mmol/L '27  27  28   '$ Calcium 8.9 - 10.3 mg/dL 9.0  9.3  9.2   Total Protein 6.5 - 8.1 g/dL 6.8  7.2  7.2   Total Bilirubin 0.3 - 1.2 mg/dL 0.4  0.5  0.4   Alkaline Phos 38 - 126 U/L 52  66  67   AST 15 - 41 U/L '17  18  21   '$ ALT 0 - 44 U/L 19  21  32       RADIOGRAPHIC STUDIES: I have personally reviewed the radiological images as listed and agreed with the findings in the report. No results found.    No orders of the defined types were placed in this encounter.  All questions were answered. The patient knows to call the clinic with any problems, questions or concerns. No barriers to learning was detected. The total time spent in the appointment was 25 minutes.     Truitt Merle, MD 10/09/2022

## 2022-10-09 NOTE — Patient Instructions (Signed)
Keith [name]  Discharge Instructions: Thank you for choosing [facility] to provide your oncology and hematology care.   If you have a lab appointment with the [facility], please go directly to the [facility] and check in at the registration area.   Wear comfortable clothing and clothing appropriate for easy access to any Portacath or PICC line.   We strive to give you quality time with your provider. You may need to reschedule your appointment if you arrive late (15 or more minutes).  Arriving late affects you and other patients whose appointments are after yours.  Also, if you miss three or more appointments without notifying the office, you may be dismissed from the clinic at the provider's discretion.      For prescription refill requests, have your pharmacy contact our office and allow 72 hours for refills to be completed.    Today you received the following chemotherapy and/or immunotherapy agents mitomycin      To help prevent nausea and vomiting after your treatment, we encourage you to take your nausea medication as directed.  BELOW ARE SYMPTOMS THAT SHOULD BE REPORTED IMMEDIATELY: *FEVER GREATER THAN 100.4 F (38 C) OR HIGHER *CHILLS OR SWEATING *NAUSEA AND VOMITING THAT IS NOT CONTROLLED WITH YOUR NAUSEA MEDICATION *UNUSUAL SHORTNESS OF BREATH *UNUSUAL BRUISING OR BLEEDING *URINARY PROBLEMS (pain or burning when urinating, or frequent urination) *BOWEL PROBLEMS (unusual diarrhea, constipation, pain near the anus) TENDERNESS IN MOUTH AND THROAT WITH OR WITHOUT PRESENCE OF ULCERS (sore throat, sores in mouth, or a toothache) UNUSUAL RASH, SWELLING OR PAIN  UNUSUAL VAGINAL DISCHARGE OR ITCHING   Items with * indicate a potential emergency and should be followed up as soon as possible or go to the Emergency Department if any problems should occur.  Please show the CHEMOTHERAPY ALERT CARD or IMMUNOTHERAPY ALERT CARD at  check-in to the Emergency Department and triage nurse.  Should you have questions after your visit or need to cancel or reschedule your appointment, please contact [employer]  Dept: [phone]  and follow the prompts.  Office hours are 8:00 a.m. to 4:30 p.m. Monday - Friday. Please note that voicemails left after 4:00 p.m. may not be returned until the following business day.  We are closed weekends and major holidays. You have access to a nurse at all times for urgent questions. Please call the main number to the clinic Dept: [phone] and follow the prompts.   For any non-urgent questions, you may also contact your provider using MyChart. We now offer e-Visits for anyone 46 and older to request care online for non-urgent symptoms. For details visit [email or web address].   Also download the MyChart app! Go to the app store, search "MyChart", open the app, select [facility], and log in with your MyChart username and password.

## 2022-10-15 ENCOUNTER — Other Ambulatory Visit: Payer: Self-pay | Admitting: Hematology

## 2022-10-16 ENCOUNTER — Other Ambulatory Visit: Payer: Self-pay | Admitting: Genetic Counselor

## 2022-10-16 DIAGNOSIS — C671 Malignant neoplasm of dome of bladder: Secondary | ICD-10-CM

## 2022-10-16 NOTE — Progress Notes (Unsigned)
REFERRING PROVIDER: Truitt Merle, MD [address] [place],  [place] [address]  PRIMARY PROVIDER:  Sueanne Margarita, DO  PRIMARY REASON FOR VISIT:  No diagnosis found.   HISTORY OF PRESENT ILLNESS:   Keith [name], a 82 y.o. male, was seen for a [facility] cancer genetics consultation at the request of Dr. Burr Medico due to a personal history of bladder cancer and a family history of breast cancer.  Keith [name] presents to clinic today to discuss the possibility of a hereditary predisposition to cancer, to discuss genetic testing, and to further clarify his future cancer risks, as well as potential cancer risks for family members.   Keith [name] was initially diagnosed with bladder cancer at the age of 37.   CANCER HISTORY:  Oncology History  Bladder cancer ([place])  09/12/2020 Initial Diagnosis   Bladder cancer ([place])   09/12/2020 Cancer Staging   Staging form: Urinary Bladder, AJCC 8th Edition - Clinical: Stage I (cT1, cN0, cM0) - Signed by Wyatt Portela, MD on 09/12/2020 WHO/ISUP grade (low/high): High Grade Histologic grading system: 2 grade system   06/24/2022 Pathology Results             FINAL MICROSCOPIC DIAGNOSIS:  A. BLADDER, BIOPSY: Focal urothelial carcinoma in situ (CIS)     06/24/2022 Cancer Staging   Staging form: Urinary Bladder, AJCC 8th Edition - Pathologic stage from 06/24/2022: Stage 0is (pTis, pN0, cM0) - Signed by Truitt Merle, MD on 08/24/2022 Stage prefix: Initial diagnosis WHO/ISUP grade (low/high): High Grade Histologic grading system: 2 grade system   08/28/2022 - 08/28/2022 Chemotherapy   Patient is on Treatment Plan : BLADDER Gemcitabine INTRAVESICAL (2000) q7d     09/12/2022 -  Chemotherapy   Patient is on Treatment Plan : BLADDER Mitomycin INTRAVESICAL (40 mg) q7d     Malignant neoplasm of trigone of urinary bladder ([place])  09/12/2020 Initial Diagnosis   Malignant neoplasm of trigone of urinary bladder ([place])   09/26/2020 - 10/31/2020 Chemotherapy    Patient is on Treatment Plan : BLADDER Gemcitabine q7d        RISK FACTORS:  Colonoscopy: ***   Past Medical History:  Diagnosis Date   Bladder cancer ([place])    Blind right eye    BPH (benign prostatic hypertrophy)    Cataracts, both eyes    Depression    DM type 2 (diabetes mellitus, type 2) ([place])    DOE (dyspnea on exertion)    with heavy exertion   Full dentures    GERD (gastroesophageal reflux disease)    Glaucoma no peripheral or side vision   right eye blind   Glaucoma, right eye    HOH (hard of hearing)    Hyperlipidemia    Hypertension    Hypothyroidism    Local recurrence of cancer of urinary bladder ([place]) urologsit-  dr Diona Fanti   1st dx 01-29-2016--  now recurrent 05-31-2016   Lower extremity edema 07/11/2020   right swells more than left goes down at night after sleeping   Lower urinary tract symptoms (LUTS) 2019   Migraine    Peripheral neuropathy    middle finger left hand   Pneumonia 2019   Skin abnormality    base of spine looks like pimple has had since 07-01-2020, no drainage area intact   Wears glasses     Past Surgical History:  Procedure Laterality Date   ANTERIOR CERVICAL DECOMP/DISCECTOMY FUSION  08-15-2000   C5 -- C6   ANTERIOR CERVICAL DECOMP/DISCECTOMY FUSION  03-04-2008  C6 -- C7   areas removed from back at spine  2021 jan and aug 2021   CYSTOSCOPY W/ RETROGRADES Left 05/11/2020   Procedure: Cystoscopy with left retrograde ureteropyelogram, resection of probable bladder tumor and left trigonal/ureteral orifice region, 3 cm in size, fluoroscopic interpretation, left ureteroscopy, placement of 6 French by 26 cm contour double-J stent without tether;  Surgeon: Franchot Gallo, MD;  Location[facility];  Service: Urology;  Laterality: Left;   CYSTOSCOPY W/ RETROGRADES Bilateral 06/24/2022   Procedure: CYSTOSCOPY WITH RETROGRADE PYELOGRAM;  Surgeon: Franchot Gallo, MD;  Location: [facility];  Service: Urology;   Laterality: Bilateral;  1 HR   CYSTOSCOPY WITH BIOPSY N/A 01/29/2016   Procedure: CYSTOSCOPY WITH BIOPSY;  Surgeon: Franchot Gallo, MD;  Location: [facility];  Service: Urology;  Laterality: N/A;   CYSTOSCOPY WITH BIOPSY N/A 05/31/2016   Procedure: CYSTOSCOPY WITH BIOPSY;  Surgeon: Franchot Gallo, MD;  Location: [facility];  Service: Urology;  Laterality: N/A;   CYSTOSCOPY WITH BIOPSY N/A 06/24/2022   Procedure: CYSTOSCOPY WITH BIOPSY;  Surgeon: Franchot Gallo, MD;  Location: [facility];  Service: Urology;  Laterality: N/A;   EYE SURGERY Right [place] and 2000   lower back  06/01/2019, 02-10-2020   nose cancer  areas removed  2019, 2021   x 2 / 3/ 2021 small area removed from nose   surgery for sacral fracture  40 yrs ago   TRANSURETHRAL RESECTION OF BLADDER TUMOR N/A 07/17/2020   Procedure: TRANSURETHRAL RESECTION OF BLADDER TUMOR (TURBT);  Surgeon: Franchot Gallo, MD;  Location: [facility];  Service: Urology;  Laterality: N/A;    FAMILY HISTORY:  We obtained a detailed, 4-generation family history.  Significant diagnoses are listed below: Family History  Problem Relation Age of Onset   Heart disease Mother    Cancer Father        unknown type   Heart disease Father    Cancer Sister 71       breast cancer   Cancer Sister        breast cancer   Cancer Sister        breast cancer   Cancer Sister        brain cancer   Cancer Brother        bladder cancer   Cancer Brother        lung cancer   Cancer Brother        lung cancer    Keith [name] is {aware/unaware} of previous family history of genetic testing for hereditary cancer risks. Patient's maternal ancestors are of *** descent, and paternal ancestors are of *** descent. There {IS NO:12509} reported Ashkenazi Jewish ancestry. There {IS NO:12509} known consanguinity.  GENETIC COUNSELING ASSESSMENT: Keith [name] is a 82 y.o. male  with a {Personal/family:20331} history of {cancer/polyps} which is somewhat suggestive of a {DISEASE} and predisposition to cancer given ***. We, therefore, discussed and recommended the following at today's visit.   DISCUSSION: We discussed that *** - ***% of *** is hereditary.  Most cases of *** associated with ***.  There are other genes that can be associated with hereditary *** cancer syndromes.  These include ***.  We discussed that testing is beneficial for several reasons including knowing how to follow individuals for their cancer risks, identifying whether potential treatment options *** would be beneficial, and understanding if other family members could be at risk for  cancer and allowing them to undergo genetic testing.   We reviewed the characteristics, features and inheritance patterns of hereditary cancer syndromes. We also discussed genetic testing, including the appropriate family members to test, the process of testing, insurance coverage and turn-around-time for results. We discussed the implications of a negative, positive, carrier and/or variant of uncertain significant result. We recommended Keith [name] pursue genetic testing for a panel that includes genes associated with *** cancer.   Keith [name]  was offered a common hereditary cancer panel (47 genes) and an expanded pan-cancer panel (77 genes). Keith [name] was informed of the benefits and limitations of each panel, including that expanded pan-cancer panels contain genes that do not have clear management guidelines at this point in time.  We also discussed that as the number of genes included on a panel increases, the chances of variants of uncertain significance increases.  After considering the benefits and limitations of each gene panel, Keith [name]  elected to have *** through ***.   Based on Keith [name] {Personal/family:20331} history of cancer, he meets medical criteria for genetic testing. Despite that he meets  criteria, he may still have an out of pocket cost. We discussed that if his out of pocket cost for testing is over $100, the laboratory should contact him and discuss the self-pay prices and/or patient pay assistance programs.    ***We reviewed the characteristics, features and inheritance patterns of hereditary cancer syndromes. We also discussed genetic testing, including the appropriate family members to test, the process of testing, insurance coverage and turn-around-time for results. We discussed the implications of a negative, positive and/or variant of uncertain significant result. In order to get genetic test results in a timely manner so that Keith [name] can use these genetic test results for surgical decisions, we recommended Keith [name] pursue genetic testing for the ***. Once complete, we recommend Keith [name] pursue reflex genetic testing to the *** gene panel.   Based on Mr. Beber's {Personal/family:20331} history of cancer, he meets medical criteria for genetic testing. Despite that he meets criteria, he may still have an out of pocket cost.   ***We discussed with Keith [name] that the {Personal/family:20331} history does not meet insurance or NCCN criteria for genetic testing and, therefore, is not highly consistent with a familial hereditary cancer syndrome.  We feel he is at low risk to harbor a gene mutation associated with such a condition. Thus, we did not recommend any genetic testing, at this time, and recommended Keith [name] continue to follow the cancer screening guidelines given by his primary healthcare provider.  ***In order to estimate his chance of having a {CA GENE:62345} mutation, we used statistical models ({GENMODELS:62370}) that consider his personal medical history, family history and ancestry.  Because each model is different, there can be a lot of variability in the risks they give.  Therefore, these numbers must be considered a rough range and not a precise risk  of having a {CA GENE:62345} mutation.  These models estimate that she has approximately a ***-***% chance of having a mutation. Based on this assessment of her family and personal history, genetic testing {IS/ISNOT:34056} recommended.  ***Based on the patient's {Personal/family:20331} history, a statistical model ({GENMODELS:62370}) was used to estimate his risk of developing {CA HX:54794}. This estimates his lifetime risk of developing {CA HX:54794} to be approximately ***%. This estimation does not consider any genetic testing results.  The patient's lifetime breast cancer risk is a preliminary estimate based on available information using one of  several models endorsed by the [place] (ACS). The ACS recommends consideration of breast MRI screening as an adjunct to mammography for patients at high risk (defined as 20% or greater lifetime risk).   ***Keith [name] has been determined to be at high risk for breast cancer.  Therefore, we recommend that annual screening with mammography and breast MRI be performed.  ***begin at age 45, or 10 years prior to the age of breast cancer diagnosis in a relative (whichever is earlier).  We discussed that Keith [name] should discuss her individual situation with her referring physician and determine a breast cancer screening plan with which they are both comfortable.    PLAN: After considering the risks, benefits, and limitations, Keith [name] provided informed consent to pursue genetic testing and the blood sample was sent to {Lab} Laboratories for analysis of the {test}. Results should be available within approximately {TAT TIME} weeks' time, at which point they will be disclosed by telephone to Mr. Boardwine, as will any additional recommendations warranted by these results. Mr. Funaro will receive a summary of his genetic counseling visit and a copy of his results once available. This information will also be available in Epic.   *** Despite our  recommendation, Mr. Cockburn did not wish to pursue genetic testing at today's visit. We understand this decision and remain available to coordinate genetic testing at any time in the future. We, therefore, recommend Mr. Therriault continue to follow the cancer screening guidelines given by his primary healthcare provider.  ***Based on Mr. Goettel's family history, we recommended his ***, who was diagnosed with *** at age ***, have genetic counseling and testing. Mr. Condit will let us know if we can be of any assistance in coordinating genetic counseling and/or testing for this family member.   Lastly, we encouraged Mr. Bedgood to remain in contact with cancer genetics annually so that we can continuously update the family history and inform him of any changes in cancer genetics and testing that may be of benefit for this family.   Mr. Sicotte questions were answered to his satisfaction today. Our contact information was provided should additional questions or concerns arise. Thank you for the referral and allowing [place] to share in the care of your patient.   Joni Colegrove M. Joette Catching, [place], [facility] Genetic Counselor [email or web address] (P) [phone]  The patient was seen for a total of *** minutes in face-to-face genetic counseling.  ***The was accompanied by ***.  ***The patient was seen alone.  Drs. Lindi Adie and/or Burr Medico were available to discuss this case as needed.    _______________________________________________________________________ For Office Staff:  Number of people involved in session: *** Was an Intern/ student involved with case: {YES/NO:63}

## 2022-10-17 ENCOUNTER — Inpatient Hospital Stay: Payer: Medicare HMO

## 2022-10-17 ENCOUNTER — Encounter: Payer: Self-pay | Admitting: Genetic Counselor

## 2022-10-17 ENCOUNTER — Other Ambulatory Visit: Payer: Self-pay

## 2022-10-17 ENCOUNTER — Inpatient Hospital Stay: Payer: Medicare HMO | Attending: Hematology | Admitting: Genetic Counselor

## 2022-10-17 DIAGNOSIS — C679 Malignant neoplasm of bladder, unspecified: Secondary | ICD-10-CM

## 2022-10-17 DIAGNOSIS — Z808 Family history of malignant neoplasm of other organs or systems: Secondary | ICD-10-CM | POA: Diagnosis not present

## 2022-10-17 DIAGNOSIS — Z803 Family history of malignant neoplasm of breast: Secondary | ICD-10-CM

## 2022-10-17 DIAGNOSIS — C671 Malignant neoplasm of dome of bladder: Secondary | ICD-10-CM

## 2022-10-17 LAB — GENETIC SCREENING ORDER

## 2022-10-23 DIAGNOSIS — D090 Carcinoma in situ of bladder: Secondary | ICD-10-CM | POA: Diagnosis not present

## 2022-11-14 ENCOUNTER — Other Ambulatory Visit: Payer: Self-pay

## 2022-11-19 ENCOUNTER — Telehealth: Payer: Self-pay | Admitting: Genetic Counselor

## 2022-11-19 ENCOUNTER — Encounter: Payer: Self-pay | Admitting: Genetic Counselor

## 2022-11-19 ENCOUNTER — Ambulatory Visit: Payer: Self-pay | Admitting: Genetic Counselor

## 2022-11-19 DIAGNOSIS — C679 Malignant neoplasm of bladder, unspecified: Secondary | ICD-10-CM

## 2022-11-19 DIAGNOSIS — Z1379 Encounter for other screening for genetic and chromosomal anomalies: Secondary | ICD-10-CM

## 2022-11-19 DIAGNOSIS — Z808 Family history of malignant neoplasm of other organs or systems: Secondary | ICD-10-CM

## 2022-11-19 DIAGNOSIS — Z803 Family history of malignant neoplasm of breast: Secondary | ICD-10-CM

## 2022-11-19 NOTE — Telephone Encounter (Signed)
Disclosed negative genetic results to wife, Chyrl Civatte.  Takeo unavailable at time of call.  Chyrl Civatte agreed to ask Teak to call me tomorrow to discuss results in more detail.

## 2022-11-27 ENCOUNTER — Other Ambulatory Visit (HOSPITAL_BASED_OUTPATIENT_CLINIC_OR_DEPARTMENT_OTHER): Payer: Self-pay | Admitting: Hematology

## 2022-11-27 DIAGNOSIS — C671 Malignant neoplasm of dome of bladder: Secondary | ICD-10-CM

## 2022-12-02 ENCOUNTER — Telehealth: Payer: Self-pay | Admitting: Hematology

## 2022-12-02 DIAGNOSIS — E119 Type 2 diabetes mellitus without complications: Secondary | ICD-10-CM | POA: Diagnosis not present

## 2022-12-02 DIAGNOSIS — H40021 Open angle with borderline findings, high risk, right eye: Secondary | ICD-10-CM | POA: Diagnosis not present

## 2022-12-02 DIAGNOSIS — H2512 Age-related nuclear cataract, left eye: Secondary | ICD-10-CM | POA: Diagnosis not present

## 2022-12-02 DIAGNOSIS — Z961 Presence of intraocular lens: Secondary | ICD-10-CM | POA: Diagnosis not present

## 2022-12-02 NOTE — Telephone Encounter (Signed)
Contacted patient to scheduled appointments. Patient is aware of appointments that are scheduled.   

## 2022-12-03 ENCOUNTER — Other Ambulatory Visit: Payer: Self-pay

## 2022-12-04 ENCOUNTER — Encounter: Payer: Self-pay | Admitting: Hematology

## 2022-12-04 NOTE — Progress Notes (Signed)
HPI:   Keith [name] was previously seen in the [facility] due to a personal history of bladder cancer, a family history of breast cancer, and concerns regarding a hereditary predisposition to cancer. Genetic testing results and recommendations are discussed in more detail below.  CANCER HISTORY:  Oncology History  Bladder cancer  09/12/2020 Initial Diagnosis   Bladder cancer ([facility])   09/12/2020 Cancer Staging   Staging form: Urinary Bladder, AJCC 8th Edition - Clinical: Stage I (cT1, cN0, cM0) - Signed by Benjiman Core, MD on 09/12/2020 WHO/ISUP grade (low/high): High Grade Histologic grading system: 2 grade system   06/24/2022 Pathology Results             FINAL MICROSCOPIC DIAGNOSIS:  A. BLADDER, BIOPSY: Focal urothelial carcinoma in situ (CIS)     06/24/2022 Cancer Staging   Staging form: Urinary Bladder, AJCC 8th Edition - Pathologic stage from 06/24/2022: Stage 0is (pTis, pN0, cM0) - Signed by Malachy Mood, MD on 08/24/2022 Stage prefix: Initial diagnosis WHO/ISUP grade (low/high): High Grade Histologic grading system: 2 grade system   08/28/2022 - 08/28/2022 Chemotherapy   Patient is on Treatment Plan : BLADDER Gemcitabine INTRAVESICAL (2000) q7d     09/12/2022 -  Chemotherapy   Patient is on Treatment Plan : BLADDER Mitomycin INTRAVESICAL (40 mg) q7d     10/25/2022 Genetic Testing   Negative [facility] CancerNext-Expanded +RNA Panel.  Report date is 10/25/2022.   The CancerNext-Expanded gene panel offered by [facility] and includes sequencing, rearrangement, and RNA analysis for the following 77 genes: AIP, ALK, APC, ATM, AXIN2, BAP1, BARD1, BLM, BMPR1A, BRCA1, BRCA2, BRIP1, CDC73, CDH1, CDK4, CDKN1B, CDKN2A, CHEK2, CTNNA1, DICER1, FANCC, FH, FLCN, GALNT12, KIF1B, LZTR1, MAX, MEN1, MET, MLH1, MSH2, MSH3, MSH6, MUTYH, NBN, NF1, NF2, NTHL1, PALB2, PHOX2B, PMS2, POT1, PRKAR1A, PTCH1, PTEN, RAD51C, RAD51D, RB1, RECQL, RET, SDHA, SDHAF2, SDHB, SDHC, SDHD,  SMAD4, SMARCA4, SMARCB1, SMARCE1, STK11, SUFU, TMEM127, TP53, TSC1, TSC2, VHL and XRCC2 (sequencing and deletion/duplication); EGFR, EGLN1, HOXB13, KIT, MITF, PDGFRA, POLD1, and POLE (sequencing only); EPCAM and GREM1 (deletion/duplication only).    Malignant neoplasm of trigone of urinary bladder  09/12/2020 Initial Diagnosis   Malignant neoplasm of trigone of urinary bladder ([facility])   09/26/2020 - 10/31/2020 Chemotherapy   Patient is on Treatment Plan : BLADDER Gemcitabine q7d       FAMILY HISTORY:  We obtained a detailed, 4-generation family history.  Significant diagnoses are listed below:      Family History  Problem Relation Age of Onset   Breast cancer Sister 2   Breast cancer Sister 84        d. 63   Breast cancer Sister 26   Brain cancer Sister          dx and d. 19s   Bladder Cancer Brother 51   Lung cancer Brother          dx after 11   Lung cancer Brother 49   Skin cancer Daughter          melanoma and BCC in 41s   Cancer Nephew          3 nephews with lung, kidney, and unknown ca dx before age 18       Keith [name] reported that his daughter had genetic testing and had a "skin cancer gene that could be passed down."  No report was available for review today.  He did not report other family history of hereditary cancer genetic testing.  Other affected  relatives are unavailable for genetic testing at this time.   GENETIC TEST RESULTS:  The [facility] CancerNext-Expanded +RNAinsight Panel found no pathogenic mutations.   The CancerNext-Expanded gene panel offered by [facility] and includes sequencing, rearrangement, and RNA analysis for the following 71 genes:  AIP, ALK, APC, ATM, BAP1, BARD1, BMPR1A, BRCA1, BRCA2, BRIP1, CDC73, CDH1, CDK4, CDKN1B, CDKN2A, CHEK2, DICER1, FH, FLCN, KIF1B, LZTR1,MAX, MEN1, MET, MLH1, MSH2, MSH6, MUTYH, NF1, NF2, NTHL1, PALB2, PHOX2B, PMS2, POT1, PRKAR1A, PTCH1, PTEN, RAD51C,RAD51D, RB1, RET, SDHA, SDHAF2, SDHB, SDHC, SDHD, SMAD4, SMARCA4,  SMARCB1, SMARCE1, STK11, SUFU, TMEM127, TP53,TSC1, TSC2 and VHL (sequencing and deletion/duplication); AXIN2, CTNNA1, EGFR, EGLN1, HOXB13, KIT, MITF, MSH3, PDGFRA, POLD1 and POLE (sequencing only); EPCAM and GREM1 (deletion/duplication only).  .   The test report has been scanned into EPIC and is located under the Molecular Pathology section of the Results Review tab.  A portion of the result report is included below for reference. Genetic testing reported out on October 25, 2022.      Even though a pathogenic variant was not identified, possible explanations for the cancer in the family may include: There may be no hereditary risk for cancer in the family. The cancers in Keith [name] and/or his family may be sporadic/familial or due to other genetic and environmental factors. There may be a gene mutation in one of these genes that current testing methods cannot detect but that chance is small. There could be another gene that has not yet been discovered, or that we have not yet tested, that is responsible for the cancer diagnoses in the family.  It is also possible there is a hereditary cause for the cancer in the family that Keith [name] did not inherit.  Therefore, it is important to remain in touch with cancer genetics in the future so that we can continue to offer Keith [name] the most up to date genetic testing.     ADDITIONAL GENETIC TESTING:  We discussed with Keith [name] that his genetic testing was fairly extensive.  If there are additional relevant genes identified to increase cancer risk that can be analyzed in the future, we would be happy to discuss and coordinate this testing at that time.      CANCER SCREENING RECOMMENDATIONS:  Keith [name] test result is considered negative (normal).  This means that we have not identified a hereditary cause for his personal history of bladder cancer at this time.   An individual's cancer risk and medical management are not determined by  genetic test results alone. Overall cancer risk assessment incorporates additional factors, including personal medical history, family history, and any available genetic information that may result in a personalized plan for cancer prevention and surveillance. Therefore, it is recommended he continue to follow the cancer management and screening guidelines provided by his oncology and primary healthcare provider.   RECOMMENDATIONS FOR FAMILY MEMBERS:   Since he did not inherit a identifiable mutation in a cancer predisposition gene included on this panel, his children could not have inherited a known mutation from him in one of these genes. Individuals in this family might be at some increased risk of developing cancer, over the general population risk, due to the family history of cancer.  Individuals in the family should notify their providers of the family history of cancer. We recommend women in this family have a yearly mammogram beginning at age 77, or 9 years younger than the earliest onset of cancer, an annual clinical breast exam, and perform monthly  breast self-exams.  Other members of the family may still carry a pathogenic variant in one of these genes that Keith [name] did not inherit. Based on the family history, we recommend his sisters with a breast cancer history  have genetic counseling and testing. Keith [name] will let us know if we can be of any assistance in coordinating genetic counseling and/or testing for this family member.     FOLLOW-UP:  Lastly, we discussed with Keith [name] that cancer genetics is a rapidly advancing field and it is possible that new genetic tests will be appropriate for him and/or his family members in the future. We encouraged him to remain in contact with cancer genetics on an annual basis so we can update his personal and family histories and let him know of advances in cancer genetics that may benefit this family.   Our contact number was provided. Mr.  [name] questions were answered to his satisfaction, and he knows he is welcome to call us at anytime with additional questions or concerns.   Ica Daye M. Rennie Plowman, MS, [facility] Genetic Counselor [email or web address] (P) [phone]

## 2022-12-05 NOTE — Assessment & Plan Note (Signed)
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 He received Intravesicular gemcitabine with a total of 2000 mg weekly X6 started in February 2022  -bladder biopsies, bladder washings on 06/24/2022 showed focal urothelial carcinoma in situ (CIS) and cytology was positive for high grade urothelial carcinoma -I recommended intravesical gemcitabine 2000 mg weekly x 6, he started on 08/28/16.  He tolerated first cycle poorly with significant hematuria, and intermittent fever for week after treatment. -Due to his poor tolerance to intravesical gemcitabine, I changed therapy to intravesical mitomycin 40 mg every week for 5 weeks. He tolerated the treatment very well  -His surveillance cystoscopy and biopsy were negative for residual cancer -Given his good response to intravesical mitomycin, and his high risk of recurrence, Dr. Retta Diones recommend small intravesical therapy.  I discussed the date of maintenance mitomycin monthly for 3 years, which showed significant reduce of recurrence rate.  Patient agrees to proceed, will start next week.

## 2022-12-06 ENCOUNTER — Encounter: Payer: Self-pay | Admitting: Hematology

## 2022-12-06 ENCOUNTER — Inpatient Hospital Stay: Payer: Medicare HMO

## 2022-12-06 ENCOUNTER — Inpatient Hospital Stay: Payer: Medicare HMO | Attending: Hematology | Admitting: Hematology

## 2022-12-06 ENCOUNTER — Other Ambulatory Visit: Payer: Self-pay

## 2022-12-06 VITALS — BP 151/77 | HR 67 | Temp 98.2°F | Resp 18 | Ht 69.0 in | Wt 185.6 lb

## 2022-12-06 VITALS — BP 142/71 | HR 68 | Resp 18

## 2022-12-06 DIAGNOSIS — C679 Malignant neoplasm of bladder, unspecified: Secondary | ICD-10-CM | POA: Diagnosis not present

## 2022-12-06 DIAGNOSIS — Z5111 Encounter for antineoplastic chemotherapy: Secondary | ICD-10-CM | POA: Insufficient documentation

## 2022-12-06 DIAGNOSIS — C671 Malignant neoplasm of dome of bladder: Secondary | ICD-10-CM

## 2022-12-06 DIAGNOSIS — C670 Malignant neoplasm of trigone of bladder: Secondary | ICD-10-CM | POA: Insufficient documentation

## 2022-12-06 DIAGNOSIS — Z79899 Other long term (current) drug therapy: Secondary | ICD-10-CM | POA: Insufficient documentation

## 2022-12-06 LAB — CMP (CANCER CENTER ONLY)
ALT: 16 U/L (ref 0–44)
AST: 17 U/L (ref 15–41)
Albumin: 4.4 g/dL (ref 3.5–5.0)
Alkaline Phosphatase: 47 U/L (ref 38–126)
Anion gap: 6 (ref 5–15)
BUN: 12 mg/dL (ref 8–23)
CO2: 29 mmol/L (ref 22–32)
Calcium: 9.6 mg/dL (ref 8.9–10.3)
Chloride: 106 mmol/L (ref 98–111)
Creatinine: 0.96 mg/dL (ref 0.61–1.24)
GFR, Estimated: >60 mL/min (ref >60)
Glucose, Bld: 107 mg/dL — ABNORMAL HIGH (ref 70–99)
Potassium: 4.1 mmol/L (ref 3.5–5.1)
Sodium: 141 mmol/L (ref 135–145)
Total Bilirubin: 0.3 mg/dL (ref 0.3–1.2)
Total Protein: 7.1 g/dL (ref 6.5–8.1)

## 2022-12-06 LAB — CBC WITH DIFFERENTIAL (CANCER CENTER ONLY)
Abs Immature Granulocytes: 0.02 10*3/uL (ref 0.00–0.07)
Basophils Absolute: 0.1 10*3/uL (ref 0.0–0.1)
Basophils Relative: 1 %
Eosinophils Absolute: 0.3 10*3/uL (ref 0.0–0.5)
Eosinophils Relative: 5 %
HCT: 38.4 % — ABNORMAL LOW (ref 39.0–52.0)
Hemoglobin: 13 g/dL (ref 13.0–17.0)
Immature Granulocytes: 0 %
Lymphocytes Relative: 29 %
Lymphs Abs: 1.5 10*3/uL (ref 0.7–4.0)
MCH: 30.7 pg (ref 26.0–34.0)
MCHC: 33.9 g/dL (ref 30.0–36.0)
MCV: 90.6 fL (ref 80.0–100.0)
Monocytes Absolute: 0.6 10*3/uL (ref 0.1–1.0)
Monocytes Relative: 11 %
Neutro Abs: 2.8 10*3/uL (ref 1.7–7.7)
Neutrophils Relative %: 54 %
Platelet Count: 186 10*3/uL (ref 150–400)
RBC: 4.24 MIL/uL (ref 4.22–5.81)
RDW: 13.3 % (ref 11.5–15.5)
WBC Count: 5.3 10*3/uL (ref 4.0–10.5)
nRBC: 0 % (ref 0.0–0.2)

## 2022-12-06 MED ORDER — OXYBUTYNIN CHLORIDE 5 MG PO TABS
5.0000 mg | ORAL_TABLET | Freq: Once | ORAL | Status: AC | PRN
  Administered 2022-12-06: 5 mg via ORAL

## 2022-12-06 MED ORDER — PROCHLORPERAZINE MALEATE 10 MG PO TABS
10.0000 mg | ORAL_TABLET | Freq: Once | ORAL | Status: AC
  Administered 2022-12-06: 10 mg via ORAL
  Filled 2022-12-06: qty 1

## 2022-12-06 MED ORDER — MITOMYCIN CHEMO FOR BLADDER INSTILLATION 40 MG
40.0000 mg | Freq: Once | INTRAVENOUS | Status: AC
  Administered 2022-12-06: 40 mg via INTRAVESICAL
  Filled 2022-12-06: qty 40

## 2022-12-06 MED ORDER — LIDOCAINE HCL URETHRAL/MUCOSAL 2 % EX GEL
1.0000 | Freq: Once | CUTANEOUS | Status: AC
  Administered 2022-12-06: 1 via URETHRAL
  Filled 2022-12-06: qty 1

## 2022-12-06 NOTE — Progress Notes (Signed)
[name]   Telephone:([phone] Fax:([phone]) [phone]   Clinic Follow up Note   Patient Care Team: Charlane Ferretti, DO as PCP - General (Internal Medicine) Malachy Mood, MD as Attending Physician (Hematology and Oncology) Marcine Matar, MD as Consulting Physician (Urology)  Date of Service:  12/06/2022  CHIEF COMPLAINT: f/u of bladder cancer   CURRENT THERAPY:   Intravesical Mytomycin (40 mg) q4 wks for 3 years  ASSESSMENT:  Keith [name] is a 82 y.o. male with   Bladder cancer (HCC) -initially diagnosed in 2017  -He was found to have high-grade urothelial carcinoma without muscle invasion and developed and subsequently BCG refractory disease in December 2021. He received Intravesicular gemcitabine with a total of 2000 mg weekly X6 started in February 2022  -bladder biopsies, bladder washings on 06/24/2022 showed focal urothelial carcinoma in situ (CIS) and cytology was positive for high grade urothelial carcinoma -I recommended intravesical gemcitabine 2000 mg weekly x 6, he started on 08/28/16.  He tolerated first cycle poorly with significant hematuria, and intermittent fever for week after treatment. -Due to his poor tolerance to intravesical gemcitabine, I changed therapy to intravesical mitomycin 40 mg every week for 5 weeks. He tolerated the treatment very well  -His surveillance cystoscopy and biopsy were negative for residual cancer -Given his good response to intravesical mitomycin, and his high risk of recurrence, Dr. Retta Diones recommend small intravesical therapy.  I discussed the date of maintenance mitomycin monthly for 3 years, which showed significant reduce of recurrence rate.  Patient agrees to proceed, will start next week.    PLAN: -recommend maintenance Mitomycin monthly for 3 year to reduce recurrence - Pt agreed to proceed. -lab and f/u in 4 months   SUMMARY OF ONCOLOGIC HISTORY: Oncology History Overview Note   Cancer Staging   Bladder cancer [facility]) Staging form: Urinary Bladder, AJCC 8th Edition - Clinical: Stage I (cT1, cN0, cM0) - Signed by Benjiman Core, MD on 09/12/2020 WHO/ISUP grade (low/high): High Grade Histologic grading system: 2 grade system - Pathologic stage from 06/24/2022: Stage 0is (pTis, pN0, cM0) - Signed by Malachy Mood, MD on 08/24/2022 Stage prefix: Initial diagnosis WHO/ISUP grade (low/high): High Grade Histologic grading system: 2 grade system     Bladder cancer (HCC)  09/12/2020 Initial Diagnosis   Bladder cancer (HCC)   09/12/2020 Cancer Staging   Staging form: Urinary Bladder, AJCC 8th Edition - Clinical: Stage I (cT1, cN0, cM0) - Signed by Benjiman Core, MD on 09/12/2020 WHO/ISUP grade (low/high): High Grade Histologic grading system: 2 grade system   06/24/2022 Pathology Results             FINAL MICROSCOPIC DIAGNOSIS:  A. BLADDER, BIOPSY: Focal urothelial carcinoma in situ (CIS)     06/24/2022 Cancer Staging   Staging form: Urinary Bladder, AJCC 8th Edition - Pathologic stage from 06/24/2022: Stage 0is (pTis, pN0, cM0) - Signed by Malachy Mood, MD on 08/24/2022 Stage prefix: Initial diagnosis WHO/ISUP grade (low/high): High Grade Histologic grading system: 2 grade system   08/28/2022 - 08/28/2022 Chemotherapy   Patient is on Treatment Plan : BLADDER Gemcitabine INTRAVESICAL (2000) q7d     09/12/2022 -  Chemotherapy   Patient is on Treatment Plan : BLADDER Mitomycin INTRAVESICAL (40 mg) q7d     10/25/2022 Genetic Testing   Negative [facility] CancerNext-Expanded +RNA Panel.  Report date is 10/25/2022.   The CancerNext-Expanded gene panel offered by [employer] and includes sequencing, rearrangement, and RNA analysis for the following 77 genes:  AIP, ALK, APC, ATM, AXIN2, BAP1, BARD1, BLM, BMPR1A, BRCA1, BRCA2, BRIP1, CDC73, CDH1, CDK4, CDKN1B, CDKN2A, CHEK2, CTNNA1, DICER1, FANCC, FH, FLCN, GALNT12, KIF1B, LZTR1, MAX, MEN1, MET, MLH1, MSH2, MSH3, MSH6, MUTYH, NBN, NF1, NF2,  NTHL1, PALB2, PHOX2B, PMS2, POT1, PRKAR1A, PTCH1, PTEN, RAD51C, RAD51D, RB1, RECQL, RET, SDHA, SDHAF2, SDHB, SDHC, SDHD, SMAD4, SMARCA4, SMARCB1, SMARCE1, STK11, SUFU, TMEM127, TP53, TSC1, TSC2, VHL and XRCC2 (sequencing and deletion/duplication); EGFR, EGLN1, HOXB13, KIT, MITF, PDGFRA, POLD1, and POLE (sequencing only); EPCAM and GREM1 (deletion/duplication only).    Malignant neoplasm of trigone of urinary bladder (HCC)  09/12/2020 Initial Diagnosis   Malignant neoplasm of trigone of urinary bladder (HCC)   09/26/2020 - 10/31/2020 Chemotherapy   Patient is on Treatment Plan : BLADDER Gemcitabine q7d        INTERVAL HISTORY: Keith [name] is here for a follow up of bladder cancer . He was last seen by me on 10/09/2022. He presents to the clinic accompanied by wife. Pt state that he still feels fatigue. Pt over all is doing well.       All other systems were reviewed with the patient and are negative.  MEDICAL HISTORY:  Past Medical History:  Diagnosis Date   Bladder cancer (HCC)    Blind right eye    BPH (benign prostatic hypertrophy)    Cataracts, both eyes    Depression    DM type 2 (diabetes mellitus, type 2) (HCC)    DOE (dyspnea on exertion)    with heavy exertion   Full dentures    GERD (gastroesophageal reflux disease)    Glaucoma no peripheral or side vision   right eye blind   Glaucoma, right eye    HOH (hard of hearing)    Hyperlipidemia    Hypertension    Hypothyroidism    Local recurrence of cancer of urinary bladder (HCC) urologsit-  dr Retta Diones   1st dx 01-29-2016--  now recurrent 05-31-2016   Lower extremity edema 07/11/2020   right swells more than left goes down at night after sleeping   Lower urinary tract symptoms (LUTS) 2019   Migraine    Peripheral neuropathy    middle finger left hand   Pneumonia 2019   Skin abnormality    base of spine looks like pimple has had since 07-01-2020, no drainage area intact   Wears glasses     SURGICAL  HISTORY: Past Surgical History:  Procedure Laterality Date   ANTERIOR CERVICAL DECOMP/DISCECTOMY FUSION  08-15-2000   C5 -- C6   ANTERIOR CERVICAL DECOMP/DISCECTOMY FUSION  03-04-2008   C6 -- C7   areas removed from back at spine  2021 jan and aug 2021   CYSTOSCOPY W/ RETROGRADES Left 05/11/2020   Procedure: Cystoscopy with left retrograde ureteropyelogram, resection of probable bladder tumor and left trigonal/ureteral orifice region, 3 cm in size, fluoroscopic interpretation, left ureteroscopy, placement of 6 French by 26 cm contour double-J stent without tether;  Surgeon: Marcine Matar, MD;  Location: [facility];  Service: Urology;  Laterality: Left;   CYSTOSCOPY W/ RETROGRADES Bilateral 06/24/2022   Procedure: CYSTOSCOPY WITH RETROGRADE PYELOGRAM;  Surgeon: Marcine Matar, MD;  Location: [facility];  Service: Urology;  Laterality: Bilateral;  1 HR   CYSTOSCOPY WITH BIOPSY N/A 01/29/2016   Procedure: CYSTOSCOPY WITH BIOPSY;  Surgeon: Marcine Matar, MD;  Location: [facility];  Service: Urology;  Laterality: N/A;   CYSTOSCOPY WITH BIOPSY N/A 05/31/2016   Procedure: CYSTOSCOPY WITH BIOPSY;  Surgeon: Marcine Matar, MD;  Location: [facility];  Service: Urology;  Laterality: N/A;   CYSTOSCOPY WITH BIOPSY N/A 06/24/2022   Procedure: CYSTOSCOPY WITH BIOPSY;  Surgeon: Marcine Matar, MD;  Location: [facility];  Service: Urology;  Laterality: N/A;   EYE SURGERY Right 1998 & 2000   [place]   GLAUCOMA SURGERY  1998 and 2000   lower back  06/01/2019, 02-10-2020   nose cancer  areas removed  2019, 2021   x 2 / 3/ 2021 small area removed from nose   surgery for sacral fracture  40 yrs ago   TRANSURETHRAL RESECTION OF BLADDER TUMOR N/A 07/17/2020   Procedure: TRANSURETHRAL RESECTION OF BLADDER TUMOR (TURBT);  Surgeon: Marcine Matar, MD;  Location: [facility];  Service: Urology;  Laterality: N/A;    I have reviewed the social  history and family history with the patient and they are unchanged from previous note.  ALLERGIES:  has No Known Allergies.  MEDICATIONS:  Current Outpatient Medications  Medication Sig Dispense Refill   acetaminophen (TYLENOL) 500 MG tablet Take 1,000 mg by mouth every 8 (eight) hours as needed for moderate pain.     amLODipine (NORVASC) 10 MG tablet Take 10 mg by mouth daily.     Aspirin-Salicylamide-Caffeine (ARTHRITIS STRENGTH BC POWDER PO) Take 1 packet by mouth daily as needed (pain). (Patient not taking: Reported on 08/28/2022)     atorvastatin (LIPITOR) 20 MG tablet Take 20 mg by mouth at bedtime.      carteolol (OCUPRESS) 1 % ophthalmic solution Place 1 drop into the right eye every morning.     cetirizine (ZYRTEC) 10 MG tablet Take 10 mg by mouth daily as needed for allergies.     clotrimazole-betamethasone (LOTRISONE) lotion Apply 1 Application topically daily as needed (irritation).     fluticasone (FLONASE) 50 MCG/ACT nasal spray Place 2 sprays into both nostrils daily as needed for allergies.      glimepiride (AMARYL) 1 MG tablet Take 1 mg by mouth daily with breakfast.     ibuprofen (ADVIL) 200 MG tablet Take 600 mg by mouth every 8 (eight) hours as needed for moderate pain. (Patient not taking: Reported on 08/28/2022)     latanoprost (XALATAN) 0.005 % ophthalmic solution Place 1 drop into the right eye at bedtime.     levothyroxine (SYNTHROID, LEVOTHROID) 100 MCG tablet Take 100 mcg by mouth daily before breakfast.      losartan (COZAAR) 100 MG tablet Take 100 mg by mouth at bedtime.      meloxicam (MOBIC) 15 MG tablet Take 15 mg by mouth daily.     Misc Natural Products (COMPLETE PROSTATE HEALTH PO) Take 1 tablet by mouth 2 (two) times daily.     omeprazole (PRILOSEC) 20 MG capsule Take 20 mg by mouth daily.     oxybutynin (DITROPAN-XL) 5 MG 24 hr tablet TAKE 1 TABLET (5 MG TOTAL) BY MOUTH ONCE A WEEK. TAKE 2 HOURS BEFORE SCHEDULED CHEMO IN OUR OFFICE 90 tablet 1   sitaGLIPtin  (JANUVIA) 100 MG tablet Take 100 mg by mouth every morning.     tamsulosin (FLOMAX) 0.4 MG CAPS capsule Take 0.4 mg by mouth every morning.      vitamin B-12 (CYANOCOBALAMIN) 500 MCG tablet Take 500 mcg by mouth daily.     No current facility-administered medications for this visit.    PHYSICAL EXAMINATION: ECOG PERFORMANCE STATUS: 0 - Asymptomatic  Vitals:   12/06/22 0921  BP: (!) 151/77  Pulse: 67  Resp: 18  Temp:  98.2 F (36.8 C)  SpO2: 99%   Wt Readings from Last 3 Encounters:  12/06/22 185 lb 9.6 oz (84.2 kg)  10/09/22 184 lb 8 oz (83.7 kg)  09/25/22 183 lb 6.4 oz (83.2 kg)     GENERAL:alert, no distress and comfortable SKIN: skin color normal, no rashes or significant lesions EYES: normal, Conjunctiva are pink and non-injected, sclera clear  NEURO: alert & oriented x 3 with fluent speech  LABORATORY DATA:  I have reviewed the data as listed    Latest Ref Rng & Units 12/06/2022    8:43 AM 10/09/2022   11:35 AM 09/25/2022    8:56 AM  CBC  WBC 4.0 - 10.5 K/uL 5.3  5.1  6.1   Hemoglobin 13.0 - 17.0 g/dL 16.1  09.6  04.5   Hematocrit 39.0 - 52.0 % 38.4  36.3  35.5   Platelets 150 - 400 K/uL 186  171  243         Latest Ref Rng & Units 12/06/2022    8:43 AM 10/09/2022   11:35 AM 09/25/2022    8:56 AM  CMP  Glucose 70 - 99 mg/dL 409  811  914   BUN 8 - 23 mg/dL 12  13  16    Creatinine 0.61 - 1.24 mg/dL 7.82  9.56  2.13   Sodium 135 - 145 mmol/L 141  139  138   Potassium 3.5 - 5.1 mmol/L 4.1  3.8  4.0   Chloride 98 - 111 mmol/L 106  107  105   CO2 22 - 32 mmol/L 29  27  27    Calcium 8.9 - 10.3 mg/dL 9.6  9.0  9.3   Total Protein 6.5 - 8.1 g/dL 7.1  6.8  7.2   Total Bilirubin 0.3 - 1.2 mg/dL 0.3  0.4  0.5   Alkaline Phos 38 - 126 U/L 47  52  66   AST 15 - 41 U/L 17  17  18    ALT 0 - 44 U/L 16  19  21        RADIOGRAPHIC STUDIES: I have personally reviewed the radiological images as listed and agreed with the findings in the report. No results found.     Orders Placed This Encounter  Procedures   CBC with Differential (Cancer Center Only)    Standing Status:   Future    Standing Expiration Date:   01/01/2024   CMP (Cancer Center only)    Standing Status:   Future    Standing Expiration Date:   01/01/2024   CBC with Differential (Cancer Center Only)    Standing Status:   Future    Standing Expiration Date:   01/29/2024   CMP (Cancer Center only)    Standing Status:   Future    Standing Expiration Date:   01/29/2024   CBC with Differential (Cancer Center Only)    Standing Status:   Future    Standing Expiration Date:   02/26/2024   CMP (Cancer Center only)    Standing Status:   Future    Standing Expiration Date:   02/26/2024   CBC with Differential (Cancer Center Only)    Standing Status:   Future    Standing Expiration Date:   03/25/2024   CMP (Cancer Center only)    Standing Status:   Future    Standing Expiration Date:   03/25/2024   All questions were answered. The patient knows to call the clinic with any problems, questions or concerns.  No barriers to learning was detected. The total time spent in the appointment was 25 minutes.     Malachy Mood, MD 12/06/2022   Carolin Coy, CMA, am acting as scribe for Malachy Mood, MD.   I have reviewed the above documentation for accuracy and completeness, and I agree with the above.

## 2022-12-06 NOTE — Patient Instructions (Signed)
[facility] [place] [facility]  Discharge Instructions: Thank you for choosing [facility] to provide your oncology and hematology care.   If you have a lab appointment with the Cancer Center, please go directly to the Cancer Center and check in at the registration area.   Wear comfortable clothing and clothing appropriate for easy access to any Portacath or PICC line.   We strive to give you quality time with your provider. You may need to reschedule your appointment if you arrive late (15 or more minutes).  Arriving late affects you and other patients whose appointments are after yours.  Also, if you miss three or more appointments without notifying the office, you may be dismissed from the clinic at the provider's discretion.      For prescription refill requests, have your pharmacy contact our office and allow 72 hours for refills to be completed.    Today you received the following chemotherapy and/or immunotherapy agents: Mitomycin      To help prevent nausea and vomiting after your treatment, we encourage you to take your nausea medication as directed.  BELOW ARE SYMPTOMS THAT SHOULD BE REPORTED IMMEDIATELY: *FEVER GREATER THAN 100.4 F (38 C) OR HIGHER *CHILLS OR SWEATING *NAUSEA AND VOMITING THAT IS NOT CONTROLLED WITH YOUR NAUSEA MEDICATION *UNUSUAL SHORTNESS OF BREATH *UNUSUAL BRUISING OR BLEEDING *URINARY PROBLEMS (pain or burning when urinating, or frequent urination) *BOWEL PROBLEMS (unusual diarrhea, constipation, pain near the anus) TENDERNESS IN MOUTH AND THROAT WITH OR WITHOUT PRESENCE OF ULCERS (sore throat, sores in mouth, or a toothache) UNUSUAL RASH, SWELLING OR PAIN  UNUSUAL VAGINAL DISCHARGE OR ITCHING   Items with * indicate a potential emergency and should be followed up as soon as possible or go to the Emergency Department if any problems should occur.  Please show the CHEMOTHERAPY ALERT CARD or IMMUNOTHERAPY ALERT CARD at  check-in to the Emergency Department and triage nurse.  Should you have questions after your visit or need to cancel or reschedule your appointment, please contact [facility] [facility]  Dept: [phone]  and follow the prompts.  Office hours are 8:00 a.m. to 4:30 p.m. Monday - Friday. Please note that voicemails left after 4:00 p.m. may not be returned until the following business day.  We are closed weekends and major holidays. You have access to a nurse at all times for urgent questions. Please call the main number to the clinic Dept: [phone] and follow the prompts.   For any non-urgent questions, you may also contact your provider using MyChart. We now offer e-Visits for anyone 18 and older to request care online for non-urgent symptoms. For details visit [email or web address].   Also download the MyChart app! Go to the app store, search "MyChart", open the app, select [place], and log in with your MyChart username and password.   

## 2022-12-06 NOTE — Progress Notes (Signed)
Pt states he took sodium bicarb and 5mg  Ditropan at home prior to appt.   18G catheter inserted using sterile technique. 30cc balloon inflated. urine output observed prior to procedure. 5mg  Ditropan given immediately prior to instillation. Catheter clamped, Mutamycin instilled over 12 minutes. Dwell time started at 1058. Pt spent 30 minutes on back and 30 minutes on right side. At 1hr mark, pt reported experiencing bladder spasms and was observed to be leaking profusely around 18G catheter. Catheter unclamped and urine drained. present in foley bag after draining. MD Mosetta Putt notified and cleared pt for discharge. 1hr total dwell time obtained. 18G catheter removed without issue.

## 2022-12-07 ENCOUNTER — Other Ambulatory Visit: Payer: Self-pay

## 2022-12-17 ENCOUNTER — Telehealth: Payer: Self-pay | Admitting: Hematology

## 2022-12-23 DIAGNOSIS — C44311 Basal cell carcinoma of skin of nose: Secondary | ICD-10-CM | POA: Diagnosis not present

## 2022-12-26 DIAGNOSIS — R5383 Other fatigue: Secondary | ICD-10-CM | POA: Diagnosis not present

## 2022-12-26 DIAGNOSIS — E039 Hypothyroidism, unspecified: Secondary | ICD-10-CM | POA: Diagnosis not present

## 2022-12-26 DIAGNOSIS — I10 Essential (primary) hypertension: Secondary | ICD-10-CM | POA: Diagnosis not present

## 2022-12-26 DIAGNOSIS — R5381 Other malaise: Secondary | ICD-10-CM | POA: Diagnosis not present

## 2022-12-26 DIAGNOSIS — E785 Hyperlipidemia, unspecified: Secondary | ICD-10-CM | POA: Diagnosis not present

## 2022-12-26 DIAGNOSIS — D649 Anemia, unspecified: Secondary | ICD-10-CM | POA: Diagnosis not present

## 2022-12-26 DIAGNOSIS — G319 Degenerative disease of nervous system, unspecified: Secondary | ICD-10-CM | POA: Diagnosis not present

## 2022-12-26 DIAGNOSIS — E1169 Type 2 diabetes mellitus with other specified complication: Secondary | ICD-10-CM | POA: Diagnosis not present

## 2022-12-26 DIAGNOSIS — D692 Other nonthrombocytopenic purpura: Secondary | ICD-10-CM | POA: Diagnosis not present

## 2022-12-26 DIAGNOSIS — C679 Malignant neoplasm of bladder, unspecified: Secondary | ICD-10-CM | POA: Diagnosis not present

## 2023-01-01 ENCOUNTER — Ambulatory Visit: Payer: Medicare HMO

## 2023-01-01 ENCOUNTER — Other Ambulatory Visit: Payer: Self-pay

## 2023-01-01 ENCOUNTER — Inpatient Hospital Stay: Payer: Medicare HMO | Attending: Hematology

## 2023-01-01 VITALS — BP 127/70 | HR 76 | Temp 98.1°F | Resp 19

## 2023-01-01 DIAGNOSIS — Z5111 Encounter for antineoplastic chemotherapy: Secondary | ICD-10-CM | POA: Diagnosis not present

## 2023-01-01 DIAGNOSIS — C670 Malignant neoplasm of trigone of bladder: Secondary | ICD-10-CM | POA: Diagnosis not present

## 2023-01-01 DIAGNOSIS — C671 Malignant neoplasm of dome of bladder: Secondary | ICD-10-CM

## 2023-01-01 MED ORDER — LIDOCAINE HCL URETHRAL/MUCOSAL 2 % EX GEL
1.0000 | Freq: Once | CUTANEOUS | Status: AC
  Administered 2023-01-01: 1 via URETHRAL
  Filled 2023-01-01: qty 1

## 2023-01-01 MED ORDER — MITOMYCIN CHEMO FOR BLADDER INSTILLATION 40 MG
40.0000 mg | Freq: Once | INTRAVENOUS | Status: AC
  Administered 2023-01-01: 40 mg via INTRAVESICAL
  Filled 2023-01-01: qty 40

## 2023-01-01 MED ORDER — OXYBUTYNIN CHLORIDE 5 MG PO TABS
5.0000 mg | ORAL_TABLET | Freq: Once | ORAL | Status: AC | PRN
  Administered 2023-01-01: 5 mg via ORAL
  Filled 2023-01-01: qty 1

## 2023-01-01 MED ORDER — PROCHLORPERAZINE MALEATE 10 MG PO TABS
10.0000 mg | ORAL_TABLET | Freq: Once | ORAL | Status: AC
  Administered 2023-01-01: 10 mg via ORAL
  Filled 2023-01-01: qty 1

## 2023-01-01 NOTE — Patient Instructions (Signed)
[place] [facility]  Discharge Instructions: Thank you for choosing [facility] to provide your oncology and hematology care.   If you have a lab appointment with the Cancer Center, please go directly to the Cancer Center and check in at the registration area.   Wear comfortable clothing and clothing appropriate for easy access to any Portacath or PICC line.   We strive to give you quality time with your provider. You may need to reschedule your appointment if you arrive late (15 or more minutes).  Arriving late affects you and other patients whose appointments are after yours.  Also, if you miss three or more appointments without notifying the office, you may be dismissed from the clinic at the provider's discretion.      For prescription refill requests, have your pharmacy contact our office and allow 72 hours for refills to be completed.    Today you received the following chemotherapy and/or immunotherapy agents Mutamycin      To help prevent nausea and vomiting after your treatment, we encourage you to take your nausea medication as directed.  BELOW ARE SYMPTOMS THAT SHOULD BE REPORTED IMMEDIATELY: *FEVER GREATER THAN 100.4 F (38 C) OR HIGHER *CHILLS OR SWEATING *NAUSEA AND VOMITING THAT IS NOT CONTROLLED WITH YOUR NAUSEA MEDICATION *UNUSUAL SHORTNESS OF BREATH *UNUSUAL BRUISING OR BLEEDING *URINARY PROBLEMS (pain or burning when urinating, or frequent urination) *BOWEL PROBLEMS (unusual diarrhea, constipation, pain near the anus) TENDERNESS IN MOUTH AND THROAT WITH OR WITHOUT PRESENCE OF ULCERS (sore throat, sores in mouth, or a toothache) UNUSUAL RASH, SWELLING OR PAIN  UNUSUAL VAGINAL DISCHARGE OR ITCHING   Items with * indicate a potential emergency and should be followed up as soon as possible or go to the Emergency Department if any problems should occur.  Please show the CHEMOTHERAPY ALERT CARD or IMMUNOTHERAPY ALERT CARD at  check-in to the Emergency Department and triage nurse.  Should you have questions after your visit or need to cancel or reschedule your appointment, please contact [place] [facility]  Dept: [phone]  and follow the prompts.  Office hours are 8:00 a.m. to 4:30 p.m. Monday - Friday. Please note that voicemails left after 4:00 p.m. may not be returned until the following business day.  We are closed weekends and major holidays. You have access to a nurse at all times for urgent questions. Please call the main number to the clinic Dept: [phone] and follow the prompts.   For any non-urgent questions, you may also contact your provider using MyChart. We now offer e-Visits for anyone 73 and older to request care online for non-urgent symptoms. For details visit [email or web address].   Also download the MyChart app! Go to the app store, search "MyChart", open the app, select [place], and log in with your MyChart username and password.

## 2023-01-01 NOTE — Progress Notes (Signed)
Per Dr. Mosetta Putt, "OK To drain & remove foley catheter d/t increased leakage around catheter".

## 2023-01-01 NOTE — Progress Notes (Signed)
Pt states he took  5mg  Ditropan at home prior to appt this morning and the night before.    18G catheter inserted using sterile technique. 30cc balloon inflated. urine output observed prior to procedure. 5mg  Ditropan given immediately prior to instillation.  Catheter clamped, Mutamycin instilled over 14 minutes. Dwell time started at 1025. Pt spent 30 minutes on back . At that time, pt reported leaking and was observed to be leaking profusely around 18G catheter.   Lacie NP was notified and the infusion was stopped. urine output noted post-tx.

## 2023-01-23 DIAGNOSIS — H2512 Age-related nuclear cataract, left eye: Secondary | ICD-10-CM | POA: Diagnosis not present

## 2023-01-29 ENCOUNTER — Inpatient Hospital Stay: Payer: Medicare HMO | Attending: Hematology

## 2023-01-29 ENCOUNTER — Other Ambulatory Visit: Payer: Self-pay

## 2023-01-29 ENCOUNTER — Inpatient Hospital Stay: Payer: Medicare HMO

## 2023-01-29 ENCOUNTER — Inpatient Hospital Stay: Payer: Medicare HMO | Admitting: Hematology

## 2023-01-29 ENCOUNTER — Encounter: Payer: Self-pay | Admitting: Hematology

## 2023-01-29 ENCOUNTER — Ambulatory Visit: Payer: Medicare HMO

## 2023-01-29 VITALS — BP 136/68 | HR 72 | Temp 97.7°F | Resp 18 | Ht 69.0 in | Wt 181.8 lb

## 2023-01-29 VITALS — BP 132/68 | HR 75 | Temp 98.2°F | Resp 18

## 2023-01-29 DIAGNOSIS — C670 Malignant neoplasm of trigone of bladder: Secondary | ICD-10-CM | POA: Insufficient documentation

## 2023-01-29 DIAGNOSIS — Z79899 Other long term (current) drug therapy: Secondary | ICD-10-CM | POA: Insufficient documentation

## 2023-01-29 DIAGNOSIS — C671 Malignant neoplasm of dome of bladder: Secondary | ICD-10-CM | POA: Diagnosis not present

## 2023-01-29 DIAGNOSIS — Z5111 Encounter for antineoplastic chemotherapy: Secondary | ICD-10-CM | POA: Insufficient documentation

## 2023-01-29 DIAGNOSIS — C679 Malignant neoplasm of bladder, unspecified: Secondary | ICD-10-CM

## 2023-01-29 LAB — CMP (CANCER CENTER ONLY)
ALT: 17 U/L (ref 0–44)
AST: 16 U/L (ref 15–41)
Albumin: 4.1 g/dL (ref 3.5–5.0)
Alkaline Phosphatase: 50 U/L (ref 38–126)
Anion gap: 6 (ref 5–15)
BUN: 14 mg/dL (ref 8–23)
CO2: 30 mmol/L (ref 22–32)
Calcium: 9.3 mg/dL (ref 8.9–10.3)
Chloride: 103 mmol/L (ref 98–111)
Creatinine: 0.94 mg/dL (ref 0.61–1.24)
GFR, Estimated: >60 mL/min (ref >60)
Glucose, Bld: 175 mg/dL — ABNORMAL HIGH (ref 70–99)
Potassium: 3.7 mmol/L (ref 3.5–5.1)
Sodium: 139 mmol/L (ref 135–145)
Total Bilirubin: 0.5 mg/dL (ref 0.3–1.2)
Total Protein: 6.8 g/dL (ref 6.5–8.1)

## 2023-01-29 LAB — CBC WITH DIFFERENTIAL (CANCER CENTER ONLY)
Abs Immature Granulocytes: 0.03 10*3/uL (ref 0.00–0.07)
Basophils Absolute: 0.1 10*3/uL (ref 0.0–0.1)
Basophils Relative: 1 %
Eosinophils Absolute: 0.3 10*3/uL (ref 0.0–0.5)
Eosinophils Relative: 5 %
HCT: 39.1 % (ref 39.0–52.0)
Hemoglobin: 13 g/dL (ref 13.0–17.0)
Immature Granulocytes: 1 %
Lymphocytes Relative: 26 %
Lymphs Abs: 1.7 10*3/uL (ref 0.7–4.0)
MCH: 30.1 pg (ref 26.0–34.0)
MCHC: 33.2 g/dL (ref 30.0–36.0)
MCV: 90.5 fL (ref 80.0–100.0)
Monocytes Absolute: 0.6 10*3/uL (ref 0.1–1.0)
Monocytes Relative: 9 %
Neutro Abs: 3.7 10*3/uL (ref 1.7–7.7)
Neutrophils Relative %: 58 %
Platelet Count: 165 10*3/uL (ref 150–400)
RBC: 4.32 MIL/uL (ref 4.22–5.81)
RDW: 13.2 % (ref 11.5–15.5)
WBC Count: 6.4 10*3/uL (ref 4.0–10.5)
nRBC: 0 % (ref 0.0–0.2)

## 2023-01-29 MED ORDER — MITOMYCIN CHEMO FOR BLADDER INSTILLATION 40 MG
40.0000 mg | Freq: Once | INTRAVENOUS | Status: AC
  Administered 2023-01-29: 40 mg via INTRAVESICAL
  Filled 2023-01-29: qty 40

## 2023-01-29 MED ORDER — LIDOCAINE HCL URETHRAL/MUCOSAL 2 % EX GEL
1.0000 | Freq: Once | CUTANEOUS | Status: AC
  Administered 2023-01-29: 1 via URETHRAL
  Filled 2023-01-29: qty 1

## 2023-01-29 MED ORDER — OXYBUTYNIN CHLORIDE 5 MG PO TABS
10.0000 mg | ORAL_TABLET | Freq: Once | ORAL | Status: AC | PRN
  Administered 2023-01-29: 10 mg via ORAL
  Filled 2023-01-29: qty 2

## 2023-01-29 MED ORDER — PROCHLORPERAZINE MALEATE 10 MG PO TABS
10.0000 mg | ORAL_TABLET | Freq: Once | ORAL | Status: AC
  Administered 2023-01-29: 10 mg via ORAL
  Filled 2023-01-29: qty 1

## 2023-01-29 NOTE — Progress Notes (Deleted)
[facility]   [phone] Fax:([phone]   Clinic Follow up Note   Patient Care Team: Charlane Ferretti, DO as PCP - General (Internal Medicine) Malachy Mood, MD as Attending Physician (Hematology and Oncology) Marcine Matar, MD as Consulting Physician (Urology)  Date of Service:  01/29/2023  CHIEF COMPLAINT: f/u of bladder cancer    CURRENT THERAPY:   Intravesical Mytomycin (40 mg) q4 wks for 3 years   ASSESSMENT: *** Keith [name] is a 82 y.o. male with   No problem-specific Assessment & Plan notes found for this encounter.  ***   PLAN:    SUMMARY OF ONCOLOGIC HISTORY: Oncology History Overview Note   Cancer Staging  Bladder cancer (HCC) Staging form: Urinary Bladder, AJCC 8th Edition - Clinical: Stage I (cT1, cN0, cM0) - Signed by Benjiman Core, MD on 09/12/2020 WHO/ISUP grade (low/high): High Grade Histologic grading system: 2 grade system - Pathologic stage from 06/24/2022: Stage 0is (pTis, pN0, cM0) - Signed by Malachy Mood, MD on 08/24/2022 Stage prefix: Initial diagnosis WHO/ISUP grade (low/high): High Grade Histologic grading system: 2 grade system     Bladder cancer (HCC)  09/12/2020 Initial Diagnosis   Bladder cancer (HCC)   09/12/2020 Cancer Staging   Staging form: Urinary Bladder, AJCC 8th Edition - Clinical: Stage I (cT1, cN0, cM0) - Signed by Benjiman Core, MD on 09/12/2020 WHO/ISUP grade (low/high): High Grade Histologic grading system: 2 grade system   06/24/2022 Pathology Results             FINAL MICROSCOPIC DIAGNOSIS:  A. BLADDER, BIOPSY: Focal urothelial carcinoma in situ (CIS)     06/24/2022 Cancer Staging   Staging form: Urinary Bladder, AJCC 8th Edition - Pathologic stage from 06/24/2022: Stage 0is (pTis, pN0, cM0) - Signed by Malachy Mood, MD on 08/24/2022 Stage prefix: Initial diagnosis WHO/ISUP grade (low/high): High Grade Histologic grading system: 2 grade system   08/28/2022 - 08/28/2022  Chemotherapy   Patient is on Treatment Plan : BLADDER Gemcitabine INTRAVESICAL (2000) q7d     09/12/2022 -  Chemotherapy   Patient is on Treatment Plan : BLADDER Mitomycin INTRAVESICAL (40 mg) q7d     10/25/2022 Genetic Testing   Negative [facility] CancerNext-Expanded +RNA Panel.  Report date is 10/25/2022.   The CancerNext-Expanded gene panel offered by [facility] and includes sequencing, rearrangement, and RNA analysis for the following 77 genes: AIP, ALK, APC, ATM, AXIN2, BAP1, BARD1, BLM, BMPR1A, BRCA1, BRCA2, BRIP1, CDC73, CDH1, CDK4, CDKN1B, CDKN2A, CHEK2, CTNNA1, DICER1, FANCC, FH, FLCN, GALNT12, KIF1B, LZTR1, MAX, MEN1, MET, MLH1, MSH2, MSH3, MSH6, MUTYH, NBN, NF1, NF2, NTHL1, PALB2, PHOX2B, PMS2, POT1, PRKAR1A, PTCH1, PTEN, RAD51C, RAD51D, RB1, RECQL, RET, SDHA, SDHAF2, SDHB, SDHC, SDHD, SMAD4, SMARCA4, SMARCB1, SMARCE1, STK11, SUFU, TMEM127, TP53, TSC1, TSC2, VHL and XRCC2 (sequencing and deletion/duplication); EGFR, EGLN1, HOXB13, KIT, MITF, PDGFRA, POLD1, and POLE (sequencing only); EPCAM and GREM1 (deletion/duplication only).    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  MEDICAL HISTORY:  Past Medical History:  Diagnosis Date   Bladder cancer (HCC)    Blind right eye    BPH (benign  prostatic hypertrophy)    Cataracts, both eyes    Depression    DM type 2 (diabetes mellitus, type 2) (HCC)    DOE (dyspnea on exertion)    with heavy exertion   Full dentures    GERD (gastroesophageal reflux disease)    Glaucoma no peripheral or side vision   right eye blind   Glaucoma, right eye    HOH (hard of hearing)    Hyperlipidemia    Hypertension     Hypothyroidism    Local recurrence of cancer of urinary bladder [facility] [address] [address]) urologsit-  dr Retta Diones   1st dx 01-29-2016--  now recurrent 05-31-2016   Lower extremity edema 07/11/2020   right swells more than left goes down at night after sleeping   Lower urinary tract symptoms (LUTS) 2019   Migraine    Peripheral neuropathy    middle finger left hand   Pneumonia 2019   Skin abnormality    base of spine looks like pimple has had since 07-01-2020, no drainage area intact   Wears glasses     SURGICAL HISTORY: Past Surgical History:  Procedure Laterality Date   ANTERIOR CERVICAL DECOMP/DISCECTOMY FUSION  08-15-2000   C5 -- C6   ANTERIOR CERVICAL DECOMP/DISCECTOMY FUSION  03-04-2008   C6 -- C7   areas removed from back at spine  2021 jan and aug 2021   CYSTOSCOPY W/ RETROGRADES Left 05/11/2020   Procedure: Cystoscopy with left retrograde ureteropyelogram, resection of probable bladder tumor and left trigonal/ureteral orifice region, 3 cm in size, fluoroscopic interpretation, left ureteroscopy, placement of 6 French by 26 cm contour double-J stent without tether;  Surgeon: Marcine Matar, MD;  Location: [facility];  Service: Urology;  Laterality: Left;   CYSTOSCOPY W/ RETROGRADES Bilateral 06/24/2022   Procedure: CYSTOSCOPY WITH RETROGRADE PYELOGRAM;  Surgeon: Marcine Matar, MD;  Location: [facility];  Service: Urology;  Laterality: Bilateral;  1 HR   CYSTOSCOPY WITH BIOPSY N/A 01/29/2016   Procedure: CYSTOSCOPY WITH BIOPSY;  Surgeon: Marcine Matar, MD;  Location: [facility];  Service: Urology;  Laterality: N/A;   CYSTOSCOPY WITH BIOPSY N/A 05/31/2016   Procedure: CYSTOSCOPY WITH BIOPSY;  Surgeon: Marcine Matar, MD;  Location: [facility];  Service: Urology;  Laterality: N/A;   CYSTOSCOPY WITH BIOPSY N/A 06/24/2022   Procedure: CYSTOSCOPY WITH BIOPSY;  Surgeon: Marcine Matar, MD;  Location[facility];  Service: Urology;  Laterality:  N/A;   EYE SURGERY Right 1998 & 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  ALLERGIES:  has No Known Allergies.  MEDICATIONS:  Current Outpatient Medications  Medication Sig Dispense Refill   acetaminophen (TYLENOL) 500 MG tablet Take 1,000 mg by mouth every 8 (eight) hours as needed for moderate pain.     amLODipine (NORVASC) 10 MG tablet Take 10 mg by mouth daily.     Aspirin-Salicylamide-Caffeine (ARTHRITIS STRENGTH BC POWDER PO) Take 1 packet by  mouth daily as needed (pain). (Patient not taking: Reported on 08/28/2022)     atorvastatin (LIPITOR) 20 MG tablet Take 20 mg by mouth at bedtime.      carteolol (OCUPRESS) 1 % ophthalmic solution Place 1 drop into the right eye every morning.     cetirizine (ZYRTEC) 10 MG tablet Take 10 mg by mouth daily as needed for allergies.     clotrimazole-betamethasone (LOTRISONE) lotion Apply 1 Application topically daily as needed (irritation).     fluticasone (FLONASE) 50 MCG/ACT nasal spray Place 2 sprays into both nostrils daily as needed for allergies.      glimepiride (AMARYL) 1 MG tablet Take 1 mg by mouth daily with breakfast.     ibuprofen (ADVIL) 200 MG tablet Take 600 mg by mouth every 8 (eight) hours as needed for moderate pain. (Patient not taking: Reported on 08/28/2022)     latanoprost (XALATAN) 0.005 % ophthalmic solution Place 1 drop into the right eye at bedtime.     levothyroxine (SYNTHROID, LEVOTHROID) 100 MCG tablet Take  100 mcg by mouth daily before breakfast.      losartan (COZAAR) 100 MG tablet Take 100 mg by mouth at bedtime.      meloxicam (MOBIC) 15 MG tablet Take 15 mg by mouth daily.     Misc Natural Products (COMPLETE PROSTATE HEALTH PO) Take 1 tablet by mouth 2 (two) times daily.     omeprazole (PRILOSEC) 20 MG capsule Take 20 mg by mouth daily.     oxybutynin (DITROPAN-XL) 5 MG 24 hr tablet TAKE 1 TABLET (5 MG TOTAL) BY MOUTH ONCE A WEEK. TAKE 2 HOURS BEFORE SCHEDULED CHEMO IN OUR OFFICE 90 tablet 1   sitaGLIPtin (JANUVIA) 100 MG tablet Take 100 mg by mouth every morning.     tamsulosin (FLOMAX) 0.4 MG CAPS capsule Take 0.4 mg by mouth every morning.      vitamin B-12 (CYANOCOBALAMIN) 500 MCG tablet Take 500 mcg by mouth daily.     No current facility-administered medications for this visit.    PHYSICAL EXAMINATION: ECOG PERFORMANCE STATUS: {CHL ONC ECOG PS:[phone]}  There were no vitals filed for this visit. Wt Readings from Last 3 Encounters:  12/06/22 185 lb 9.6 oz (84.2 kg)  10/09/22 184 lb 8 oz (83.7 kg)  09/25/22 183 lb 6.4 oz (83.2 kg)    {Only keep what was examined. If exam not performed, can use .CEXAM } GENERAL:alert, no distress and comfortable SKIN: skin color, texture, turgor are normal, no rashes or significant lesions EYES: normal, Conjunctiva are pink and non-injected, sclera clear {OROPHARYNX:no exudate, no erythema and lips, buccal mucosa, and tongue normal}  NECK: supple, thyroid normal size, non-tender, without nodularity LYMPH:  no palpable lymphadenopathy in the cervical, axillary {or inguinal} LUNGS: clear to auscultation and percussion with normal breathing effort HEART: regular rate & rhythm and no murmurs and no lower extremity edema ABDOMEN:abdomen soft, non-tender and normal bowel sounds Musculoskeletal:no cyanosis of digits and no clubbing  NEURO: alert & oriented x 3 with fluent speech, no focal motor/sensory deficits  LABORATORY DATA:  I have reviewed  the data as listed    Latest Ref Rng & Units 12/06/2022    8:43 AM 10/09/2022   11:35 AM 09/25/2022    8:56 AM  CBC  WBC 4.0 - 10.5 K/uL 5.3  5.1  6.1   Hemoglobin 13.0 - 17.0 g/dL 16.1  09.6  04.5   Hematocrit 39.0 - 52.0 % 38.4  36.3  35.5   Platelets  150 - 400 K/uL 186  171  243         Latest Ref Rng & Units 12/06/2022    8:43 AM 10/09/2022   11:35 AM 09/25/2022    8:56 AM  CMP  Glucose 70 - 99 mg/dL 161  096  045   BUN 8 - 23 mg/dL 12  13  16    Creatinine 0.61 - 1.24 mg/dL 4.09  8.11  9.14   Sodium 135 - 145 mmol/L 141  139  138   Potassium 3.5 - 5.1 mmol/L 4.1  3.8  4.0   Chloride 98 - 111 mmol/L 106  107  105   CO2 22 - 32 mmol/L 29  27  27    Calcium 8.9 - 10.3 mg/dL 9.6  9.0  9.3   Total Protein 6.5 - 8.1 g/dL 7.1  6.8  7.2   Total Bilirubin 0.3 - 1.2 mg/dL 0.3  0.4  0.5   Alkaline Phos 38 - 126 U/L 47  52  66   AST 15 - 41 U/L 17  17  18    ALT 0 - 44 U/L 16  19  21        RADIOGRAPHIC STUDIES: I have personally reviewed the radiological images as listed and agreed with the findings in the report. No results found.    No orders of the defined types were placed in this encounter.  All questions were answered. The patient knows to call the clinic with any problems, questions or concerns. No barriers to learning was detected. The total time spent in the appointment was {CHL ONC TIME VISIT - [id number]}.     Verlee Rossetti, CMA 01/29/2023   I, Sharlette Dense, CMA, am acting as scribe for Malachy Mood, MD.   {Add scribe attestation statement}

## 2023-01-29 NOTE — Progress Notes (Signed)
[name]   Telephone:([phone] Fax:(336) [phone]   Clinic Follow up Note    Patient Care Team: Charlane Ferretti, DO as PCP - General (Internal Medicine) Malachy Mood, MD as Attending Physician (Hematology and Oncology) Marcine Matar, MD as Consulting Physician (Urology)   Date of Service:  01/29/2023   CHIEF COMPLAINT: f/u of bladder cancer    CURRENT THERAPY:   Intravesical Mytomycin (40 mg) q4 wks for 3 years   ASSESSMENT:  Keith [name] is a 82 y.o. 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 He received Intravesicular gemcitabine with a total of 2000 mg weekly X6 started in February 2022  -bladder biopsies, bladder washings on 06/24/2022 showed focal urothelial carcinoma in situ (CIS) and cytology was positive for high grade urothelial carcinoma -I recommended intravesical gemcitabine 2000 mg weekly x 6, he started on 08/28/16.  He tolerated first cycle poorly with significant hematuria, and intermittent fever for week after treatment. -Due to his poor tolerance to intravesical gemcitabine, I changed therapy to intravesical mitomycin 40 mg every week for 5 weeks. He tolerated the treatment very well  -His surveillance cystoscopy and biopsy were negative for residual cancer -Given his good response to intravesical mitomycin, and his high risk of recurrence, Dr. Retta Diones recommend more intravesical therapy.  I discussed the data of maintenance mitomycin monthly for 3 years, which showed significant reduce of recurrence rate.  Patient agrees to proceed, he started in 11/2022 -he being tolerating intravesical mitomycin overall well, but did have leakage earlier, I will increase his oxybutynin dose before each treatment, and hopefully it will minimize leakage. -He also reports slightly  worsening low back pain and leg weakness, not sure if it is related to his intravesical chemotherapy, I offered a months chemo break, he declined, wants to continue for now. -Follow-up in 3 months     PLAN: -Will continue monthly intravesical mitomycin, I will increase oxybutynin dose before each treatment, hopefully that will reduce his leakage  follow up with Orthopedic surgeon for his back pain and leg weakness - f/u in 3 months w/labs      SUMMARY OF ONCOLOGIC HISTORY:     Oncology History Overview Note    Cancer Staging  Bladder cancer [facility]) Staging form: Urinary Bladder, AJCC 8th Edition - Clinical: Stage I (cT1, cN0, cM0) - Signed by Benjiman Core, MD on 09/12/2020 WHO/ISUP grade (low/high): High Grade Histologic grading system: 2 grade system - Pathologic stage from 06/24/2022: Stage 0is (pTis, pN0, cM0) - Signed by Malachy Mood, MD on 08/24/2022 Stage prefix: Initial diagnosis WHO/ISUP grade (low/high): High Grade Histologic grading system: 2 grade system        Bladder cancer (HCC)   09/12/2020 Initial Diagnosis     Bladder cancer (HCC)     09/12/2020 Cancer Staging     Staging form: Urinary Bladder, AJCC 8th Edition - Clinical: Stage I (cT1, cN0, cM0) - Signed by Benjiman Core, MD on 09/12/2020 WHO/ISUP grade (low/high): High Grade Histologic grading system: 2 grade system     06/24/2022 Pathology Results               FINAL MICROSCOPIC DIAGNOSIS:  A. BLADDER, BIOPSY: Focal urothelial carcinoma in situ (CIS)        06/24/2022 Cancer Staging     Staging form: Urinary Bladder, AJCC 8th  Edition - Pathologic stage from 06/24/2022: Stage 0is (pTis, pN0, cM0) - Signed by Malachy Mood, MD on 08/24/2022 Stage prefix: Initial diagnosis WHO/ISUP grade (low/high): High Grade Histologic grading system: 2 grade system     08/28/2022 - 08/28/2022 Chemotherapy     Patient is on Treatment Plan : BLADDER Gemcitabine INTRAVESICAL (2000) q7d      09/12/2022 -  Chemotherapy      Patient is on Treatment Plan : BLADDER Mitomycin INTRAVESICAL (40 mg) q7d      10/25/2022 Genetic Testing     Negative [facility] CancerNext-Expanded +RNA Panel.  Report date is 10/25/2022.    The CancerNext-Expanded gene panel offered by [facility] and includes sequencing, rearrangement, and RNA analysis for the following 77 genes: AIP, ALK, APC, ATM, AXIN2, BAP1, BARD1, BLM, BMPR1A, BRCA1, BRCA2, BRIP1, CDC73, CDH1, CDK4, CDKN1B, CDKN2A, CHEK2, CTNNA1, DICER1, FANCC, FH, FLCN, GALNT12, KIF1B, LZTR1, MAX, MEN1, MET, MLH1, MSH2, MSH3, MSH6, MUTYH, NBN, NF1, NF2, NTHL1, PALB2, PHOX2B, PMS2, POT1, PRKAR1A, PTCH1, PTEN, RAD51C, RAD51D, RB1, RECQL, RET, SDHA, SDHAF2, SDHB, SDHC, SDHD, SMAD4, SMARCA4, SMARCB1, SMARCE1, STK11, SUFU, TMEM127, TP53, TSC1, TSC2, VHL and XRCC2 (sequencing and deletion/duplication); EGFR, EGLN1, HOXB13, KIT, MITF, PDGFRA, POLD1, and POLE (sequencing only); EPCAM and GREM1 (deletion/duplication only).      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 He was last seen by me on 12/06/2022. He presents to the clinic accompanied by wife. Patient reports lower back pain from a slipped disc. Taking OTC pain meds. Patient reports leg weakness with the last couple of treatments. He reports leakage with the last two treatments.          All other systems were reviewed with the patient and are negative.   MEDICAL HISTORY:      Past Medical History:  Diagnosis Date   Bladder cancer (HCC)     Blind right eye     BPH (benign prostatic hypertrophy)     Cataracts, both eyes     Depression     DM type 2 (diabetes mellitus, type 2) (HCC)     DOE (dyspnea on exertion)      with heavy exertion    Full dentures     GERD (gastroesophageal reflux disease)     Glaucoma no peripheral or side vision    right eye blind   Glaucoma, right eye     HOH (hard of hearing)     Hyperlipidemia     Hypertension     Hypothyroidism     Local recurrence of cancer of urinary bladder (HCC) urologsit-  dr Retta Diones    1st dx 01-29-2016--  now recurrent 05-31-2016   Lower extremity edema 07/11/2020    right swells more than left goes down at night after sleeping   Lower urinary tract symptoms (LUTS) 2019   Migraine     Peripheral neuropathy      middle finger left hand   Pneumonia 2019   Skin abnormality      base of spine looks like pimple has had since 07-01-2020, no drainage area intact   Wears glasses  SURGICAL HISTORY:      Past Surgical History:  Procedure Laterality Date   ANTERIOR CERVICAL DECOMP/DISCECTOMY FUSION   08-15-2000    C5 -- C6   ANTERIOR CERVICAL DECOMP/DISCECTOMY FUSION   03-04-2008    C6 -- C7   areas removed from back at spine   2021 jan and aug 2021   CYSTOSCOPY W/ RETROGRADES Left 05/11/2020    Procedure: Cystoscopy with left retrograde ureteropyelogram, resection of probable bladder tumor and left trigonal/ureteral orifice region, 3 cm in size, fluoroscopic interpretation, left ureteroscopy, placement of 6 French by 26 cm contour double-J stent without tether;  Surgeon: Marcine Matar, MD;  Location: [facility];  Service: Urology;  Laterality: Left;   CYSTOSCOPY W/ RETROGRADES Bilateral 06/24/2022    Procedure: CYSTOSCOPY WITH RETROGRADE PYELOGRAM;  Surgeon: Marcine Matar, MD;  Location[facility];  Service: Urology;  Laterality: Bilateral;  1 HR   CYSTOSCOPY WITH BIOPSY N/A 01/29/2016    Procedure: CYSTOSCOPY WITH BIOPSY;  Surgeon: Marcine Matar, MD;  Location: [facility];  Service: Urology;  Laterality: N/A;   CYSTOSCOPY WITH BIOPSY N/A 05/31/2016    Procedure: CYSTOSCOPY WITH BIOPSY;  Surgeon: Marcine Matar, MD;   Location: [facility];  Service: Urology;  Laterality: N/A;   CYSTOSCOPY WITH BIOPSY N/A 06/24/2022    Procedure: CYSTOSCOPY WITH BIOPSY;  Surgeon: Marcine Matar, MD;  Location[facility];  Service: Urology;  Laterality: N/A;   EYE SURGERY Right 1998 & 2000    Gluacoma   GLAUCOMA SURGERY   1998 and 2000   lower back   06/01/2019, 02-10-2020   nose cancer  areas removed   2019, 2021    x 2 / 3/ 2021 small area removed from nose   surgery for sacral fracture   40 yrs ago   TRANSURETHRAL RESECTION OF BLADDER TUMOR N/A 07/17/2020    Procedure: TRANSURETHRAL RESECTION OF BLADDER TUMOR (TURBT);  Surgeon: Marcine Matar, MD;  Location: [facility];  Service: Urology;  Laterality: N/A;      I have reviewed the social history and family history with the patient and they are unchanged from previous note.   ALLERGIES:  has No Known Allergies.   MEDICATIONS:        Current Outpatient Medications  Medication Sig Dispense Refill   acetaminophen (TYLENOL) 500 MG tablet Take 1,000 mg by mouth every 8 (eight) hours as needed for moderate pain.       amLODipine (NORVASC) 10 MG tablet Take 10 mg by mouth daily.       Aspirin-Salicylamide-Caffeine (ARTHRITIS STRENGTH BC POWDER PO) Take 1 packet by mouth daily as needed (pain). (Patient not taking: Reported on 08/28/2022)       atorvastatin (LIPITOR) 20 MG tablet Take 20 mg by mouth at bedtime.        carteolol (OCUPRESS) 1 % ophthalmic solution Place 1 drop into the right eye every morning.       cetirizine (ZYRTEC) 10 MG tablet Take 10 mg by mouth daily as needed for allergies.       clotrimazole-betamethasone (LOTRISONE) lotion Apply 1 Application topically daily as needed (irritation).       fluticasone (FLONASE) 50 MCG/ACT nasal spray Place 2 sprays into both nostrils daily as needed for allergies.        glimepiride (AMARYL) 1 MG tablet Take 1 mg by mouth daily with breakfast.       ibuprofen (ADVIL) 200 MG tablet Take  600 mg by mouth  every 8 (eight) hours as needed for moderate pain. (Patient not taking: Reported on 08/28/2022)       latanoprost (XALATAN) 0.005 % ophthalmic solution Place 1 drop into the right eye at bedtime.       levothyroxine (SYNTHROID, LEVOTHROID) 100 MCG tablet Take 100 mcg by mouth daily before breakfast.        losartan (COZAAR) 100 MG tablet Take 100 mg by mouth at bedtime.        meloxicam (MOBIC) 15 MG tablet Take 15 mg by mouth daily.       Misc Natural Products (COMPLETE PROSTATE HEALTH PO) Take 1 tablet by mouth 2 (two) times daily.       omeprazole (PRILOSEC) 20 MG capsule Take 20 mg by mouth daily.       oxybutynin (DITROPAN-XL) 5 MG 24 hr tablet TAKE 1 TABLET (5 MG TOTAL) BY MOUTH ONCE A WEEK. TAKE 2 HOURS BEFORE SCHEDULED CHEMO IN OUR OFFICE 90 tablet 1   sitaGLIPtin (JANUVIA) 100 MG tablet Take 100 mg by mouth every morning.       tamsulosin (FLOMAX) 0.4 MG CAPS capsule Take 0.4 mg by mouth every morning.        vitamin B-12 (CYANOCOBALAMIN) 500 MCG tablet Take 500 mcg by mouth daily.        No current facility-administered medications for this visit.      PHYSICAL EXAMINATION: ECOG PERFORMANCE STATUS: 0 - Asymptomatic   BP 136/68 (BP Location: Right Arm, Patient Position: Sitting)   Pulse 72   Temp 97.7 F (36.5 C) (Oral)   Resp 18   Ht 5\' 9"  (1.753 m)   Wt 181 lb 12.8 oz (82.5 kg)   SpO2 97%   BMI 26.85 kg/m    GENERAL:alert, no distress and comfortable SKIN: skin color normal, no rashes or significant lesions EYES: normal, Conjunctiva are pink and non-injected, sclera clear  NEURO: alert & oriented x 3 with fluent speech   LABORATORY DATA:  I have reviewed the data as listed     Latest Ref Rng & Units 01/29/2023    8:33 AM 12/06/2022    8:43 AM 10/09/2022   11:35 AM  CBC  WBC 4.0 - 10.5 K/uL 6.4  5.3  5.1   Hemoglobin 13.0 - 17.0 g/dL 16.1  09.6  04.5   Hematocrit 39.0 - 52.0 % 39.1  38.4  36.3   Platelets 150 - 400 K/uL 165  186  171       Latest  Ref Rng & Units 01/29/2023    8:33 AM 12/06/2022    8:43 AM 10/09/2022   11:35 AM  CMP  Glucose 70 - 99 mg/dL 409  811  914   BUN 8 - 23 mg/dL 14  12  13    Creatinine 0.61 - 1.24 mg/dL 7.82  9.56  2.13   Sodium 135 - 145 mmol/L 139  141  139   Potassium 3.5 - 5.1 mmol/L 3.7  4.1  3.8   Chloride 98 - 111 mmol/L 103  106  107   CO2 22 - 32 mmol/L 30  29  27    Calcium 8.9 - 10.3 mg/dL 9.3  9.6  9.0   Total Protein 6.5 - 8.1 g/dL 6.8  7.1  6.8   Total Bilirubin 0.3 - 1.2 mg/dL 0.5  0.3  0.4   Alkaline Phos 38 - 126 U/L 50  47  52   AST 15 - 41 U/L 16  17  17  ALT 0 - 44 U/L 17  16  19             RADIOGRAPHIC STUDIES: I have personally reviewed the radiological images as listed and agreed with the findings in the report. Imaging Results (Last 48 hours)  No results found.        All questions were answered. The patient knows to call the clinic with any problems, questions or concerns. No barriers to learning was detected. The total time spent in the appointment was 25 minutes.      Malachy Mood, MD 01/29/2023  I, Sharlette Dense, CMA, am acting as scribe for Malachy Mood, MD.    I have reviewed the above documentation for accuracy and completeness, and I agree with the above.

## 2023-01-29 NOTE — Progress Notes (Signed)
  Patient tool 5 mg of Ditropan at home prior to appt. He also received 10 mg of Ditropan prior to Mutamycin. 18 G Catheter inserted with sterile technique 30CC Balloon inflated. 275 mLs of urine output prior to procedure. Catheter clamped, Mutamycin instilled over 11 minutes. 30 minutes in patient reported leaking around cath. Site. Patient was repositioned and tolerated about 15 minutes more. Catheter started leaking again. Medication was discontinued per provider. Urine out put was an additional 430 mls post treatment. Patient was in no distress, no issues, ambulatory to lobby post treatment.

## 2023-01-29 NOTE — Patient Instructions (Signed)
[facility]  Discharge Instructions: Thank you for choosing [facility] to provide your oncology and hematology care.   If you have a lab appointment with the Cancer Center, please go directly to the Cancer Center and check in at the registration area.   Wear comfortable clothing and clothing appropriate for easy access to any Portacath or PICC line.   We strive to give you quality time with your provider. You may need to reschedule your appointment if you arrive late (15 or more minutes).  Arriving late affects you and other patients whose appointments are after yours.  Also, if you miss three or more appointments without notifying the office, you may be dismissed from the clinic at the provider's discretion.      For prescription refill requests, have your pharmacy contact our office and allow 72 hours for refills to be completed.    Today you received the following chemotherapy and/or immunotherapy agents Mutamycin      To help prevent nausea and vomiting after your treatment, we encourage you to take your nausea medication as directed.  BELOW ARE SYMPTOMS THAT SHOULD BE REPORTED IMMEDIATELY: *FEVER GREATER THAN 100.4 F (38 C) OR HIGHER *CHILLS OR SWEATING *NAUSEA AND VOMITING THAT IS NOT CONTROLLED WITH YOUR NAUSEA MEDICATION *UNUSUAL SHORTNESS OF BREATH *UNUSUAL BRUISING OR BLEEDING *URINARY PROBLEMS (pain or burning when urinating, or frequent urination) *BOWEL PROBLEMS (unusual diarrhea, constipation, pain near the anus) TENDERNESS IN MOUTH AND THROAT WITH OR WITHOUT PRESENCE OF ULCERS (sore throat, sores in mouth, or a toothache) UNUSUAL RASH, SWELLING OR PAIN  UNUSUAL VAGINAL DISCHARGE OR ITCHING   Items with * indicate a potential emergency and should be followed up as soon as possible or go to the Emergency Department if any problems should occur.  Please show the CHEMOTHERAPY ALERT CARD or IMMUNOTHERAPY ALERT CARD at  check-in to the Emergency Department and triage nurse.  Should you have questions after your visit or need to cancel or reschedule your appointment, please contact [facility]  Dept: [phone]  and follow the prompts.  Office hours are 8:00 a.m. to 4:30 p.m. Monday - Friday. Please note that voicemails left after 4:00 p.m. may not be returned until the following business day.  We are closed weekends and major holidays. You have access to a nurse at all times for urgent questions. Please call the main number to the clinic Dept: [phone] and follow the prompts.   For any non-urgent questions, you may also contact your provider using MyChart. We now offer e-Visits for anyone 18 and older to request care online for non-urgent symptoms. For details visit [email or web address].   Also download the MyChart app! Go to the app store, search "MyChart", open the app, select [facility], and log in with your MyChart username and password.   

## 2023-01-30 ENCOUNTER — Other Ambulatory Visit: Payer: Self-pay

## 2023-01-31 ENCOUNTER — Other Ambulatory Visit: Payer: Self-pay

## 2023-02-05 ENCOUNTER — Other Ambulatory Visit: Payer: Self-pay

## 2023-02-10 ENCOUNTER — Encounter: Payer: Self-pay | Admitting: Hematology

## 2023-02-10 NOTE — Progress Notes (Signed)
Pt's spouse called regarding financial concerns. Provided direct number to Lenise([employer] for [employer])at [phone] and advised to call tomorrow 02/11/23 before 2pm. She verbalized understanding.  She has my contact name and number for any additional questions.

## 2023-02-11 ENCOUNTER — Encounter: Payer: Self-pay | Admitting: Hematology

## 2023-02-11 NOTE — Progress Notes (Signed)
Pt's spouse called regarding financial concerns.  I gave her the number to [employer] to inquire about copay assistance.  I informed her there may be assistance for his Dx and the type of medication he's taking.  If so, [employer] can help them with that.  I also reminded her that pt was approved for the [employer] a couple of years ago, gave his available balance and again went over what the grant can be used for.  She mentioned they're on a payment plan with the hospital so I advised her to call customer service to inquire about consolidating all of his account numbers to make sure they're paying on his total balance not just one account number.  She verbalized understanding.

## 2023-02-17 ENCOUNTER — Other Ambulatory Visit: Payer: Self-pay

## 2023-02-18 DIAGNOSIS — Z85828 Personal history of other malignant neoplasm of skin: Secondary | ICD-10-CM | POA: Diagnosis not present

## 2023-02-18 DIAGNOSIS — Z08 Encounter for follow-up examination after completed treatment for malignant neoplasm: Secondary | ICD-10-CM | POA: Diagnosis not present

## 2023-02-25 DIAGNOSIS — C678 Malignant neoplasm of overlapping sites of bladder: Secondary | ICD-10-CM | POA: Diagnosis not present

## 2023-02-26 ENCOUNTER — Other Ambulatory Visit: Payer: Self-pay | Admitting: Hematology

## 2023-02-26 ENCOUNTER — Inpatient Hospital Stay: Payer: Medicare HMO | Attending: Hematology

## 2023-02-26 VITALS — BP 156/76 | HR 82 | Temp 98.1°F | Resp 18 | Wt 183.0 lb

## 2023-02-26 DIAGNOSIS — C670 Malignant neoplasm of trigone of bladder: Secondary | ICD-10-CM | POA: Insufficient documentation

## 2023-02-26 DIAGNOSIS — C671 Malignant neoplasm of dome of bladder: Secondary | ICD-10-CM

## 2023-02-26 MED ORDER — MITOMYCIN CHEMO FOR BLADDER INSTILLATION 40 MG
40.0000 mg | Freq: Once | INTRAVENOUS | Status: DC
  Filled 2023-02-26: qty 40

## 2023-02-26 MED ORDER — LIDOCAINE HCL URETHRAL/MUCOSAL 2 % EX GEL: 1.0000 | Freq: Once | CUTANEOUS | Status: DC

## 2023-02-26 MED ORDER — OXYBUTYNIN CHLORIDE 5 MG PO TABS: 10.0000 mg | ORAL_TABLET | Freq: Once | ORAL | Status: DC | PRN

## 2023-02-26 MED ORDER — PROCHLORPERAZINE MALEATE 10 MG PO TABS
10.0000 mg | ORAL_TABLET | Freq: Once | ORAL | Status: DC
  Filled 2023-02-26: qty 1

## 2023-02-26 MED ORDER — LIDOCAINE HCL URETHRAL/MUCOSAL 2 % EX GEL
1.0000 | Freq: Once | CUTANEOUS | Status: DC
  Filled 2023-02-26: qty 1

## 2023-02-26 NOTE — Progress Notes (Signed)
Pt reported to infusion today for Mitomycin treatment. This RN reached out to Dr. Mosetta Putt regarding changes to tx plan to help minimize leaking during dwell times. Prior to administering premedications and placing catheter for tx, Keith [name] was informed that the catheter available for tx was the incorrect type of catheter and was not suitable for chemotherapy administration by charge RN. Dr. Mosetta Putt made aware by charge RN. Another catheter was obtained, but it too was not suitable for chemo administration. Per Dr. Ann Maki tx today. Pt to be rescheduled for tx in 2 weeks (week of 03/12/2023). Urology to be consulted regarding bladder spasms and leaking during tx. This RN and Keith [name] informed Pt of the situation. Pt and family were agreeable to not receive tx and verbalized understanding. Pt and family expressed gratitude over the staff being cautious and holding tx until correct supplies can be obtained for safe chemo administration.  Pt was discharged to lobby in stable condition with no complaints.

## 2023-02-27 ENCOUNTER — Telehealth: Payer: Self-pay | Admitting: Hematology

## 2023-02-27 NOTE — Progress Notes (Signed)
Dr. Mosetta Putt spoke with pt's urologist Dr. Sebastian Ache w/[facility] regarding pt's intrathecal chemo infusions.  Dr. Berneice Heinrich had no other options for tx d/t pt's intolerance for intrathecal chemo.  Dr. Mosetta Putt requested this nurse to contact pt to see if pt and spouse can come in next week to discuss possible IV Pembrolizumab.  LVM for pt stating that Dr. Mosetta Putt would like to speak with pt in clinic to discuss future treatments.  Stated Erie Noe from Dr. Latanya Maudlin Scheduling Team will contact him to get him scheduled to see Dr. Mosetta Putt next week.  Instructed pt to contact Dr. Latanya Maudlin office if he have further questions.

## 2023-02-28 ENCOUNTER — Telehealth: Payer: Self-pay | Admitting: Hematology

## 2023-03-05 ENCOUNTER — Inpatient Hospital Stay: Payer: Medicare HMO | Admitting: Hematology

## 2023-03-07 ENCOUNTER — Encounter: Payer: Self-pay | Admitting: Hematology

## 2023-03-07 ENCOUNTER — Inpatient Hospital Stay (HOSPITAL_BASED_OUTPATIENT_CLINIC_OR_DEPARTMENT_OTHER): Payer: Medicare HMO | Admitting: Hematology

## 2023-03-07 VITALS — BP 136/73 | HR 78 | Temp 98.3°F | Resp 16 | Ht 69.0 in | Wt 185.7 lb

## 2023-03-07 DIAGNOSIS — C679 Malignant neoplasm of bladder, unspecified: Secondary | ICD-10-CM

## 2023-03-07 DIAGNOSIS — C671 Malignant neoplasm of dome of bladder: Secondary | ICD-10-CM

## 2023-03-07 DIAGNOSIS — C670 Malignant neoplasm of trigone of bladder: Secondary | ICD-10-CM | POA: Diagnosis not present

## 2023-03-07 NOTE — Progress Notes (Unsigned)
[facility]   Telephone:(336) [phone] Fax:(336) [phone]   Clinic Follow up Note   Patient Care Team: Charlane Ferretti, DO as PCP - General (Internal Medicine) Malachy Mood, MD as Attending Physician (Hematology and Oncology) Marcine Matar, MD as Consulting Physician (Urology)  Date of Service:  03/07/2023  CHIEF COMPLAINT: f/u of bladder cancer    CURRENT THERAPY:  Pending immunotherapy Keytruda every 3 weeks    ASSESSMENT:  Keith [name] is a 82 y.o. male with   Bladder cancer (HCC) -initially diagnosed in 2017  -He was found to have high-grade urothelial carcinoma without muscle invasion and developed and subsequently BCG refractory disease in December 2021. He received Intravesicular gemcitabine with a total of 2000 mg weekly X6 started in February 2022  -bladder biopsies, bladder washings on 06/24/2022 showed focal urothelial carcinoma in situ (CIS) and cytology was positive for high grade urothelial carcinoma -I recommended intravesical gemcitabine 2000 mg weekly x 6, he started on 08/28/16.  He tolerated first cycle poorly with significant hematuria, and intermittent fever for week after treatment. -Due to his poor tolerance to intravesical gemcitabine, I changed therapy to intravesical mitomycin 40 mg every week for 5 weeks. He tolerated the treatment very well  -His surveillance cystoscopy and biopsy were negative for residual cancer -Given his good response to intravesical mitomycin, and his high risk of recurrence, Dr. Retta Diones recommend small intravesical therapy.  Patient agrees to proceed, started in in April 2024. Unfortunately he has had recurrent leakage from the intravesical therapy, and not able to continue. -I discussed the date of immunotherapy Keytruda every 3 weeks for 2 years, which will reduce his risk of recurrence by about 40%.  Potential side effect, especially immune related pneumonitis, colitis, hepatitis, thyroid disorder, and other  endocrine disorders, skin rash, arthralgia, etc. and the small possibility of fatal event, were discussed with him in detail, he agrees to proceed.  We also discussed the success (disease-free in 5 years) rate is about 40% -He will continue cystoscopy monitoring every 3 to 4 months with his urologist Dr. Berneice Heinrich -Plan to start in 1 to 2 weeks     PLAN: - Discuss changing treatment to [facility], he agrees - start new treatment in 2 weeks with chemo class before first treatment     SUMMARY OF ONCOLOGIC HISTORY: Oncology History Overview Note   Cancer Staging  Bladder cancer [facility]) Staging form: Urinary Bladder, AJCC 8th Edition - Clinical: Stage I (cT1, cN0, cM0) - Signed by Benjiman Core, MD on 09/12/2020 WHO/ISUP grade (low/high): High Grade Histologic grading system: 2 grade system - Pathologic stage from 06/24/2022: Stage 0is (pTis, pN0, cM0) - Signed by Malachy Mood, MD on 08/24/2022 Stage prefix: Initial diagnosis WHO/ISUP grade (low/high): High Grade Histologic grading system: 2 grade system     Bladder cancer (HCC)  09/12/2020 Initial Diagnosis   Bladder cancer (HCC)   09/12/2020 Cancer Staging   Staging form: Urinary Bladder, AJCC 8th Edition - Clinical: Stage I (cT1, cN0, cM0) - Signed by Benjiman Core, MD on 09/12/2020 WHO/ISUP grade (low/high): High Grade Histologic grading system: 2 grade system   06/24/2022 Pathology Results             FINAL MICROSCOPIC DIAGNOSIS:  A. BLADDER, BIOPSY: Focal urothelial carcinoma in situ (CIS)     06/24/2022 Cancer Staging   Staging form: Urinary Bladder, AJCC 8th Edition - Pathologic stage from 06/24/2022: Stage 0is (pTis, pN0, cM0) - Signed by Malachy Mood, MD on 08/24/2022 Stage prefix:  Initial diagnosis WHO/ISUP grade (low/high): High Grade Histologic grading system: 2 grade system   08/28/2022 - 08/28/2022 Chemotherapy   Patient is on Treatment Plan : BLADDER Gemcitabine INTRAVESICAL (2000) q7d     09/12/2022 - 02/26/2023  Chemotherapy   Patient is on Treatment Plan : BLADDER Mitomycin INTRAVESICAL (40 mg) q7d     10/25/2022 Genetic Testing   Negative [facility] CancerNext-Expanded +RNA Panel.  Report date is 10/25/2022.   The CancerNext-Expanded gene panel offered by [facility] and includes sequencing, rearrangement, and RNA analysis for the following 77 genes: AIP, ALK, APC, ATM, AXIN2, BAP1, BARD1, BLM, BMPR1A, BRCA1, BRCA2, BRIP1, CDC73, CDH1, CDK4, CDKN1B, CDKN2A, CHEK2, CTNNA1, DICER1, FANCC, FH, FLCN, GALNT12, KIF1B, LZTR1, MAX, MEN1, MET, MLH1, MSH2, MSH3, MSH6, MUTYH, NBN, NF1, NF2, NTHL1, PALB2, PHOX2B, PMS2, POT1, PRKAR1A, PTCH1, PTEN, RAD51C, RAD51D, RB1, RECQL, RET, SDHA, SDHAF2, SDHB, SDHC, SDHD, SMAD4, SMARCA4, SMARCB1, SMARCE1, STK11, SUFU, TMEM127, TP53, TSC1, TSC2, VHL and XRCC2 (sequencing and deletion/duplication); EGFR, EGLN1, HOXB13, KIT, MITF, PDGFRA, POLD1, and POLE (sequencing only); EPCAM and GREM1 (deletion/duplication only).    03/19/2023 -  Chemotherapy   Patient is on Treatment Plan : BLADDER Pembrolizumab (200) q21d     Malignant neoplasm of trigone of urinary bladder (HCC)  09/12/2020 Initial Diagnosis   Malignant neoplasm of trigone of urinary bladder (HCC)   09/26/2020 - 10/31/2020 Chemotherapy   Patient is on Treatment Plan : BLADDER Gemcitabine q7d        INTERVAL HISTORY:  Keith [name] is here for a follow up of bladder cancer . He was last seen by me on 01/29/2023. He presents to the clinic with his wife. He is not able to continue with current treatment due to not being able to hold chemo in the bladder.    All other systems were reviewed with the patient and are negative.  MEDICAL HISTORY:  Past Medical History:  Diagnosis Date   Bladder cancer (HCC)    Blind right eye    BPH (benign prostatic hypertrophy)    Cataracts, both eyes    Depression    DM type 2 (diabetes mellitus, type 2) (HCC)    DOE (dyspnea on exertion)    with heavy exertion   Full dentures     GERD (gastroesophageal reflux disease)    Glaucoma no peripheral or side vision   right eye blind   Glaucoma, right eye    HOH (hard of hearing)    Hyperlipidemia    Hypertension    Hypothyroidism    Local recurrence of cancer of urinary bladder (HCC) urologsit-  dr Retta Diones   1st dx 01-29-2016--  now recurrent 05-31-2016   Lower extremity edema 07/11/2020   right swells more than left goes down at night after sleeping   Lower urinary tract symptoms (LUTS) 2019   Migraine    Peripheral neuropathy    middle finger left hand   Pneumonia 2019   Skin abnormality    base of spine looks like pimple has had since 07-01-2020, no drainage area intact   Wears glasses     SURGICAL HISTORY: Past Surgical History:  Procedure Laterality Date   ANTERIOR CERVICAL DECOMP/DISCECTOMY FUSION  08-15-2000   C5 -- C6   ANTERIOR CERVICAL DECOMP/DISCECTOMY FUSION  03-04-2008   C6 -- C7   areas removed from back at spine  2021 jan and aug 2021   CYSTOSCOPY W/ RETROGRADES Left 05/11/2020   Procedure: Cystoscopy with left retrograde ureteropyelogram, resection of probable bladder tumor and left  trigonal/ureteral orifice region, 3 cm in size, fluoroscopic interpretation, left ureteroscopy, placement of 6 French by 26 cm contour double-J stent without tether;  Surgeon: Marcine Matar, MD;  Location: [facility];  Service: Urology;  Laterality: Left;   CYSTOSCOPY W/ RETROGRADES Bilateral 06/24/2022   Procedure: CYSTOSCOPY WITH RETROGRADE PYELOGRAM;  Surgeon: Marcine Matar, MD;  Location: [facility];  Service: Urology;  Laterality: Bilateral;  1 HR   CYSTOSCOPY WITH BIOPSY N/A 01/29/2016   Procedure: CYSTOSCOPY WITH BIOPSY;  Surgeon: Marcine Matar, MD;  Location: [facility];  Service: Urology;  Laterality: N/A;   CYSTOSCOPY WITH BIOPSY N/A 05/31/2016   Procedure: CYSTOSCOPY WITH BIOPSY;  Surgeon: Marcine Matar, MD;  Location: [facility];  Service:  Urology;  Laterality: N/A;   CYSTOSCOPY WITH BIOPSY N/A 06/24/2022   Procedure: CYSTOSCOPY WITH BIOPSY;  Surgeon: Marcine Matar, MD;  Location: [facility];  Service: Urology;  Laterality: N/A;   EYE 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  ALLERGIES:  has No Known Allergies.  MEDICATIONS:  Current Outpatient Medications  Medication Sig Dispense Refill   acetaminophen (TYLENOL) 500 MG tablet Take 1,000 mg by mouth every 8 (eight) hours as needed for moderate pain.     amLODipine (NORVASC) 10 MG tablet Take 10 mg by mouth daily.     Aspirin-Salicylamide-Caffeine (ARTHRITIS STRENGTH BC POWDER PO) Take 1 packet by mouth daily as needed (pain). (Patient not taking: Reported on 08/28/2022)     atorvastatin (LIPITOR) 20 MG tablet Take 20 mg by mouth at bedtime.      carteolol (OCUPRESS) 1 % ophthalmic solution Place 1 drop into the right eye every morning.     cetirizine (ZYRTEC) 10 MG tablet Take 10 mg by mouth daily as needed for allergies.     clotrimazole-betamethasone (LOTRISONE) lotion Apply 1 Application topically daily as needed (irritation).     fluticasone (FLONASE) 50 MCG/ACT nasal spray Place 2 sprays into both nostrils daily as needed for allergies.      glimepiride (AMARYL) 1 MG tablet Take 1 mg by mouth daily with breakfast.     ibuprofen (ADVIL) 200 MG tablet Take 600 mg by mouth every 8 (eight) hours as needed for moderate pain. (Patient not  taking: Reported on 08/28/2022)     latanoprost (XALATAN) 0.005 % ophthalmic solution Place 1 drop into the right eye at bedtime.     levothyroxine (SYNTHROID, LEVOTHROID) 100 MCG tablet Take 100 mcg by mouth daily before breakfast.      losartan (COZAAR) 100 MG tablet Take 100 mg by mouth at bedtime.      meloxicam (MOBIC) 15 MG tablet Take 15 mg by mouth daily.     Misc Natural Products (COMPLETE PROSTATE HEALTH PO) Take 1 tablet by mouth 2 (two) times daily.     omeprazole (PRILOSEC) 20 MG capsule Take 20 mg by mouth daily.  oxybutynin (DITROPAN-XL) 5 MG 24 hr tablet TAKE 1 TABLET (5 MG TOTAL) BY MOUTH ONCE A WEEK. TAKE 2 HOURS BEFORE SCHEDULED CHEMO IN OUR OFFICE 90 tablet 1   sitaGLIPtin (JANUVIA) 100 MG tablet Take 100 mg by mouth every morning.     tamsulosin (FLOMAX) 0.4 MG CAPS capsule Take 0.4 mg by mouth every morning.      vitamin B-12 (CYANOCOBALAMIN) 500 MCG tablet Take 500 mcg by mouth daily.     No current facility-administered medications for this visit.    PHYSICAL EXAMINATION: ECOG PERFORMANCE STATUS: 0 - Asymptomatic  Vitals:   03/07/23 1503  BP: 136/73  Pulse: 78  Resp: 16  Temp: 98.3 F (36.8 C)  SpO2: 99%   Wt Readings from Last 3 Encounters:  03/07/23 185 lb 11.2 oz (84.2 kg)  02/26/23 183 lb (83 kg)  01/29/23 181 lb 12.8 oz (82.5 kg)     GENERAL:alert, no distress and comfortable SKIN: skin color, texture, turgor are normal, no rashes or significant lesions EYES: normal, Conjunctiva are pink and non-injected, sclera clear NECK: supple, thyroid normal size, non-tender, without nodularity LYMPH:  no palpable lymphadenopathy in the cervical, axillary  LUNGS: clear to auscultation and percussion with normal breathing effort HEART: regular rate & rhythm and no murmurs and no lower extremity edema ABDOMEN:abdomen soft, non-tender and normal bowel sounds Musculoskeletal:no cyanosis of digits and no clubbing  NEURO: alert & oriented x 3 with fluent speech,  no focal motor/sensory deficits  LABORATORY DATA:  I have reviewed the data as listed    Latest Ref Rng & Units 01/29/2023    8:33 AM 12/06/2022    8:43 AM 10/09/2022   11:35 AM  CBC  WBC 4.0 - 10.5 K/uL 6.4  5.3  5.1   Hemoglobin 13.0 - 17.0 g/dL 10.2  72.5  36.6   Hematocrit 39.0 - 52.0 % 39.1  38.4  36.3   Platelets 150 - 400 K/uL 165  186  171         Latest Ref Rng & Units 01/29/2023    8:33 AM 12/06/2022    8:43 AM 10/09/2022   11:35 AM  CMP  Glucose 70 - 99 mg/dL 440  347  425   BUN 8 - 23 mg/dL 14  12  13    Creatinine 0.61 - 1.24 mg/dL 9.56  3.87  5.64   Sodium 135 - 145 mmol/L 139  141  139   Potassium 3.5 - 5.1 mmol/L 3.7  4.1  3.8   Chloride 98 - 111 mmol/L 103  106  107   CO2 22 - 32 mmol/L 30  29  27    Calcium 8.9 - 10.3 mg/dL 9.3  9.6  9.0   Total Protein 6.5 - 8.1 g/dL 6.8  7.1  6.8   Total Bilirubin 0.3 - 1.2 mg/dL 0.5  0.3  0.4   Alkaline Phos 38 - 126 U/L 50  47  52   AST 15 - 41 U/L 16  17  17    ALT 0 - 44 U/L 17  16  19        RADIOGRAPHIC STUDIES: I have personally reviewed the radiological images as listed and agreed with the findings in the report. No results found.    Orders Placed This Encounter  Procedures   CBC with Differential (Cancer Center Only)    Standing Status:   Future    Standing Expiration Date:   03/18/2024   CMP (Cancer Center only)  Standing Status:   Future    Standing Expiration Date:   03/18/2024   T4    Standing Status:   Future    Standing Expiration Date:   03/18/2024   TSH    Standing Status:   Future    Standing Expiration Date:   03/18/2024   CBC with Differential (Cancer Center Only)    Standing Status:   Future    Standing Expiration Date:   04/08/2024   CMP (Cancer Center only)    Standing Status:   Future    Standing Expiration Date:   04/08/2024   CBC with Differential (Cancer Center Only)    Standing Status:   Future    Standing Expiration Date:   04/29/2024   CMP (Cancer Center only)    Standing Status:    Future    Standing Expiration Date:   04/29/2024   T4    Standing Status:   Future    Standing Expiration Date:   04/29/2024   TSH    Standing Status:   Future    Standing Expiration Date:   04/29/2024   All questions were answered. The patient knows to call the clinic with any problems, questions or concerns. No barriers to learning was detected. The total time spent in the appointment was 40 minutes.     Malachy Mood, MD 03/07/2023  I, Sharlette Dense, CMA, am acting as scribe for Malachy Mood, MD.   I have reviewed the above documentation for accuracy and completeness, and I agree with the above.

## 2023-03-07 NOTE — Assessment & Plan Note (Signed)
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 He received Intravesicular gemcitabine with a total of 2000 mg weekly X6 started in February 2022  -bladder biopsies, bladder washings on 06/24/2022 showed focal urothelial carcinoma in situ (CIS) and cytology was positive for high grade urothelial carcinoma -I recommended intravesical gemcitabine 2000 mg weekly x 6, he started on 08/28/16.  He tolerated first cycle poorly with significant hematuria, and intermittent fever for week after treatment. -Due to his poor tolerance to intravesical gemcitabine, I changed therapy to intravesical mitomycin 40 mg every week for 5 weeks. He tolerated the treatment very well  -His surveillance cystoscopy and biopsy were negative for residual cancer -Given his good response to intravesical mitomycin, and his high risk of recurrence, Dr. Retta Diones recommend small intravesical therapy.  Patient agrees to proceed, started in in April 2024. Unfortunately he has had recurrent leakage from the intravesical therapy, and not able to continue. -I discussed the date of immunotherapy Keytruda every 3 weeks for 2 years, which will reduce his risk of recurrence by about 40%.  Potential side effect, especially immune related pneumonitis, colitis, hepatitis, thyroid disorder, and other endocrine disorders, skin rash, arthralgia, etc. and the small possibility of fatal event, were discussed with him in detail, he agrees to proceed.  We also discussed the success (disease-free in 5 years) rate is about 40% -He will continue cystoscopy monitoring every 3 to 4 months with his urologist Dr. Berneice Heinrich -Plan to start in 1 to 2 weeks

## 2023-03-07 NOTE — Progress Notes (Signed)
DISCONTINUE ON PATHWAY REGIMEN - Bladder  No Medical Intervention - Off Treatment.  REASON: Other Reason PRIOR TREATMENT: [id number]: Other Intravesical Therapy in Consultation with Urology  START ON PATHWAY REGIMEN - Bladder     A cycle is every 21 days:     Pembrolizumab   **Always confirm dose/schedule in your pharmacy ordering system**  Patient Characteristics: Pre-Cystectomy or Nonsurgical Candidate, M0 (Clinical Staging), High-Grade cTa, cN0 or cTis/cT1, cN0, Refractory to Intravesical BCG and Referring Urologist Indicates Other Intravesical Therapies Not Preferred Therapeutic Status: Pre-Cystectomy or Nonsurgical Candidate, M0 (Clinical Staging) AJCC M Category: cM0 AJCC 8 Stage Grouping: 0is AJCC T Category: cTis AJCC N Category: cN0 Intent of Therapy: Curative Intent, Discussed with Patient

## 2023-03-08 ENCOUNTER — Encounter: Payer: Self-pay | Admitting: Hematology

## 2023-03-09 ENCOUNTER — Other Ambulatory Visit: Payer: Self-pay

## 2023-03-10 ENCOUNTER — Inpatient Hospital Stay: Payer: Medicare HMO

## 2023-03-12 ENCOUNTER — Inpatient Hospital Stay: Payer: Medicare HMO

## 2023-03-16 ENCOUNTER — Other Ambulatory Visit: Payer: Self-pay

## 2023-03-18 ENCOUNTER — Inpatient Hospital Stay: Payer: Medicare HMO | Attending: Hematology

## 2023-03-18 ENCOUNTER — Encounter: Payer: Self-pay | Admitting: Nurse Practitioner

## 2023-03-18 ENCOUNTER — Inpatient Hospital Stay: Payer: Medicare HMO | Admitting: Nurse Practitioner

## 2023-03-18 ENCOUNTER — Other Ambulatory Visit: Payer: Self-pay

## 2023-03-18 ENCOUNTER — Inpatient Hospital Stay: Payer: Medicare HMO

## 2023-03-18 VITALS — BP 117/52 | HR 74 | Temp 98.2°F | Resp 17 | Wt 184.1 lb

## 2023-03-18 DIAGNOSIS — C670 Malignant neoplasm of trigone of bladder: Secondary | ICD-10-CM

## 2023-03-18 DIAGNOSIS — E039 Hypothyroidism, unspecified: Secondary | ICD-10-CM | POA: Diagnosis not present

## 2023-03-18 DIAGNOSIS — Z79620 Long term (current) use of immunosuppressive biologic: Secondary | ICD-10-CM | POA: Diagnosis not present

## 2023-03-18 DIAGNOSIS — Z79899 Other long term (current) drug therapy: Secondary | ICD-10-CM | POA: Diagnosis not present

## 2023-03-18 DIAGNOSIS — Z5112 Encounter for antineoplastic immunotherapy: Secondary | ICD-10-CM | POA: Diagnosis not present

## 2023-03-18 DIAGNOSIS — C671 Malignant neoplasm of dome of bladder: Secondary | ICD-10-CM

## 2023-03-18 DIAGNOSIS — Z2989 Encounter for other specified prophylactic measures: Secondary | ICD-10-CM

## 2023-03-18 LAB — CBC WITH DIFFERENTIAL (CANCER CENTER ONLY)
Abs Immature Granulocytes: 0.03 10*3/uL (ref 0.00–0.07)
Basophils Absolute: 0.1 10*3/uL (ref 0.0–0.1)
Basophils Relative: 1 %
Eosinophils Absolute: 0.3 10*3/uL (ref 0.0–0.5)
Eosinophils Relative: 4 %
HCT: 39.4 % (ref 39.0–52.0)
Hemoglobin: 13.5 g/dL (ref 13.0–17.0)
Immature Granulocytes: 0 %
Lymphocytes Relative: 22 %
Lymphs Abs: 1.5 10*3/uL (ref 0.7–4.0)
MCH: 30.8 pg (ref 26.0–34.0)
MCHC: 34.3 g/dL (ref 30.0–36.0)
MCV: 89.7 fL (ref 80.0–100.0)
Monocytes Absolute: 0.6 10*3/uL (ref 0.1–1.0)
Monocytes Relative: 9 %
Neutro Abs: 4.3 10*3/uL (ref 1.7–7.7)
Neutrophils Relative %: 64 %
Platelet Count: 183 10*3/uL (ref 150–400)
RBC: 4.39 MIL/uL (ref 4.22–5.81)
RDW: 13.1 % (ref 11.5–15.5)
WBC Count: 6.8 10*3/uL (ref 4.0–10.5)
nRBC: 0 % (ref 0.0–0.2)

## 2023-03-18 LAB — CMP (CANCER CENTER ONLY)
ALT: 19 U/L (ref 0–44)
AST: 17 U/L (ref 15–41)
Albumin: 4.4 g/dL (ref 3.5–5.0)
Alkaline Phosphatase: 51 U/L (ref 38–126)
Anion gap: 5 (ref 5–15)
BUN: 16 mg/dL (ref 8–23)
CO2: 28 mmol/L (ref 22–32)
Calcium: 9.4 mg/dL (ref 8.9–10.3)
Chloride: 106 mmol/L (ref 98–111)
Creatinine: 0.92 mg/dL (ref 0.61–1.24)
GFR, Estimated: >60 mL/min (ref >60)
Glucose, Bld: 122 mg/dL — ABNORMAL HIGH (ref 70–99)
Potassium: 3.9 mmol/L (ref 3.5–5.1)
Sodium: 139 mmol/L (ref 135–145)
Total Bilirubin: 0.4 mg/dL (ref 0.3–1.2)
Total Protein: 7.3 g/dL (ref 6.5–8.1)

## 2023-03-18 LAB — TSH: TSH: 1.307 u[IU]/mL (ref 0.350–4.500)

## 2023-03-18 MED ORDER — SODIUM CHLORIDE 0.9 % IV SOLN
200.0000 mg | Freq: Once | INTRAVENOUS | Status: AC
  Administered 2023-03-18: 200 mg via INTRAVENOUS
  Filled 2023-03-18: qty 200

## 2023-03-18 MED ORDER — SODIUM CHLORIDE 0.9 % IV SOLN: Freq: Once | INTRAVENOUS | Status: AC

## 2023-03-18 NOTE — Assessment & Plan Note (Signed)
Patient currently on levothyroxine 100 mcg daily  -check TSH and T4 today  -monitor closely, especially while receiving immunotherapy

## 2023-03-18 NOTE — Progress Notes (Addendum)
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 Will be every 3 weeks for next 2 years.  -chemotherapy education completed 03/10/2023  -labs 03/18/2023 - WBC 6.8; Hgb 13.5; Hct 39.4; Plt 183; ANC 4.3; K 3.9; BUN 16; creatinine 0.92; Ca 9.4; AST 17; ALT 19; AlkPhos 51; t.Bili 0.4. awaiting results of TSH and T4  -order for port placement in approximately 2 weeks  -labs/flush, follow up, and infusion 3 weeks.    Acquired hypothyroidism Patient currently on levothyroxine 100 mcg daily  -check TSH and T4 today  -monitor closely, especially while receiving immunotherapy    The patient understands the plans discussed today and is in agreement with them.  He knows to contact our office if he develops concerns prior to his next appointment.  I provided 25 minutes of face-to-face time during this encounter and > 50% was spent counseling as documented under my assessment and plan.    Carlean Jews, NP  [facility] [address] [place] [address] Dept: [phone] Dept Fax: [phone]   Orders Placed This Encounter  Procedures   IR IMAGING GUIDED PORT INSERTION    Standing Status:   Future    Standing Expiration Date:   03/17/2024    Order Specific Question:   Reason for Exam (SYMPTOM  OR DIAGNOSIS REQUIRED)    Answer:   Rande Lawman infusion every 3 weeks. patient has fragile and small veins    Order Specific Question:   Preferred Imaging Location?    Answer:   [facility]      CHIEF COMPLAINT:  CC: cancer of urinary bladder   Current  Treatment:  keytruda every 21 days   INTERVAL HISTORY:  Keith [name] is here today for repeat clinical assessment. He denies fevers or chills. He denies pain. His appetite is good. His weight has been stable  Cancer of urinary bladder 03/18/2023 - presents for initial treatment with Keytruda. Will be every 3 weeks for next 2 years.  -chemotherapy education completed 03/10/2023  -labs 03/18/2023 - WBC 6.8; Hgb 13.5; Hct 39.4; Plt 183; ANC 4.3; K 3.9; BUN 16; creatinine 0.92; Ca 9.4; AST 17; ALT 19; AlkPhos 51; t.Bili 0.4. awaiting results of TSH and T4  -order for port placement in approximately 2 weeks  -labs/flush, follow up, and infusion  Plan :  Reviewed labs  Keytruda infusion today  Order for port placement in 2 weeks  Labs/flush, follow up, infusion in 3 weeks.   I have reviewed the past medical history, past surgical history, social history and family history with the patient and they are unchanged from previous note.  ALLERGIES:  has No Known Allergies.  MEDICATIONS:  Current Outpatient Medications  Medication Sig Dispense Refill   acetaminophen (TYLENOL) 500 MG tablet Take 1,000 mg by mouth every 8 (eight) hours as needed for moderate pain.     amLODipine (NORVASC) 10 MG tablet Take 10 mg by mouth daily.     Aspirin-Salicylamide-Caffeine (ARTHRITIS STRENGTH BC POWDER PO) Take 1 packet by mouth daily as needed (pain).     atorvastatin (LIPITOR) 20 MG tablet Take 20 mg by mouth  at bedtime.      carteolol (OCUPRESS) 1 % ophthalmic solution Place 1 drop into the right eye every morning.     cetirizine (ZYRTEC) 10 MG tablet Take 10 mg by mouth daily as needed for allergies.     clotrimazole-betamethasone (LOTRISONE) lotion Apply 1 Application topically daily as needed (irritation).     fluticasone (FLONASE) 50 MCG/ACT nasal spray Place 2 sprays into both nostrils daily as needed for allergies.      glimepiride (AMARYL) 1 MG tablet Take 1 mg by mouth daily with breakfast.     ibuprofen  (ADVIL) 200 MG tablet Take 600 mg by mouth every 8 (eight) hours as needed for moderate pain.     latanoprost (XALATAN) 0.005 % ophthalmic solution Place 1 drop into the right eye at bedtime.     levothyroxine (SYNTHROID, LEVOTHROID) 100 MCG tablet Take 100 mcg by mouth daily before breakfast.      losartan (COZAAR) 100 MG tablet Take 100 mg by mouth at bedtime.      meloxicam (MOBIC) 15 MG tablet Take 15 mg by mouth daily.     Misc Natural Products (COMPLETE PROSTATE HEALTH PO) Take 1 tablet by mouth 2 (two) times daily.     omeprazole (PRILOSEC) 20 MG capsule Take 20 mg by mouth daily.     oxybutynin (DITROPAN-XL) 5 MG 24 hr tablet TAKE 1 TABLET (5 MG TOTAL) BY MOUTH ONCE A WEEK. TAKE 2 HOURS BEFORE SCHEDULED CHEMO IN OUR OFFICE 90 tablet 1   sitaGLIPtin (JANUVIA) 100 MG tablet Take 100 mg by mouth every morning.     tamsulosin (FLOMAX) 0.4 MG CAPS capsule Take 0.4 mg by mouth every morning.      vitamin B-12 (CYANOCOBALAMIN) 500 MCG tablet Take 500 mcg by mouth daily.     No current facility-administered medications for this visit.   Facility-Administered Medications Ordered in Other Visits  Medication Dose Route Frequency Provider Last Rate Last Admin   pembrolizumab (KEYTRUDA) 200 mg in sodium chloride 0.9 % 50 mL chemo infusion  200 mg Intravenous Once Malachy Mood, MD 116 mL/hr at 03/18/23 1514 200 mg at 03/18/23 1514    HISTORY OF PRESENT ILLNESS:   Oncology History Overview Note   Cancer Staging  Bladder cancer [facility]) Staging form: Urinary Bladder, AJCC 8th Edition - Clinical: Stage I (cT1, cN0, cM0) - Signed by Benjiman Core, MD on 09/12/2020 WHO/ISUP grade (low/high): High Grade Histologic grading system: 2 grade system - Pathologic stage from 06/24/2022: Stage 0is (pTis, pN0, cM0) - Signed by Malachy Mood, MD on 08/24/2022 Stage prefix: Initial diagnosis WHO/ISUP grade (low/high): High Grade Histologic grading system: 2 grade system     Bladder cancer (HCC)  09/12/2020 Initial  Diagnosis   Bladder cancer (HCC)   09/12/2020 Cancer Staging   Staging form: Urinary Bladder, AJCC 8th Edition - Clinical: Stage I (cT1, cN0, cM0) - Signed by Benjiman Core, MD on 09/12/2020 WHO/ISUP grade (low/high): High Grade Histologic grading system: 2 grade system   06/24/2022 Pathology Results             FINAL MICROSCOPIC DIAGNOSIS:  A. BLADDER, BIOPSY: Focal urothelial carcinoma in situ (CIS)     06/24/2022 Cancer Staging   Staging form: Urinary Bladder, AJCC 8th Edition - Pathologic stage from 06/24/2022: Stage 0is (pTis, pN0, cM0) - Signed by Malachy Mood, MD on 08/24/2022 Stage prefix: Initial diagnosis WHO/ISUP grade (low/high): High Grade Histologic grading system: 2 grade system   08/28/2022 - 08/28/2022 Chemotherapy  Patient is on Treatment Plan : BLADDER Gemcitabine INTRAVESICAL (2000) q7d     09/12/2022 - 02/26/2023 Chemotherapy   Patient is on Treatment Plan : BLADDER Mitomycin INTRAVESICAL (40 mg) q7d     10/25/2022 Genetic Testing   Negative [facility] CancerNext-Expanded +RNA Panel.  Report date is 10/25/2022.   The CancerNext-Expanded gene panel offered by [facility] and includes sequencing, rearrangement, and RNA analysis for the following 77 genes: AIP, ALK, APC, ATM, AXIN2, BAP1, BARD1, BLM, BMPR1A, BRCA1, BRCA2, BRIP1, CDC73, CDH1, CDK4, CDKN1B, CDKN2A, CHEK2, CTNNA1, DICER1, FANCC, FH, FLCN, GALNT12, KIF1B, LZTR1, MAX, MEN1, MET, MLH1, MSH2, MSH3, MSH6, MUTYH, NBN, NF1, NF2, NTHL1, PALB2, PHOX2B, PMS2, POT1, PRKAR1A, PTCH1, PTEN, RAD51C, RAD51D, RB1, RECQL, RET, SDHA, SDHAF2, SDHB, SDHC, SDHD, SMAD4, SMARCA4, SMARCB1, SMARCE1, STK11, SUFU, TMEM127, TP53, TSC1, TSC2, VHL and XRCC2 (sequencing and deletion/duplication); EGFR, EGLN1, HOXB13, KIT, MITF, PDGFRA, POLD1, and POLE (sequencing only); EPCAM and GREM1 (deletion/duplication only).    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)   09/26/2020 - 10/31/2020 Chemotherapy   Patient is on Treatment Plan : BLADDER Gemcitabine q7d         REVIEW OF SYSTEMS:   Constitutional: Denies fevers, chills or abnormal weight loss Eyes: Denies blurriness of vision Ears, nose, mouth, throat, and face: Denies mucositis or sore throat Respiratory: Denies cough, dyspnea or wheezes Cardiovascular: Denies palpitation, chest discomfort or lower extremity swelling Gastrointestinal:  Denies nausea, heartburn or change in bowel habits Skin: Denies abnormal skin rashes Lymphatics: Denies new lymphadenopathy or easy bruising Neurological:Denies numbness, tingling or new weaknesses Behavioral/Psych: Mood is stable, no new changes  Urinary: urinary frequency, bladder spasms.  All other systems were reviewed with the patient and are negative.   VITALS:   Today's Vitals   03/18/23 1307 03/18/23 1308  BP: (!) 117/52   Pulse: 74   Resp: 17   Temp: 98.2 F (36.8 C)   TempSrc: Oral   SpO2: 98%   Weight: 184 lb 1.6 oz (83.5 kg)   PainSc:  0-No pain   Body mass index is 27.19 kg/m.   Wt Readings from Last 3 Encounters:  03/18/23 184 lb 1.6 oz (83.5 kg)  03/07/23 185 lb 11.2 oz (84.2 kg)  02/26/23 183 lb (83 kg)    Body mass index is 27.19 kg/m.  Performance status (ECOG): 0 - Asymptomatic  PHYSICAL EXAM:   GENERAL:alert, no distress and comfortable SKIN: skin color, texture, turgor are normal, no rashes or significant lesions EYES: normal, Conjunctiva are pink and non-injected, sclera clear OROPHARYNX:no exudate, no erythema and lips, buccal mucosa, and tongue normal  NECK: supple, thyroid normal size, non-tender, without nodularity LYMPH:  no palpable lymphadenopathy in the cervical, axillary or inguinal LUNGS: clear to auscultation and percussion with normal breathing effort HEART: regular rate & rhythm  and no murmurs and no lower extremity edema ABDOMEN:abdomen soft, non-tender and normal bowel sounds Musculoskeletal:no cyanosis of digits and no clubbing  NEURO: alert & oriented x 3 with fluent speech, no focal motor/sensory deficits  LABORATORY DATA:  I have reviewed the data as listed    Component Value Date/Time   NA 139 03/18/2023 1240   K 3.9 03/18/2023 1240   CL 106 03/18/2023 1240   CO2 28 03/18/2023 1240   GLUCOSE 122 (H) 03/18/2023 1240  BUN 16 03/18/2023 1240   CREATININE 0.92 03/18/2023 1240   CALCIUM 9.4 03/18/2023 1240   PROT 7.3 03/18/2023 1240   ALBUMIN 4.4 03/18/2023 1240   AST 17 03/18/2023 1240   ALT 19 03/18/2023 1240   ALKPHOS 51 03/18/2023 1240   BILITOT 0.4 03/18/2023 1240   GFRNONAA >60 03/18/2023 1240   GFRAA >60 05/11/2020 1300    Lab Results  Component Value Date   WBC 6.8 03/18/2023   NEUTROABS 4.3 03/18/2023   HGB 13.5 03/18/2023   HCT 39.4 03/18/2023   MCV 89.7 03/18/2023   PLT 183 03/18/2023    Plan :  Reviewed labs  Keytruda infusion today  Order for port placement in 2 weeks  Labs/flush, follow up, infusion in 3 weeks.   ADDENDUM I have seen the patient, examined him. I agree with the assessment and and plan and have edited the notes.   Montae is here today for first cycle Keytruda.  Potential benefits and side effects are discussed with him and his wife again, he agrees to proceed.  He knows what to watch at home and to call me if he has concerns.  Will see him back in 3 weeks before cycle 2.  Malachy Mood md 03/18/2023

## 2023-03-18 NOTE — Assessment & Plan Note (Addendum)
03/18/2023 - presents for initial treatment with Keytruda. Will be every 3 weeks for next 2 years.  -chemotherapy education completed 03/10/2023  -labs 03/18/2023 - WBC 6.8; Hgb 13.5; Hct 39.4; Plt 183; ANC 4.3; K 3.9; BUN 16; creatinine 0.92; Ca 9.4; AST 17; ALT 19; AlkPhos 51; t.Bili 0.4. awaiting results of TSH and T4  -order for port placement in approximately 2 weeks  -labs/flush, follow up, and infusion 3 weeks.

## 2023-03-18 NOTE — Patient Instructions (Signed)
[facility]  Discharge Instructions: Thank you for choosing [facility] to provide your oncology and hematology care.   If you have a lab appointment with the Cancer Center, please go directly to the Cancer Center and check in at the registration area.   Wear comfortable clothing and clothing appropriate for easy access to any Portacath or PICC line.   We strive to give you quality time with your provider. You may need to reschedule your appointment if you arrive late (15 or more minutes).  Arriving late affects you and other patients whose appointments are after yours.  Also, if you miss three or more appointments without notifying the office, you may be dismissed from the clinic at the provider's discretion.      For prescription refill requests, have your pharmacy contact our office and allow 72 hours for refills to be completed.    Today you received the following chemotherapy and/or immunotherapy agents keytruda      To help prevent nausea and vomiting after your treatment, we encourage you to take your nausea medication as directed.  BELOW ARE SYMPTOMS THAT SHOULD BE REPORTED IMMEDIATELY: *FEVER GREATER THAN 100.4 F (38 C) OR HIGHER *CHILLS OR SWEATING *NAUSEA AND VOMITING THAT IS NOT CONTROLLED WITH YOUR NAUSEA MEDICATION *UNUSUAL SHORTNESS OF BREATH *UNUSUAL BRUISING OR BLEEDING *URINARY PROBLEMS (pain or burning when urinating, or frequent urination) *BOWEL PROBLEMS (unusual diarrhea, constipation, pain near the anus) TENDERNESS IN MOUTH AND THROAT WITH OR WITHOUT PRESENCE OF ULCERS (sore throat, sores in mouth, or a toothache) UNUSUAL RASH, SWELLING OR PAIN  UNUSUAL VAGINAL DISCHARGE OR ITCHING   Items with * indicate a potential emergency and should be followed up as soon as possible or go to the Emergency Department if any problems should occur.  Please show the CHEMOTHERAPY ALERT CARD or IMMUNOTHERAPY ALERT CARD at  check-in to the Emergency Department and triage nurse.  Should you have questions after your visit or need to cancel or reschedule your appointment, please contact [facility]  Dept: [phone]  and follow the prompts.  Office hours are 8:00 a.m. to 4:30 p.m. Monday - Friday. Please note that voicemails left after 4:00 p.m. may not be returned until the following business day.  We are closed weekends and major holidays. You have access to a nurse at all times for urgent questions. Please call the main number to the clinic Dept: [phone] and follow the prompts.   For any non-urgent questions, you may also contact your provider using MyChart. We now offer e-Visits for anyone 18 and older to request care online for non-urgent symptoms. For details visit [email or web address].   Also download the MyChart app! Go to the app store, search "MyChart", open the app, select , and log in with your MyChart username and password.   

## 2023-03-21 ENCOUNTER — Other Ambulatory Visit: Payer: Self-pay

## 2023-03-26 ENCOUNTER — Ambulatory Visit: Payer: Medicare HMO

## 2023-03-27 ENCOUNTER — Other Ambulatory Visit: Payer: Self-pay | Admitting: Radiology

## 2023-03-27 ENCOUNTER — Encounter: Payer: Self-pay | Admitting: Hematology

## 2023-03-28 NOTE — Consult Note (Signed)
Chief Complaint: Patient was seen in consultation today for Port-A-Cath placement  Referring Physician(s): Boscia,Heather E/Feng,Y  Supervising Physician: Roanna Banning  Patient Status: [facility] - Out-pt  History of Present Illness: [name] is a 82 y.o. male with past medical history significant for blindness in right eye, BPH, depression, diabetes, GERD, glaucoma, hyperlipidemia, hearing difficulty, hypertension, hypothyroidism, migraines, peripheral neuropathy, skin cancer and bladder cancer (initially diagnosed in 2022) with focal urothelial CIS noted in November 2023.  He has poor venous access and presents today for Port-A-Cath placement to assist with treatment.  Past Medical History:  Diagnosis Date   Bladder cancer (HCC)    Blind right eye    BPH (benign prostatic hypertrophy)    Cataracts, both eyes    Depression    DM type 2 (diabetes mellitus, type 2) (HCC)    DOE (dyspnea on exertion)    with heavy exertion   Full dentures    GERD (gastroesophageal reflux disease)    Glaucoma no peripheral or side vision   right eye blind   Glaucoma, right eye    HOH (hard of hearing)    Hyperlipidemia    Hypertension    Hypothyroidism    Local recurrence of cancer of urinary bladder (HCC) urologsit-  dr Retta Diones   1st dx 01-29-2016--  now recurrent 05-31-2016   Lower extremity edema 07/11/2020   right swells more than left goes down at night after sleeping   Lower urinary tract symptoms (LUTS) 2019   Migraine    Peripheral neuropathy    middle finger left hand   Pneumonia 2019   Skin abnormality    base of spine looks like pimple has had since 07-01-2020, no drainage area intact   Wears glasses     Past Surgical History:  Procedure Laterality Date   ANTERIOR CERVICAL DECOMP/DISCECTOMY FUSION  08-15-2000   C5 -- C6   ANTERIOR CERVICAL DECOMP/DISCECTOMY FUSION  03-04-2008   C6 -- C7   areas removed from back at spine  2021 jan and aug 2021   CYSTOSCOPY W/  RETROGRADES Left 05/11/2020   Procedure: Cystoscopy with left retrograde ureteropyelogram, resection of probable bladder tumor and left trigonal/ureteral orifice region, 3 cm in size, fluoroscopic interpretation, left ureteroscopy, placement of 6 French by 26 cm contour double-J stent without tether;  Surgeon: Marcine Matar, MD;  Location: [facility];  Service: Urology;  Laterality: Left;   CYSTOSCOPY W/ RETROGRADES Bilateral 06/24/2022   Procedure: CYSTOSCOPY WITH RETROGRADE PYELOGRAM;  Surgeon: Marcine Matar, MD;  Location: [facility];  Service: Urology;  Laterality: Bilateral;  1 HR   CYSTOSCOPY WITH BIOPSY N/A 01/29/2016   Procedure: CYSTOSCOPY WITH BIOPSY;  Surgeon: Marcine Matar, MD;  Location: [facility];  Service: Urology;  Laterality: N/A;   CYSTOSCOPY WITH BIOPSY N/A 05/31/2016   Procedure: CYSTOSCOPY WITH BIOPSY;  Surgeon: Marcine Matar, MD;  Location: [facility];  Service: Urology;  Laterality: N/A;   CYSTOSCOPY WITH BIOPSY N/A 06/24/2022   Procedure: CYSTOSCOPY WITH BIOPSY;  Surgeon: Marcine Matar, MD;  Location: [facility];  Service: Urology;  Laterality: N/A;   EYE SURGERY Right 1998 & 2000   Gluacoma   GLAUCOMA SURGERY  1998 and 2000   lower back  06/01/2019, 02-10-2020   nose cancer  areas removed  2019, 2021   x 2 / 3/ 2021 small area removed from nose   surgery for sacral fracture  40 yrs ago   TRANSURETHRAL RESECTION OF BLADDER TUMOR N/A  07/17/2020   Procedure: TRANSURETHRAL RESECTION OF BLADDER TUMOR (TURBT);  Surgeon: Marcine Matar, MD;  Location: [facility];  Service: Urology;  Laterality: N/A;    Allergies: Patient has no known allergies.  Medications: Prior to Admission medications   Medication Sig Start Date End Date Taking? Authorizing Provider  acetaminophen (TYLENOL) 500 MG tablet Take 1,000 mg by mouth every 8 (eight) hours as needed for moderate pain.    [provider]   amLODipine (NORVASC) 10 MG tablet Take 10 mg by mouth daily.    [provider]  Aspirin-Salicylamide-Caffeine (ARTHRITIS STRENGTH BC POWDER PO) Take 1 packet by mouth daily as needed (pain).    [provider]  atorvastatin (LIPITOR) 20 MG tablet Take 20 mg by mouth at bedtime.     [provider]  carteolol (OCUPRESS) 1 % ophthalmic solution Place 1 drop into the right eye every morning.    [provider]  cetirizine (ZYRTEC) 10 MG tablet Take 10 mg by mouth daily as needed for allergies.    [provider]  clotrimazole-betamethasone (LOTRISONE) lotion Apply 1 Application topically daily as needed (irritation).    [provider]  fluticasone (FLONASE) 50 MCG/ACT nasal spray Place 2 sprays into both nostrils daily as needed for allergies.     [provider]  glimepiride (AMARYL) 1 MG tablet Take 1 mg by mouth daily with breakfast.    [provider]  ibuprofen (ADVIL) 200 MG tablet Take 600 mg by mouth every 8 (eight) hours as needed for moderate pain.    [provider]  latanoprost (XALATAN) 0.005 % ophthalmic solution Place 1 drop into the right eye at bedtime.    [provider]  levothyroxine (SYNTHROID, LEVOTHROID) 100 MCG tablet Take 100 mcg by mouth daily before breakfast.     [provider]  losartan (COZAAR) 100 MG tablet Take 100 mg by mouth at bedtime.     [provider]  meloxicam (MOBIC) 15 MG tablet Take 15 mg by mouth daily.    [provider]  Misc Natural Products (COMPLETE PROSTATE HEALTH PO) Take 1 tablet by mouth 2 (two) times daily.    [provider]  omeprazole (PRILOSEC) 20 MG capsule Take 20 mg by mouth daily. 01/03/22   [provider]  oxybutynin (DITROPAN-XL) 5 MG 24 hr tablet TAKE 1 TABLET (5 MG TOTAL) BY MOUTH ONCE A WEEK. TAKE 2 HOURS BEFORE SCHEDULED CHEMO IN OUR OFFICE 10/15/22   Malachy Mood, MD  sitaGLIPtin (JANUVIA) 100 MG  tablet Take 100 mg by mouth every morning.    [provider]  tamsulosin (FLOMAX) 0.4 MG CAPS capsule Take 0.4 mg by mouth every morning.     [provider]  vitamin B-12 (CYANOCOBALAMIN) 500 MCG tablet Take 500 mcg by mouth daily.    [provider]     Family History  Problem Relation Age of Onset   Heart disease Mother    Heart disease Father    Breast cancer Sister 27   Breast cancer Sister 9       d. 86   Breast cancer Sister 38   Brain cancer Sister        dx and d. 57s   Bladder Cancer Brother 84   Lung cancer Brother        dx after 31   Lung cancer Brother 11   Skin cancer Daughter        melanoma and BCC in 51s  Cancer Nephew        3 nephews with lung, kidney, and unknown ca dx before age 5    Social History   Socioeconomic History   Marital status: Married    Spouse name: Not on file   Number of children: 1   Years of education: Not on file   Highest education level: Not on file  Occupational History   Not on file  Tobacco Use   Smoking status: Former    Current packs/day: 0.00    Average packs/day: 1 pack/day for 50.0 years (50.0 ttl pk-yrs)    Types: Cigarettes    Start date: 06/27/1954    Quit date: 06/27/2004    Years since quitting: 18.7   Smokeless tobacco: Never  Vaping Use   Vaping status: Never Used  Substance and Sexual Activity   Alcohol use: Yes    Alcohol/week: 0.0 standard drinks of alcohol    Comment: occ moonshine garles throat with   Drug use: No   Sexual activity: Not on file  Other Topics Concern   Not on file  Social History Narrative   Not on file   Social Determinants of Health   Financial Resource Strain: Not on file  Food Insecurity: Not on file  Transportation Needs: Not on file  Physical Activity: Not on file  Stress: Not on file  Social Connections: Not on file    .  Review of Systems denies fever,HA,CP,dyspnea, abd pain, N/V or bleeding; he does have occ cough, back pain, is  HOH  Vital Signs:   Vitals:   03/31/23 0950 03/31/23 1011  BP: (!) 158/70   Pulse: (!) 169 69  Resp: 18   Temp: 98.2 F (36.8 C)   SpO2: 100% 98%      Code Status: FULL CODE  Advance Care Plan: no documents on file    Physical Exam: awake/alert; chest- CTA bilat; heart- RRR; abd- soft,+BS,NT; no LE edema  Imaging: No results found.  Labs:  CBC: Recent Labs    10/09/22 1135 12/06/22 0843 01/29/23 0833 03/18/23 1240  WBC 5.1 5.3 6.4 6.8  HGB 12.7* 13.0 13.0 13.5  HCT 36.3* 38.4* 39.1 39.4  PLT 171 186 165 183    COAGS: Recent Labs    08/31/22 1423  INR 1.1  APTT 30    BMP: Recent Labs    10/09/22 1135 12/06/22 0843 01/29/23 0833 03/18/23 1240  NA 139 141 139 139  K 3.8 4.1 3.7 3.9  CL 107 106 103 106  CO2 27 29 30 28   GLUCOSE 149* 107* 175* 122*  BUN 13 12 14 16   CALCIUM 9.0 9.6 9.3 9.4  CREATININE 0.83 0.96 0.94 0.92  GFRNONAA >60 >60 >60 >60    LIVER FUNCTION TESTS: Recent Labs    10/09/22 1135 12/06/22 0843 01/29/23 0833 03/18/23 1240  BILITOT 0.4 0.3 0.5 0.4  AST 17 17 16 17   ALT 19 16 17 19   ALKPHOS 52 47 50 51  PROT 6.8 7.1 6.8 7.3  ALBUMIN 4.3 4.4 4.1 4.4    TUMOR MARKERS: No results for input(s): "AFPTM", "CEA", "CA199", "CHROMGRNA" in the last 8760 hours.  Assessment and Plan: 82 y.o. male with past medical history significant for blindness in right eye, BPH, depression, diabetes, GERD, glaucoma, hyperlipidemia, hearing difficulty, hypertension, hypothyroidism, migraines, peripheral neuropathy, skin cancer and bladder cancer (initially diagnosed in 2022) with focal urothelial CIS noted in November 2023.  He has poor venous access and presents today for Port-A-Cath placement  to assist with treatment.Risks and benefits of image guided port-a-catheter placement was discussed with the patient including, but not limited to bleeding, infection, pneumothorax, or fibrin sheath development and need for additional procedures.  All of  the patient's questions were answered, patient is agreeable to proceed. Consent signed and in chart.    Thank you for this interesting consult.  I greatly enjoyed meeting KHYRE SHETH and look forward to participating in their care.  A copy of this report was sent to the requesting provider on this date.  Electronically Signed: D. Jeananne Rama, PA-C 03/28/2023, 5:30 PM   I spent a total of   25 minutes  in face to face in clinical consultation, greater than 50% of which was counseling/coordinating care for Port-A-Cath placement

## 2023-03-31 ENCOUNTER — Encounter (HOSPITAL_COMMUNITY): Payer: Self-pay

## 2023-03-31 ENCOUNTER — Ambulatory Visit (HOSPITAL_COMMUNITY): Admission: RE | Admit: 2023-03-31 | Payer: Medicare HMO | Source: Ambulatory Visit

## 2023-03-31 ENCOUNTER — Other Ambulatory Visit: Payer: Self-pay

## 2023-03-31 ENCOUNTER — Encounter: Payer: Self-pay | Admitting: Nurse Practitioner

## 2023-03-31 ENCOUNTER — Ambulatory Visit (HOSPITAL_COMMUNITY)
Admission: RE | Admit: 2023-03-31 | Discharge: 2023-03-31 | Disposition: A | Discharge status: Home / Self Care | Payer: Medicare HMO | Source: Ambulatory Visit | Attending: Nurse Practitioner | Admitting: Nurse Practitioner

## 2023-03-31 DIAGNOSIS — E039 Hypothyroidism, unspecified: Secondary | ICD-10-CM | POA: Diagnosis not present

## 2023-03-31 DIAGNOSIS — H409 Unspecified glaucoma: Secondary | ICD-10-CM | POA: Diagnosis not present

## 2023-03-31 DIAGNOSIS — C670 Malignant neoplasm of trigone of bladder: Secondary | ICD-10-CM | POA: Insufficient documentation

## 2023-03-31 DIAGNOSIS — E1142 Type 2 diabetes mellitus with diabetic polyneuropathy: Secondary | ICD-10-CM | POA: Insufficient documentation

## 2023-03-31 DIAGNOSIS — K219 Gastro-esophageal reflux disease without esophagitis: Secondary | ICD-10-CM | POA: Insufficient documentation

## 2023-03-31 DIAGNOSIS — N400 Enlarged prostate without lower urinary tract symptoms: Secondary | ICD-10-CM | POA: Insufficient documentation

## 2023-03-31 DIAGNOSIS — H5461 Unqualified visual loss, right eye, normal vision left eye: Secondary | ICD-10-CM | POA: Diagnosis not present

## 2023-03-31 DIAGNOSIS — Z7984 Long term (current) use of oral hypoglycemic drugs: Secondary | ICD-10-CM | POA: Insufficient documentation

## 2023-03-31 DIAGNOSIS — Z87891 Personal history of nicotine dependence: Secondary | ICD-10-CM | POA: Diagnosis not present

## 2023-03-31 DIAGNOSIS — I10 Essential (primary) hypertension: Secondary | ICD-10-CM | POA: Insufficient documentation

## 2023-03-31 DIAGNOSIS — Z85828 Personal history of other malignant neoplasm of skin: Secondary | ICD-10-CM | POA: Diagnosis not present

## 2023-03-31 DIAGNOSIS — C801 Malignant (primary) neoplasm, unspecified: Secondary | ICD-10-CM | POA: Diagnosis not present

## 2023-03-31 DIAGNOSIS — E785 Hyperlipidemia, unspecified: Secondary | ICD-10-CM | POA: Diagnosis not present

## 2023-03-31 DIAGNOSIS — Z2989 Encounter for other specified prophylactic measures: Secondary | ICD-10-CM

## 2023-03-31 HISTORY — PX: IR IMAGING GUIDED PORT INSERTION: IMG5740

## 2023-03-31 LAB — GLUCOSE, CAPILLARY: Glucose-Capillary: 134 mg/dL — ABNORMAL HIGH (ref 70–99)

## 2023-03-31 MED ORDER — FENTANYL CITRATE (PF) 100 MCG/2ML IJ SOLN
INTRAMUSCULAR | Status: AC
  Filled 2023-03-31: qty 2

## 2023-03-31 MED ORDER — LIDOCAINE-EPINEPHRINE 1 %-1:100000 IJ SOLN
20.0000 mL | Freq: Once | INTRAMUSCULAR | Status: AC
  Administered 2023-03-31: 20 mL via INTRADERMAL

## 2023-03-31 MED ORDER — LIDOCAINE-EPINEPHRINE 1 %-1:100000 IJ SOLN
INTRAMUSCULAR | Status: AC
  Filled 2023-03-31: qty 1

## 2023-03-31 MED ORDER — SODIUM CHLORIDE 0.9 % IV SOLN: INTRAVENOUS | Status: DC

## 2023-03-31 MED ORDER — HEPARIN SOD (PORK) LOCK FLUSH 100 UNIT/ML IV SOLN
INTRAVENOUS | Status: AC
  Filled 2023-03-31: qty 5

## 2023-03-31 MED ORDER — HEPARIN SOD (PORK) LOCK FLUSH 100 UNIT/ML IV SOLN
500.0000 [IU] | Freq: Once | INTRAVENOUS | Status: AC
  Administered 2023-03-31: 500 [IU] via INTRAVENOUS

## 2023-03-31 MED ORDER — FENTANYL CITRATE (PF) 100 MCG/2ML IJ SOLN
INTRAMUSCULAR | Status: DC | PRN
  Administered 2023-03-31: 50 ug via INTRAVENOUS

## 2023-03-31 NOTE — Procedures (Signed)
Vascular and Interventional Radiology Procedure Note  Patient: Keith [name] DOB: 1941-07-20 Medical Record Number: [id number] Note Date/Time: 03/31/23 11:14 AM   Performing Physician: Roanna Banning, MD Assistant(s): None  Diagnosis:  Bladder CA  Procedure: PORT PLACEMENT  Anesthesia: Conscious Sedation Complications: None Estimated Blood Loss: Minimal  Findings:  Successful right-sided port placement, with the tip of the catheter in the proximal right atrium.  Plan: Catheter ready for use.  See detailed procedure note with images in PACS. The patient tolerated the procedure well without incident or complication and was returned to Recovery in stable condition.    Roanna Banning, MD [facility] Radiology   Pager. [phone] Clinic. [phone]

## 2023-03-31 NOTE — Discharge Instructions (Signed)

## 2023-04-08 ENCOUNTER — Inpatient Hospital Stay: Payer: Medicare HMO

## 2023-04-08 ENCOUNTER — Encounter: Payer: Self-pay | Admitting: Hematology

## 2023-04-08 ENCOUNTER — Inpatient Hospital Stay: Payer: Medicare HMO | Admitting: Hematology

## 2023-04-08 ENCOUNTER — Other Ambulatory Visit: Payer: Medicare HMO

## 2023-04-08 VITALS — BP 131/61 | HR 71 | Temp 98.0°F | Resp 18 | Ht 70.0 in | Wt 184.5 lb

## 2023-04-08 DIAGNOSIS — Z5112 Encounter for antineoplastic immunotherapy: Secondary | ICD-10-CM | POA: Diagnosis not present

## 2023-04-08 DIAGNOSIS — C671 Malignant neoplasm of dome of bladder: Secondary | ICD-10-CM | POA: Diagnosis not present

## 2023-04-08 DIAGNOSIS — Z79899 Other long term (current) drug therapy: Secondary | ICD-10-CM | POA: Diagnosis not present

## 2023-04-08 DIAGNOSIS — Z95828 Presence of other vascular implants and grafts: Secondary | ICD-10-CM

## 2023-04-08 DIAGNOSIS — C679 Malignant neoplasm of bladder, unspecified: Secondary | ICD-10-CM

## 2023-04-08 DIAGNOSIS — Z79620 Long term (current) use of immunosuppressive biologic: Secondary | ICD-10-CM | POA: Diagnosis not present

## 2023-04-08 DIAGNOSIS — C670 Malignant neoplasm of trigone of bladder: Secondary | ICD-10-CM | POA: Diagnosis not present

## 2023-04-08 LAB — CBC WITH DIFFERENTIAL (CANCER CENTER ONLY)
Abs Immature Granulocytes: 0.04 10*3/uL (ref 0.00–0.07)
Basophils Absolute: 0.1 10*3/uL (ref 0.0–0.1)
Basophils Relative: 1 %
Eosinophils Absolute: 0.3 10*3/uL (ref 0.0–0.5)
Eosinophils Relative: 4 %
HCT: 36.3 % — ABNORMAL LOW (ref 39.0–52.0)
Hemoglobin: 12.5 g/dL — ABNORMAL LOW (ref 13.0–17.0)
Immature Granulocytes: 1 %
Lymphocytes Relative: 24 %
Lymphs Abs: 1.7 10*3/uL (ref 0.7–4.0)
MCH: 31 pg (ref 26.0–34.0)
MCHC: 34.4 g/dL (ref 30.0–36.0)
MCV: 90.1 fL (ref 80.0–100.0)
Monocytes Absolute: 0.7 10*3/uL (ref 0.1–1.0)
Monocytes Relative: 11 %
Neutro Abs: 4.2 10*3/uL (ref 1.7–7.7)
Neutrophils Relative %: 59 %
Platelet Count: 171 10*3/uL (ref 150–400)
RBC: 4.03 MIL/uL — ABNORMAL LOW (ref 4.22–5.81)
RDW: 12.8 % (ref 11.5–15.5)
WBC Count: 7 10*3/uL (ref 4.0–10.5)
nRBC: 0 % (ref 0.0–0.2)

## 2023-04-08 LAB — CMP (CANCER CENTER ONLY)
ALT: 19 U/L (ref 0–44)
AST: 19 U/L (ref 15–41)
Albumin: 4.3 g/dL (ref 3.5–5.0)
Alkaline Phosphatase: 49 U/L (ref 38–126)
Anion gap: 5 (ref 5–15)
BUN: 13 mg/dL (ref 8–23)
CO2: 28 mmol/L (ref 22–32)
Calcium: 9.4 mg/dL (ref 8.9–10.3)
Chloride: 106 mmol/L (ref 98–111)
Creatinine: 0.98 mg/dL (ref 0.61–1.24)
GFR, Estimated: >60 mL/min (ref >60)
Glucose, Bld: 109 mg/dL — ABNORMAL HIGH (ref 70–99)
Potassium: 3.9 mmol/L (ref 3.5–5.1)
Sodium: 139 mmol/L (ref 135–145)
Total Bilirubin: 0.3 mg/dL (ref 0.3–1.2)
Total Protein: 7 g/dL (ref 6.5–8.1)

## 2023-04-08 MED ORDER — LIDOCAINE-PRILOCAINE 2.5-2.5 % EX CREA: 1.0000 | TOPICAL_CREAM | CUTANEOUS | 0 refills | Status: DC | PRN

## 2023-04-08 MED ORDER — SODIUM CHLORIDE 0.9% FLUSH
10.0000 mL | INTRAVENOUS | Status: DC | PRN
  Administered 2023-04-08: 10 mL

## 2023-04-08 MED ORDER — SODIUM CHLORIDE 0.9 % IV SOLN
200.0000 mg | Freq: Once | INTRAVENOUS | Status: AC
  Administered 2023-04-08: 200 mg via INTRAVENOUS
  Filled 2023-04-08: qty 200

## 2023-04-08 MED ORDER — HEPARIN SOD (PORK) LOCK FLUSH 100 UNIT/ML IV SOLN
500.0000 [IU] | Freq: Once | INTRAVENOUS | Status: AC | PRN
  Administered 2023-04-08: 500 [IU]

## 2023-04-08 MED ORDER — SODIUM CHLORIDE 0.9 % IV SOLN: Freq: Once | INTRAVENOUS | Status: AC

## 2023-04-08 NOTE — Progress Notes (Signed)
[name]   [phone] [phone]   Clinic Follow up Note   Patient Care Team: Charlane Ferretti, DO as PCP - General (Internal Medicine) Malachy Mood, MD as Attending Physician (Hematology and Oncology) Marcine Matar, MD as Consulting Physician (Urology)  Date of Service:  04/08/2023  CHIEF COMPLAINT: f/u of cancer of urinary bladder   CURRENT THERAPY:   Rande Lawman every 21 days   ASSESSMENT:  Keith [name] is a 82 y.o. male with   Bladder cancer (HCC) -initially diagnosed in 2017  -He was found to have high-grade urothelial carcinoma without muscle invasion and developed and subsequently BCG refractory disease in December 2021. He received Intravesicular gemcitabine with a total of 2000 mg weekly X6 started in February 2022  -bladder biopsies, bladder washings on 06/24/2022 showed focal urothelial carcinoma in situ (CIS) and cytology was positive for high grade urothelial carcinoma -I recommended intravesical gemcitabine 2000 mg weekly x 6, he started on 08/28/16.  He tolerated first cycle poorly with significant hematuria, and intermittent fever for week after treatment. -Due to his poor tolerance to intravesical gemcitabine, I changed therapy to intravesical mitomycin 40 mg every week for 5 weeks. He tolerated the treatment very well  -His surveillance cystoscopy and biopsy were negative for residual cancer -Given his good response to intravesical mitomycin, and his high risk of recurrence, Dr. Retta Diones recommend small intravesical therapy.  Patient agrees to proceed, started in in April 2024. Unfortunately he has had recurrent leakage from the intravesical therapy, and not able to continue. -Treatment was switched to [name] on March 18, 2023, he tolerated first cycle well.  Plan to continue every 3 weeks for up to 2 years if no recurrence.     PLAN: - I prescribe Lidocaine cream for port -Continue Keytruda q3 weeks for 2 years if no  recurrence - I will reach out to Dr. Kathrynn Running regarding his recent cystoscopy  -proceed with Keytruda today -lab/flush  and f/u 9/17  SUMMARY OF ONCOLOGIC HISTORY: Oncology History Overview Note   Cancer Staging  Bladder cancer [facility]) Staging form: Urinary Bladder, AJCC 8th Edition - Clinical: Stage I (cT1, cN0, cM0) - Signed by Benjiman Core, MD on 09/12/2020 WHO/ISUP grade (low/high): High Grade Histologic grading system: 2 grade system - Pathologic stage from 06/24/2022: Stage 0is (pTis, pN0, cM0) - Signed by Malachy Mood, MD on 08/24/2022 Stage prefix: Initial diagnosis WHO/ISUP grade (low/high): High Grade Histologic grading system: 2 grade system     Bladder cancer (HCC)  09/12/2020 Initial Diagnosis   Bladder cancer (HCC)   09/12/2020 Cancer Staging   Staging form: Urinary Bladder, AJCC 8th Edition - Clinical: Stage I (cT1, cN0, cM0) - Signed by Benjiman Core, MD on 09/12/2020 WHO/ISUP grade (low/high): High Grade Histologic grading system: 2 grade system   06/24/2022 Pathology Results             FINAL MICROSCOPIC DIAGNOSIS:  A. BLADDER, BIOPSY: Focal urothelial carcinoma in situ (CIS)     06/24/2022 Cancer Staging   Staging form: Urinary Bladder, AJCC 8th Edition - Pathologic stage from 06/24/2022: Stage 0is (pTis, pN0, cM0) - Signed by Malachy Mood, MD on 08/24/2022 Stage prefix: Initial diagnosis WHO/ISUP grade (low/high): High Grade Histologic grading system: 2 grade system   08/28/2022 - 08/28/2022 Chemotherapy   Patient is on Treatment Plan : BLADDER Gemcitabine INTRAVESICAL (2000) q7d     09/12/2022 - 02/26/2023 Chemotherapy   Patient is on Treatment Plan : BLADDER Mitomycin INTRAVESICAL (40 mg) q7d  10/25/2022 Genetic Testing   Negative [facility] CancerNext-Expanded +RNA Panel.  Report date is 10/25/2022.   The CancerNext-Expanded gene panel offered by [facility] and includes sequencing, rearrangement, and RNA analysis for the following 77 genes: AIP, ALK,  APC, ATM, AXIN2, BAP1, BARD1, BLM, BMPR1A, BRCA1, BRCA2, BRIP1, CDC73, CDH1, CDK4, CDKN1B, CDKN2A, CHEK2, CTNNA1, DICER1, FANCC, FH, FLCN, GALNT12, KIF1B, LZTR1, MAX, MEN1, MET, MLH1, MSH2, MSH3, MSH6, MUTYH, NBN, NF1, NF2, NTHL1, PALB2, PHOX2B, PMS2, POT1, PRKAR1A, PTCH1, PTEN, RAD51C, RAD51D, RB1, RECQL, RET, SDHA, SDHAF2, SDHB, SDHC, SDHD, SMAD4, SMARCA4, SMARCB1, SMARCE1, STK11, SUFU, TMEM127, TP53, TSC1, TSC2, VHL and XRCC2 (sequencing and deletion/duplication); EGFR, EGLN1, HOXB13, KIT, MITF, PDGFRA, POLD1, and POLE (sequencing only); EPCAM and GREM1 (deletion/duplication only).    03/18/2023 -  Chemotherapy   Patient is on Treatment Plan : BLADDER Pembrolizumab (200) q21d     Malignant neoplasm of trigone of urinary bladder (HCC)  09/12/2020 Initial Diagnosis   Malignant neoplasm of trigone of urinary bladder (HCC)   09/26/2020 - 10/31/2020 Chemotherapy   Patient is on Treatment Plan : BLADDER Gemcitabine q7d        INTERVAL HISTORY:  Keith [name] is here for a follow up of cancer of urinary bladder. He was last seen by me on 03/18/2023. He presents to the clinic accompanied by wife.Pt state that he had no issues from last treatment. Pt state that he urinates frequent, but not much come out.     All other systems were reviewed with the patient and are negative.  MEDICAL HISTORY:  Past Medical History:  Diagnosis Date   Bladder cancer (HCC)    Blind right eye    BPH (benign prostatic hypertrophy)    Cataracts, both eyes    Depression    DM type 2 (diabetes mellitus, type 2) (HCC)    DOE (dyspnea on exertion)    with heavy exertion   Full dentures    GERD (gastroesophageal reflux disease)    Glaucoma no peripheral or side vision   right eye blind   Glaucoma, right eye    HOH (hard of hearing)    Hyperlipidemia    Hypertension    Hypothyroidism    Local recurrence of cancer of urinary bladder (HCC) urologsit-  dr Retta Diones   1st dx 01-29-2016--  now recurrent 05-31-2016    Lower extremity edema 07/11/2020   right swells more than left goes down at night after sleeping   Lower urinary tract symptoms (LUTS) 2019   Migraine    Peripheral neuropathy    middle finger left hand   Pneumonia 2019   Skin abnormality    base of spine looks like pimple has had since 07-01-2020, no drainage area intact   Wears glasses     SURGICAL HISTORY: Past Surgical History:  Procedure Laterality Date   ANTERIOR CERVICAL DECOMP/DISCECTOMY FUSION  08-15-2000   C5 -- C6   ANTERIOR CERVICAL DECOMP/DISCECTOMY FUSION  03-04-2008   C6 -- C7   areas removed from back at spine  2021 jan and aug 2021   CYSTOSCOPY W/ RETROGRADES Left 05/11/2020   Procedure: Cystoscopy with left retrograde ureteropyelogram, resection of probable bladder tumor and left trigonal/ureteral orifice region, 3 cm in size, fluoroscopic interpretation, left ureteroscopy, placement of 6 French by 26 cm contour double-J stent without tether;  Surgeon: Marcine Matar, MD;  Location: [facility];  Service: Urology;  Laterality: Left;   CYSTOSCOPY W/ RETROGRADES Bilateral 06/24/2022   Procedure: CYSTOSCOPY WITH RETROGRADE PYELOGRAM;  Surgeon:  Marcine Matar, MD;  Location: [facility];  Service: Urology;  Laterality: Bilateral;  1 HR   CYSTOSCOPY WITH BIOPSY N/A 01/29/2016   Procedure: CYSTOSCOPY WITH BIOPSY;  Surgeon: Marcine Matar, MD;  Location: [facility];  Service: Urology;  Laterality: N/A;   CYSTOSCOPY WITH BIOPSY N/A 05/31/2016   Procedure: CYSTOSCOPY WITH BIOPSY;  Surgeon: Marcine Matar, MD;  Location: [facility];  Service: Urology;  Laterality: N/A;   CYSTOSCOPY WITH BIOPSY N/A 06/24/2022   Procedure: CYSTOSCOPY WITH BIOPSY;  Surgeon: Marcine Matar, MD;  Location: [facility];  Service: Urology;  Laterality: N/A;   EYE SURGERY Right 1998 & 2000   Gluacoma   GLAUCOMA SURGERY  1998 and 2000   IR IMAGING GUIDED PORT INSERTION  03/31/2023   lower back   06/01/2019, 02-10-2020   nose cancer  areas removed  2019, 2021   x 2 / 3/ 2021 small area removed from nose   surgery for sacral fracture  40 yrs ago   TRANSURETHRAL RESECTION OF BLADDER TUMOR N/A 07/17/2020   Procedure: TRANSURETHRAL RESECTION OF BLADDER TUMOR (TURBT);  Surgeon: Marcine Matar, MD;  Location: [name];  Service: Urology;  Laterality: N/A;    I have reviewed the social history and family history with the patient and they are unchanged from previous note.  ALLERGIES:  has No Known Allergies.  MEDICATIONS:  Current Outpatient Medications  Medication Sig Dispense Refill   lidocaine-prilocaine (EMLA) cream Apply 1 Application topically as needed. 30 g 0   acetaminophen (TYLENOL) 500 MG tablet Take 1,000 mg by mouth every 8 (eight) hours as needed for moderate pain.     amLODipine (NORVASC) 10 MG tablet Take 10 mg by mouth daily.     Aspirin-Salicylamide-Caffeine (ARTHRITIS STRENGTH BC POWDER PO) Take 1 packet by mouth daily as needed (pain).     atorvastatin (LIPITOR) 20 MG tablet Take 20 mg by mouth at bedtime.      carteolol (OCUPRESS) 1 % ophthalmic solution Place 1 drop into the right eye every morning.     cetirizine (ZYRTEC) 10 MG tablet Take 10 mg by mouth daily as needed for allergies.     clotrimazole-betamethasone (LOTRISONE) lotion Apply 1 Application topically daily as needed (irritation).     fluticasone (FLONASE) 50 MCG/ACT nasal spray Place 2 sprays into both nostrils daily as needed for allergies.      glimepiride (AMARYL) 1 MG tablet Take 1 mg by mouth daily with breakfast.     ibuprofen (ADVIL) 200 MG tablet Take 600 mg by mouth every 8 (eight) hours as needed for moderate pain.     latanoprost (XALATAN) 0.005 % ophthalmic solution Place 1 drop into the right eye at bedtime.     levothyroxine (SYNTHROID, LEVOTHROID) 100 MCG tablet Take 100 mcg by mouth daily before breakfast.      losartan (COZAAR) 100 MG tablet Take 100 mg by mouth at  bedtime.      meloxicam (MOBIC) 15 MG tablet Take 15 mg by mouth daily.     Misc Natural Products (COMPLETE PROSTATE HEALTH PO) Take 1 tablet by mouth 2 (two) times daily.     omeprazole (PRILOSEC) 20 MG capsule Take 20 mg by mouth daily.     oxybutynin (DITROPAN-XL) 5 MG 24 hr tablet TAKE 1 TABLET (5 MG TOTAL) BY MOUTH ONCE A WEEK. TAKE 2 HOURS BEFORE SCHEDULED CHEMO IN OUR OFFICE 90 tablet 1   oxycodone (OXY-IR) 5 MG capsule Take 5 mg by mouth  every 6 (six) hours as needed for pain.     sitaGLIPtin (JANUVIA) 100 MG tablet Take 100 mg by mouth every morning.     tamsulosin (FLOMAX) 0.4 MG CAPS capsule Take 0.4 mg by mouth every morning.      vitamin B-12 (CYANOCOBALAMIN) 500 MCG tablet Take 500 mcg by mouth daily.     No current facility-administered medications for this visit.    PHYSICAL EXAMINATION: ECOG PERFORMANCE STATUS: 1 - Symptomatic but completely ambulatory  Vitals:   04/08/23 1421  BP: 131/61  Pulse: 71  Resp: 18  Temp: 98 F (36.7 C)  SpO2: 98%   Wt Readings from Last 3 Encounters:  04/08/23 184 lb 8 oz (83.7 kg)  03/31/23 184 lb (83.5 kg)  03/18/23 184 lb 1.6 oz (83.5 kg)     GENERAL:alert, no distress and comfortable SKIN: skin color normal, no rashes or significant lesions EYES: normal, Conjunctiva are pink and non-injected, sclera clear  NEURO: alert & oriented x 3 with fluent speech  LABORATORY DATA:  I have reviewed the data as listed    Latest Ref Rng & Units 04/08/2023    1:56 PM 03/18/2023   12:40 PM 01/29/2023    8:33 AM  CBC  WBC 4.0 - 10.5 K/uL 7.0  6.8  6.4   Hemoglobin 13.0 - 17.0 g/dL 52.8  41.3  24.4   Hematocrit 39.0 - 52.0 % 36.3  39.4  39.1   Platelets 150 - 400 K/uL 171  183  165         Latest Ref Rng & Units 04/08/2023    1:56 PM 03/18/2023   12:40 PM 01/29/2023    8:33 AM  CMP  Glucose 70 - 99 mg/dL 010  272  536   BUN 8 - 23 mg/dL 13  16  14    Creatinine 0.61 - 1.24 mg/dL 6.44  0.34  7.42   Sodium 135 - 145 mmol/L 139  139  139    Potassium 3.5 - 5.1 mmol/L 3.9  3.9  3.7   Chloride 98 - 111 mmol/L 106  106  103   CO2 22 - 32 mmol/L 28  28  30    Calcium 8.9 - 10.3 mg/dL 9.4  9.4  9.3   Total Protein 6.5 - 8.1 g/dL 7.0  7.3  6.8   Total Bilirubin 0.3 - 1.2 mg/dL 0.3  0.4  0.5   Alkaline Phos 38 - 126 U/L 49  51  50   AST 15 - 41 U/L 19  17  16    ALT 0 - 44 U/L 19  19  17        RADIOGRAPHIC STUDIES: I have personally reviewed the radiological images as listed and agreed with the findings in the report. No results found.    Orders Placed This Encounter  Procedures   CBC with Differential (Cancer Center Only)    Standing Status:   Future    Standing Expiration Date:   05/20/2024   CMP (Cancer Center only)    Standing Status:   Future    Standing Expiration Date:   05/20/2024   CBC with Differential (Cancer Center Only)    Standing Status:   Future    Standing Expiration Date:   06/10/2024   CMP (Cancer Center only)    Standing Status:   Future    Standing Expiration Date:   06/10/2024   All questions were answered. The patient knows to call the clinic with any problems, questions  or concerns. No barriers to learning was detected. The total time spent in the appointment was 25 minutes.     Malachy Mood, MD 04/08/2023   Carolin Coy, CMA, am acting as scribe for Malachy Mood, MD.   I have reviewed the above documentation for accuracy and completeness, and I agree with the above.

## 2023-04-08 NOTE — Assessment & Plan Note (Signed)
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 He received Intravesicular gemcitabine with a total of 2000 mg weekly X6 started in February 2022  -bladder biopsies, bladder washings on 06/24/2022 showed focal urothelial carcinoma in situ (CIS) and cytology was positive for high grade urothelial carcinoma -I recommended intravesical gemcitabine 2000 mg weekly x 6, he started on 08/28/16.  He tolerated first cycle poorly with significant hematuria, and intermittent fever for week after treatment. -Due to his poor tolerance to intravesical gemcitabine, I changed therapy to intravesical mitomycin 40 mg every week for 5 weeks. He tolerated the treatment very well  -His surveillance cystoscopy and biopsy were negative for residual cancer -Given his good response to intravesical mitomycin, and his high risk of recurrence, Dr. Retta Diones recommend small intravesical therapy.  Patient agrees to proceed, started in in April 2024. Unfortunately he has had recurrent leakage from the intravesical therapy, and not able to continue. -Treatment was switched to [facility] on March 18, 2023, he tolerated first cycle well.  Plan to continue every 3 weeks for up to 2 years if no recurrence.

## 2023-04-08 NOTE — Patient Instructions (Signed)
[facility]  Discharge Instructions: Thank you for choosing [facility] to provide your oncology and hematology care.   If you have a lab appointment with the Cancer Center, please go directly to the Cancer Center and check in at the registration area.   Wear comfortable clothing and clothing appropriate for easy access to any Portacath or PICC line.   We strive to give you quality time with your provider. You may need to reschedule your appointment if you arrive late (15 or more minutes).  Arriving late affects you and other patients whose appointments are after yours.  Also, if you miss three or more appointments without notifying the office, you may be dismissed from the clinic at the provider's discretion.      For prescription refill requests, have your pharmacy contact our office and allow 72 hours for refills to be completed.    Today you received the following chemotherapy and/or immunotherapy agents: pembrolizumab      To help prevent nausea and vomiting after your treatment, we encourage you to take your nausea medication as directed.  BELOW ARE SYMPTOMS THAT SHOULD BE REPORTED IMMEDIATELY: *FEVER GREATER THAN 100.4 F (38 C) OR HIGHER *CHILLS OR SWEATING *NAUSEA AND VOMITING THAT IS NOT CONTROLLED WITH YOUR NAUSEA MEDICATION *UNUSUAL SHORTNESS OF BREATH *UNUSUAL BRUISING OR BLEEDING *URINARY PROBLEMS (pain or burning when urinating, or frequent urination) *BOWEL PROBLEMS (unusual diarrhea, constipation, pain near the anus) TENDERNESS IN MOUTH AND THROAT WITH OR WITHOUT PRESENCE OF ULCERS (sore throat, sores in mouth, or a toothache) UNUSUAL RASH, SWELLING OR PAIN  UNUSUAL VAGINAL DISCHARGE OR ITCHING   Items with * indicate a potential emergency and should be followed up as soon as possible or go to the Emergency Department if any problems should occur.  Please show the CHEMOTHERAPY ALERT CARD or IMMUNOTHERAPY ALERT CARD at  check-in to the Emergency Department and triage nurse.  Should you have questions after your visit or need to cancel or reschedule your appointment, please contact [facility]  Dept: [phone]  and follow the prompts.  Office hours are 8:00 a.m. to 4:30 p.m. Monday - Friday. Please note that voicemails left after 4:00 p.m. may not be returned until the following business day.  We are closed weekends and major holidays. You have access to a nurse at all times for urgent questions. Please call the main number to the clinic Dept: [phone] and follow the prompts.   For any non-urgent questions, you may also contact your provider using MyChart. We now offer e-Visits for anyone 18 and older to request care online for non-urgent symptoms. For details visit [email or web address].   Also download the MyChart app! Go to the app store, search "MyChart", open the app, select , and log in with your MyChart username and password.   

## 2023-04-09 ENCOUNTER — Other Ambulatory Visit: Payer: Self-pay

## 2023-04-11 ENCOUNTER — Other Ambulatory Visit: Payer: Self-pay

## 2023-04-16 ENCOUNTER — Other Ambulatory Visit: Payer: Self-pay

## 2023-04-23 ENCOUNTER — Other Ambulatory Visit: Payer: Medicare HMO

## 2023-04-23 ENCOUNTER — Ambulatory Visit: Payer: Medicare HMO | Admitting: Hematology

## 2023-04-23 ENCOUNTER — Ambulatory Visit: Payer: Medicare HMO

## 2023-04-28 NOTE — Assessment & Plan Note (Signed)
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 He received Intravesicular gemcitabine with a total of 2000 mg weekly X6 started in February 2022  -bladder biopsies, bladder washings on 06/24/2022 showed focal urothelial carcinoma in situ (CIS) and cytology was positive for high grade urothelial carcinoma -I recommended intravesical gemcitabine 2000 mg weekly x 6, he started on 08/28/16.  He tolerated first cycle poorly with significant hematuria, and intermittent fever for week after treatment. -Due to his poor tolerance to intravesical gemcitabine, I changed therapy to intravesical mitomycin 40 mg every week for 5 weeks. He tolerated the treatment very well  -His surveillance cystoscopy and biopsy were negative for residual cancer -Given his good response to intravesical mitomycin, and his high risk of recurrence, Dr. Retta Diones recommend small intravesical therapy.  Patient agrees to proceed, started in in April 2024. Unfortunately he has had recurrent leakage from the intravesical therapy, and not able to continue. -Treatment was switched to [facility] on March 18, 2023, he tolerated first cycle well.  Plan to continue every 3 weeks for up to 2 years if no recurrence.

## 2023-04-29 ENCOUNTER — Inpatient Hospital Stay: Payer: Medicare HMO | Admitting: Hematology

## 2023-04-29 ENCOUNTER — Encounter: Payer: Self-pay | Admitting: Hematology

## 2023-04-29 ENCOUNTER — Inpatient Hospital Stay: Payer: Medicare HMO | Attending: Hematology

## 2023-04-29 ENCOUNTER — Inpatient Hospital Stay: Payer: Medicare HMO

## 2023-04-29 ENCOUNTER — Other Ambulatory Visit: Payer: Medicare HMO

## 2023-04-29 ENCOUNTER — Other Ambulatory Visit: Payer: Self-pay

## 2023-04-29 VITALS — BP 145/66 | HR 72 | Temp 98.6°F | Ht 70.0 in | Wt 185.2 lb

## 2023-04-29 VITALS — BP 150/52 | HR 64 | Temp 98.0°F | Resp 20

## 2023-04-29 DIAGNOSIS — Z5112 Encounter for antineoplastic immunotherapy: Secondary | ICD-10-CM | POA: Insufficient documentation

## 2023-04-29 DIAGNOSIS — C679 Malignant neoplasm of bladder, unspecified: Secondary | ICD-10-CM

## 2023-04-29 DIAGNOSIS — C671 Malignant neoplasm of dome of bladder: Secondary | ICD-10-CM | POA: Diagnosis not present

## 2023-04-29 DIAGNOSIS — C670 Malignant neoplasm of trigone of bladder: Secondary | ICD-10-CM | POA: Insufficient documentation

## 2023-04-29 DIAGNOSIS — Z79899 Other long term (current) drug therapy: Secondary | ICD-10-CM | POA: Diagnosis not present

## 2023-04-29 DIAGNOSIS — Z79620 Long term (current) use of immunosuppressive biologic: Secondary | ICD-10-CM | POA: Insufficient documentation

## 2023-04-29 DIAGNOSIS — Z95828 Presence of other vascular implants and grafts: Secondary | ICD-10-CM

## 2023-04-29 LAB — CMP (CANCER CENTER ONLY)
ALT: 19 U/L (ref 0–44)
AST: 17 U/L (ref 15–41)
Albumin: 4.3 g/dL (ref 3.5–5.0)
Alkaline Phosphatase: 50 U/L (ref 38–126)
Anion gap: 5 (ref 5–15)
BUN: 14 mg/dL (ref 8–23)
CO2: 28 mmol/L (ref 22–32)
Calcium: 9.3 mg/dL (ref 8.9–10.3)
Chloride: 106 mmol/L (ref 98–111)
Creatinine: 0.97 mg/dL (ref 0.61–1.24)
GFR, Estimated: >60 mL/min (ref >60)
Glucose, Bld: 145 mg/dL — ABNORMAL HIGH (ref 70–99)
Potassium: 4 mmol/L (ref 3.5–5.1)
Sodium: 139 mmol/L (ref 135–145)
Total Bilirubin: 0.4 mg/dL (ref 0.3–1.2)
Total Protein: 7.1 g/dL (ref 6.5–8.1)

## 2023-04-29 LAB — CBC WITH DIFFERENTIAL (CANCER CENTER ONLY)
Abs Immature Granulocytes: 0.03 10*3/uL (ref 0.00–0.07)
Basophils Absolute: 0 10*3/uL (ref 0.0–0.1)
Basophils Relative: 1 %
Eosinophils Absolute: 0.3 10*3/uL (ref 0.0–0.5)
Eosinophils Relative: 5 %
HCT: 36.6 % — ABNORMAL LOW (ref 39.0–52.0)
Hemoglobin: 12.4 g/dL — ABNORMAL LOW (ref 13.0–17.0)
Immature Granulocytes: 1 %
Lymphocytes Relative: 23 %
Lymphs Abs: 1.3 10*3/uL (ref 0.7–4.0)
MCH: 30.7 pg (ref 26.0–34.0)
MCHC: 33.9 g/dL (ref 30.0–36.0)
MCV: 90.6 fL (ref 80.0–100.0)
Monocytes Absolute: 0.5 10*3/uL (ref 0.1–1.0)
Monocytes Relative: 9 %
Neutro Abs: 3.6 10*3/uL (ref 1.7–7.7)
Neutrophils Relative %: 61 %
Platelet Count: 162 10*3/uL (ref 150–400)
RBC: 4.04 MIL/uL — ABNORMAL LOW (ref 4.22–5.81)
RDW: 12.9 % (ref 11.5–15.5)
WBC Count: 5.8 10*3/uL (ref 4.0–10.5)
nRBC: 0 % (ref 0.0–0.2)

## 2023-04-29 LAB — TSH: TSH: 1.037 u[IU]/mL (ref 0.350–4.500)

## 2023-04-29 MED ORDER — SODIUM CHLORIDE 0.9 % IV SOLN: Freq: Once | INTRAVENOUS | Status: AC

## 2023-04-29 MED ORDER — SODIUM CHLORIDE 0.9% FLUSH
10.0000 mL | INTRAVENOUS | Status: DC | PRN
  Administered 2023-04-29: 10 mL

## 2023-04-29 MED ORDER — HEPARIN SOD (PORK) LOCK FLUSH 100 UNIT/ML IV SOLN
500.0000 [IU] | Freq: Once | INTRAVENOUS | Status: AC | PRN
  Administered 2023-04-29: 500 [IU]

## 2023-04-29 MED ORDER — SODIUM CHLORIDE 0.9 % IV SOLN
200.0000 mg | Freq: Once | INTRAVENOUS | Status: AC
  Administered 2023-04-29: 200 mg via INTRAVENOUS
  Filled 2023-04-29: qty 8

## 2023-04-29 NOTE — Progress Notes (Signed)
[facility]   Telephone:(336) [phone] Fax:([phone]) [phone]   Clinic Follow up Note   Patient Care Team: Charlane Ferretti, DO as PCP - General (Internal Medicine) Malachy Mood, MD as Attending Physician (Hematology and Oncology) Marcine Matar, MD as Consulting Physician (Urology)  Date of Service:  04/29/2023  CHIEF COMPLAINT: f/u of cancer of urinary bladder   CURRENT THERAPY:  Rande Lawman every 21 days   ASSESSMENT:  Keith [name] is a 82 y.o. male with   Bladder cancer (HCC) -initially diagnosed in 2017  -He was found to have high-grade urothelial carcinoma without muscle invasion and developed and subsequently BCG refractory disease in December 2021. He received Intravesicular gemcitabine with a total of 2000 mg weekly X6 started in February 2022  -bladder biopsies, bladder washings on 06/24/2022 showed focal urothelial carcinoma in situ (CIS) and cytology was positive for high grade urothelial carcinoma -I recommended intravesical gemcitabine 2000 mg weekly x 6, he started on 08/28/16.  He tolerated first cycle poorly with significant hematuria, and intermittent fever for week after treatment. -Due to his poor tolerance to intravesical gemcitabine, I changed therapy to intravesical mitomycin 40 mg every week for 5 weeks. He tolerated the treatment very well  -His surveillance cystoscopy and biopsy were negative for residual cancer -Given his good response to intravesical mitomycin, and his high risk of recurrence, Dr. Retta Diones recommend small intravesical therapy.  Patient agrees to proceed, started in in April 2024. Unfortunately he has had recurrent leakage from the intravesical therapy, and not able to continue. -Treatment was switched to [facility] on March 18, 2023, he tolerated first cycle well.  Plan to continue every 3 weeks for up to 2 years if no recurrence. -He is overall tolerating treatment well, with mild fatigue and chronic back/hip pain, no significant  toxicity, will continue treatment. -Will follow-up with his urologist for cystoscopy monitoring.     PLAN: -lab reviewed -CMP pending -Proceed with Keytruda today -Pt has an appointment schedule with Dr. Kathrynn Running 10/22 -lab/flush and f/u in 10/8  SUMMARY OF ONCOLOGIC HISTORY: Oncology History Overview Note   Cancer Staging  Bladder cancer [facility]) Staging form: Urinary Bladder, AJCC 8th Edition - Clinical: Stage I (cT1, cN0, cM0) - Signed by Benjiman Core, MD on 09/12/2020 WHO/ISUP grade (low/high): High Grade Histologic grading system: 2 grade system - Pathologic stage from 06/24/2022: Stage 0is (pTis, pN0, cM0) - Signed by Malachy Mood, MD on 08/24/2022 Stage prefix: Initial diagnosis WHO/ISUP grade (low/high): High Grade Histologic grading system: 2 grade system     Bladder cancer (HCC)  09/12/2020 Initial Diagnosis   Bladder cancer (HCC)   09/12/2020 Cancer Staging   Staging form: Urinary Bladder, AJCC 8th Edition - Clinical: Stage I (cT1, cN0, cM0) - Signed by Benjiman Core, MD on 09/12/2020 WHO/ISUP grade (low/high): High Grade Histologic grading system: 2 grade system   06/24/2022 Pathology Results             FINAL MICROSCOPIC DIAGNOSIS:  A. BLADDER, BIOPSY: Focal urothelial carcinoma in situ (CIS)     06/24/2022 Cancer Staging   Staging form: Urinary Bladder, AJCC 8th Edition - Pathologic stage from 06/24/2022: Stage 0is (pTis, pN0, cM0) - Signed by Malachy Mood, MD on 08/24/2022 Stage prefix: Initial diagnosis WHO/ISUP grade (low/high): High Grade Histologic grading system: 2 grade system   08/28/2022 - 08/28/2022 Chemotherapy   Patient is on Treatment Plan : BLADDER Gemcitabine INTRAVESICAL (2000) q7d     09/12/2022 - 02/26/2023 Chemotherapy   Patient is on  Treatment Plan : BLADDER Mitomycin INTRAVESICAL (40 mg) q7d     10/25/2022 Genetic Testing   Negative [facility] CancerNext-Expanded +RNA Panel.  Report date is 10/25/2022.   The CancerNext-Expanded gene panel  offered by [facility] and includes sequencing, rearrangement, and RNA analysis for the following 77 genes: AIP, ALK, APC, ATM, AXIN2, BAP1, BARD1, BLM, BMPR1A, BRCA1, BRCA2, BRIP1, CDC73, CDH1, CDK4, CDKN1B, CDKN2A, CHEK2, CTNNA1, DICER1, FANCC, FH, FLCN, GALNT12, KIF1B, LZTR1, MAX, MEN1, MET, MLH1, MSH2, MSH3, MSH6, MUTYH, NBN, NF1, NF2, NTHL1, PALB2, PHOX2B, PMS2, POT1, PRKAR1A, PTCH1, PTEN, RAD51C, RAD51D, RB1, RECQL, RET, SDHA, SDHAF2, SDHB, SDHC, SDHD, SMAD4, SMARCA4, SMARCB1, SMARCE1, STK11, SUFU, TMEM127, TP53, TSC1, TSC2, VHL and XRCC2 (sequencing and deletion/duplication); EGFR, EGLN1, HOXB13, KIT, MITF, PDGFRA, POLD1, and POLE (sequencing only); EPCAM and GREM1 (deletion/duplication only).    03/18/2023 -  Chemotherapy   Patient is on Treatment Plan : BLADDER Pembrolizumab (200) q21d     Malignant neoplasm of trigone of urinary bladder (HCC)  09/12/2020 Initial Diagnosis   Malignant neoplasm of trigone of urinary bladder (HCC)   09/26/2020 - 10/31/2020 Chemotherapy   Patient is on Treatment Plan : BLADDER Gemcitabine q7d        INTERVAL HISTORY:  Keith [name] is here for a follow up of  cancer of urinary bladder.He was last seen by me on 04/08/2023. He presents to the clinic accompanied by wife. Pt state that he has some fatigue. Pt denies having any side effects from last treatment. Pt state that his hips hurt and feel weak. This started a week ago.Pt takes 3 tylenol before he goes walking. Pt feels cold only at night.    All other systems were reviewed with the patient and are negative.  MEDICAL HISTORY:  Past Medical History:  Diagnosis Date   Bladder cancer (HCC)    Blind right eye    BPH (benign prostatic hypertrophy)    Cataracts, both eyes    Depression    DM type 2 (diabetes mellitus, type 2) (HCC)    DOE (dyspnea on exertion)    with heavy exertion   Full dentures    GERD (gastroesophageal reflux disease)    Glaucoma no peripheral or side vision   right eye  blind   Glaucoma, right eye    HOH (hard of hearing)    Hyperlipidemia    Hypertension    Hypothyroidism    Local recurrence of cancer of urinary bladder (HCC) urologsit-  dr Retta Diones   1st dx 01-29-2016--  now recurrent 05-31-2016   Lower extremity edema 07/11/2020   right swells more than left goes down at night after sleeping   Lower urinary tract symptoms (LUTS) 2019   Migraine    Peripheral neuropathy    middle finger left hand   Pneumonia 2019   Skin abnormality    base of spine looks like pimple has had since 07-01-2020, no drainage area intact   Wears glasses     SURGICAL HISTORY: Past Surgical History:  Procedure Laterality Date   ANTERIOR CERVICAL DECOMP/DISCECTOMY FUSION  08-15-2000   C5 -- C6   ANTERIOR CERVICAL DECOMP/DISCECTOMY FUSION  03-04-2008   C6 -- C7   areas removed from back at spine  2021 jan and aug 2021   CYSTOSCOPY W/ RETROGRADES Left 05/11/2020   Procedure: Cystoscopy with left retrograde ureteropyelogram, resection of probable bladder tumor and left trigonal/ureteral orifice region, 3 cm in size, fluoroscopic interpretation, left ureteroscopy, placement of 6 French by 26 cm contour double-J stent  without tether;  Surgeon: Marcine Matar, MD;  Location: [facility];  Service: Urology;  Laterality: Left;   CYSTOSCOPY W/ RETROGRADES Bilateral 06/24/2022   Procedure: CYSTOSCOPY WITH RETROGRADE PYELOGRAM;  Surgeon: Marcine Matar, MD;  Location: [facility];  Service: Urology;  Laterality: Bilateral;  1 HR   CYSTOSCOPY WITH BIOPSY N/A 01/29/2016   Procedure: CYSTOSCOPY WITH BIOPSY;  Surgeon: Marcine Matar, MD;  Location: [facility];  Service: Urology;  Laterality: N/A;   CYSTOSCOPY WITH BIOPSY N/A 05/31/2016   Procedure: CYSTOSCOPY WITH BIOPSY;  Surgeon: Marcine Matar, MD;  Location: [facility];  Service: Urology;  Laterality: N/A;   CYSTOSCOPY WITH BIOPSY N/A 06/24/2022   Procedure: CYSTOSCOPY WITH  BIOPSY;  Surgeon: Marcine Matar, MD;  Location: [facility];  Service: Urology;  Laterality: N/A;   EYE SURGERY Right 1998 & 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  ALLERGIES:  has No Known Allergies.  MEDICATIONS:  Current Outpatient Medications  Medication Sig Dispense Refill   acetaminophen (TYLENOL) 500 MG tablet Take 1,000 mg by mouth every 8 (eight) hours as needed for moderate pain.     amLODipine (NORVASC) 10 MG tablet Take 10 mg by mouth daily.     Aspirin-Salicylamide-Caffeine (ARTHRITIS STRENGTH BC POWDER PO) Take 1 packet by mouth daily as needed (pain).     atorvastatin (LIPITOR) 20 MG tablet Take 20 mg by mouth at bedtime.      carteolol (OCUPRESS) 1 % ophthalmic solution Place 1 drop into the right eye every morning.     cetirizine (ZYRTEC) 10 MG tablet Take 10 mg by mouth daily as needed for allergies.     clotrimazole-betamethasone (LOTRISONE) lotion Apply 1 Application topically daily as needed (irritation).     fluticasone (FLONASE) 50 MCG/ACT nasal spray Place 2 sprays into both nostrils daily as needed for allergies.      glimepiride (AMARYL) 1 MG tablet Take 1 mg by mouth daily with breakfast.     ibuprofen (ADVIL) 200 MG tablet Take 600 mg by mouth every 8 (eight) hours as needed for moderate pain.     latanoprost (XALATAN) 0.005 % ophthalmic solution Place 1 drop into the right eye at bedtime.      levothyroxine (SYNTHROID, LEVOTHROID) 100 MCG tablet Take 100 mcg by mouth daily before breakfast.      lidocaine-prilocaine (EMLA) cream Apply 1 Application topically as needed. 30 g 0   losartan (COZAAR) 100 MG tablet Take 100 mg by mouth at bedtime.      meloxicam (MOBIC) 15 MG tablet Take 15 mg by mouth daily.     Misc Natural Products (COMPLETE PROSTATE HEALTH PO) Take 1 tablet by mouth 2 (two) times daily.     omeprazole (PRILOSEC) 20 MG capsule Take 20 mg by mouth daily.     oxybutynin (DITROPAN-XL) 5 MG 24 hr tablet  TAKE 1 TABLET (5 MG TOTAL) BY MOUTH ONCE A WEEK. TAKE 2 HOURS BEFORE SCHEDULED CHEMO IN OUR OFFICE 90 tablet 1   oxycodone (OXY-IR) 5 MG capsule Take 5 mg by mouth every 6 (six) hours as needed for pain.     sitaGLIPtin (JANUVIA) 100 MG tablet Take 100 mg by mouth every morning.     tamsulosin (FLOMAX) 0.4 MG CAPS capsule Take 0.4 mg by mouth every morning.      vitamin B-12 (CYANOCOBALAMIN) 500 MCG tablet Take 500 mcg by mouth daily.     No current facility-administered medications for this visit.   Facility-Administered Medications Ordered in Other Visits  Medication Dose Route Frequency Provider Last Rate Last Admin   sodium chloride flush (NS) 0.9 % injection 10 mL  10 mL Intracatheter PRN Malachy Mood, MD   10 mL at 04/29/23 1514    PHYSICAL EXAMINATION: ECOG PERFORMANCE STATUS: 1 - Symptomatic but completely ambulatory  Vitals:   04/29/23 1320  BP: (!) 145/66  Pulse: 72  Temp: 98.6 F (37 C)   Wt Readings from Last 3 Encounters:  04/29/23 185 lb 3.2 oz (84 kg)  04/08/23 184 lb 8 oz (83.7 kg)  03/31/23 184 lb (83.5 kg)     GENERAL:alert, no distress and comfortable SKIN: skin color normal, no rashes or significant lesions EYES: normal, Conjunctiva are pink and non-injected, sclera clear  NEURO: alert & oriented x 3 with fluent speech LUNGS: (-)clear to auscultation and percussion with normal breathing effort HEART: (-)regular rate & rhythm and no murmurs  and (-)no lower extremity edema   LABORATORY DATA:  I have reviewed the data as listed    Latest Ref Rng & Units 04/29/2023    1:02 PM 04/08/2023    1:56 PM 03/18/2023   12:40 PM  CBC  WBC 4.0 - 10.5 K/uL 5.8  7.0  6.8   Hemoglobin 13.0 - 17.0 g/dL 16.1  09.6  04.5   Hematocrit 39.0 - 52.0 % 36.6  36.3  39.4   Platelets 150 - 400 K/uL 162  171  183         Latest Ref Rng & Units 04/29/2023    1:02 PM 04/08/2023    1:56 PM 03/18/2023   12:40 PM  CMP  Glucose 70 - 99 mg/dL 409  811  914   BUN 8 - 23 mg/dL 14  13  16    Creatinine 0.61 - 1.24 mg/dL 7.82  9.56  2.13   Sodium 135 - 145 mmol/L 139  139  139   Potassium 3.5 - 5.1 mmol/L 4.0  3.9  3.9   Chloride 98 - 111 mmol/L 106  106  106   CO2 22 - 32 mmol/L 28  28  28    Calcium 8.9 - 10.3 mg/dL 9.3  9.4  9.4   Total Protein 6.5 - 8.1 g/dL 7.1  7.0  7.3   Total Bilirubin 0.3 - 1.2 mg/dL 0.4  0.3  0.4   Alkaline Phos 38 - 126 U/L 50  49  51   AST 15 - 41 U/L 17  19  17    ALT 0 - 44 U/L 19  19  19        RADIOGRAPHIC STUDIES: I have personally reviewed the radiological images as listed and agreed with the findings in the report. No results found.    No orders of the defined types were placed in this encounter.  All questions were answered. The patient knows to call  the clinic with any problems, questions or concerns. No barriers to learning was detected. The total time spent in the appointment was 25 minutes.     Malachy Mood, MD 04/29/2023   Carolin Coy, CMA, am acting as scribe for Malachy Mood, MD.   I have reviewed the above documentation for accuracy and completeness, and I agree with the above.

## 2023-04-29 NOTE — Patient Instructions (Signed)
[facility]  Discharge Instructions: Thank you for choosing [facility] to provide your oncology and hematology care.   If you have a lab appointment with the Cancer Center, please go directly to the Cancer Center and check in at the registration area.   Wear comfortable clothing and clothing appropriate for easy access to any Portacath or PICC line.   We strive to give you quality time with your provider. You may need to reschedule your appointment if you arrive late (15 or more minutes).  Arriving late affects you and other patients whose appointments are after yours.  Also, if you miss three or more appointments without notifying the office, you may be dismissed from the clinic at the provider's discretion.      For prescription refill requests, have your pharmacy contact our office and allow 72 hours for refills to be completed.    Today you received the following chemotherapy and/or immunotherapy agent: Pembrolizumab (Keytruda)   To help prevent nausea and vomiting after your treatment, we encourage you to take your nausea medication as directed.  BELOW ARE SYMPTOMS THAT SHOULD BE REPORTED IMMEDIATELY: *FEVER GREATER THAN 100.4 F (38 C) OR HIGHER *CHILLS OR SWEATING *NAUSEA AND VOMITING THAT IS NOT CONTROLLED WITH YOUR NAUSEA MEDICATION *UNUSUAL SHORTNESS OF BREATH *UNUSUAL BRUISING OR BLEEDING *URINARY PROBLEMS (pain or burning when urinating, or frequent urination) *BOWEL PROBLEMS (unusual diarrhea, constipation, pain near the anus) TENDERNESS IN MOUTH AND THROAT WITH OR WITHOUT PRESENCE OF ULCERS (sore throat, sores in mouth, or a toothache) UNUSUAL RASH, SWELLING OR PAIN  UNUSUAL VAGINAL DISCHARGE OR ITCHING   Items with * indicate a potential emergency and should be followed up as soon as possible or go to the Emergency Department if any problems should occur.  Please show the CHEMOTHERAPY ALERT CARD or IMMUNOTHERAPY ALERT  CARD at check-in to the Emergency Department and triage nurse.  Should you have questions after your visit or need to cancel or reschedule your appointment, please contact [facility]  Dept: [phone]  and follow the prompts.  Office hours are 8:00 a.m. to 4:30 p.m. Monday - Friday. Please note that voicemails left after 4:00 p.m. may not be returned until the following business day.  We are closed weekends and major holidays. You have access to a nurse at all times for urgent questions. Please call the main number to the clinic Dept: [phone] and follow the prompts.   For any non-urgent questions, you may also contact your provider using MyChart. We now offer e-Visits for anyone 43 and older to request care online for non-urgent symptoms. For details visit [email or web address].   Also download the MyChart app! Go to the app store, search "MyChart", open the app, select , and log in with your MyChart username and password.

## 2023-04-30 ENCOUNTER — Encounter: Payer: Self-pay | Admitting: Urology

## 2023-05-19 NOTE — Assessment & Plan Note (Addendum)
03/18/2023 - presents for initial treatment with Keytruda. Will be every 3 weeks for next 2 years.

## 2023-05-19 NOTE — Progress Notes (Unsigned)
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 He received Intravesicular gemcitabine with a total of 2000 mg weekly X6 started in February 2022  -bladder biopsies, bladder washings on 06/24/2022 showed focal urothelial carcinoma in situ (CIS) and cytology was positive for high grade urothelial carcinoma -I recommended intravesical gemcitabine 2000 mg weekly x 6, he started on 08/28/16.  He tolerated first cycle poorly with significant hematuria, and intermittent fever for week after treatment. -Due to his poor tolerance to intravesical gemcitabine, I changed therapy to intravesical mitomycin 40 mg every week for 5 weeks. He tolerated the treatment very well  -His surveillance cystoscopy and biopsy were negative for residual cancer -Given his good response to intravesical mitomycin, and his high risk of recurrence, Dr. Retta Diones recommend small intravesical therapy.  Patient agreed to proceed, started in in April 2024. Unfortunately he has had recurrent leakage from the intravesical therapy, and not able to continue. -Treatment was switched to [facility] on March 18, 2023, he tolerated first cycle well.  Plan to continue every 3 weeks for up to 2 years if no recurrence. -He is overall tolerating treatment well, with mild fatigue. He does have some dry skin, he states "all over." He does have some mild, atopic dermatitis noted on the cheek bone areas. He has just started to use Jergen's lotion for diabetics  which seems to help the dryness. no significant toxicity, will continue treatment. -today, he presents for Cycle #4. -Will follow-up with his urologist for cystoscopy monitoring.      Plan: Labs reviewed  -CBC showing WBC 5.8; Hgb 12.2; Hct 35.3; Plt 145; Anc 3.5 -CMP - K 3.9; glucose 135; BUN 13; Creatinine 0.94; eGFR >60; Ca 9.3; LFTs normal.   -thyroid panel from 04/29/2023 was normal. Proceed with Keytruda, Cycle 4 today.  Advised patient to monitor his skin closely and notify us if skin dryness or rash becomes worse or appears infected.  Labs with port flush and follow up and treatment in 3 weeks. Check thyroid panel with labs.   The patient understands the plans discussed today and is in agreement with them.  He knows to contact our office if he develops concerns prior to his next appointment.  I provided 25 minutes of face-to-face time during this encounter and > 50% was spent counseling as documented under my assessment and plan.    Carlean Jews, NP  [place] [facility] [address] [place] [address] Dept: [phone] Dept Fax: [phone]   No orders of the defined types were placed in this encounter.     CHIEF COMPLAINT:  CC: bladder cancer   Current Treatment:  Keytruda every 21 days   INTERVAL HISTORY:  Avan is here today for repeat clinical assessment. He denies fevers or chills. He denies pain. His appetite is good. His weight has been stable.  Presents for Cycle 4 of Keytruda. He presents with his wife. Was last seen by Dr. Mosetta Putt on 04/29/2023. He is tolerating treatment well. Has noted some dry  skin and mild rash on his cheekbones. Started using Jergen's diabetic lotion today. This is helping. States that he is fatigued at times. Does have some trouble with sleeping. This predates treatment with Keytruda. Is not taking anything to help sleep. States that he is up to urinate frequently  during the night. Is not currently taking prescribed oxybutynin. He sees urologist for monitoring cystoscopies.   I have reviewed the past medical history, past surgical history, social history and family history with the patient and they are unchanged from previous note.  ALLERGIES:  has No Known Allergies.  MEDICATIONS:  Current Outpatient Medications  Medication Sig Dispense Refill   acetaminophen (TYLENOL) 500 MG tablet Take 1,000 mg by mouth every 8 (eight) hours as needed for moderate pain.     amLODipine (NORVASC) 10 MG tablet Take 10 mg by mouth daily.     atorvastatin (LIPITOR) 20 MG tablet Take 20 mg by mouth at bedtime.      carteolol (OCUPRESS) 1 % ophthalmic solution Place 1 drop into the right eye every morning.     cetirizine (ZYRTEC) 10 MG tablet Take 10 mg by mouth daily as needed for allergies.     clotrimazole-betamethasone (LOTRISONE) lotion Apply 1 Application topically daily as needed (irritation).     fluticasone (FLONASE) 50 MCG/ACT nasal spray Place 2 sprays into both nostrils daily as needed for allergies.      glimepiride (AMARYL) 1 MG tablet Take 1 mg by mouth daily with breakfast.     ibuprofen (ADVIL) 200 MG tablet Take 600 mg by mouth every 8 (eight) hours as needed for moderate pain.     latanoprost (XALATAN) 0.005 % ophthalmic solution Place 1 drop into the right eye at bedtime.     levothyroxine (SYNTHROID, LEVOTHROID) 100 MCG tablet Take 100 mcg by mouth daily before breakfast.      lidocaine-prilocaine (EMLA) cream Apply 1 Application topically as needed. 30 g 0   losartan (COZAAR) 100 MG tablet Take 100 mg by mouth at bedtime.      meloxicam (MOBIC) 15 MG tablet Take 15 mg by mouth daily.     Misc Natural Products (COMPLETE PROSTATE HEALTH PO) Take 1 tablet by mouth 2 (two) times daily.     omeprazole (PRILOSEC) 20 MG capsule Take 20 mg by mouth daily.     oxycodone (OXY-IR) 5 MG capsule Take 5 mg by mouth every 6 (six) hours as needed for pain.      sitaGLIPtin (JANUVIA) 100 MG tablet Take 100 mg by mouth every morning.     tamsulosin (FLOMAX) 0.4 MG CAPS capsule Take 0.4 mg by mouth every morning.      vitamin B-12 (CYANOCOBALAMIN) 500 MCG tablet Take 500 mcg by mouth daily.     oxybutynin (DITROPAN-XL) 5 MG 24 hr tablet TAKE 1 TABLET (5 MG TOTAL) BY MOUTH ONCE A WEEK. TAKE 2 HOURS BEFORE SCHEDULED CHEMO IN OUR OFFICE (Patient not taking: Reported on 05/20/2023) 90 tablet 1   No current facility-administered medications for this visit.   Facility-Administered Medications Ordered in Other Visits  Medication Dose Route Frequency Provider Last Rate Last Admin   heparin lock flush 100 unit/mL  500 Units Intracatheter Once PRN Malachy Mood, MD       pembrolizumab [facility]) 200 mg in sodium chloride 0.9 % 50 mL chemo infusion  200 mg Intravenous Once Malachy Mood, MD       sodium chloride flush (NS) 0.9 % injection 10 mL  10 mL Intracatheter PRN  Malachy Mood, MD        HISTORY OF PRESENT ILLNESS:   Oncology History Overview Note   Cancer Staging  Bladder cancer [facility]) Staging form: Urinary Bladder, AJCC 8th Edition - Clinical: Stage I (cT1, cN0, cM0) - Signed by Benjiman Core, MD on 09/12/2020 WHO/ISUP grade (low/high): High Grade Histologic grading system: 2 grade system - Pathologic stage from 06/24/2022: Stage 0is (pTis, pN0, cM0) - Signed by Malachy Mood, MD on 08/24/2022 Stage prefix: Initial diagnosis WHO/ISUP grade (low/high): High Grade Histologic grading system: 2 grade system     Bladder cancer (HCC)  09/12/2020 Initial Diagnosis   Bladder cancer (HCC)   09/12/2020 Cancer Staging   Staging form: Urinary Bladder, AJCC 8th Edition - Clinical: Stage I (cT1, cN0, cM0) - Signed by Benjiman Core, MD on 09/12/2020 WHO/ISUP grade (low/high): High Grade Histologic grading system: 2 grade system   06/24/2022 Pathology Results             FINAL MICROSCOPIC DIAGNOSIS:  A. BLADDER, BIOPSY: Focal urothelial carcinoma in situ (CIS)      06/24/2022 Cancer Staging   Staging form: Urinary Bladder, AJCC 8th Edition - Pathologic stage from 06/24/2022: Stage 0is (pTis, pN0, cM0) - Signed by Malachy Mood, MD on 08/24/2022 Stage prefix: Initial diagnosis WHO/ISUP grade (low/high): High Grade Histologic grading system: 2 grade system   08/28/2022 - 08/28/2022 Chemotherapy   Patient is on Treatment Plan : BLADDER Gemcitabine INTRAVESICAL (2000) q7d     09/12/2022 - 02/26/2023 Chemotherapy   Patient is on Treatment Plan : BLADDER Mitomycin INTRAVESICAL (40 mg) q7d     10/25/2022 Genetic Testing   Negative [facility] CancerNext-Expanded +RNA Panel.  Report date is 10/25/2022.   The CancerNext-Expanded gene panel offered by [facility] and includes sequencing, rearrangement, and RNA analysis for the following 77 genes: AIP, ALK, APC, ATM, AXIN2, BAP1, BARD1, BLM, BMPR1A, BRCA1, BRCA2, BRIP1, CDC73, CDH1, CDK4, CDKN1B, CDKN2A, CHEK2, CTNNA1, DICER1, FANCC, FH, FLCN, GALNT12, KIF1B, LZTR1, MAX, MEN1, MET, MLH1, MSH2, MSH3, MSH6, MUTYH, NBN, NF1, NF2, NTHL1, PALB2, PHOX2B, PMS2, POT1, PRKAR1A, PTCH1, PTEN, RAD51C, RAD51D, RB1, RECQL, RET, SDHA, SDHAF2, SDHB, SDHC, SDHD, SMAD4, SMARCA4, SMARCB1, SMARCE1, STK11, SUFU, TMEM127, TP53, TSC1, TSC2, VHL and XRCC2 (sequencing and deletion/duplication); EGFR, EGLN1, HOXB13, KIT, MITF, PDGFRA, POLD1, and POLE (sequencing only); EPCAM and GREM1 (deletion/duplication only).    03/18/2023 -  Chemotherapy   Patient is on Treatment Plan : BLADDER Pembrolizumab (200) q21d     Malignant neoplasm of trigone of urinary bladder (HCC)  09/12/2020 Initial Diagnosis   Malignant neoplasm of trigone of urinary bladder (HCC)   09/26/2020 - 10/31/2020 Chemotherapy   Patient is on Treatment Plan : BLADDER Gemcitabine q7d         REVIEW OF SYSTEMS:   Constitutional: Denies fevers, chills or abnormal weight loss Eyes: Denies blurriness of vision Ears, nose, mouth, throat, and face: Denies mucositis or sore  throat Respiratory: Denies cough, dyspnea or wheezes Cardiovascular: Denies palpitation, chest discomfort or lower extremity swelling Gastrointestinal:  Denies nausea, heartburn or change in bowel habits Skin: Denies abnormal skin rashes. Has noted generalized dry skin.  Lymphatics: Denies new lymphadenopathy or easy bruising Neurological:Denies numbness, tingling or new weaknesses Behavioral/Psych: Mood is stable, no new changes. Has difficulty sleeping Urinary: urinary frequency which does bother him at night more than during the day.  All other systems were reviewed with the patient and are negative.   VITALS:   Today's Vitals   05/20/23 1250 05/20/23 1300  BP: (!) 140/72   Pulse: 69   Resp: 19   Temp: (!) 97.5 F (36.4 C)   TempSrc: Tympanic   SpO2: 99%   Weight: 186 lb 3 oz (84.5 kg)   PainSc:  0-No pain   Body mass index is 26.72 kg/m.   Wt Readings from Last 3 Encounters:  05/20/23 186 lb 3 oz (84.5 kg)  04/29/23 185 lb 3.2 oz (84 kg)  04/08/23 184 lb 8 oz (83.7 kg)    Body mass index is 26.72 kg/m.  Performance status (ECOG): 1 - Symptomatic but completely ambulatory  PHYSICAL EXAM:   GENERAL:alert, no distress and comfortable SKIN: skin color, texture, turgor are normal. Very mild atopic dermatitis noted on his cheekbone areas. Skin intact.  EYES: normal, Conjunctiva are pink and non-injected, sclera clear OROPHARYNX:no exudate, no erythema and lips, buccal mucosa, and tongue normal  NECK: supple, thyroid normal size, non-tender, without nodularity LYMPH:  no palpable lymphadenopathy in the cervical, axillary or inguinal LUNGS: clear to auscultation and percussion with normal breathing effort HEART: regular rate & rhythm and no murmurs and no lower extremity edema ABDOMEN:abdomen soft, non-tender and normal bowel sounds Musculoskeletal:no cyanosis of digits and no clubbing  NEURO: alert & oriented x 3 with fluent speech, no focal motor/sensory  deficits  LABORATORY DATA:  I have reviewed the data as listed    Component Value Date/Time   NA 138 05/20/2023 1230   K 3.9 05/20/2023 1230   CL 106 05/20/2023 1230   CO2 28 05/20/2023 1230   GLUCOSE 155 (H) 05/20/2023 1230   BUN 15 05/20/2023 1230   CREATININE 0.94 05/20/2023 1230   CALCIUM 9.3 05/20/2023 1230   PROT 6.9 05/20/2023 1230   ALBUMIN 4.2 05/20/2023 1230   AST 16 05/20/2023 1230   ALT 16 05/20/2023 1230   ALKPHOS 50 05/20/2023 1230   BILITOT 0.4 05/20/2023 1230   GFRNONAA >60 05/20/2023 1230   GFRAA >60 05/11/2020 1300     Lab Results  Component Value Date   WBC 5.8 05/20/2023   NEUTROABS 3.5 05/20/2023   HGB 12.2 (L) 05/20/2023   HCT 35.3 (L) 05/20/2023   MCV 90.1 05/20/2023   PLT 145 (L) 05/20/2023

## 2023-05-20 ENCOUNTER — Inpatient Hospital Stay: Payer: Medicare HMO | Attending: Hematology | Admitting: Nurse Practitioner

## 2023-05-20 ENCOUNTER — Inpatient Hospital Stay: Payer: Medicare HMO | Attending: Hematology

## 2023-05-20 ENCOUNTER — Inpatient Hospital Stay: Payer: Medicare HMO

## 2023-05-20 VITALS — BP 140/72 | HR 69 | Temp 97.5°F | Resp 19 | Wt 186.2 lb

## 2023-05-20 VITALS — BP 157/65 | HR 64 | Resp 18

## 2023-05-20 DIAGNOSIS — Z5112 Encounter for antineoplastic immunotherapy: Secondary | ICD-10-CM | POA: Diagnosis not present

## 2023-05-20 DIAGNOSIS — C670 Malignant neoplasm of trigone of bladder: Secondary | ICD-10-CM | POA: Insufficient documentation

## 2023-05-20 DIAGNOSIS — Z79620 Long term (current) use of immunosuppressive biologic: Secondary | ICD-10-CM | POA: Insufficient documentation

## 2023-05-20 DIAGNOSIS — C671 Malignant neoplasm of dome of bladder: Secondary | ICD-10-CM

## 2023-05-20 DIAGNOSIS — Z95828 Presence of other vascular implants and grafts: Secondary | ICD-10-CM

## 2023-05-20 DIAGNOSIS — Z79899 Other long term (current) drug therapy: Secondary | ICD-10-CM | POA: Insufficient documentation

## 2023-05-20 LAB — CMP (CANCER CENTER ONLY)
ALT: 16 U/L (ref 0–44)
AST: 16 U/L (ref 15–41)
Albumin: 4.2 g/dL (ref 3.5–5.0)
Alkaline Phosphatase: 50 U/L (ref 38–126)
Anion gap: 4 — ABNORMAL LOW (ref 5–15)
BUN: 15 mg/dL (ref 8–23)
CO2: 28 mmol/L (ref 22–32)
Calcium: 9.3 mg/dL (ref 8.9–10.3)
Chloride: 106 mmol/L (ref 98–111)
Creatinine: 0.94 mg/dL (ref 0.61–1.24)
GFR, Estimated: >60 mL/min (ref >60)
Glucose, Bld: 155 mg/dL — ABNORMAL HIGH (ref 70–99)
Potassium: 3.9 mmol/L (ref 3.5–5.1)
Sodium: 138 mmol/L (ref 135–145)
Total Bilirubin: 0.4 mg/dL (ref 0.3–1.2)
Total Protein: 6.9 g/dL (ref 6.5–8.1)

## 2023-05-20 LAB — CBC WITH DIFFERENTIAL (CANCER CENTER ONLY)
Abs Immature Granulocytes: 0.01 10*3/uL (ref 0.00–0.07)
Basophils Absolute: 0 10*3/uL (ref 0.0–0.1)
Basophils Relative: 1 %
Eosinophils Absolute: 0.2 10*3/uL (ref 0.0–0.5)
Eosinophils Relative: 4 %
HCT: 35.3 % — ABNORMAL LOW (ref 39.0–52.0)
Hemoglobin: 12.2 g/dL — ABNORMAL LOW (ref 13.0–17.0)
Immature Granulocytes: 0 %
Lymphocytes Relative: 25 %
Lymphs Abs: 1.5 10*3/uL (ref 0.7–4.0)
MCH: 31.1 pg (ref 26.0–34.0)
MCHC: 34.6 g/dL (ref 30.0–36.0)
MCV: 90.1 fL (ref 80.0–100.0)
Monocytes Absolute: 0.6 10*3/uL (ref 0.1–1.0)
Monocytes Relative: 10 %
Neutro Abs: 3.5 10*3/uL (ref 1.7–7.7)
Neutrophils Relative %: 60 %
Platelet Count: 145 10*3/uL — ABNORMAL LOW (ref 150–400)
RBC: 3.92 MIL/uL — ABNORMAL LOW (ref 4.22–5.81)
RDW: 12.6 % (ref 11.5–15.5)
WBC Count: 5.8 10*3/uL (ref 4.0–10.5)
nRBC: 0 % (ref 0.0–0.2)

## 2023-05-20 MED ORDER — HEPARIN SOD (PORK) LOCK FLUSH 100 UNIT/ML IV SOLN
500.0000 [IU] | Freq: Once | INTRAVENOUS | Status: AC | PRN
  Administered 2023-05-20: 500 [IU]

## 2023-05-20 MED ORDER — SODIUM CHLORIDE 0.9 % IV SOLN: Freq: Once | INTRAVENOUS | Status: AC

## 2023-05-20 MED ORDER — SODIUM CHLORIDE 0.9 % IV SOLN
200.0000 mg | Freq: Once | INTRAVENOUS | Status: DC
  Filled 2023-05-20: qty 8

## 2023-05-20 MED ORDER — SODIUM CHLORIDE 0.9 % IV SOLN
200.0000 mg | Freq: Once | INTRAVENOUS | Status: AC
  Administered 2023-05-20: 200 mg via INTRAVENOUS
  Filled 2023-05-20: qty 8

## 2023-05-20 MED ORDER — SODIUM CHLORIDE 0.9% FLUSH
10.0000 mL | INTRAVENOUS | Status: DC | PRN
  Administered 2023-05-20: 10 mL

## 2023-05-20 NOTE — Patient Instructions (Signed)

## 2023-05-21 ENCOUNTER — Encounter: Payer: Self-pay | Admitting: Hematology

## 2023-06-03 DIAGNOSIS — C678 Malignant neoplasm of overlapping sites of bladder: Secondary | ICD-10-CM | POA: Diagnosis not present

## 2023-06-03 DIAGNOSIS — R3915 Urgency of urination: Secondary | ICD-10-CM | POA: Diagnosis not present

## 2023-06-09 NOTE — Assessment & Plan Note (Signed)
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 He received Intravesicular gemcitabine with a total of 2000 mg weekly X6 started in February 2022  -bladder biopsies, bladder washings on 06/24/2022 showed focal urothelial carcinoma in situ (CIS) and cytology was positive for high grade urothelial carcinoma -I recommended intravesical gemcitabine 2000 mg weekly x 6, he started on 08/28/16.  He tolerated first cycle poorly with significant hematuria, and intermittent fever for week after treatment. -Due to his poor tolerance to intravesical gemcitabine, I changed therapy to intravesical mitomycin 40 mg every week for 5 weeks. He tolerated the treatment very well  -His surveillance cystoscopy and biopsy were negative for residual cancer -Given his good response to intravesical mitomycin, and his high risk of recurrence, Dr. Retta Diones recommend small intravesical therapy.  Patient agrees to proceed, started in in April 2024. Unfortunately he has had recurrent leakage from the intravesical therapy, and not able to continue. -Treatment was switched to [facility] on March 18, 2023, he tolerated first cycle well.  Plan to continue every 3 weeks for up to 2 years if no recurrence.

## 2023-06-10 ENCOUNTER — Other Ambulatory Visit: Payer: Self-pay

## 2023-06-10 ENCOUNTER — Inpatient Hospital Stay: Payer: Medicare HMO | Admitting: Hematology

## 2023-06-10 ENCOUNTER — Inpatient Hospital Stay: Payer: Medicare HMO

## 2023-06-10 ENCOUNTER — Encounter: Payer: Self-pay | Admitting: Hematology

## 2023-06-10 VITALS — BP 150/63 | HR 64 | Resp 18

## 2023-06-10 VITALS — BP 144/63 | HR 73 | Temp 98.2°F | Resp 20 | Wt 182.2 lb

## 2023-06-10 DIAGNOSIS — C679 Malignant neoplasm of bladder, unspecified: Secondary | ICD-10-CM | POA: Diagnosis not present

## 2023-06-10 DIAGNOSIS — R5383 Other fatigue: Secondary | ICD-10-CM | POA: Diagnosis not present

## 2023-06-10 DIAGNOSIS — Z79620 Long term (current) use of immunosuppressive biologic: Secondary | ICD-10-CM | POA: Diagnosis not present

## 2023-06-10 DIAGNOSIS — C671 Malignant neoplasm of dome of bladder: Secondary | ICD-10-CM

## 2023-06-10 DIAGNOSIS — Z79899 Other long term (current) drug therapy: Secondary | ICD-10-CM | POA: Diagnosis not present

## 2023-06-10 DIAGNOSIS — Z95828 Presence of other vascular implants and grafts: Secondary | ICD-10-CM

## 2023-06-10 DIAGNOSIS — Z5112 Encounter for antineoplastic immunotherapy: Secondary | ICD-10-CM | POA: Diagnosis not present

## 2023-06-10 DIAGNOSIS — C670 Malignant neoplasm of trigone of bladder: Secondary | ICD-10-CM | POA: Diagnosis not present

## 2023-06-10 LAB — CMP (CANCER CENTER ONLY)
ALT: 15 U/L (ref 0–44)
AST: 15 U/L (ref 15–41)
Albumin: 4.4 g/dL (ref 3.5–5.0)
Alkaline Phosphatase: 53 U/L (ref 38–126)
Anion gap: 5 (ref 5–15)
BUN: 11 mg/dL (ref 8–23)
CO2: 28 mmol/L (ref 22–32)
Calcium: 9.5 mg/dL (ref 8.9–10.3)
Chloride: 106 mmol/L (ref 98–111)
Creatinine: 0.93 mg/dL (ref 0.61–1.24)
GFR, Estimated: >60 mL/min (ref >60)
Glucose, Bld: 163 mg/dL — ABNORMAL HIGH (ref 70–99)
Potassium: 4 mmol/L (ref 3.5–5.1)
Sodium: 139 mmol/L (ref 135–145)
Total Bilirubin: 0.4 mg/dL (ref 0.3–1.2)
Total Protein: 7.2 g/dL (ref 6.5–8.1)

## 2023-06-10 LAB — CBC WITH DIFFERENTIAL (CANCER CENTER ONLY)
Abs Immature Granulocytes: 0.03 10*3/uL (ref 0.00–0.07)
Basophils Absolute: 0.1 10*3/uL (ref 0.0–0.1)
Basophils Relative: 1 %
Eosinophils Absolute: 0.4 10*3/uL (ref 0.0–0.5)
Eosinophils Relative: 7 %
HCT: 39.1 % (ref 39.0–52.0)
Hemoglobin: 13 g/dL (ref 13.0–17.0)
Immature Granulocytes: 1 %
Lymphocytes Relative: 23 %
Lymphs Abs: 1.5 10*3/uL (ref 0.7–4.0)
MCH: 30.1 pg (ref 26.0–34.0)
MCHC: 33.2 g/dL (ref 30.0–36.0)
MCV: 90.5 fL (ref 80.0–100.0)
Monocytes Absolute: 0.6 10*3/uL (ref 0.1–1.0)
Monocytes Relative: 9 %
Neutro Abs: 3.7 10*3/uL (ref 1.7–7.7)
Neutrophils Relative %: 59 %
Platelet Count: 173 10*3/uL (ref 150–400)
RBC: 4.32 MIL/uL (ref 4.22–5.81)
RDW: 12.4 % (ref 11.5–15.5)
WBC Count: 6.2 10*3/uL (ref 4.0–10.5)
nRBC: 0 % (ref 0.0–0.2)

## 2023-06-10 LAB — T4, FREE: Free T4: 1.11 ng/dL (ref 0.61–1.12)

## 2023-06-10 LAB — TSH: TSH: 1.445 u[IU]/mL (ref 0.350–4.500)

## 2023-06-10 MED ORDER — ALTEPLASE 2 MG IJ SOLR
2.0000 mg | Freq: Once | INTRAMUSCULAR | Status: AC | PRN
  Administered 2023-06-10: 2 mg
  Filled 2023-06-10: qty 2

## 2023-06-10 MED ORDER — HEPARIN SOD (PORK) LOCK FLUSH 100 UNIT/ML IV SOLN
500.0000 [IU] | Freq: Once | INTRAVENOUS | Status: AC | PRN
  Administered 2023-06-10: 500 [IU]

## 2023-06-10 MED ORDER — SODIUM CHLORIDE 0.9 % IV SOLN
Freq: Once | INTRAVENOUS | Status: AC
Start: 2023-06-10 — End: 2023-06-10

## 2023-06-10 MED ORDER — SODIUM CHLORIDE 0.9 % IV SOLN
200.0000 mg | Freq: Once | INTRAVENOUS | Status: AC
Start: 2023-06-10 — End: 2023-06-10
  Administered 2023-06-10: 200 mg via INTRAVENOUS
  Filled 2023-06-10: qty 200

## 2023-06-10 MED ORDER — SODIUM CHLORIDE 0.9% FLUSH
10.0000 mL | INTRAVENOUS | Status: DC | PRN
  Administered 2023-06-10: 10 mL

## 2023-06-10 NOTE — Patient Instructions (Signed)
[name]  Discharge Instructions: Thank you for choosing [facility] to provide your oncology and hematology care.   If you have a lab appointment with the [facility], please go directly to the [facility] and check in at the registration area.   Wear comfortable clothing and clothing appropriate for easy access to any Portacath or PICC line.   We strive to give you quality time with your provider. You may need to reschedule your appointment if you arrive late (15 or more minutes).  Arriving late affects you and other patients whose appointments are after yours.  Also, if you miss three or more appointments without notifying the office, you may be dismissed from the clinic at the provider's discretion.      For prescription refill requests, have your pharmacy contact our office and allow 72 hours for refills to be completed.    Today you received the following chemotherapy and/or immunotherapy agents: Keytruda      To help prevent nausea and vomiting after your treatment, we encourage you to take your nausea medication as directed.  BELOW ARE SYMPTOMS THAT SHOULD BE REPORTED IMMEDIATELY: *FEVER GREATER THAN 100.4 F (38 C) OR HIGHER *CHILLS OR SWEATING *NAUSEA AND VOMITING THAT IS NOT CONTROLLED WITH YOUR NAUSEA MEDICATION *UNUSUAL SHORTNESS OF BREATH *UNUSUAL BRUISING OR BLEEDING *URINARY PROBLEMS (pain or burning when urinating, or frequent urination) *BOWEL PROBLEMS (unusual diarrhea, constipation, pain near the anus) TENDERNESS IN MOUTH AND THROAT WITH OR WITHOUT PRESENCE OF ULCERS (sore throat, sores in mouth, or a toothache) UNUSUAL RASH, SWELLING OR PAIN  UNUSUAL VAGINAL DISCHARGE OR ITCHING   Items with * indicate a potential emergency and should be followed up as soon as possible or go to the Emergency Department if any problems should occur.  Please show the CHEMOTHERAPY ALERT CARD or IMMUNOTHERAPY ALERT CARD at  check-in to the Emergency Department and triage nurse.  Should you have questions after your visit or need to cancel or reschedule your appointment, please contact [facility]  Dept: [phone]  and follow the prompts.  Office hours are 8:00 a.m. to 4:30 p.m. Monday - Friday. Please note that voicemails left after 4:00 p.m. may not be returned until the following business day.  We are closed weekends and major holidays. You have access to a nurse at all times for urgent questions. Please call the main number to the clinic Dept: [phone] and follow the prompts.   For any non-urgent questions, you may also contact your provider using MyChart. We now offer e-Visits for anyone 14 and older to request care online for non-urgent symptoms. For details visit [email or web address].   Also download the MyChart app! Go to the app store, search "MyChart", open the app, select [facility], and log in with your MyChart username and password.

## 2023-06-10 NOTE — Progress Notes (Signed)
[facility]   [phone] [phone]   Clinic Follow up Note   Patient Care Team: Charlane Ferretti, DO as PCP - General (Internal Medicine) Malachy Mood, MD as Attending Physician (Hematology and Oncology) Marcine Matar, MD as Consulting Physician (Urology)  Date of Service:  06/10/2023  CHIEF COMPLAINT: f/u of recurrent bladder cancer  CURRENT THERAPY:  Keytruda every 3 weeks  Oncology History   Bladder cancer [facility]) -initially diagnosed in 2017  -He was found to have high-grade urothelial carcinoma without muscle invasion and developed and subsequently BCG refractory disease in December 2021. He received Intravesicular gemcitabine with a total of 2000 mg weekly X6 started in February 2022  -bladder biopsies, bladder washings on 06/24/2022 showed focal urothelial carcinoma in situ (CIS) and cytology was positive for high grade urothelial carcinoma -I recommended intravesical gemcitabine 2000 mg weekly x 6, he started on 08/28/16.  He tolerated first cycle poorly with significant hematuria, and intermittent fever for week after treatment. -Due to his poor tolerance to intravesical gemcitabine, I changed therapy to intravesical mitomycin 40 mg every week for 5 weeks. He tolerated the treatment very well  -His surveillance cystoscopy and biopsy were negative for residual cancer -Given his good response to intravesical mitomycin, and his high risk of recurrence, Dr. Retta Diones recommend small intravesical therapy.  Patient agrees to proceed, started in in April 2024. Unfortunately he has had recurrent leakage from the intravesical therapy, and not able to continue. -Treatment was switched to [facility] on March 18, 2023, he tolerated first cycle well.  Plan to continue every 3 weeks for up to 2 years if no recurrence.   Assessment and Plan    Recurrent bladder cancer Patient is on immunotherapy with Keytruda. No reported issues from the last treatment.  Port had issues today, but was treated with medication to clear blockage. -Continue Keytruda as scheduled.  Skin Rash Patient reports skin flaking and itching, primarily on legs and back. Likely side effect of Keytruda. -Continue use of cream for symptom management.  Fatigue Patient reports feeling tired and lazy most of the time. This could be a side effect of Keytruda or possibly related to thyroid function.  Patient has history of hypothyroidism, on levothyroxine. -Check thyroid function today.   Follow-up -Lab reviewed, adequate for treatment, will proceed Keytruda today  -Will check TSH and free T4 today  next Keytruda infusion scheduled for November 19th.   SUMMARY OF ONCOLOGIC HISTORY: Oncology History Overview Note   Cancer Staging  Bladder cancer [facility]) Staging form: Urinary Bladder, AJCC 8th Edition - Clinical: Stage I (cT1, cN0, cM0) - Signed by Benjiman Core, MD on 09/12/2020 WHO/ISUP grade (low/high): High Grade Histologic grading system: 2 grade system - Pathologic stage from 06/24/2022: Stage 0is (pTis, pN0, cM0) - Signed by Malachy Mood, MD on 08/24/2022 Stage prefix: Initial diagnosis WHO/ISUP grade (low/high): High Grade Histologic grading system: 2 grade system     Bladder cancer (HCC)  09/12/2020 Initial Diagnosis   Bladder cancer (HCC)   09/12/2020 Cancer Staging   Staging form: Urinary Bladder, AJCC 8th Edition - Clinical: Stage I (cT1, cN0, cM0) - Signed by Benjiman Core, MD on 09/12/2020 WHO/ISUP grade (low/high): High Grade Histologic grading system: 2 grade system   06/24/2022 Pathology Results             FINAL MICROSCOPIC DIAGNOSIS:  A. BLADDER, BIOPSY: Focal urothelial carcinoma in situ (CIS)     06/24/2022 Cancer Staging   Staging form: Urinary Bladder, AJCC  8th Edition - Pathologic stage from 06/24/2022: Stage 0is (pTis, pN0, cM0) - Signed by Malachy Mood, MD on 08/24/2022 Stage prefix: Initial diagnosis WHO/ISUP grade (low/high): High  Grade Histologic grading system: 2 grade system   08/28/2022 - 08/28/2022 Chemotherapy   Patient is on Treatment Plan : BLADDER Gemcitabine INTRAVESICAL (2000) q7d     09/12/2022 - 02/26/2023 Chemotherapy   Patient is on Treatment Plan : BLADDER Mitomycin INTRAVESICAL (40 mg) q7d     10/25/2022 Genetic Testing   Negative [facility] CancerNext-Expanded +RNA Panel.  Report date is 10/25/2022.   The CancerNext-Expanded gene panel offered by [facility] and includes sequencing, rearrangement, and RNA analysis for the following 77 genes: AIP, ALK, APC, ATM, AXIN2, BAP1, BARD1, BLM, BMPR1A, BRCA1, BRCA2, BRIP1, CDC73, CDH1, CDK4, CDKN1B, CDKN2A, CHEK2, CTNNA1, DICER1, FANCC, FH, FLCN, GALNT12, KIF1B, LZTR1, MAX, MEN1, MET, MLH1, MSH2, MSH3, MSH6, MUTYH, NBN, NF1, NF2, NTHL1, PALB2, PHOX2B, PMS2, POT1, PRKAR1A, PTCH1, PTEN, RAD51C, RAD51D, RB1, RECQL, RET, SDHA, SDHAF2, SDHB, SDHC, SDHD, SMAD4, SMARCA4, SMARCB1, SMARCE1, STK11, SUFU, TMEM127, TP53, TSC1, TSC2, VHL and XRCC2 (sequencing and deletion/duplication); EGFR, EGLN1, HOXB13, KIT, MITF, PDGFRA, POLD1, and POLE (sequencing only); EPCAM and GREM1 (deletion/duplication only).    03/18/2023 -  Chemotherapy   Patient is on Treatment Plan : BLADDER Pembrolizumab (200) q21d     Malignant neoplasm of trigone of urinary bladder (HCC)  09/12/2020 Initial Diagnosis   Malignant neoplasm of trigone of urinary bladder (HCC)   09/26/2020 - 10/31/2020 Chemotherapy   Patient is on Treatment Plan : BLADDER Gemcitabine q7d        Discussed the use of AI scribe software for clinical note transcription with the patient, who gave verbal consent to proceed.  History of Present Illness   The patient, an 82 year old male with a history of recurrent blood cancer, presents for a follow-up visit. He is currently on immunotherapy and Keytruda. The patient reports no issues from the last treatment, but there was a problem with his port today, which is expected to be resolved with  medication. The patient's skin has been flaking and itching, particularly on his legs and back. This is managed with cream application. The patient also reports feeling tired and lazy most of the time, which could be a side effect of the treatment. The patient's thyroid function has not been checked since September, and he reports increased sensitivity to cold, needing extra blankets at night and while watching TV. The patient had a recent cystoscopy, which showed no issues.         All other systems were reviewed with the patient and are negative.  MEDICAL HISTORY:  Past Medical History:  Diagnosis Date   Bladder cancer (HCC)    Blind right eye    BPH (benign prostatic hypertrophy)    Cataracts, both eyes    Depression    DM type 2 (diabetes mellitus, type 2) (HCC)    DOE (dyspnea on exertion)    with heavy exertion   Full dentures    GERD (gastroesophageal reflux disease)    Glaucoma no peripheral or side vision   right eye blind   Glaucoma, right eye    HOH (hard of hearing)    Hyperlipidemia    Hypertension    Hypothyroidism    Local recurrence of cancer of urinary bladder [facility]) urologsit-  dr Retta Diones   1st dx 01-29-2016--  now recurrent 05-31-2016   Lower extremity edema 07/11/2020   right swells more than left goes down at night  after sleeping   Lower urinary tract symptoms (LUTS) 2019   Migraine    Peripheral neuropathy    middle finger left hand   Pneumonia 2019   Skin abnormality    base of spine looks like pimple has had since 07-01-2020, no drainage area intact   Wears glasses     SURGICAL HISTORY: Past Surgical History:  Procedure Laterality Date   ANTERIOR CERVICAL DECOMP/DISCECTOMY FUSION  08-15-2000   C5 -- C6   ANTERIOR CERVICAL DECOMP/DISCECTOMY FUSION  03-04-2008   C6 -- C7   areas removed from back at spine  2021 jan and aug 2021   CYSTOSCOPY W/ RETROGRADES Left 05/11/2020   Procedure: Cystoscopy with left retrograde ureteropyelogram, resection of  probable bladder tumor and left trigonal/ureteral orifice region, 3 cm in size, fluoroscopic interpretation, left ureteroscopy, placement of 6 French by 26 cm contour double-J stent without tether;  Surgeon: Marcine Matar, MD;  Location: [facility];  Service: Urology;  Laterality: Left;   CYSTOSCOPY W/ RETROGRADES Bilateral 06/24/2022   Procedure: CYSTOSCOPY WITH RETROGRADE PYELOGRAM;  Surgeon: Marcine Matar, MD;  Location: [facility];  Service: Urology;  Laterality: Bilateral;  1 HR   CYSTOSCOPY WITH BIOPSY N/A 01/29/2016   Procedure: CYSTOSCOPY WITH BIOPSY;  Surgeon: Marcine Matar, MD;  Location: [facility];  Service: Urology;  Laterality: N/A;   CYSTOSCOPY WITH BIOPSY N/A 05/31/2016   Procedure: CYSTOSCOPY WITH BIOPSY;  Surgeon: Marcine Matar, MD;  Location: [facility];  Service: Urology;  Laterality: N/A;   CYSTOSCOPY WITH BIOPSY N/A 06/24/2022   Procedure: CYSTOSCOPY WITH BIOPSY;  Surgeon: Marcine Matar, MD;  Location: [facility];  Service: Urology;  Laterality: N/A;   EYE SURGERY Right 1998 & 2000   [place]   GLAUCOMA SURGERY  1998 and 2000   IR IMAGING GUIDED PORT INSERTION  03/31/2023   lower back  06/01/2019, 02-10-2020   nose cancer  areas removed  2019, 2021   x 2 / 3/ 2021 small area removed from nose   surgery for sacral fracture  40 yrs ago   TRANSURETHRAL RESECTION OF BLADDER TUMOR N/A 07/17/2020   Procedure: TRANSURETHRAL RESECTION OF BLADDER TUMOR (TURBT);  Surgeon: Marcine Matar, MD;  Location: [facility];  Service: Urology;  Laterality: N/A;    I have reviewed the social history and family history with the patient and they are unchanged from previous note.  ALLERGIES:  has No Known Allergies.  MEDICATIONS:  Current Outpatient Medications  Medication Sig Dispense Refill   acetaminophen (TYLENOL) 500 MG tablet Take 1,000 mg by mouth every 8 (eight) hours as needed for moderate pain.      amLODipine (NORVASC) 10 MG tablet Take 10 mg by mouth daily.     atorvastatin (LIPITOR) 20 MG tablet Take 20 mg by mouth at bedtime.      carteolol (OCUPRESS) 1 % ophthalmic solution Place 1 drop into the right eye every morning.     cetirizine (ZYRTEC) 10 MG tablet Take 10 mg by mouth daily as needed for allergies.     clotrimazole-betamethasone (LOTRISONE) lotion Apply 1 Application topically daily as needed (irritation).     fluticasone (FLONASE) 50 MCG/ACT nasal spray Place 2 sprays into both nostrils daily as needed for allergies.      glimepiride (AMARYL) 1 MG tablet Take 1 mg by mouth daily with breakfast.     ibuprofen (ADVIL) 200 MG tablet Take 600 mg by mouth every 8 (eight) hours as needed for moderate  pain.     latanoprost (XALATAN) 0.005 % ophthalmic solution Place 1 drop into the right eye at bedtime.     levothyroxine (SYNTHROID, LEVOTHROID) 100 MCG tablet Take 100 mcg by mouth daily before breakfast.      lidocaine-prilocaine (EMLA) cream Apply 1 Application topically as needed. 30 g 0   losartan (COZAAR) 100 MG tablet Take 100 mg by mouth at bedtime.      meloxicam (MOBIC) 15 MG tablet Take 15 mg by mouth daily.     Misc Natural Products (COMPLETE PROSTATE HEALTH PO) Take 1 tablet by mouth 2 (two) times daily.     omeprazole (PRILOSEC) 20 MG capsule Take 20 mg by mouth daily.     oxybutynin (DITROPAN-XL) 5 MG 24 hr tablet TAKE 1 TABLET (5 MG TOTAL) BY MOUTH ONCE A WEEK. TAKE 2 HOURS BEFORE SCHEDULED CHEMO IN OUR OFFICE (Patient not taking: Reported on 05/20/2023) 90 tablet 1   oxycodone (OXY-IR) 5 MG capsule Take 5 mg by mouth every 6 (six) hours as needed for pain.     sitaGLIPtin (JANUVIA) 100 MG tablet Take 100 mg by mouth every morning.     tamsulosin (FLOMAX) 0.4 MG CAPS capsule Take 0.4 mg by mouth every morning.      vitamin B-12 (CYANOCOBALAMIN) 500 MCG tablet Take 500 mcg by mouth daily.     No current facility-administered medications for this visit.    Facility-Administered Medications Ordered in Other Visits  Medication Dose Route Frequency Provider Last Rate Last Admin   sodium chloride flush (NS) 0.9 % injection 10 mL  10 mL Intracatheter PRN Malachy Mood, MD   10 mL at 06/10/23 1527    PHYSICAL EXAMINATION: ECOG PERFORMANCE STATUS: 1 - Symptomatic but completely ambulatory  Vitals:   06/10/23 1332  BP: (!) 144/63  Pulse: 73  Resp: 20  Temp: 98.2 F (36.8 C)  SpO2: 98%   Wt Readings from Last 3 Encounters:  06/10/23 182 lb 3.2 oz (82.6 kg)  05/20/23 186 lb 3 oz (84.5 kg)  04/29/23 185 lb 3.2 oz (84 kg)     GENERAL:alert, no distress and comfortable SKIN: skin color, texture, turgor are normal, no rashes or significant lesions EYES: normal, Conjunctiva are pink and non-injected, sclera clear NECK: supple, thyroid normal size, non-tender, without nodularity LYMPH:  no palpable lymphadenopathy in the cervical, axillary  LUNGS: clear to auscultation and percussion with normal breathing effort HEART: regular rate & rhythm and no murmurs and no lower extremity edema ABDOMEN:abdomen soft, non-tender and normal bowel sounds Musculoskeletal:no cyanosis of digits and no clubbing  NEURO: alert & oriented x 3 with fluent speech, no focal motor/sensory deficits    LABORATORY DATA:  I have reviewed the data as listed    Latest Ref Rng & Units 06/10/2023    1:13 PM 05/20/2023   12:30 PM 04/29/2023    1:02 PM  CBC  WBC 4.0 - 10.5 K/uL 6.2  5.8  5.8   Hemoglobin 13.0 - 17.0 g/dL 04.5  40.9  81.1   Hematocrit 39.0 - 52.0 % 39.1  35.3  36.6   Platelets 150 - 400 K/uL 173  145  162         Latest Ref Rng & Units 06/10/2023    1:13 PM 05/20/2023   12:30 PM 04/29/2023    1:02 PM  CMP  Glucose 70 - 99 mg/dL 914  782  956   BUN 8 - 23 mg/dL 11  15  14  Creatinine 0.61 - 1.24 mg/dL 1.61  0.96  0.45   Sodium 135 - 145 mmol/L 139  138  139   Potassium 3.5 - 5.1 mmol/L 4.0  3.9  4.0   Chloride 98 - 111 mmol/L 106  106  106   CO2  22 - 32 mmol/L 28  28  28    Calcium 8.9 - 10.3 mg/dL 9.5  9.3  9.3   Total Protein 6.5 - 8.1 g/dL 7.2  6.9  7.1   Total Bilirubin 0.3 - 1.2 mg/dL 0.4  0.4  0.4   Alkaline Phos 38 - 126 U/L 53  50  50   AST 15 - 41 U/L 15  16  17    ALT 0 - 44 U/L 15  16  19        RADIOGRAPHIC STUDIES: I have personally reviewed the radiological images as listed and agreed with the findings in the report. No results found.    Orders Placed This Encounter  Procedures   CBC with Differential (Cancer Center Only)    Standing Status:   Future    Standing Expiration Date:   06/30/2024   CMP (Cancer Center only)    Standing Status:   Future    Standing Expiration Date:   06/30/2024   T4    Standing Status:   Future    Standing Expiration Date:   06/30/2024   TSH    Standing Status:   Future    Standing Expiration Date:   06/30/2024   CBC with Differential (Cancer Center Only)    Standing Status:   Future    Standing Expiration Date:   07/21/2024   CMP (Cancer Center only)    Standing Status:   Future    Standing Expiration Date:   07/21/2024   CBC with Differential (Cancer Center Only)    Standing Status:   Future    Standing Expiration Date:   08/11/2024   CMP (Cancer Center only)    Standing Status:   Future    Standing Expiration Date:   08/11/2024   CBC with Differential (Cancer Center Only)    Standing Status:   Future    Standing Expiration Date:   09/01/2024   CMP (Cancer Center only)    Standing Status:   Future    Standing Expiration Date:   09/01/2024   T4    Standing Status:   Future    Standing Expiration Date:   09/01/2024   TSH    Standing Status:   Future    Standing Expiration Date:   09/01/2024   TSH    Standing Status:   Standing    Number of Occurrences:   30    Standing Expiration Date:   06/09/2024   T4, free    Standing Status:   Standing    Number of Occurrences:   30    Standing Expiration Date:   06/09/2024   All questions were answered. The patient knows to  call the clinic with any problems, questions or concerns. No barriers to learning was detected. The total time spent in the appointment was 25 minutes.     Malachy Mood, MD 06/10/2023

## 2023-06-12 ENCOUNTER — Other Ambulatory Visit: Payer: Self-pay

## 2023-06-18 ENCOUNTER — Encounter: Payer: Self-pay | Admitting: Hematology

## 2023-06-29 ENCOUNTER — Other Ambulatory Visit: Payer: Self-pay

## 2023-06-29 NOTE — Assessment & Plan Note (Signed)
03/18/2023 - prese-initially diagnosed in 2017  -He was found to have high-grade urothelial carcinoma without muscle invasion and developed and subsequently BCG refractory disease in December 2021. He received Intravesicular gemcitabine with a total of 2000 mg weekly X6 started in February 2022  -bladder biopsies, bladder washings on 06/24/2022 showed focal urothelial carcinoma in situ (CIS) and cytology was positive for high grade urothelial carcinoma -I recommended intravesical gemcitabine 2000 mg weekly x 6, he started on 08/28/16.  He tolerated first cycle poorly with significant hematuria, and intermittent fever for week after treatment. -Due to his poor tolerance to intravesical gemcitabine, I changed therapy to intravesical mitomycin 40 mg every week for 5 weeks. He tolerated the treatment very well  -His surveillance cystoscopy and biopsy were negative for residual cancer -Given his good response to intravesical mitomycin, and his high risk of recurrence, Dr. Retta Diones recommend small intravesical therapy.  Patient agreed to proceed, started in in April 2024. Unfortunately he has had recurrent leakage from the intravesical therapy, and not able to continue. -Treatment was switched to [facility] on March 18, 2023, he tolerated first cycle well.  Plan to continue every 3 weeks for up to 2 years if no recurrence. -He is overall tolerating treatment well, with mild fatigue. He does have some dry skin, he states "all over." He does have some mild, atopic dermatitis noted on the cheek bone areas. He has just started to use Jergen's lotion for diabetics which seems to help the dryness. no significant toxicity, will continue treatment. -today, he presents for Cycle #6 day 1 . -Will follow-up with his urologist for cystoscopy monitoring.

## 2023-06-29 NOTE — Progress Notes (Signed)
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 He received Intravesicular gemcitabine with a total of 2000 mg weekly X6 started in February 2022  -bladder biopsies, bladder washings on 06/24/2022 showed focal urothelial carcinoma in situ (CIS) and cytology was positive for high grade urothelial carcinoma -I recommended intravesical gemcitabine 2000 mg weekly x 6, he started on 08/28/16.  He tolerated first cycle poorly with significant hematuria, and intermittent fever for week after treatment. -Due to his poor tolerance to intravesical gemcitabine, I changed therapy to intravesical mitomycin 40 mg every week for 5 weeks. He tolerated the treatment very well  -His surveillance cystoscopy and biopsy were negative for residual cancer -Given his good response to intravesical mitomycin, and his high risk of recurrence, Dr. Retta Diones recommend small intravesical therapy.  Patient agreed to proceed, started in in April 2024. Unfortunately he has had recurrent leakage from the intravesical therapy, and not able to continue. -Treatment was switched to [facility] on March 18, 2023, he tolerated first cycle well.  Plan to continue every 3 weeks for up to 2 years if no recurrence. -He is overall tolerating treatment well, with mild fatigue. He does have some dry skin, he states "all over." He does have some mild, atopic dermatitis noted on the cheek bone areas. He has just started to use Jergen's lotion for  diabetics which seems to help the dryness. no significant toxicity, will continue treatment. -today, he presents for Cycle #6 day 1 . -Will follow-up with his urologist for cystoscopy monitoring.       Plan: Labs reviewed  -CBC showing WBC 5.3; Hgb 12.4; Hct 36.4; Plt 162; Anc 2.8 -CMP - K 3.8; glucose 113; BUN 12; Creatinine 0.86; eGFR > 60; Ca 9.3; LFTs normal.   He is scheduled to follow-up with urology on 10/31/2023. Proceed with Keytruda cycle 6-day 1 today. Labs/flush, follow-up, and treatment in 3 weeks as scheduled.  The patient understands the plans discussed today and is in agreement with them.  He knows to contact our office if he develops concerns prior to his next appointment.  I provided 25 minutes of face-to-face time during this encounter and > 50% was spent counseling as documented under my assessment and plan.    Carlean Jews, NP  [facility] - A DEPT OF [facility] [address] [place] [address] Dept: [phone] Dept Fax: [phone]   No orders of the defined types were placed in this encounter.     CHIEF COMPLAINT:  CC: bladder cancer   Current Treatment:  Rande Lawman every 3 weeks   INTERVAL HISTORY:  Keith [name] is here today for repeat clinical assessment. Was last seen  by dr. Mosetta Putt on 06/10/2023. Had developed some skin itching and flaking on his back. Has been tolerating well, otherwise. Continues to be followed by urologist. Thyroid panel has  been stable.   He denies fevers or chills. He denies pain. His appetite is good. His weight has been stable.  I have reviewed the past medical history,  past surgical history, social history and family history with the patient and they are unchanged from previous note.  ALLERGIES:  has No Known Allergies.  MEDICATIONS:  Current Outpatient Medications  Medication Sig Dispense Refill   acetaminophen (TYLENOL) 500 MG tablet Take 1,000 mg by  mouth every 8 (eight) hours as needed for moderate pain.     amLODipine (NORVASC) 10 MG tablet Take 10 mg by mouth daily.     atorvastatin (LIPITOR) 20 MG tablet Take 20 mg by mouth at bedtime.      carteolol (OCUPRESS) 1 % ophthalmic solution Place 1 drop into the right eye every morning.     cetirizine (ZYRTEC) 10 MG tablet Take 10 mg by mouth daily as needed for allergies.     clotrimazole-betamethasone (LOTRISONE) lotion Apply 1 Application topically daily as needed (irritation).     fluticasone (FLONASE) 50 MCG/ACT nasal spray Place 2 sprays into both nostrils daily as needed for allergies.      glimepiride (AMARYL) 1 MG tablet Take 1 mg by mouth daily with breakfast.     ibuprofen (ADVIL) 200 MG tablet Take 600 mg by mouth every 8 (eight) hours as needed for moderate pain.     latanoprost (XALATAN) 0.005 % ophthalmic solution Place 1 drop into the right eye at bedtime.     levothyroxine (SYNTHROID, LEVOTHROID) 100 MCG tablet Take 100 mcg by mouth daily before breakfast.      lidocaine-prilocaine (EMLA) cream Apply 1 Application topically as needed. 30 g 0   losartan (COZAAR) 100 MG tablet Take 100 mg by mouth at bedtime.      meloxicam (MOBIC) 15 MG tablet Take 15 mg by mouth daily.     Misc Natural Products (COMPLETE PROSTATE HEALTH PO) Take 1 tablet by mouth 2 (two) times daily.     omeprazole (PRILOSEC) 20 MG capsule Take 20 mg by mouth daily.     oxybutynin (DITROPAN-XL) 5 MG 24 hr tablet TAKE 1 TABLET (5 MG TOTAL) BY MOUTH ONCE A WEEK. TAKE 2 HOURS BEFORE SCHEDULED CHEMO IN OUR OFFICE 90 tablet 1   oxycodone (OXY-IR) 5 MG capsule Take 5 mg by mouth every 6 (six) hours as needed for pain.     sitaGLIPtin (JANUVIA) 100 MG tablet Take 100 mg by mouth every morning.     tamsulosin (FLOMAX) 0.4 MG CAPS capsule Take 0.4 mg by mouth every morning.      vitamin B-12 (CYANOCOBALAMIN) 500 MCG tablet Take 500 mcg by mouth daily.     No current facility-administered medications for this visit.     HISTORY OF PRESENT ILLNESS:   Oncology History Overview Note   Cancer Staging  Bladder cancer [facility]) Staging form: Urinary Bladder, AJCC 8th Edition - Clinical: Stage I (cT1, cN0, cM0) - Signed by Benjiman Core, MD on 09/12/2020 WHO/ISUP grade (low/high): High Grade Histologic grading system: 2 grade system - Pathologic stage from 06/24/2022: Stage 0is (pTis, pN0, cM0) - Signed by Malachy Mood, MD on 08/24/2022 Stage prefix: Initial diagnosis WHO/ISUP grade (low/high): High Grade Histologic grading system: 2 grade system     Bladder cancer (HCC)  09/12/2020 Initial Diagnosis   Bladder cancer (HCC)   09/12/2020 Cancer Staging   Staging form: Urinary Bladder, AJCC 8th Edition - Clinical: Stage I (cT1, cN0, cM0) - Signed by Benjiman Core, MD on 09/12/2020 WHO/ISUP grade (low/high): High Grade Histologic grading system: 2 grade system   06/24/2022 Pathology Results  FINAL MICROSCOPIC DIAGNOSIS:  A. BLADDER, BIOPSY: Focal urothelial carcinoma in situ (CIS)     06/24/2022 Cancer Staging   Staging form: Urinary Bladder, AJCC 8th Edition - Pathologic stage from 06/24/2022: Stage 0is (pTis, pN0, cM0) - Signed by Malachy Mood, MD on 08/24/2022 Stage prefix: Initial diagnosis WHO/ISUP grade (low/high): High Grade Histologic grading system: 2 grade system   08/28/2022 - 08/28/2022 Chemotherapy   Patient is on Treatment Plan : BLADDER Gemcitabine INTRAVESICAL (2000) q7d     09/12/2022 - 02/26/2023 Chemotherapy   Patient is on Treatment Plan : BLADDER Mitomycin INTRAVESICAL (40 mg) q7d     10/25/2022 Genetic Testing   Negative [facility] CancerNext-Expanded +RNA Panel.  Report date is 10/25/2022.   The CancerNext-Expanded gene panel offered by [facility] and includes sequencing, rearrangement, and RNA analysis for the following 77 genes: AIP, ALK, APC, ATM, AXIN2, BAP1, BARD1, BLM, BMPR1A, BRCA1, BRCA2, BRIP1, CDC73, CDH1, CDK4, CDKN1B, CDKN2A, CHEK2, CTNNA1, DICER1, FANCC,  FH, FLCN, GALNT12, KIF1B, LZTR1, MAX, MEN1, MET, MLH1, MSH2, MSH3, MSH6, MUTYH, NBN, NF1, NF2, NTHL1, PALB2, PHOX2B, PMS2, POT1, PRKAR1A, PTCH1, PTEN, RAD51C, RAD51D, RB1, RECQL, RET, SDHA, SDHAF2, SDHB, SDHC, SDHD, SMAD4, SMARCA4, SMARCB1, SMARCE1, STK11, SUFU, TMEM127, TP53, TSC1, TSC2, VHL and XRCC2 (sequencing and deletion/duplication); EGFR, EGLN1, HOXB13, KIT, MITF, PDGFRA, POLD1, and POLE (sequencing only); EPCAM and GREM1 (deletion/duplication only).    03/18/2023 -  Chemotherapy   Patient is on Treatment Plan : BLADDER Pembrolizumab (200) q21d     Malignant neoplasm of trigone of urinary bladder (HCC)  09/12/2020 Initial Diagnosis   Malignant neoplasm of trigone of urinary bladder (HCC)   09/26/2020 - 10/31/2020 Chemotherapy   Patient is on Treatment Plan : BLADDER Gemcitabine q7d         REVIEW OF SYSTEMS:   Constitutional: Denies fevers, chills or abnormal weight loss Eyes: Denies blurriness of vision Ears, nose, mouth, throat, and face: Denies mucositis or sore throat Respiratory: Denies cough, dyspnea or wheezes Cardiovascular: Denies palpitation, chest discomfort or lower extremity swelling Gastrointestinal:  Denies nausea, heartburn or change in bowel habits Skin: Denies abnormal skin rashes Lymphatics: Denies new lymphadenopathy or easy bruising Neurological:Denies numbness, tingling or new weaknesses Behavioral/Psych: Mood is stable, no new changes  All other systems were reviewed with the patient and are negative.   VITALS:   Today's Vitals   07/01/23 1334 07/01/23 1356 07/01/23 1358  BP: (!) 151/65  138/75  Pulse: 75    Resp: 18    Temp: 98.4 F (36.9 C)    TempSrc: Temporal    SpO2: 98%    Weight: 182 lb (82.6 kg)    PainSc:  0-No pain    Body mass index is 26.11 kg/m.   Wt Readings from Last 3 Encounters:  07/01/23 182 lb (82.6 kg)  06/10/23 182 lb 3.2 oz (82.6 kg)  05/20/23 186 lb 3 oz (84.5 kg)    Body mass index is 26.11 kg/m.  Performance  status (ECOG): 1 - Symptomatic but completely ambulatory  PHYSICAL EXAM:   GENERAL:alert, no distress and comfortable SKIN: skin color, texture, turgor are normal, no rashes or significant lesions EYES: normal, Conjunctiva are pink and non-injected, sclera clear OROPHARYNX:no exudate, no erythema and lips, buccal mucosa, and tongue normal  NECK: supple, thyroid normal size, non-tender, without nodularity LYMPH:  no palpable lymphadenopathy in the cervical, axillary or inguinal LUNGS: clear to auscultation and percussion with normal breathing effort HEART: regular rate & rhythm and no murmurs and no lower extremity edema ABDOMEN:abdomen  soft, non-tender and normal bowel sounds Musculoskeletal:no cyanosis of digits and no clubbing  NEURO: alert & oriented x 3 with fluent speech, no focal motor/sensory deficits  LABORATORY DATA:  I have reviewed the data as listed    Component Value Date/Time   NA 140 07/01/2023 1306   K 3.8 07/01/2023 1306   CL 107 07/01/2023 1306   CO2 29 07/01/2023 1306   GLUCOSE 113 (H) 07/01/2023 1306   BUN 12 07/01/2023 1306   CREATININE 0.86 07/01/2023 1306   CALCIUM 9.5 07/01/2023 1306   PROT 7.1 07/01/2023 1306   ALBUMIN 4.3 07/01/2023 1306   AST 18 07/01/2023 1306   ALT 17 07/01/2023 1306   ALKPHOS 51 07/01/2023 1306   BILITOT 0.4 07/01/2023 1306   GFRNONAA >60 07/01/2023 1306   GFRAA >60 05/11/2020 1300    Lab Results  Component Value Date   WBC 5.3 07/01/2023   NEUTROABS 2.8 07/01/2023   HGB 12.4 (L) 07/01/2023   HCT 36.4 (L) 07/01/2023   MCV 89.9 07/01/2023   PLT 162 07/01/2023

## 2023-07-01 ENCOUNTER — Inpatient Hospital Stay: Payer: Medicare HMO | Attending: Hematology

## 2023-07-01 ENCOUNTER — Inpatient Hospital Stay (HOSPITAL_BASED_OUTPATIENT_CLINIC_OR_DEPARTMENT_OTHER): Payer: Medicare HMO | Admitting: Nurse Practitioner

## 2023-07-01 ENCOUNTER — Other Ambulatory Visit: Payer: Self-pay

## 2023-07-01 VITALS — BP 138/75 | HR 75 | Temp 98.4°F | Resp 18 | Wt 182.0 lb

## 2023-07-01 DIAGNOSIS — C671 Malignant neoplasm of dome of bladder: Secondary | ICD-10-CM

## 2023-07-01 DIAGNOSIS — Z79899 Other long term (current) drug therapy: Secondary | ICD-10-CM | POA: Insufficient documentation

## 2023-07-01 DIAGNOSIS — Z5112 Encounter for antineoplastic immunotherapy: Secondary | ICD-10-CM | POA: Insufficient documentation

## 2023-07-01 DIAGNOSIS — C670 Malignant neoplasm of trigone of bladder: Secondary | ICD-10-CM

## 2023-07-01 DIAGNOSIS — Z79620 Long term (current) use of immunosuppressive biologic: Secondary | ICD-10-CM | POA: Insufficient documentation

## 2023-07-01 DIAGNOSIS — Z95828 Presence of other vascular implants and grafts: Secondary | ICD-10-CM

## 2023-07-01 LAB — CBC WITH DIFFERENTIAL (CANCER CENTER ONLY)
Abs Immature Granulocytes: 0.03 10*3/uL (ref 0.00–0.07)
Basophils Absolute: 0 10*3/uL (ref 0.0–0.1)
Basophils Relative: 1 %
Eosinophils Absolute: 0.3 10*3/uL (ref 0.0–0.5)
Eosinophils Relative: 6 %
HCT: 36.4 % — ABNORMAL LOW (ref 39.0–52.0)
Hemoglobin: 12.4 g/dL — ABNORMAL LOW (ref 13.0–17.0)
Immature Granulocytes: 1 %
Lymphocytes Relative: 30 %
Lymphs Abs: 1.6 10*3/uL (ref 0.7–4.0)
MCH: 30.6 pg (ref 26.0–34.0)
MCHC: 34.1 g/dL (ref 30.0–36.0)
MCV: 89.9 fL (ref 80.0–100.0)
Monocytes Absolute: 0.5 10*3/uL (ref 0.1–1.0)
Monocytes Relative: 10 %
Neutro Abs: 2.8 10*3/uL (ref 1.7–7.7)
Neutrophils Relative %: 52 %
Platelet Count: 162 10*3/uL (ref 150–400)
RBC: 4.05 MIL/uL — ABNORMAL LOW (ref 4.22–5.81)
RDW: 12.4 % (ref 11.5–15.5)
WBC Count: 5.3 10*3/uL (ref 4.0–10.5)
nRBC: 0 % (ref 0.0–0.2)

## 2023-07-01 LAB — CMP (CANCER CENTER ONLY)
ALT: 17 U/L (ref 0–44)
AST: 18 U/L (ref 15–41)
Albumin: 4.3 g/dL (ref 3.5–5.0)
Alkaline Phosphatase: 51 U/L (ref 38–126)
Anion gap: 4 — ABNORMAL LOW (ref 5–15)
BUN: 12 mg/dL (ref 8–23)
CO2: 29 mmol/L (ref 22–32)
Calcium: 9.5 mg/dL (ref 8.9–10.3)
Chloride: 107 mmol/L (ref 98–111)
Creatinine: 0.86 mg/dL (ref 0.61–1.24)
GFR, Estimated: >60 mL/min (ref >60)
Glucose, Bld: 113 mg/dL — ABNORMAL HIGH (ref 70–99)
Potassium: 3.8 mmol/L (ref 3.5–5.1)
Sodium: 140 mmol/L (ref 135–145)
Total Bilirubin: 0.4 mg/dL (ref <1.2)
Total Protein: 7.1 g/dL (ref 6.5–8.1)

## 2023-07-01 LAB — TSH: TSH: 0.848 u[IU]/mL (ref 0.350–4.500)

## 2023-07-01 MED ORDER — SODIUM CHLORIDE 0.9 % IV SOLN: Freq: Once | INTRAVENOUS | Status: AC

## 2023-07-01 MED ORDER — SODIUM CHLORIDE 0.9% FLUSH
10.0000 mL | INTRAVENOUS | Status: DC | PRN
  Administered 2023-07-01: 10 mL

## 2023-07-01 MED ORDER — HEPARIN SOD (PORK) LOCK FLUSH 100 UNIT/ML IV SOLN
500.0000 [IU] | Freq: Once | INTRAVENOUS | Status: AC | PRN
  Administered 2023-07-01: 500 [IU]

## 2023-07-01 MED ORDER — SODIUM CHLORIDE 0.9 % IV SOLN
200.0000 mg | Freq: Once | INTRAVENOUS | Status: AC
  Administered 2023-07-01: 200 mg via INTRAVENOUS
  Filled 2023-07-01: qty 200

## 2023-07-01 NOTE — Patient Instructions (Signed)

## 2023-07-02 LAB — T4: T4, Total: 10.4 ug/dL (ref 4.5–12.0)

## 2023-07-04 ENCOUNTER — Other Ambulatory Visit: Payer: Self-pay

## 2023-07-04 DIAGNOSIS — I10 Essential (primary) hypertension: Secondary | ICD-10-CM | POA: Diagnosis not present

## 2023-07-04 DIAGNOSIS — E785 Hyperlipidemia, unspecified: Secondary | ICD-10-CM | POA: Diagnosis not present

## 2023-07-04 DIAGNOSIS — E1169 Type 2 diabetes mellitus with other specified complication: Secondary | ICD-10-CM | POA: Diagnosis not present

## 2023-07-04 DIAGNOSIS — D649 Anemia, unspecified: Secondary | ICD-10-CM | POA: Diagnosis not present

## 2023-07-04 DIAGNOSIS — E039 Hypothyroidism, unspecified: Secondary | ICD-10-CM | POA: Diagnosis not present

## 2023-07-04 DIAGNOSIS — N401 Enlarged prostate with lower urinary tract symptoms: Secondary | ICD-10-CM | POA: Diagnosis not present

## 2023-07-05 ENCOUNTER — Other Ambulatory Visit: Payer: Self-pay

## 2023-07-06 ENCOUNTER — Encounter: Payer: Self-pay | Admitting: Hematology

## 2023-07-06 ENCOUNTER — Encounter: Payer: Self-pay | Admitting: Nurse Practitioner

## 2023-07-16 DIAGNOSIS — Z961 Presence of intraocular lens: Secondary | ICD-10-CM | POA: Diagnosis not present

## 2023-07-16 DIAGNOSIS — E119 Type 2 diabetes mellitus without complications: Secondary | ICD-10-CM | POA: Diagnosis not present

## 2023-07-16 DIAGNOSIS — H40021 Open angle with borderline findings, high risk, right eye: Secondary | ICD-10-CM | POA: Diagnosis not present

## 2023-07-17 DIAGNOSIS — R82998 Other abnormal findings in urine: Secondary | ICD-10-CM | POA: Diagnosis not present

## 2023-07-17 DIAGNOSIS — N401 Enlarged prostate with lower urinary tract symptoms: Secondary | ICD-10-CM | POA: Diagnosis not present

## 2023-07-17 DIAGNOSIS — D692 Other nonthrombocytopenic purpura: Secondary | ICD-10-CM | POA: Diagnosis not present

## 2023-07-17 DIAGNOSIS — E1169 Type 2 diabetes mellitus with other specified complication: Secondary | ICD-10-CM | POA: Diagnosis not present

## 2023-07-17 DIAGNOSIS — G3184 Mild cognitive impairment, so stated: Secondary | ICD-10-CM | POA: Diagnosis not present

## 2023-07-17 DIAGNOSIS — R5383 Other fatigue: Secondary | ICD-10-CM | POA: Diagnosis not present

## 2023-07-17 DIAGNOSIS — I10 Essential (primary) hypertension: Secondary | ICD-10-CM | POA: Diagnosis not present

## 2023-07-17 DIAGNOSIS — G894 Chronic pain syndrome: Secondary | ICD-10-CM | POA: Diagnosis not present

## 2023-07-17 DIAGNOSIS — Z0000 Encounter for general adult medical examination without abnormal findings: Secondary | ICD-10-CM | POA: Diagnosis not present

## 2023-07-17 DIAGNOSIS — S46011A Strain of muscle(s) and tendon(s) of the rotator cuff of right shoulder, initial encounter: Secondary | ICD-10-CM | POA: Diagnosis not present

## 2023-07-18 ENCOUNTER — Encounter: Payer: Self-pay | Admitting: Hematology

## 2023-07-21 NOTE — Assessment & Plan Note (Signed)
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 He received Intravesicular gemcitabine with a total of 2000 mg weekly X6 started in February 2022  -bladder biopsies, bladder washings on 06/24/2022 showed focal urothelial carcinoma in situ (CIS) and cytology was positive for high grade urothelial carcinoma -I recommended intravesical gemcitabine 2000 mg weekly x 6, he started on 08/28/16.  He tolerated first cycle poorly with significant hematuria, and intermittent fever for week after treatment. -Due to his poor tolerance to intravesical gemcitabine, I changed therapy to intravesical mitomycin 40 mg every week for 5 weeks. He tolerated the treatment very well  -His surveillance cystoscopy and biopsy were negative for residual cancer -Given his good response to intravesical mitomycin, and his high risk of recurrence, Dr. Retta Diones recommend small intravesical therapy.  Patient agreed to proceed, started in in April 2024. Unfortunately he has had recurrent leakage from the intravesical therapy, and not able to continue. -Treatment was switched to [facility] on March 18, 2023, he tolerated first cycle well.  Plan to continue every 3 weeks for up to 2 years if no recurrence. -He is overall tolerating treatment well, with mild fatigue. He does have some dry skin, he states "all over." He does have some mild, atopic dermatitis noted on the cheek bone areas. He has just started to use Jergen's lotion for diabetics which seems to help the dryness. no significant toxicity, will continue treatment. 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 He received Intravesicular gemcitabine with a total of  2000 mg weekly X6 started in February 2022  -bladder biopsies, bladder washings on 06/24/2022 showed focal urothelial carcinoma in situ (CIS) and cytology was positive for high grade urothelial carcinoma -I recommended intravesical gemcitabine 2000 mg weekly x 6, he started on 08/28/16.  He tolerated first cycle poorly with significant hematuria, and intermittent fever for week after treatment. -Due to his poor tolerance to intravesical gemcitabine, I changed therapy to intravesical mitomycin 40 mg every week for 5 weeks. He tolerated the treatment very well  -His surveillance cystoscopy and biopsy were negative for residual cancer -Given his good response to intravesical mitomycin, and his high risk of recurrence, Dr. Retta Diones recommend small intravesical therapy.  Patient agreed to proceed, started in in April 2024. Unfortunately he has had recurrent leakage from the intravesical therapy, and not able to continue. -Treatment was switched to [facility] on March 18, 2023, he tolerated first cycle well.  Plan to continue every 3 weeks for up to 2 years if no recurrence. -He is overall tolerating treatment well, with mild fatigue. He does have some dry skin, he states "all over." He does have some mild, atopic dermatitis noted on the cheek bone areas. He has just started to use Jergen's lotion for diabetics which seems to help the dryness. no significant toxicity, will continue treatment. -today, he presents for Cycle #7 day 1 . -Will follow-up with his urologist for cystoscopy monitoring.

## 2023-07-21 NOTE — Progress Notes (Unsigned)
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 He received Intravesicular gemcitabine with a total of 2000 mg weekly X6 started in February 2022  -bladder biopsies, bladder washings on 06/24/2022 showed focal urothelial carcinoma in situ (CIS) and cytology was positive for high grade urothelial carcinoma -I recommended intravesical gemcitabine 2000 mg weekly x 6, he started on 08/28/16.  He tolerated first cycle poorly with significant hematuria, and intermittent fever for week after treatment. -Due to his poor tolerance to intravesical gemcitabine, I changed therapy to intravesical mitomycin 40 mg every week for 5 weeks. He tolerated the treatment very well  -His surveillance cystoscopy and biopsy were negative for residual cancer -Given his good response to intravesical mitomycin, and his high risk of recurrence, Dr. Retta Diones recommend small intravesical therapy.  Patient agreed to proceed, started in in April 2024. Unfortunately he has had recurrent leakage from the intravesical therapy, and not able to continue. -Treatment was switched to [facility] on March 18, 2023, he tolerated first cycle well.  Plan to continue every 3 weeks for up to 2 years if no recurrence. -He is overall tolerating treatment well, with mild fatigue. He does have some dry skin, he states "all over." He does have some mild, atopic dermatitis noted on the cheek bone areas. He has just started to use Jergen's lotion for diabetics  which seems to help the dryness. no significant toxicity, will continue treatment. 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 He received Intravesicular gemcitabine with a total of 2000 mg weekly X6 started in February 2022  -bladder biopsies, bladder washings on 06/24/2022 showed focal urothelial carcinoma in situ (CIS) and cytology was positive for high grade urothelial carcinoma -I recommended intravesical gemcitabine 2000 mg weekly x 6, he started on 08/28/16.  He tolerated first cycle poorly with significant hematuria, and intermittent fever for week after treatment. -Due to his poor tolerance to intravesical gemcitabine, I changed therapy to intravesical mitomycin 40 mg every week for 5 weeks. He tolerated the treatment very well  -His surveillance cystoscopy and biopsy were negative for residual cancer -Given his good response to intravesical mitomycin, and his high risk of recurrence, Dr. Retta Diones recommend small intravesical therapy.  Patient agreed to proceed, started in in April 2024. Unfortunately he has had recurrent leakage from the intravesical therapy, and not able to continue. -Treatment was switched to [facility] on March 18, 2023, he tolerated first cycle well.  Plan to continue every 3 weeks for up to 2 years if no recurrence. -He is overall tolerating treatment well, with mild fatigue. He does have some dry skin, he states "all over." He does have some mild, atopic dermatitis noted on the cheek bone areas. He has just started to use Jergen's lotion for diabetics which seems to help the dryness. no significant toxicity, will continue treatment. -today, he presents for Cycle #6 day 1 . -Will follow-up with his urologist for  cystoscopy monitoring.        The patient understands the plans discussed  today and is in agreement with them.  He knows to contact our office if he develops concerns prior to his next appointment.  I provided *** minutes of face-to-face time during this encounter and > 50% was spent counseling as documented under my assessment and plan.    Carlean Jews, NP  [facility] - A DEPT OF [facility] [address] [place] [address] Dept: [phone] Dept Fax: [phone]   No orders of the defined types were placed in this encounter.     CHIEF COMPLAINT:  CC: Malignant neoplasm of the bladder  Current Treatment: Keytruda every 3 weeks  INTERVAL HISTORY:  Tonia is here today for repeat clinical assessment.  He was last seen by myself on 07/01/2023.  Complaining of dry and itchy skin all over the body.  Today is cycle 7 day 1.  Followed by urology.  He denies fevers or chills. He denies pain. His appetite is good. His weight {Weight change:10426}.  I have reviewed the past medical history, past surgical history, social history and family history with the patient and they are unchanged from previous note.  ALLERGIES:  has No Known Allergies.  MEDICATIONS:  Current Outpatient Medications  Medication Sig Dispense Refill   acetaminophen (TYLENOL) 500 MG tablet Take 1,000 mg by mouth every 8 (eight) hours as needed for moderate pain.     amLODipine (NORVASC) 10 MG tablet Take 10 mg by mouth daily.     atorvastatin (LIPITOR) 20 MG tablet Take 20 mg by mouth at bedtime.      carteolol (OCUPRESS) 1 % ophthalmic solution Place 1 drop into the right eye every morning.     cetirizine (ZYRTEC) 10 MG tablet Take 10 mg by mouth daily as needed for allergies.     clotrimazole-betamethasone (LOTRISONE) lotion Apply 1 Application topically daily as needed (irritation).     fluticasone (FLONASE) 50 MCG/ACT nasal spray Place 2 sprays into both nostrils daily as needed for allergies.      glimepiride  (AMARYL) 1 MG tablet Take 1 mg by mouth daily with breakfast.     ibuprofen (ADVIL) 200 MG tablet Take 600 mg by mouth every 8 (eight) hours as needed for moderate pain.     latanoprost (XALATAN) 0.005 % ophthalmic solution Place 1 drop into the right eye at bedtime.     levothyroxine (SYNTHROID, LEVOTHROID) 100 MCG tablet Take 100 mcg by mouth daily before breakfast.      lidocaine-prilocaine (EMLA) cream Apply 1 Application topically as needed. 30 g 0   losartan (COZAAR) 100 MG tablet Take 100 mg by mouth at bedtime.      meloxicam (MOBIC) 15 MG tablet Take 15 mg by mouth daily.     Misc Natural Products (COMPLETE PROSTATE HEALTH PO) Take 1 tablet by mouth 2 (two) times daily.     omeprazole (PRILOSEC) 20 MG capsule Take 20 mg by mouth daily.     oxybutynin (DITROPAN-XL) 5 MG 24 hr tablet TAKE 1 TABLET (5 MG TOTAL) BY MOUTH ONCE A WEEK. TAKE 2 HOURS BEFORE SCHEDULED CHEMO IN OUR OFFICE 90 tablet 1   oxycodone (OXY-IR) 5 MG capsule Take 5 mg by mouth every 6 (six) hours as needed for pain.     sitaGLIPtin (JANUVIA) 100 MG tablet Take 100 mg by mouth every morning.  tamsulosin (FLOMAX) 0.4 MG CAPS capsule Take 0.4 mg by mouth every morning.      vitamin B-12 (CYANOCOBALAMIN) 500 MCG tablet Take 500 mcg by mouth daily.     No current facility-administered medications for this visit.    HISTORY OF PRESENT ILLNESS:   Oncology History Overview Note   Cancer Staging  Bladder cancer [facility]) Staging form: Urinary Bladder, AJCC 8th Edition - Clinical: Stage I (cT1, cN0, cM0) - Signed by Benjiman Core, MD on 09/12/2020 WHO/ISUP grade (low/high): High Grade Histologic grading system: 2 grade system - Pathologic stage from 06/24/2022: Stage 0is (pTis, pN0, cM0) - Signed by Malachy Mood, MD on 08/24/2022 Stage prefix: Initial diagnosis WHO/ISUP grade (low/high): High Grade Histologic grading system: 2 grade system     Bladder cancer (HCC)  09/12/2020 Initial Diagnosis   Bladder cancer (HCC)    09/12/2020 Cancer Staging   Staging form: Urinary Bladder, AJCC 8th Edition - Clinical: Stage I (cT1, cN0, cM0) - Signed by Benjiman Core, MD on 09/12/2020 WHO/ISUP grade (low/high): High Grade Histologic grading system: 2 grade system   06/24/2022 Pathology Results             FINAL MICROSCOPIC DIAGNOSIS:  A. BLADDER, BIOPSY: Focal urothelial carcinoma in situ (CIS)     06/24/2022 Cancer Staging   Staging form: Urinary Bladder, AJCC 8th Edition - Pathologic stage from 06/24/2022: Stage 0is (pTis, pN0, cM0) - Signed by Malachy Mood, MD on 08/24/2022 Stage prefix: Initial diagnosis WHO/ISUP grade (low/high): High Grade Histologic grading system: 2 grade system   08/28/2022 - 08/28/2022 Chemotherapy   Patient is on Treatment Plan : BLADDER Gemcitabine INTRAVESICAL (2000) q7d     09/12/2022 - 02/26/2023 Chemotherapy   Patient is on Treatment Plan : BLADDER Mitomycin INTRAVESICAL (40 mg) q7d     10/25/2022 Genetic Testing   Negative [facility] CancerNext-Expanded +RNA Panel.  Report date is 10/25/2022.   The CancerNext-Expanded gene panel offered by [facility] and includes sequencing, rearrangement, and RNA analysis for the following 77 genes: AIP, ALK, APC, ATM, AXIN2, BAP1, BARD1, BLM, BMPR1A, BRCA1, BRCA2, BRIP1, CDC73, CDH1, CDK4, CDKN1B, CDKN2A, CHEK2, CTNNA1, DICER1, FANCC, FH, FLCN, GALNT12, KIF1B, LZTR1, MAX, MEN1, MET, MLH1, MSH2, MSH3, MSH6, MUTYH, NBN, NF1, NF2, NTHL1, PALB2, PHOX2B, PMS2, POT1, PRKAR1A, PTCH1, PTEN, RAD51C, RAD51D, RB1, RECQL, RET, SDHA, SDHAF2, SDHB, SDHC, SDHD, SMAD4, SMARCA4, SMARCB1, SMARCE1, STK11, SUFU, TMEM127, TP53, TSC1, TSC2, VHL and XRCC2 (sequencing and deletion/duplication); EGFR, EGLN1, HOXB13, KIT, MITF, PDGFRA, POLD1, and POLE (sequencing only); EPCAM and GREM1 (deletion/duplication only).    03/18/2023 -  Chemotherapy   Patient is on Treatment Plan : BLADDER Pembrolizumab (200) q21d     Malignant neoplasm of trigone of urinary bladder (HCC)   09/12/2020 Initial Diagnosis   Malignant neoplasm of trigone of urinary bladder (HCC)   09/26/2020 - 10/31/2020 Chemotherapy   Patient is on Treatment Plan : BLADDER Gemcitabine q7d         REVIEW OF SYSTEMS:   Constitutional: Denies fevers, chills or abnormal weight loss Eyes: Denies blurriness of vision Ears, nose, mouth, throat, and face: Denies mucositis or sore throat Respiratory: Denies cough, dyspnea or wheezes Cardiovascular: Denies palpitation, chest discomfort or lower extremity swelling Gastrointestinal:  Denies nausea, heartburn or change in bowel habits Skin: Denies abnormal skin rashes Lymphatics: Denies new lymphadenopathy or easy bruising Neurological:Denies numbness, tingling or new weaknesses Behavioral/Psych: Mood is stable, no new changes  All other systems were reviewed with the patient and are negative.   VITALS:  There were no vitals taken for this visit.  Wt Readings from Last 3 Encounters:  07/01/23 182 lb (82.6 kg)  06/10/23 182 lb 3.2 oz (82.6 kg)  05/20/23 186 lb 3 oz (84.5 kg)    There is no height or weight on file to calculate BMI.  Performance status (ECOG): {CHL ONC [id number]  PHYSICAL EXAM:   GENERAL:alert, no distress and comfortable SKIN: skin color, texture, turgor are normal, no rashes or significant lesions EYES: normal, Conjunctiva are pink and non-injected, sclera clear OROPHARYNX:no exudate, no erythema and lips, buccal mucosa, and tongue normal  NECK: supple, thyroid normal size, non-tender, without nodularity LYMPH:  no palpable lymphadenopathy in the cervical, axillary or inguinal LUNGS: clear to auscultation and percussion with normal breathing effort HEART: regular rate & rhythm and no murmurs and no lower extremity edema ABDOMEN:abdomen soft, non-tender and normal bowel sounds Musculoskeletal:no cyanosis of digits and no clubbing  NEURO: alert & oriented x 3 with fluent speech, no focal motor/sensory  deficits  LABORATORY DATA:  I have reviewed the data as listed    Component Value Date/Time   NA 140 07/01/2023 1306   K 3.8 07/01/2023 1306   CL 107 07/01/2023 1306   CO2 29 07/01/2023 1306   GLUCOSE 113 (H) 07/01/2023 1306   BUN 12 07/01/2023 1306   CREATININE 0.86 07/01/2023 1306   CALCIUM 9.5 07/01/2023 1306   PROT 7.1 07/01/2023 1306   ALBUMIN 4.3 07/01/2023 1306   AST 18 07/01/2023 1306   ALT 17 07/01/2023 1306   ALKPHOS 51 07/01/2023 1306   BILITOT 0.4 07/01/2023 1306   GFRNONAA >60 07/01/2023 1306   GFRAA >60 05/11/2020 1300    No results found for: "SPEP", "UPEP"  Lab Results  Component Value Date   WBC 5.3 07/01/2023   NEUTROABS 2.8 07/01/2023   HGB 12.4 (L) 07/01/2023   HCT 36.4 (L) 07/01/2023   MCV 89.9 07/01/2023   PLT 162 07/01/2023      Chemistry      Component Value Date/Time   NA 140 07/01/2023 1306   K 3.8 07/01/2023 1306   CL 107 07/01/2023 1306   CO2 29 07/01/2023 1306   BUN 12 07/01/2023 1306   CREATININE 0.86 07/01/2023 1306      Component Value Date/Time   CALCIUM 9.5 07/01/2023 1306   ALKPHOS 51 07/01/2023 1306   AST 18 07/01/2023 1306   ALT 17 07/01/2023 1306   BILITOT 0.4 07/01/2023 1306       RADIOGRAPHIC STUDIES: I have personally reviewed the radiological images as listed and agreed with the findings in the report. No results found.

## 2023-07-22 ENCOUNTER — Inpatient Hospital Stay: Payer: Medicare HMO

## 2023-07-22 ENCOUNTER — Inpatient Hospital Stay: Payer: Medicare HMO | Attending: Hematology

## 2023-07-22 ENCOUNTER — Encounter: Payer: Self-pay | Admitting: Nurse Practitioner

## 2023-07-22 ENCOUNTER — Inpatient Hospital Stay: Payer: Medicare HMO | Admitting: Nurse Practitioner

## 2023-07-22 VITALS — BP 136/73 | HR 69 | Temp 98.4°F | Resp 17 | Wt 184.7 lb

## 2023-07-22 DIAGNOSIS — C670 Malignant neoplasm of trigone of bladder: Secondary | ICD-10-CM | POA: Insufficient documentation

## 2023-07-22 DIAGNOSIS — C671 Malignant neoplasm of dome of bladder: Secondary | ICD-10-CM

## 2023-07-22 DIAGNOSIS — R5383 Other fatigue: Secondary | ICD-10-CM

## 2023-07-22 DIAGNOSIS — Z95828 Presence of other vascular implants and grafts: Secondary | ICD-10-CM

## 2023-07-22 DIAGNOSIS — Z5112 Encounter for antineoplastic immunotherapy: Secondary | ICD-10-CM | POA: Diagnosis not present

## 2023-07-22 DIAGNOSIS — Z79620 Long term (current) use of immunosuppressive biologic: Secondary | ICD-10-CM | POA: Insufficient documentation

## 2023-07-22 DIAGNOSIS — Z79899 Other long term (current) drug therapy: Secondary | ICD-10-CM | POA: Diagnosis not present

## 2023-07-22 LAB — CMP (CANCER CENTER ONLY)
ALT: 18 U/L (ref 0–44)
AST: 17 U/L (ref 15–41)
Albumin: 4.2 g/dL (ref 3.5–5.0)
Alkaline Phosphatase: 50 U/L (ref 38–126)
Anion gap: 6 (ref 5–15)
BUN: 14 mg/dL (ref 8–23)
CO2: 28 mmol/L (ref 22–32)
Calcium: 9.2 mg/dL (ref 8.9–10.3)
Chloride: 105 mmol/L (ref 98–111)
Creatinine: 0.9 mg/dL (ref 0.61–1.24)
GFR, Estimated: >60 mL/min (ref >60)
Glucose, Bld: 226 mg/dL — ABNORMAL HIGH (ref 70–99)
Potassium: 3.7 mmol/L (ref 3.5–5.1)
Sodium: 139 mmol/L (ref 135–145)
Total Bilirubin: 0.4 mg/dL (ref <1.2)
Total Protein: 7 g/dL (ref 6.5–8.1)

## 2023-07-22 LAB — CBC WITH DIFFERENTIAL (CANCER CENTER ONLY)
Abs Immature Granulocytes: 0.02 10*3/uL (ref 0.00–0.07)
Basophils Absolute: 0 10*3/uL (ref 0.0–0.1)
Basophils Relative: 1 %
Eosinophils Absolute: 0.4 10*3/uL (ref 0.0–0.5)
Eosinophils Relative: 6 %
HCT: 35.1 % — ABNORMAL LOW (ref 39.0–52.0)
Hemoglobin: 12 g/dL — ABNORMAL LOW (ref 13.0–17.0)
Immature Granulocytes: 0 %
Lymphocytes Relative: 24 %
Lymphs Abs: 1.4 10*3/uL (ref 0.7–4.0)
MCH: 30.8 pg (ref 26.0–34.0)
MCHC: 34.2 g/dL (ref 30.0–36.0)
MCV: 90 fL (ref 80.0–100.0)
Monocytes Absolute: 0.5 10*3/uL (ref 0.1–1.0)
Monocytes Relative: 8 %
Neutro Abs: 3.5 10*3/uL (ref 1.7–7.7)
Neutrophils Relative %: 61 %
Platelet Count: 169 10*3/uL (ref 150–400)
RBC: 3.9 MIL/uL — ABNORMAL LOW (ref 4.22–5.81)
RDW: 12.7 % (ref 11.5–15.5)
WBC Count: 5.7 10*3/uL (ref 4.0–10.5)
nRBC: 0 % (ref 0.0–0.2)

## 2023-07-22 LAB — T4, FREE: Free T4: 0.99 ng/dL (ref 0.61–1.12)

## 2023-07-22 LAB — TSH: TSH: 1.067 u[IU]/mL (ref 0.350–4.500)

## 2023-07-22 MED ORDER — PEMBROLIZUMAB CHEMO INJECTION 100 MG/4ML
200.0000 mg | Freq: Once | INTRAVENOUS | Status: AC
  Administered 2023-07-22: 200 mg via INTRAVENOUS
  Filled 2023-07-22: qty 200

## 2023-07-22 MED ORDER — SODIUM CHLORIDE 0.9 % IV SOLN: Freq: Once | INTRAVENOUS | Status: AC

## 2023-07-22 MED ORDER — SODIUM CHLORIDE 0.9% FLUSH
10.0000 mL | INTRAVENOUS | Status: DC | PRN
  Administered 2023-07-22: 10 mL

## 2023-07-22 NOTE — Patient Instructions (Signed)
 [facility] - A DEPT OF [facility]  Discharge Instructions: Thank you for choosing [facility] to provide your oncology and hematology care.   If you have a lab appointment with the Cancer Center, please go directly to the Cancer Center and check in at the registration area.   Wear comfortable clothing and clothing appropriate for easy access to any Portacath or PICC line.   We strive to give you quality time with your provider. You may need to reschedule your appointment if you arrive late (15 or more minutes).  Arriving late affects you and other patients whose appointments are after yours.  Also, if you miss three or more appointments without notifying the office, you may be dismissed from the clinic at the provider's discretion.      For prescription refill requests, have your pharmacy contact our office and allow 72 hours for refills to be completed.    Today you received the following chemotherapy and/or immunotherapy agents Rande Lawman      To help prevent nausea and vomiting after your treatment, we encourage you to take your nausea medication as directed.  BELOW ARE SYMPTOMS THAT SHOULD BE REPORTED IMMEDIATELY: *FEVER GREATER THAN 100.4 F (38 C) OR HIGHER *CHILLS OR SWEATING *NAUSEA AND VOMITING THAT IS NOT CONTROLLED WITH YOUR NAUSEA MEDICATION *UNUSUAL SHORTNESS OF BREATH *UNUSUAL BRUISING OR BLEEDING *URINARY PROBLEMS (pain or burning when urinating, or frequent urination) *BOWEL PROBLEMS (unusual diarrhea, constipation, pain near the anus) TENDERNESS IN MOUTH AND THROAT WITH OR WITHOUT PRESENCE OF ULCERS (sore throat, sores in mouth, or a toothache) UNUSUAL RASH, SWELLING OR PAIN  UNUSUAL VAGINAL DISCHARGE OR ITCHING   Items with * indicate a potential emergency and should be followed up as soon as possible or go to the Emergency Department if any problems should occur.  Please show the CHEMOTHERAPY ALERT CARD or IMMUNOTHERAPY  ALERT CARD at check-in to the Emergency Department and triage nurse.  Should you have questions after your visit or need to cancel or reschedule your appointment, please contact [facility] - A DEPT OF [facility]  Dept: [phone]  and follow the prompts.  Office hours are 8:00 a.m. to 4:30 p.m. Monday - Friday. Please note that voicemails left after 4:00 p.m. may not be returned until the following business day.  We are closed weekends and major holidays. You have access to a nurse at all times for urgent questions. Please call the main number to the clinic Dept: [phone] and follow the prompts.   For any non-urgent questions, you may also contact your provider using MyChart. We now offer e-Visits for anyone 41 and older to request care online for non-urgent symptoms. For details visit [email or web address].   Also download the MyChart app! Go to the app store, search "MyChart", open the app, select [place], and log in with your MyChart username and password.

## 2023-07-23 ENCOUNTER — Encounter: Payer: Self-pay | Admitting: Hematology

## 2023-07-28 ENCOUNTER — Encounter: Payer: Self-pay | Admitting: Podiatry

## 2023-07-28 ENCOUNTER — Ambulatory Visit: Payer: Medicare HMO | Admitting: Podiatry

## 2023-07-28 DIAGNOSIS — M79674 Pain in right toe(s): Secondary | ICD-10-CM

## 2023-07-28 DIAGNOSIS — M2041 Other hammer toe(s) (acquired), right foot: Secondary | ICD-10-CM

## 2023-07-28 DIAGNOSIS — E1149 Type 2 diabetes mellitus with other diabetic neurological complication: Secondary | ICD-10-CM | POA: Diagnosis not present

## 2023-07-28 DIAGNOSIS — M2042 Other hammer toe(s) (acquired), left foot: Secondary | ICD-10-CM | POA: Diagnosis not present

## 2023-07-28 DIAGNOSIS — Z0100 Encounter for examination of eyes and vision without abnormal findings: Secondary | ICD-10-CM | POA: Diagnosis not present

## 2023-07-28 DIAGNOSIS — M79675 Pain in left toe(s): Secondary | ICD-10-CM | POA: Diagnosis not present

## 2023-07-28 DIAGNOSIS — B351 Tinea unguium: Secondary | ICD-10-CM

## 2023-07-28 NOTE — Progress Notes (Signed)
Subjective:   Patient ID: Keith [name], male   DOB: 82 y.o.   MRN: [id number]   HPI Chief Complaint  Patient presents with   [facility]    RM#13 [facility] patient states his feet stay cold all the time.  82 year old male presents the office with above concerns.  He states his nails are thick and elongated he cannot trim them himself and are causing discomfort.  He also states that he gets cold sensation to his feet and occasional numbness and tingling.  No open lesions that he reports.  Review of Systems  All other systems reviewed and are negative.  Past Medical History:  Diagnosis Date   Bladder cancer (HCC)    Blind right eye    BPH (benign prostatic hypertrophy)    Cataracts, both eyes    Depression    DM type 2 (diabetes mellitus, type 2) (HCC)    DOE (dyspnea on exertion)    with heavy exertion   Full dentures    GERD (gastroesophageal reflux disease)    Glaucoma no peripheral or side vision   right eye blind   Glaucoma, right eye    HOH (hard of hearing)    Hyperlipidemia    Hypertension    Hypothyroidism    Local recurrence of cancer of urinary bladder (HCC) urologsit-  dr Retta Diones   1st dx 01-29-2016--  now recurrent 05-31-2016   Lower extremity edema 07/11/2020   right swells more than left goes down at night after sleeping   Lower urinary tract symptoms (LUTS) 2019   Migraine    Peripheral neuropathy    middle finger left hand   Pneumonia 2019   Skin abnormality    base of spine looks like pimple has had since 07-01-2020, no drainage area intact   Wears glasses     Past Surgical History:  Procedure Laterality Date   ANTERIOR CERVICAL DECOMP/DISCECTOMY FUSION  08-15-2000   C5 -- C6   ANTERIOR CERVICAL DECOMP/DISCECTOMY FUSION  03-04-2008   C6 -- C7   areas removed from back at spine  2021 jan and aug 2021   CYSTOSCOPY W/ RETROGRADES Left 05/11/2020   Procedure: Cystoscopy with left retrograde ureteropyelogram, resection of probable bladder tumor and left  trigonal/ureteral orifice region, 3 cm in size, fluoroscopic interpretation, left ureteroscopy, placement of 6 French by 26 cm contour double-J stent without tether;  Surgeon: Marcine Matar, MD;  Location: [facility];  Service: Urology;  Laterality: Left;   CYSTOSCOPY W/ RETROGRADES Bilateral 06/24/2022   Procedure: CYSTOSCOPY WITH RETROGRADE PYELOGRAM;  Surgeon: Marcine Matar, MD;  [facility];  Service: Urology;  Laterality: Bilateral;  1 HR   CYSTOSCOPY WITH BIOPSY N/A 01/29/2016   Procedure: CYSTOSCOPY WITH BIOPSY;  Surgeon: Marcine Matar, MD;  Location: [facility];  Service: Urology;  Laterality: N/A;   CYSTOSCOPY WITH BIOPSY N/A 05/31/2016   Procedure: CYSTOSCOPY WITH BIOPSY;  Surgeon: Marcine Matar, MD;  Location: [facility];  Service: Urology;  Laterality: N/A;   CYSTOSCOPY WITH BIOPSY N/A 06/24/2022   Procedure: CYSTOSCOPY WITH BIOPSY;  Surgeon: Marcine Matar, MD;  Location: [facility];  Service: Urology;  Laterality: N/A;   EYE SURGERY Right 1998 & 2000   Glu[place]oma   GLAUCOMA SURGERY  1998 and 2000   IR IMAGING GUIDED PORT INSERTION  03/31/2023   lower back  06/01/2019, 02-10-2020   nose cancer  areas removed  2019, 2021   x 2 / 3/ 2021 small area removed from  nose   surgery for sacral fracture  40 yrs ago   TRANSURETHRAL RESECTION OF BLADDER TUMOR N/A 07/17/2020   Procedure: TRANSURETHRAL RESECTION OF BLADDER TUMOR (TURBT);  Surgeon: Marcine Matar, MD;  Location[facility];  Service: Urology;  Laterality: N/A;     Current Outpatient Medications:    acetaminophen (TYLENOL) 500 MG tablet, Take 1,000 mg by mouth every 8 (eight) hours as needed for moderate pain., Disp: , Rfl:    amLODipine (NORVASC) 10 MG tablet, Take 10 mg by mouth daily., Disp: , Rfl:    atorvastatin (LIPITOR) 20 MG tablet, Take 20 mg by mouth at bedtime. , Disp: , Rfl:    carteolol (OCUPRESS) 1 % ophthalmic solution, Place 1  drop into the right eye every morning., Disp: , Rfl:    cetirizine (ZYRTEC) 10 MG tablet, Take 10 mg by mouth daily as needed for allergies., Disp: , Rfl:    clotrimazole-betamethasone (LOTRISONE) lotion, Apply 1 Application topically daily as needed (irritation)., Disp: , Rfl:    fluticasone (FLONASE) 50 MCG/ACT nasal spray, Place 2 sprays into both nostrils daily as needed for allergies. , Disp: , Rfl:    glimepiride (AMARYL) 1 MG tablet, Take 1 mg by mouth daily with breakfast., Disp: , Rfl:    ibuprofen (ADVIL) 200 MG tablet, Take 600 mg by mouth every 8 (eight) hours as needed for moderate pain., Disp: , Rfl:    latanoprost (XALATAN) 0.005 % ophthalmic solution, Place 1 drop into the right eye at bedtime., Disp: , Rfl:    levothyroxine (SYNTHROID, LEVOTHROID) 100 MCG tablet, Take 100 mcg by mouth daily before breakfast. , Disp: , Rfl:    lidocaine-prilocaine (EMLA) cream, Apply 1 Application topically as needed., Disp: 30 g, Rfl: 0   losartan (COZAAR) 100 MG tablet, Take 100 mg by mouth at bedtime. , Disp: , Rfl:    meloxicam (MOBIC) 15 MG tablet, Take 15 mg by mouth daily., Disp: , Rfl:    Misc Natural Products (COMPLETE PROSTATE HEALTH PO), Take 1 tablet by mouth 2 (two) times daily., Disp: , Rfl:    omeprazole (PRILOSEC) 20 MG capsule, Take 20 mg by mouth daily., Disp: , Rfl:    oxybutynin (DITROPAN-XL) 5 MG 24 hr tablet, TAKE 1 TABLET (5 MG TOTAL) BY MOUTH ONCE A WEEK. TAKE 2 HOURS BEFORE SCHEDULED CHEMO IN OUR OFFICE, Disp: 90 tablet, Rfl: 1   oxycodone (OXY-IR) 5 MG capsule, Take 5 mg by mouth every 6 (six) hours as needed for pain., Disp: , Rfl:    sitaGLIPtin (JANUVIA) 100 MG tablet, Take 100 mg by mouth every morning., Disp: , Rfl:    tamsulosin (FLOMAX) 0.4 MG CAPS capsule, Take 0.4 mg by mouth every morning. , Disp: , Rfl:    vitamin B-12 (CYANOCOBALAMIN) 500 MCG tablet, Take 500 mcg by mouth daily., Disp: , Rfl:   No Known Allergies        Objective:  Physical Exam   General: AAO x3, NAD  Dermatological: Nails are hypertrophic, dystrophic, brittle, discolored, elongated 10.  Yellow discoloration of the toenails.  No surrounding redness or drainage. Tenderness nails 1-5 bilaterally. No open lesions or pre-ulcerative lesions are identified today.  Vascular: Dorsalis Pedis artery and Posterior Tibial artery pedal pulses are 2/4 bilateral with immedate capillary fill time. There is no pain with calf compression, swelling, warmth, erythema.   Neruologic: Sensation decreased with Semmes Weinstein monofilament to the plantar forefoot.  Musculoskeletal: No other areas of discomfort.  Hammertoes present.  Assessment:  82 year old male with symptomatic onychomycosis, type 2 diabetes with neuropathy     Plan:  -Treatment options discussed including all alternatives, risks, and complications -Etiology of symptoms were discussed -Nails debrided 10 without complications or bleeding.  Discussed treatment options for nail fungus.  Recommend routine debridement. -Discussed neuropathy.  Previously on gabapentin but states he had to come off of this due to other issues, liver issues.  Discussed topical creams. -Inserts and diabetic shoes.-Follow-up with Nicki Guadalajara for this. -Daily foot inspection -Follow-up in 3 months or sooner if any problems arise. In the meantime, encouraged to call the office with any questions, concerns, change in symptoms.   Ovid Curd, DPM

## 2023-07-28 NOTE — Patient Instructions (Signed)

## 2023-07-29 ENCOUNTER — Other Ambulatory Visit: Payer: Self-pay

## 2023-08-11 NOTE — Progress Notes (Signed)
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 He received Intravesicular gemcitabine  with a total of 2000 mg weekly X6 started in February 2022  -bladder biopsies, bladder washings on 06/24/2022 showed focal urothelial carcinoma in situ (CIS) and cytology was positive for high grade urothelial carcinoma -I recommended intravesical gemcitabine  2000 mg weekly x 6, he started on 08/28/16.  He tolerated first cycle poorly with significant hematuria, and intermittent fever for week after treatment. -Due to his poor tolerance to intravesical gemcitabine , I changed therapy to intravesical mitomycin  40 mg every week for 5 weeks. He tolerated the treatment very well  -His surveillance cystoscopy and biopsy were negative for residual cancer -Given his good response to intravesical mitomycin , and his high risk of recurrence, Dr. Matilda recommend small intravesical therapy.  Patient agreed to proceed, started in in April 2024. Unfortunately he has had recurrent leakage from the intravesical therapy, and not able to continue. -Treatment was switched to Keytruda  on March 18, 2023, he tolerated first cycle well.  Plan to continue every 3 weeks for up to 2 years if no recurrence. -He is overall tolerating treatment well, with mild fatigue. He does have some dry skin, he states all over. He does have some mild, atopic dermatitis noted on the cheek bone areas. He has just started to use Jergen's lotion for diabetics  which seems to help the dryness. no significant toxicity, will continue treatment. 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 He received Intravesicular gemcitabine  with a total of 2000 mg weekly X6 started in February 2022  -bladder biopsies, bladder washings on 06/24/2022 showed focal urothelial carcinoma in situ (CIS) and cytology was positive for high grade urothelial carcinoma -I recommended intravesical gemcitabine  2000 mg weekly x 6, he started on 08/28/16.  He tolerated first cycle poorly with significant hematuria, and intermittent fever for week after treatment. -Due to his poor tolerance to intravesical gemcitabine , I changed therapy to intravesical mitomycin  40 mg every week for 5 weeks. He tolerated the treatment very well  -His surveillance cystoscopy and biopsy were negative for residual cancer -Given his good response to intravesical mitomycin , and his high risk of recurrence, Dr. Matilda recommend small intravesical therapy.  Patient agreed to proceed, started in in April 2024. Unfortunately he has had recurrent leakage from the intravesical therapy, and not able to continue. -Treatment was switched to Keytruda  on March 18, 2023, he tolerated first cycle well.  Plan to continue every 3 weeks for up to 2 years if no recurrence. -He is overall tolerating treatment well, with mild fatigue. He does have some dry skin, he states all over. He does have some mild, atopic dermatitis noted on the cheek bone areas. He has just started to use Jergen's lotion for diabetics which seems to help the dryness. no significant toxicity, will continue treatment. -08/12/2023 - today, he presents for Cycle #8 day 1 . -Will follow-up with his  urologist for cystoscopy monitoring.      Plan: Labs reviewed  -CBC showing WBC  5.5; Hgb 12.2; Hct 35.5; Plt 183; Anc 3.1 -CMP - K 3.6; glucose 179; BUN 18; Creatinine 1.03; eGFR > 60; Ca 9.0; LFTs normal.   Patient is clinically doing very well without concerns or complaints today.  Labs and patient condition are satisfactory for treatment.  Proceed with cycle #8 day 1. Recommended patient to increase oxybutynin  ER to 2 tablets every evening for total of 10 mg ER.  Will reassess at next visit and send new prescription as indicated. Labs/flush, follow-up, and treatment as scheduled in 3 weeks.  The patient understands the plans discussed today and is in agreement with them.  He knows to contact our office if he develops concerns prior to his next appointment.  I provided 20 minutes of face-to-face time during this encounter and > 50% was spent counseling as documented under my assessment and plan.    Powell FORBES Lessen, NP  [facility] - A DEPT OF [facility] [address] [place] [address] Dept: [phone] Dept Fax: [phone]   No orders of the defined types were placed in this encounter.     CHIEF COMPLAINT:  CC: Malignant neoplasm of the bladder  Current Treatment: Keytruda  every 3 weeks  INTERVAL HISTORY:  Keith [name] is here today for repeat clinical assessment.  He was last seen by myself on 07/22/2023.  Today is cycle 8 day 1 of Keytruda .  Has thus far tolerated very well.  He reports persistent dry and flaky skin which is itchy.  Does use lotion at times which helps.  He also has continued urinary frequency.  Currently using oxybutynin  5 mg at night which does not seem to be helping much.  He denies chest pain, chest pressure, or shortness of breath. He denies headaches or visual disturbances. He denies abdominal pain, nausea, vomiting, or changes in bowel or bladder habits.   He denies fevers or chills. He denies pain. His appetite is good. His weight has been stable.  I have reviewed the  past medical history, past surgical history, social history and family history with the patient and they are unchanged from previous note.  ALLERGIES:  has no known allergies.  MEDICATIONS:  Current Outpatient Medications  Medication Sig Dispense Refill   acetaminophen  (TYLENOL ) 500 MG tablet Take 1,000 mg by mouth every 8 (eight) hours as needed for moderate pain.     amLODipine  (NORVASC ) 10 MG tablet Take 10 mg by mouth daily.     atorvastatin  (LIPITOR) 20 MG tablet Take 20 mg by mouth at bedtime.      carteolol (OCUPRESS) 1 % ophthalmic solution Place 1 drop into the right eye every morning.     cetirizine (ZYRTEC) 10 MG tablet Take 10 mg by mouth daily as needed for allergies.     clotrimazole-betamethasone  (LOTRISONE) lotion Apply 1 Application topically daily as needed (irritation).     fluticasone  (FLONASE ) 50 MCG/ACT nasal spray Place 2 sprays into both nostrils daily as needed for allergies.      glimepiride  (AMARYL ) 1 MG tablet Take 1 mg by mouth daily with breakfast.     ibuprofen (ADVIL) 200 MG tablet Take 600 mg by mouth every 8 (eight) hours as needed for moderate pain.     latanoprost (XALATAN) 0.005 % ophthalmic solution Place 1 drop into the right eye at bedtime.  levothyroxine  (SYNTHROID , LEVOTHROID) 100 MCG tablet Take 100 mcg by mouth daily before breakfast.      lidocaine -prilocaine  (EMLA ) cream Apply 1 Application topically as needed. 30 g 0   losartan  (COZAAR ) 100 MG tablet Take 100 mg by mouth at bedtime.      meloxicam (MOBIC) 15 MG tablet Take 15 mg by mouth daily.     Misc Natural Products (COMPLETE PROSTATE HEALTH PO) Take 1 tablet by mouth 2 (two) times daily.     omeprazole (PRILOSEC) 20 MG capsule Take 20 mg by mouth daily.     oxybutynin  (DITROPAN -XL) 5 MG 24 hr tablet TAKE 1 TABLET (5 MG TOTAL) BY MOUTH ONCE A WEEK. TAKE 2 HOURS BEFORE SCHEDULED CHEMO IN OUR OFFICE 90 tablet 1   oxycodone  (OXY-IR) 5 MG capsule Take 5 mg by mouth every 6 (six) hours as  needed for pain.     sitaGLIPtin (JANUVIA) 100 MG tablet Take 100 mg by mouth every morning.     tamsulosin  (FLOMAX ) 0.4 MG CAPS capsule Take 0.4 mg by mouth every morning.      vitamin B-12 (CYANOCOBALAMIN ) 500 MCG tablet Take 500 mcg by mouth daily.     No current facility-administered medications for this visit.   Facility-Administered Medications Ordered in Other Visits  Medication Dose Route Frequency Provider Last Rate Last Admin   pembrolizumab  (KEYTRUDA ) 200 mg in sodium chloride  0.9 % 50 mL chemo infusion  200 mg Intravenous Once Lanny Callander, MD        HISTORY OF PRESENT ILLNESS:   Oncology History Overview Note   Cancer Staging  Bladder cancer [facility]) Staging form: Urinary Bladder, AJCC 8th Edition - Clinical: Stage I (cT1, cN0, cM0) - Signed by Amadeo Windell SAILOR, MD on 09/12/2020 WHO/ISUP grade (low/high): High Grade Histologic grading system: 2 grade system - Pathologic stage from 06/24/2022: Stage 0is (pTis, pN0, cM0) - Signed by Lanny Callander, MD on 08/24/2022 Stage prefix: Initial diagnosis WHO/ISUP grade (low/high): High Grade Histologic grading system: 2 grade system     Bladder cancer (HCC)  09/12/2020 Initial Diagnosis   Bladder cancer (HCC)   09/12/2020 Cancer Staging   Staging form: Urinary Bladder, AJCC 8th Edition - Clinical: Stage I (cT1, cN0, cM0) - Signed by Amadeo Windell SAILOR, MD on 09/12/2020 WHO/ISUP grade (low/high): High Grade Histologic grading system: 2 grade system   06/24/2022 Pathology Results             FINAL MICROSCOPIC DIAGNOSIS:  A. BLADDER, BIOPSY: Focal urothelial carcinoma in situ (CIS)     06/24/2022 Cancer Staging   Staging form: Urinary Bladder, AJCC 8th Edition - Pathologic stage from 06/24/2022: Stage 0is (pTis, pN0, cM0) - Signed by Lanny Callander, MD on 08/24/2022 Stage prefix: Initial diagnosis WHO/ISUP grade (low/high): High Grade Histologic grading system: 2 grade system   08/28/2022 - 08/28/2022 Chemotherapy   Patient is on  Treatment Plan : BLADDER Gemcitabine  INTRAVESICAL (2000) q7d     09/12/2022 - 02/26/2023 Chemotherapy   Patient is on Treatment Plan : BLADDER Mitomycin  INTRAVESICAL (40 mg) q7d     10/25/2022 Genetic Testing   Negative [facility] CancerNext-Expanded +RNA Panel.  Report date is 10/25/2022.   The CancerNext-Expanded gene panel offered by [facility] and includes sequencing, rearrangement, and RNA analysis for the following 77 genes: AIP, ALK, APC, ATM, AXIN2, BAP1, BARD1, BLM, BMPR1A, BRCA1, BRCA2, BRIP1, CDC73, CDH1, CDK4, CDKN1B, CDKN2A, CHEK2, CTNNA1, DICER1, FANCC, FH, FLCN, GALNT12, KIF1B, LZTR1, MAX, MEN1, MET, MLH1, MSH2, MSH3, MSH6, MUTYH, NBN, NF1,  NF2, NTHL1, PALB2, PHOX2B, PMS2, POT1, PRKAR1A, PTCH1, PTEN, RAD51C, RAD51D, RB1, RECQL, RET, SDHA, SDHAF2, SDHB, SDHC, SDHD, SMAD4, SMARCA4, SMARCB1, SMARCE1, STK11, SUFU, TMEM127, TP53, TSC1, TSC2, VHL and XRCC2 (sequencing and deletion/duplication); EGFR, EGLN1, HOXB13, KIT, MITF, PDGFRA, POLD1, and POLE (sequencing only); EPCAM and GREM1 (deletion/duplication only).    03/18/2023 -  Chemotherapy   Patient is on Treatment Plan : BLADDER Pembrolizumab  (200) q21d     Malignant neoplasm of trigone of urinary bladder (HCC)  09/12/2020 Initial Diagnosis   Malignant neoplasm of trigone of urinary bladder (HCC)   09/26/2020 - 10/31/2020 Chemotherapy   Patient is on Treatment Plan : BLADDER Gemcitabine  q7d         REVIEW OF SYSTEMS:   Constitutional: Denies fevers, chills or abnormal weight loss Eyes: Denies blurriness of vision Ears, nose, mouth, throat, and face: Denies mucositis or sore throat Respiratory: Denies cough, dyspnea or wheezes Cardiovascular: Denies palpitation, chest discomfort or lower extremity swelling Gastrointestinal:  Denies nausea, heartburn or change in bowel habits Skin: Denies abnormal skin rashes Lymphatics: Denies new lymphadenopathy or easy bruising Neurological:Denies numbness, tingling or new  weaknesses Behavioral/Psych: Mood is stable, no new changes  GU: overactive bladder, urinary frequency  All other systems were reviewed with the patient and are negative.   VITALS:   Today's Vitals   08/12/23 0903 08/12/23 0907  BP: (!) 133/58   Pulse: 77   Resp: 16   Temp: 97.6 F (36.4 C)   TempSrc: Temporal   SpO2: 99%   Weight: 184 lb (83.5 kg)   PainSc:  0-No pain   Body mass index is 26.4 kg/m.   Wt Readings from Last 3 Encounters:  08/12/23 184 lb (83.5 kg)  07/22/23 184 lb 11.2 oz (83.8 kg)  07/01/23 182 lb (82.6 kg)    Body mass index is 26.4 kg/m.  Performance status (ECOG): 1 - Symptomatic but completely ambulatory  PHYSICAL EXAM:   GENERAL:alert, no distress and comfortable SKIN: skin color, texture, turgor are normal, no rashes or significant lesions. Persistent dry and flaky skin. EYES: normal, Conjunctiva are pink and non-injected, sclera clear OROPHARYNX:no exudate, no erythema and lips, buccal mucosa, and tongue normal  NECK: supple, thyroid  normal size, non-tender, without nodularity LYMPH:  no palpable lymphadenopathy in the cervical, axillary or inguinal LUNGS: clear to auscultation and percussion with normal breathing effort HEART: regular rate & rhythm and no murmurs and no lower extremity edema ABDOMEN:abdomen soft, non-tender and normal bowel sounds Musculoskeletal:no cyanosis of digits and no clubbing  NEURO: alert & oriented x 3 with fluent speech, no focal motor/sensory deficits  LABORATORY DATA:  I have reviewed the data as listed    Component Value Date/Time   NA 139 08/12/2023 0839   K 3.6 08/12/2023 0839   CL 107 08/12/2023 0839   CO2 25 08/12/2023 0839   GLUCOSE 179 (H) 08/12/2023 0839   BUN 18 08/12/2023 0839   CREATININE 1.03 08/12/2023 0839   CALCIUM  9.0 08/12/2023 0839   PROT 7.0 08/12/2023 0839   ALBUMIN  4.2 08/12/2023 0839   AST 18 08/12/2023 0839   ALT 18 08/12/2023 0839   ALKPHOS 51 08/12/2023 0839   BILITOT 0.4  08/12/2023 0839   GFRNONAA >60 08/12/2023 0839   GFRAA >60 05/11/2020 1300    Lab Results  Component Value Date   WBC 5.5 08/12/2023   NEUTROABS 3.1 08/12/2023   HGB 12.2 (L) 08/12/2023   HCT 35.5 (L) 08/12/2023   MCV 89.9 08/12/2023   PLT 183  08/12/2023    

## 2023-08-11 NOTE — Assessment & Plan Note (Signed)
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 He received Intravesicular gemcitabine with a total of 2000 mg weekly X6 started in February 2022  -bladder biopsies, bladder washings on 06/24/2022 showed focal urothelial carcinoma in situ (CIS) and cytology was positive for high grade urothelial carcinoma -I recommended intravesical gemcitabine 2000 mg weekly x 6, he started on 08/28/16.  He tolerated first cycle poorly with significant hematuria, and intermittent fever for week after treatment. -Due to his poor tolerance to intravesical gemcitabine, I changed therapy to intravesical mitomycin 40 mg every week for 5 weeks. He tolerated the treatment very well  -His surveillance cystoscopy and biopsy were negative for residual cancer -Given his good response to intravesical mitomycin, and his high risk of recurrence, Dr. Retta Diones recommend small intravesical therapy.  Patient agreed to proceed, started in in April 2024. Unfortunately he has had recurrent leakage from the intravesical therapy, and not able to continue. -Treatment was switched to [facility] on March 18, 2023, he tolerated first cycle well.  Plan to continue every 3 weeks for up to 2 years if no recurrence. -He is overall tolerating treatment well, with mild fatigue. He does have some dry skin, he states "all over." He does have some mild, atopic dermatitis noted on the cheek bone areas. He has just started to use Jergen's lotion for diabetics which seems to help the dryness. no significant toxicity, will continue treatment. 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 He received Intravesicular gemcitabine with a total of  2000 mg weekly X6 started in February 2022  -bladder biopsies, bladder washings on 06/24/2022 showed focal urothelial carcinoma in situ (CIS) and cytology was positive for high grade urothelial carcinoma -I recommended intravesical gemcitabine 2000 mg weekly x 6, he started on 08/28/16.  He tolerated first cycle poorly with significant hematuria, and intermittent fever for week after treatment. -Due to his poor tolerance to intravesical gemcitabine, I changed therapy to intravesical mitomycin 40 mg every week for 5 weeks. He tolerated the treatment very well  -His surveillance cystoscopy and biopsy were negative for residual cancer -Given his good response to intravesical mitomycin, and his high risk of recurrence, Dr. Retta Diones recommend small intravesical therapy.  Patient agreed to proceed, started in in April 2024. Unfortunately he has had recurrent leakage from the intravesical therapy, and not able to continue. -Treatment was switched to [facility] on March 18, 2023, he tolerated first cycle well.  Plan to continue every 3 weeks for up to 2 years if no recurrence. -He is overall tolerating treatment well, with mild fatigue. He does have some dry skin, he states "all over." He does have some mild, atopic dermatitis noted on the cheek bone areas. He has just started to use Jergen's lotion for diabetics which seems to help the dryness. no significant toxicity, will continue treatment. -08/12/2023 - today, he presents for Cycle #8 day 1 . -Will follow-up with his urologist for cystoscopy monitoring.

## 2023-08-12 ENCOUNTER — Inpatient Hospital Stay: Payer: Medicare HMO

## 2023-08-12 ENCOUNTER — Encounter: Payer: Self-pay | Admitting: Nurse Practitioner

## 2023-08-12 ENCOUNTER — Inpatient Hospital Stay: Payer: Medicare HMO | Admitting: Nurse Practitioner

## 2023-08-12 VITALS — BP 133/58 | HR 77 | Temp 97.6°F | Resp 16 | Wt 184.0 lb

## 2023-08-12 DIAGNOSIS — C670 Malignant neoplasm of trigone of bladder: Secondary | ICD-10-CM | POA: Diagnosis not present

## 2023-08-12 DIAGNOSIS — Z79899 Other long term (current) drug therapy: Secondary | ICD-10-CM | POA: Diagnosis not present

## 2023-08-12 DIAGNOSIS — C671 Malignant neoplasm of dome of bladder: Secondary | ICD-10-CM

## 2023-08-12 DIAGNOSIS — Z79620 Long term (current) use of immunosuppressive biologic: Secondary | ICD-10-CM | POA: Diagnosis not present

## 2023-08-12 DIAGNOSIS — Z95828 Presence of other vascular implants and grafts: Secondary | ICD-10-CM

## 2023-08-12 DIAGNOSIS — Z5112 Encounter for antineoplastic immunotherapy: Secondary | ICD-10-CM | POA: Diagnosis not present

## 2023-08-12 LAB — CBC WITH DIFFERENTIAL (CANCER CENTER ONLY)
Abs Immature Granulocytes: 0.03 10*3/uL (ref 0.00–0.07)
Basophils Absolute: 0 10*3/uL (ref 0.0–0.1)
Basophils Relative: 1 %
Eosinophils Absolute: 0.4 10*3/uL (ref 0.0–0.5)
Eosinophils Relative: 7 %
HCT: 35.5 % — ABNORMAL LOW (ref 39.0–52.0)
Hemoglobin: 12.2 g/dL — ABNORMAL LOW (ref 13.0–17.0)
Immature Granulocytes: 1 %
Lymphocytes Relative: 23 %
Lymphs Abs: 1.3 10*3/uL (ref 0.7–4.0)
MCH: 30.9 pg (ref 26.0–34.0)
MCHC: 34.4 g/dL (ref 30.0–36.0)
MCV: 89.9 fL (ref 80.0–100.0)
Monocytes Absolute: 0.7 10*3/uL (ref 0.1–1.0)
Monocytes Relative: 13 %
Neutro Abs: 3.1 10*3/uL (ref 1.7–7.7)
Neutrophils Relative %: 55 %
Platelet Count: 183 10*3/uL (ref 150–400)
RBC: 3.95 MIL/uL — ABNORMAL LOW (ref 4.22–5.81)
RDW: 12.7 % (ref 11.5–15.5)
WBC Count: 5.5 10*3/uL (ref 4.0–10.5)
nRBC: 0 % (ref 0.0–0.2)

## 2023-08-12 LAB — CMP (CANCER CENTER ONLY)
ALT: 18 U/L (ref 0–44)
AST: 18 U/L (ref 15–41)
Albumin: 4.2 g/dL (ref 3.5–5.0)
Alkaline Phosphatase: 51 U/L (ref 38–126)
Anion gap: 7 (ref 5–15)
BUN: 18 mg/dL (ref 8–23)
CO2: 25 mmol/L (ref 22–32)
Calcium: 9 mg/dL (ref 8.9–10.3)
Chloride: 107 mmol/L (ref 98–111)
Creatinine: 1.03 mg/dL (ref 0.61–1.24)
GFR, Estimated: >60 mL/min (ref >60)
Glucose, Bld: 179 mg/dL — ABNORMAL HIGH (ref 70–99)
Potassium: 3.6 mmol/L (ref 3.5–5.1)
Sodium: 139 mmol/L (ref 135–145)
Total Bilirubin: 0.4 mg/dL (ref 0.0–1.2)
Total Protein: 7 g/dL (ref 6.5–8.1)

## 2023-08-12 MED ORDER — SODIUM CHLORIDE 0.9% FLUSH
10.0000 mL | INTRAVENOUS | Status: DC | PRN
Start: 2023-08-12 — End: 2023-08-12
  Administered 2023-08-12: 10 mL

## 2023-08-12 MED ORDER — SODIUM CHLORIDE 0.9 % IV SOLN
200.0000 mg | Freq: Once | INTRAVENOUS | Status: AC
  Administered 2023-08-12: 200 mg via INTRAVENOUS
  Filled 2023-08-12: qty 200

## 2023-08-12 MED ORDER — HEPARIN SOD (PORK) LOCK FLUSH 100 UNIT/ML IV SOLN
500.0000 [IU] | Freq: Once | INTRAVENOUS | Status: AC | PRN
  Administered 2023-08-12: 500 [IU]

## 2023-08-12 MED ORDER — SODIUM CHLORIDE 0.9 % IV SOLN: Freq: Once | INTRAVENOUS | Status: AC

## 2023-08-12 NOTE — Patient Instructions (Signed)
 [facility] - A DEPT OF [facility]  Discharge Instructions: Thank you for choosing [facility] to provide your oncology and hematology care.   If you have a lab appointment with the Cancer Center, please go directly to the Cancer Center and check in at the registration area.   Wear comfortable clothing and clothing appropriate for easy access to any Portacath or PICC line.   We strive to give you quality time with your provider. You may need to reschedule your appointment if you arrive late (15 or more minutes).  Arriving late affects you and other patients whose appointments are after yours.  Also, if you miss three or more appointments without notifying the office, you may be dismissed from the clinic at the provider's discretion.      For prescription refill requests, have your pharmacy contact our office and allow 72 hours for refills to be completed.    Today you received the following chemotherapy and/or immunotherapy agents :  Pembrolizumab       To help prevent nausea and vomiting after your treatment, we encourage you to take your nausea medication as directed.  BELOW ARE SYMPTOMS THAT SHOULD BE REPORTED IMMEDIATELY: *FEVER GREATER THAN 100.4 F (38 C) OR HIGHER *CHILLS OR SWEATING *NAUSEA AND VOMITING THAT IS NOT CONTROLLED WITH YOUR NAUSEA MEDICATION *UNUSUAL SHORTNESS OF BREATH *UNUSUAL BRUISING OR BLEEDING *URINARY PROBLEMS (pain or burning when urinating, or frequent urination) *BOWEL PROBLEMS (unusual diarrhea, constipation, pain near the anus) TENDERNESS IN MOUTH AND THROAT WITH OR WITHOUT PRESENCE OF ULCERS (sore throat, sores in mouth, or a toothache) UNUSUAL RASH, SWELLING OR PAIN  UNUSUAL VAGINAL DISCHARGE OR ITCHING   Items with * indicate a potential emergency and should be followed up as soon as possible or go to the Emergency Department if any problems should occur.  Please show the CHEMOTHERAPY ALERT CARD or  IMMUNOTHERAPY ALERT CARD at check-in to the Emergency Department and triage nurse.  Should you have questions after your visit or need to cancel or reschedule your appointment, please contact [facility] - [facility] DE[facility]  Dept: [phone]  and follow the prompts.  Office hours are 8:00 a.m. to 4:30 p.m. Monday - Friday. Please note that voicemails left after 4:00 p.m. may not be returned until the following business day.  We are closed weekends and major holidays. You have access to a nurse at all times for urgent questions. Please call the main number to the clinic Dept: [phone] and follow the prompts.   For any non-urgent questions, you may also contact your provider using MyChart. We now offer e-Visits for anyone 25 and older to request care online for non-urgent symptoms. For details visit [email or web address].   Also download the MyChart app! Go to the app store, search "MyChart", open the app, select [facility], and log in with your MyChart username and password.

## 2023-08-13 ENCOUNTER — Encounter: Payer: Self-pay | Admitting: Hematology

## 2023-08-20 ENCOUNTER — Encounter: Payer: Self-pay | Admitting: Hematology

## 2023-08-20 ENCOUNTER — Ambulatory Visit: Payer: HMO

## 2023-08-20 DIAGNOSIS — M2141 Flat foot [pes planus] (acquired), right foot: Secondary | ICD-10-CM

## 2023-08-20 DIAGNOSIS — E1149 Type 2 diabetes mellitus with other diabetic neurological complication: Secondary | ICD-10-CM

## 2023-08-20 DIAGNOSIS — M2041 Other hammer toe(s) (acquired), right foot: Secondary | ICD-10-CM

## 2023-08-20 NOTE — Progress Notes (Signed)
 Patient presents to the office today for diabetic shoe and insole measuring.  Patient was measured with brannock device to determine size and width for 1 pair of extra depth shoes and foam casted for 3 pair of insoles.   Documentation of medical necessity will be sent to patient's treating diabetic doctor to verify and sign.   Patient's diabetic provider: Massie Sewer DO   Shoes and insoles will be ordered at that time and patient will be notified for an appointment for fitting when they arrive.   Shoe size (per patient): 10-10.5 Brannock measurement: 10 Patient shoe selection- Shoe choice:   410 /585 Shoe size ordered: 10.5 ABN signed

## 2023-08-26 ENCOUNTER — Encounter: Payer: Self-pay | Admitting: Hematology

## 2023-08-27 ENCOUNTER — Encounter: Payer: Self-pay | Admitting: Hematology

## 2023-08-28 DIAGNOSIS — M25511 Pain in right shoulder: Secondary | ICD-10-CM | POA: Diagnosis not present

## 2023-09-01 DIAGNOSIS — C678 Malignant neoplasm of overlapping sites of bladder: Secondary | ICD-10-CM | POA: Diagnosis not present

## 2023-09-01 DIAGNOSIS — N5201 Erectile dysfunction due to arterial insufficiency: Secondary | ICD-10-CM | POA: Diagnosis not present

## 2023-09-01 DIAGNOSIS — R3915 Urgency of urination: Secondary | ICD-10-CM | POA: Diagnosis not present

## 2023-09-01 NOTE — Assessment & Plan Note (Signed)
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 He received Intravesicular gemcitabine with a total of 2000 mg weekly X6 started in February 2022  -bladder biopsies, bladder washings on 06/24/2022 showed focal urothelial carcinoma in situ (CIS) and cytology was positive for high grade urothelial carcinoma -I recommended intravesical gemcitabine 2000 mg weekly x 6, he started on 08/28/16.  He tolerated first cycle poorly with significant hematuria, and intermittent fever for week after treatment. -Due to his poor tolerance to intravesical gemcitabine, I changed therapy to intravesical mitomycin 40 mg every week for 5 weeks. He tolerated the treatment very well  -His surveillance cystoscopy and biopsy were negative for residual cancer -Given his good response to intravesical mitomycin, and his high risk of recurrence, Dr. Retta Diones recommend small intravesical therapy.  Patient agrees to proceed, started in in April 2024. Unfortunately he has had recurrent leakage from the intravesical therapy, and not able to continue. -Treatment was switched to [facility] on March 18, 2023, he tolerated first cycle well.  Plan to continue every 3 weeks for up to 2 years if no recurrence.

## 2023-09-02 ENCOUNTER — Inpatient Hospital Stay: Payer: HMO

## 2023-09-02 ENCOUNTER — Inpatient Hospital Stay: Payer: HMO | Attending: Hematology

## 2023-09-02 ENCOUNTER — Encounter: Payer: Self-pay | Admitting: Hematology

## 2023-09-02 ENCOUNTER — Inpatient Hospital Stay (HOSPITAL_BASED_OUTPATIENT_CLINIC_OR_DEPARTMENT_OTHER): Payer: Self-pay | Admitting: Hematology

## 2023-09-02 VITALS — BP 127/68 | HR 75 | Temp 98.4°F | Resp 16 | Wt 182.6 lb

## 2023-09-02 DIAGNOSIS — C679 Malignant neoplasm of bladder, unspecified: Secondary | ICD-10-CM

## 2023-09-02 DIAGNOSIS — C670 Malignant neoplasm of trigone of bladder: Secondary | ICD-10-CM | POA: Insufficient documentation

## 2023-09-02 DIAGNOSIS — Z79899 Other long term (current) drug therapy: Secondary | ICD-10-CM | POA: Insufficient documentation

## 2023-09-02 DIAGNOSIS — Z95828 Presence of other vascular implants and grafts: Secondary | ICD-10-CM

## 2023-09-02 DIAGNOSIS — Z5112 Encounter for antineoplastic immunotherapy: Secondary | ICD-10-CM | POA: Diagnosis not present

## 2023-09-02 DIAGNOSIS — C671 Malignant neoplasm of dome of bladder: Secondary | ICD-10-CM

## 2023-09-02 DIAGNOSIS — Z79620 Long term (current) use of immunosuppressive biologic: Secondary | ICD-10-CM | POA: Insufficient documentation

## 2023-09-02 LAB — CBC WITH DIFFERENTIAL (CANCER CENTER ONLY)
Abs Immature Granulocytes: 0.05 10*3/uL (ref 0.00–0.07)
Basophils Absolute: 0 10*3/uL (ref 0.0–0.1)
Basophils Relative: 0 %
Eosinophils Absolute: 0.1 10*3/uL (ref 0.0–0.5)
Eosinophils Relative: 1 %
HCT: 38 % — ABNORMAL LOW (ref 39.0–52.0)
Hemoglobin: 13.1 g/dL (ref 13.0–17.0)
Immature Granulocytes: 1 %
Lymphocytes Relative: 12 %
Lymphs Abs: 1 10*3/uL (ref 0.7–4.0)
MCH: 30.9 pg (ref 26.0–34.0)
MCHC: 34.5 g/dL (ref 30.0–36.0)
MCV: 89.6 fL (ref 80.0–100.0)
Monocytes Absolute: 0.7 10*3/uL (ref 0.1–1.0)
Monocytes Relative: 8 %
Neutro Abs: 6.5 10*3/uL (ref 1.7–7.7)
Neutrophils Relative %: 78 %
Platelet Count: 196 10*3/uL (ref 150–400)
RBC: 4.24 MIL/uL (ref 4.22–5.81)
RDW: 12.6 % (ref 11.5–15.5)
WBC Count: 8.3 10*3/uL (ref 4.0–10.5)
nRBC: 0 % (ref 0.0–0.2)

## 2023-09-02 LAB — CMP (CANCER CENTER ONLY)
ALT: 26 U/L (ref 0–44)
AST: 16 U/L (ref 15–41)
Albumin: 4.3 g/dL (ref 3.5–5.0)
Alkaline Phosphatase: 52 U/L (ref 38–126)
Anion gap: 6 (ref 5–15)
BUN: 21 mg/dL (ref 8–23)
CO2: 30 mmol/L (ref 22–32)
Calcium: 9.8 mg/dL (ref 8.9–10.3)
Chloride: 101 mmol/L (ref 98–111)
Creatinine: 0.88 mg/dL (ref 0.61–1.24)
GFR, Estimated: >60 mL/min (ref >60)
Glucose, Bld: 284 mg/dL — ABNORMAL HIGH (ref 70–99)
Potassium: 3.8 mmol/L (ref 3.5–5.1)
Sodium: 137 mmol/L (ref 135–145)
Total Bilirubin: 0.5 mg/dL (ref 0.0–1.2)
Total Protein: 7 g/dL (ref 6.5–8.1)

## 2023-09-02 LAB — TSH: TSH: 0.875 u[IU]/mL (ref 0.350–4.500)

## 2023-09-02 MED ORDER — SODIUM CHLORIDE 0.9 % IV SOLN
200.0000 mg | Freq: Once | INTRAVENOUS | Status: AC
  Administered 2023-09-02: 200 mg via INTRAVENOUS
  Filled 2023-09-02: qty 8

## 2023-09-02 MED ORDER — SODIUM CHLORIDE 0.9% FLUSH
10.0000 mL | INTRAVENOUS | Status: DC | PRN
  Administered 2023-09-02: 10 mL

## 2023-09-02 MED ORDER — HEPARIN SOD (PORK) LOCK FLUSH 100 UNIT/ML IV SOLN
500.0000 [IU] | Freq: Once | INTRAVENOUS | Status: AC | PRN
  Administered 2023-09-02: 500 [IU]

## 2023-09-02 MED ORDER — SODIUM CHLORIDE 0.9 % IV SOLN
Freq: Once | INTRAVENOUS | Status: AC
Start: 2023-09-02 — End: 2023-09-02

## 2023-09-02 NOTE — Patient Instructions (Signed)

## 2023-09-02 NOTE — Progress Notes (Signed)
[facility]   [phone] Fax:([phone]   Clinic Follow up Note   Patient Care Team: Charlane Ferretti, DO as PCP - General (Internal Medicine) Malachy Mood, MD as Attending Physician (Hematology and Oncology) Marcine Matar, MD as Consulting Physician (Urology)  Date of Service:  09/02/2023  CHIEF COMPLAINT: f/u of bladder cancer  CURRENT THERAPY:  Keytruda every 3 weeks  Oncology History   Bladder cancer [facility]) -initially diagnosed in 2017  -He was found to have high-grade urothelial carcinoma without muscle invasion and developed and subsequently BCG refractory disease in December 2021. He received Intravesicular gemcitabine with a total of 2000 mg weekly X6 started in February 2022  -bladder biopsies, bladder washings on 06/24/2022 showed focal urothelial carcinoma in situ (CIS) and cytology was positive for high grade urothelial carcinoma -I recommended intravesical gemcitabine 2000 mg weekly x 6, he started on 08/28/16.  He tolerated first cycle poorly with significant hematuria, and intermittent fever for week after treatment. -Due to his poor tolerance to intravesical gemcitabine, I changed therapy to intravesical mitomycin 40 mg every week for 5 weeks. He tolerated the treatment very well  -His surveillance cystoscopy and biopsy were negative for residual cancer -Given his good response to intravesical mitomycin, and his high risk of recurrence, Dr. Retta Diones recommend small intravesical therapy.  Patient agrees to proceed, started in in April 2024. Unfortunately he has had recurrent leakage from the intravesical therapy, and not able to continue. -Treatment was switched to [facility] on March 18, 2023, he tolerated first cycle well.  Plan to continue every 3 weeks for up to 2 years if no recurrence.   Assessment and Plan    Bladder Cancer Currently undergoing Keytruda (pembrolizumab) infusions. Recent cystoscopy showed no evidence of recurrence.  Reports significant fatigue, likely due to [facility]. Thyroid function and blood pressure are normal. Keytruda treatment planned for two years with infusions every three weeks. Discussed option of spacing infusions to every six weeks with a double dose, but patient prefers current schedule. - Continue Keytruda infusions every three weeks - Schedule future appointments for infusions - Monitor for recurrence with regular cystoscopies  Fatigue Likely secondary to Keytruda infusions.  -We have been checking his thyroid function regularly which has been normal.  Also no signs of adrenal insufficiency  -I encouraged to stay active to mitigate fatigue. Thyroid function and blood pressure are normal, ruling out other common causes. Advised to engage in light activities such as walking or working in the shop. - Encourage physical activity to reduce fatigue  Back Pain Bulging disc below surgical site. Reports occasional, non-severe back pain. Recent shoulder pain relieved with injection. Discussed potential referral to orthopedic surgeon if pain worsens. - Consider referral to orthopedic surgeon if back pain worsens  General Health Maintenance Overall health is well-managed. Continues to monitor thyroid function and blood pressure regularly. - Continue monitoring thyroid function and blood pressure regularly  Plan -Lab reviewed, adequate for treatment, will proceed Keytruda today and continue every 3 weeks - Schedule lab, f/u and next infusion in three weeks         SUMMARY OF ONCOLOGIC HISTORY: Oncology History Overview Note   Cancer Staging  Bladder cancer [facility]) Staging form: Urinary Bladder, AJCC 8th Edition - Clinical: Stage I (cT1, cN0, cM0) - Signed by Benjiman Core, MD on 09/12/2020 WHO/ISUP grade (low/high): High Grade Histologic grading system: 2 grade system - Pathologic stage from 06/24/2022: Stage 0is (pTis, pN0, cM0) - Signed by Malachy Mood, MD on  08/24/2022 Stage prefix: Initial  diagnosis WHO/ISUP grade (low/high): High Grade Histologic grading system: 2 grade system     Bladder cancer (HCC)  09/12/2020 Initial Diagnosis   Bladder cancer (HCC)   09/12/2020 Cancer Staging   Staging form: Urinary Bladder, AJCC 8th Edition - Clinical: Stage I (cT1, cN0, cM0) - Signed by Benjiman Core, MD on 09/12/2020 WHO/ISUP grade (low/high): High Grade Histologic grading system: 2 grade system   06/24/2022 Pathology Results             FINAL MICROSCOPIC DIAGNOSIS:  A. BLADDER, BIOPSY: Focal urothelial carcinoma in situ (CIS)     06/24/2022 Cancer Staging   Staging form: Urinary Bladder, AJCC 8th Edition - Pathologic stage from 06/24/2022: Stage 0is (pTis, pN0, cM0) - Signed by Malachy Mood, MD on 08/24/2022 Stage prefix: Initial diagnosis WHO/ISUP grade (low/high): High Grade Histologic grading system: 2 grade system   08/28/2022 - 08/28/2022 Chemotherapy   Patient is on Treatment Plan : BLADDER Gemcitabine INTRAVESICAL (2000) q7d     09/12/2022 - 02/26/2023 Chemotherapy   Patient is on Treatment Plan : BLADDER Mitomycin INTRAVESICAL (40 mg) q7d     10/25/2022 Genetic Testing   Negative [facility] CancerNext-Expanded +RNA Panel.  Report date is 10/25/2022.   The CancerNext-Expanded gene panel offered by [facility] and includes sequencing, rearrangement, and RNA analysis for the following 77 genes: AIP, ALK, APC, ATM, AXIN2, BAP1, BARD1, BLM, BMPR1A, BRCA1, BRCA2, BRIP1, CDC73, CDH1, CDK4, CDKN1B, CDKN2A, CHEK2, CTNNA1, DICER1, FANCC, FH, FLCN, GALNT12, KIF1B, LZTR1, MAX, MEN1, MET, MLH1, MSH2, MSH3, MSH6, MUTYH, NBN, NF1, NF2, NTHL1, PALB2, PHOX2B, PMS2, POT1, PRKAR1A, PTCH1, PTEN, RAD51C, RAD51D, RB1, RECQL, RET, SDHA, SDHAF2, SDHB, SDHC, SDHD, SMAD4, SMARCA4, SMARCB1, SMARCE1, STK11, SUFU, TMEM127, TP53, TSC1, TSC2, VHL and XRCC2 (sequencing and deletion/duplication); EGFR, EGLN1, HOXB13, KIT, MITF, PDGFRA, POLD1, and POLE (sequencing only); EPCAM and GREM1  (deletion/duplication only).    03/18/2023 -  Chemotherapy   Patient is on Treatment Plan : BLADDER Pembrolizumab (200) q21d     Malignant neoplasm of trigone of urinary bladder (HCC)  09/12/2020 Initial Diagnosis   Malignant neoplasm of trigone of urinary bladder (HCC)   09/26/2020 - 10/31/2020 Chemotherapy   Patient is on Treatment Plan : BLADDER Gemcitabine q7d        Discussed the use of AI scribe software for clinical note transcription with the patient, who gave verbal consent to proceed.  History of Present Illness   The patient, an 83 year old with a history of bladder cancer, presents for a follow-up visit. He is currently receiving immunotherapy with Rande Lawman, which he started in August of the previous year. The patient reports significant fatigue, which has impacted his ability to engage in his usual activities, such as painting and fixing cars. He spends most of his day watching TV and has limited mobility. He denies any joint pain but does report feeling "lazy and sorry sometimes." The patient also has a history of back surgery and a bulging disc, which may be contributing to his symptoms. Despite the fatigue, the patient's bladder cancer appears to be responding well to the [facility], with a recent cystoscopy showing no evidence of disease.         All other systems were reviewed with the patient and are negative.  MEDICAL HISTORY:  Past Medical History:  Diagnosis Date   Bladder cancer (HCC)    Blind right eye    BPH (benign prostatic hypertrophy)    Cataracts, both eyes    Depression  DM type 2 (diabetes mellitus, type 2) (HCC)    DOE (dyspnea on exertion)    with heavy exertion   Full dentures    GERD (gastroesophageal reflux disease)    Glaucoma no peripheral or side vision   right eye blind   Glaucoma, right eye    HOH (hard of hearing)    Hyperlipidemia    Hypertension    Hypothyroidism    Local recurrence of cancer of urinary bladder [facility]) urologsit-  dr  Retta Diones   1st dx 01-29-2016--  now recurrent 05-31-2016   Lower extremity edema 07/11/2020   right swells more than left goes down at night after sleeping   Lower urinary tract symptoms (LUTS) 2019   Migraine    Peripheral neuropathy    middle finger left hand   Pneumonia 2019   Skin abnormality    base of spine looks like pimple has had since 07-01-2020, no drainage area intact   Wears glasses     SURGICAL HISTORY: Past Surgical History:  Procedure Laterality Date   ANTERIOR CERVICAL DECOMP/DISCECTOMY FUSION  08-15-2000   C5 -- C6   ANTERIOR CERVICAL DECOMP/DISCECTOMY FUSION  03-04-2008   C6 -- C7   areas removed from back at spine  2021 jan and aug 2021   CYSTOSCOPY W/ RETROGRADES Left 05/11/2020   Procedure: Cystoscopy with left retrograde ureteropyelogram, resection of probable bladder tumor and left trigonal/ureteral orifice region, 3 cm in size, fluoroscopic interpretation, left ureteroscopy, placement of 6 French by 26 cm contour double-J stent without tether;  Surgeon: Marcine Matar, MD;  Location: [facility];  Service: Urology;  Laterality: Left;   CYSTOSCOPY W/ RETROGRADES Bilateral 06/24/2022   Procedure: CYSTOSCOPY WITH RETROGRADE PYELOGRAM;  Surgeon: Marcine Matar, MD;  Location: [facility];  Service: Urology;  Laterality: Bilateral;  1 HR   CYSTOSCOPY WITH BIOPSY N/A 01/29/2016   Procedure: CYSTOSCOPY WITH BIOPSY;  Surgeon: Marcine Matar, MD;  Location: [facility];  Service: Urology;  Laterality: N/A;   CYSTOSCOPY WITH BIOPSY N/A 05/31/2016   Procedure: CYSTOSCOPY WITH BIOPSY;  Surgeon: Marcine Matar, MD;  Location: [facility];  Service: Urology;  Laterality: N/A;   CYSTOSCOPY WITH BIOPSY N/A 06/24/2022   Procedure: CYSTOSCOPY WITH BIOPSY;  Surgeon: Marcine Matar, MD;  Location: [facility];  Service: Urology;  Laterality: N/A;   EYE SURGERY Right 1998 & 2000   [place]   GLAUCOMA SURGERY  1998 and 2000    IR IMAGING GUIDED PORT INSERTION  03/31/2023   lower back  06/01/2019, 02-10-2020   nose cancer  areas removed  2019, 2021   x 2 / 3/ 2021 small area removed from nose   surgery for sacral fracture  40 yrs ago   TRANSURETHRAL RESECTION OF BLADDER TUMOR N/A 07/17/2020   Procedure: TRANSURETHRAL RESECTION OF BLADDER TUMOR (TURBT);  Surgeon: Marcine Matar, MD;  Location: [facility];  Service: Urology;  Laterality: N/A;    I have reviewed the social history and family history with the patient and they are unchanged from previous note.  ALLERGIES:  has no known allergies.  MEDICATIONS:  Current Outpatient Medications  Medication Sig Dispense Refill   acetaminophen (TYLENOL) 500 MG tablet Take 1,000 mg by mouth every 8 (eight) hours as needed for moderate pain.     amLODipine (NORVASC) 10 MG tablet Take 10 mg by mouth daily.     atorvastatin (LIPITOR) 20 MG tablet Take 20 mg by mouth at bedtime.  carteolol (OCUPRESS) 1 % ophthalmic solution Place 1 drop into the right eye every morning.     cetirizine (ZYRTEC) 10 MG tablet Take 10 mg by mouth daily as needed for allergies.     clotrimazole-betamethasone (LOTRISONE) lotion Apply 1 Application topically daily as needed (irritation).     fluticasone (FLONASE) 50 MCG/ACT nasal spray Place 2 sprays into both nostrils daily as needed for allergies.      glimepiride (AMARYL) 1 MG tablet Take 1 mg by mouth daily with breakfast.     ibuprofen (ADVIL) 200 MG tablet Take 600 mg by mouth every 8 (eight) hours as needed for moderate pain.     latanoprost (XALATAN) 0.005 % ophthalmic solution Place 1 drop into the right eye at bedtime.     levothyroxine (SYNTHROID, LEVOTHROID) 100 MCG tablet Take 100 mcg by mouth daily before breakfast.      lidocaine-prilocaine (EMLA) cream Apply 1 Application topically as needed. 30 g 0   losartan (COZAAR) 100 MG tablet Take 100 mg by mouth at bedtime.      meloxicam (MOBIC) 15 MG tablet Take 15 mg by  mouth daily.     Misc Natural Products (COMPLETE PROSTATE HEALTH PO) Take 1 tablet by mouth 2 (two) times daily.     omeprazole (PRILOSEC) 20 MG capsule Take 20 mg by mouth daily.     oxybutynin (DITROPAN-XL) 5 MG 24 hr tablet TAKE 1 TABLET (5 MG TOTAL) BY MOUTH ONCE A WEEK. TAKE 2 HOURS BEFORE SCHEDULED CHEMO IN OUR OFFICE 90 tablet 1   oxycodone (OXY-IR) 5 MG capsule Take 5 mg by mouth every 6 (six) hours as needed for pain.     sitaGLIPtin (JANUVIA) 100 MG tablet Take 100 mg by mouth every morning.     tamsulosin (FLOMAX) 0.4 MG CAPS capsule Take 0.4 mg by mouth every morning.      vitamin B-12 (CYANOCOBALAMIN) 500 MCG tablet Take 500 mcg by mouth daily.     No current facility-administered medications for this visit.    PHYSICAL EXAMINATION: ECOG PERFORMANCE STATUS: 1 - Symptomatic but completely ambulatory  Vitals:   09/02/23 1008 09/02/23 1009  BP: (!) 149/62 127/68  Pulse: 75   Resp: 16   Temp: 98.4 F (36.9 C)   SpO2: 100%    Wt Readings from Last 3 Encounters:  09/02/23 182 lb 9.6 oz (82.8 kg)  08/12/23 184 lb (83.5 kg)  07/22/23 184 lb 11.2 oz (83.8 kg)     GENERAL:alert, no distress and comfortable SKIN: skin color, texture, turgor are normal, no rashes or significant lesions EYES: normal, Conjunctiva are pink and non-injected, sclera clear NECK: supple, thyroid normal size, non-tender, without nodularity LYMPH:  no palpable lymphadenopathy in the cervical, axillary  LUNGS: clear to auscultation and percussion with normal breathing effort HEART: regular rate & rhythm and no murmurs and no lower extremity edema ABDOMEN:abdomen soft, non-tender and normal bowel sounds Musculoskeletal:no cyanosis of digits and no clubbing  NEURO: alert & oriented x 3 with fluent speech, no focal motor/sensory deficits    LABORATORY DATA:  I have reviewed the data as listed    Latest Ref Rng & Units 09/02/2023    9:51 AM 08/12/2023    8:39 AM 07/22/2023    9:34 AM  CBC  WBC  4.0 - 10.5 K/uL 8.3  5.5  5.7   Hemoglobin 13.0 - 17.0 g/dL 36.6  44.0  34.7   Hematocrit 39.0 - 52.0 % 38.0  35.5  35.1  Platelets 150 - 400 K/uL 196  183  169         Latest Ref Rng & Units 08/12/2023    8:39 AM 07/22/2023    9:34 AM 07/01/2023    1:06 PM  CMP  Glucose 70 - 99 mg/dL 563  875  643   BUN 8 - 23 mg/dL 18  14  12    Creatinine 0.61 - 1.24 mg/dL 3.29  5.18  8.41   Sodium 135 - 145 mmol/L 139  139  140   Potassium 3.5 - 5.1 mmol/L 3.6  3.7  3.8   Chloride 98 - 111 mmol/L 107  105  107   CO2 22 - 32 mmol/L 25  28  29    Calcium 8.9 - 10.3 mg/dL 9.0  9.2  9.5   Total Protein 6.5 - 8.1 g/dL 7.0  7.0  7.1   Total Bilirubin 0.0 - 1.2 mg/dL 0.4  0.4  0.4   Alkaline Phos 38 - 126 U/L 51  50  51   AST 15 - 41 U/L 18  17  18    ALT 0 - 44 U/L 18  18  17        RADIOGRAPHIC STUDIES: I have personally reviewed the radiological images as listed and agreed with the findings in the report. No results found.    Orders Placed This Encounter  Procedures   CBC with Differential (Cancer Center Only)    Standing Status:   Future    Expected Date:   09/23/2023    Expiration Date:   09/22/2024   CMP (Cancer Center only)    Standing Status:   Future    Expected Date:   09/23/2023    Expiration Date:   09/22/2024   CBC with Differential (Cancer Center Only)    Standing Status:   Future    Expected Date:   10/14/2023    Expiration Date:   10/13/2024   CMP (Cancer Center only)    Standing Status:   Future    Expected Date:   10/14/2023    Expiration Date:   10/13/2024   CBC with Differential (Cancer Center Only)    Standing Status:   Future    Expected Date:   11/04/2023    Expiration Date:   11/03/2024   CMP (Cancer Center only)    Standing Status:   Future    Expected Date:   11/04/2023    Expiration Date:   11/03/2024   T4    Standing Status:   Future    Expected Date:   11/04/2023    Expiration Date:   11/03/2024   TSH    Standing Status:   Future    Expected Date:   11/04/2023     Expiration Date:   11/03/2024   All questions were answered. The patient knows to call the clinic with any problems, questions or concerns. No barriers to learning was detected. The total time spent in the appointment was 25 minutes.     Malachy Mood, MD 09/02/2023

## 2023-09-03 LAB — T4: T4, Total: 11.4 ug/dL (ref 4.5–12.0)

## 2023-09-04 ENCOUNTER — Other Ambulatory Visit: Payer: Self-pay

## 2023-09-10 ENCOUNTER — Other Ambulatory Visit: Payer: Self-pay

## 2023-09-16 ENCOUNTER — Encounter: Payer: Self-pay | Admitting: Hematology

## 2023-09-18 ENCOUNTER — Telehealth: Payer: Self-pay

## 2023-09-18 NOTE — Telephone Encounter (Signed)
 Left VM to schedule diabetic shoe pick up

## 2023-09-22 ENCOUNTER — Ambulatory Visit (INDEPENDENT_AMBULATORY_CARE_PROVIDER_SITE_OTHER): Payer: HMO

## 2023-09-22 ENCOUNTER — Encounter: Payer: Self-pay | Admitting: Hematology

## 2023-09-22 DIAGNOSIS — M2041 Other hammer toe(s) (acquired), right foot: Secondary | ICD-10-CM

## 2023-09-22 DIAGNOSIS — M2141 Flat foot [pes planus] (acquired), right foot: Secondary | ICD-10-CM

## 2023-09-22 DIAGNOSIS — M2142 Flat foot [pes planus] (acquired), left foot: Secondary | ICD-10-CM | POA: Diagnosis not present

## 2023-09-22 DIAGNOSIS — M2042 Other hammer toe(s) (acquired), left foot: Secondary | ICD-10-CM

## 2023-09-22 DIAGNOSIS — E1149 Type 2 diabetes mellitus with other diabetic neurological complication: Secondary | ICD-10-CM

## 2023-09-22 NOTE — Progress Notes (Signed)

## 2023-09-22 NOTE — Assessment & Plan Note (Signed)
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 He received Intravesicular gemcitabine with a total of 2000 mg weekly X6 started in February 2022  -bladder biopsies, bladder washings on 06/24/2022 showed focal urothelial carcinoma in situ (CIS) and cytology was positive for high grade urothelial carcinoma -I recommended intravesical gemcitabine 2000 mg weekly x 6, he started on 08/28/16.  He tolerated first cycle poorly with significant hematuria, and intermittent fever for week after treatment. -Due to his poor tolerance to intravesical gemcitabine, I changed therapy to intravesical mitomycin 40 mg every week for 5 weeks. He tolerated the treatment very well  -His surveillance cystoscopy and biopsy were negative for residual cancer -Given his good response to intravesical mitomycin, and his high risk of recurrence, Dr. Retta Diones recommend small intravesical therapy.  Patient agrees to proceed, started in in April 2024. Unfortunately he has had recurrent leakage from the intravesical therapy, and not able to continue. -Treatment was switched to [facility] on March 18, 2023, he tolerated first cycle well.  Plan to continue every 3 weeks for up to 2 years if no recurrence.

## 2023-09-23 ENCOUNTER — Inpatient Hospital Stay: Payer: Self-pay | Admitting: Hematology

## 2023-09-23 ENCOUNTER — Encounter: Payer: Self-pay | Admitting: Hematology

## 2023-09-23 ENCOUNTER — Inpatient Hospital Stay: Payer: Self-pay

## 2023-09-23 ENCOUNTER — Inpatient Hospital Stay: Payer: PPO | Attending: Hematology

## 2023-09-23 VITALS — BP 125/56 | HR 70 | Temp 98.0°F | Resp 17 | Wt 174.9 lb

## 2023-09-23 DIAGNOSIS — Z79620 Long term (current) use of immunosuppressive biologic: Secondary | ICD-10-CM | POA: Insufficient documentation

## 2023-09-23 DIAGNOSIS — C671 Malignant neoplasm of dome of bladder: Secondary | ICD-10-CM

## 2023-09-23 DIAGNOSIS — C679 Malignant neoplasm of bladder, unspecified: Secondary | ICD-10-CM

## 2023-09-23 DIAGNOSIS — C670 Malignant neoplasm of trigone of bladder: Secondary | ICD-10-CM | POA: Insufficient documentation

## 2023-09-23 DIAGNOSIS — Z95828 Presence of other vascular implants and grafts: Secondary | ICD-10-CM

## 2023-09-23 DIAGNOSIS — Z5112 Encounter for antineoplastic immunotherapy: Secondary | ICD-10-CM | POA: Insufficient documentation

## 2023-09-23 DIAGNOSIS — R5383 Other fatigue: Secondary | ICD-10-CM

## 2023-09-23 LAB — CMP (CANCER CENTER ONLY)
ALT: 20 U/L (ref 0–44)
AST: 15 U/L (ref 15–41)
Albumin: 4.1 g/dL (ref 3.5–5.0)
Alkaline Phosphatase: 53 U/L (ref 38–126)
Anion gap: 5 (ref 5–15)
BUN: 19 mg/dL (ref 8–23)
CO2: 29 mmol/L (ref 22–32)
Calcium: 9.3 mg/dL (ref 8.9–10.3)
Chloride: 104 mmol/L (ref 98–111)
Creatinine: 0.97 mg/dL (ref 0.61–1.24)
GFR, Estimated: >60 mL/min (ref >60)
Glucose, Bld: 228 mg/dL — ABNORMAL HIGH (ref 70–99)
Potassium: 4.1 mmol/L (ref 3.5–5.1)
Sodium: 138 mmol/L (ref 135–145)
Total Bilirubin: 0.5 mg/dL (ref 0.0–1.2)
Total Protein: 6.8 g/dL (ref 6.5–8.1)

## 2023-09-23 LAB — CBC WITH DIFFERENTIAL (CANCER CENTER ONLY)
Abs Immature Granulocytes: 0.04 10*3/uL (ref 0.00–0.07)
Basophils Absolute: 0 10*3/uL (ref 0.0–0.1)
Basophils Relative: 1 %
Eosinophils Absolute: 0.3 10*3/uL (ref 0.0–0.5)
Eosinophils Relative: 4 %
HCT: 37.6 % — ABNORMAL LOW (ref 39.0–52.0)
Hemoglobin: 12.8 g/dL — ABNORMAL LOW (ref 13.0–17.0)
Immature Granulocytes: 1 %
Lymphocytes Relative: 18 %
Lymphs Abs: 1.3 10*3/uL (ref 0.7–4.0)
MCH: 31.1 pg (ref 26.0–34.0)
MCHC: 34 g/dL (ref 30.0–36.0)
MCV: 91.5 fL (ref 80.0–100.0)
Monocytes Absolute: 0.5 10*3/uL (ref 0.1–1.0)
Monocytes Relative: 7 %
Neutro Abs: 5.1 10*3/uL (ref 1.7–7.7)
Neutrophils Relative %: 69 %
Platelet Count: 159 10*3/uL (ref 150–400)
RBC: 4.11 MIL/uL — ABNORMAL LOW (ref 4.22–5.81)
RDW: 12.4 % (ref 11.5–15.5)
WBC Count: 7.3 10*3/uL (ref 4.0–10.5)
nRBC: 0 % (ref 0.0–0.2)

## 2023-09-23 LAB — T4, FREE: Free T4: 1.13 ng/dL — ABNORMAL HIGH (ref 0.61–1.12)

## 2023-09-23 LAB — TSH: TSH: 0.991 u[IU]/mL (ref 0.350–4.500)

## 2023-09-23 MED ORDER — SODIUM CHLORIDE 0.9 % IV SOLN
200.0000 mg | Freq: Once | INTRAVENOUS | Status: AC
  Administered 2023-09-23: 200 mg via INTRAVENOUS
  Filled 2023-09-23: qty 8

## 2023-09-23 MED ORDER — SODIUM CHLORIDE 0.9% FLUSH
10.0000 mL | INTRAVENOUS | Status: DC | PRN
  Administered 2023-09-23: 10 mL

## 2023-09-23 MED ORDER — SODIUM CHLORIDE 0.9 % IV SOLN: Freq: Once | INTRAVENOUS | Status: AC

## 2023-09-23 MED ORDER — HEPARIN SOD (PORK) LOCK FLUSH 100 UNIT/ML IV SOLN
500.0000 [IU] | Freq: Once | INTRAVENOUS | Status: AC | PRN
  Administered 2023-09-23: 500 [IU]

## 2023-09-23 NOTE — Patient Instructions (Signed)
[facility] - A DEPT OF [facility]  Discharge Instructions: Thank you for choosing [facility] to provide your oncology and hematology care.   If you have a lab appointment with the Cancer Center, please go directly to the Cancer Center and check in at the registration area.   Wear comfortable clothing and clothing appropriate for easy access to any Portacath or PICC line.   We strive to give you quality time with your provider. You may need to reschedule your appointment if you arrive late (15 or more minutes).  Arriving late affects you and other patients whose appointments are after yours.  Also, if you miss three or more appointments without notifying the office, you may be dismissed from the clinic at the provider's discretion.      For prescription refill requests, have your pharmacy contact our office and allow 72 hours for refills to be completed.    Today you received the following chemotherapy and/or immunotherapy agents: Pembrolizumab [facility])       To help prevent nausea and vomiting after your treatment, we encourage you to take your nausea medication as directed.  BELOW ARE SYMPTOMS THAT SHOULD BE REPORTED IMMEDIATELY: *FEVER GREATER THAN 100.4 F (38 C) OR HIGHER *CHILLS OR SWEATING *NAUSEA AND VOMITING THAT IS NOT CONTROLLED WITH YOUR NAUSEA MEDICATION *UNUSUAL SHORTNESS OF BREATH *UNUSUAL BRUISING OR BLEEDING *URINARY PROBLEMS (pain or burning when urinating, or frequent urination) *BOWEL PROBLEMS (unusual diarrhea, constipation, pain near the anus) TENDERNESS IN MOUTH AND THROAT WITH OR WITHOUT PRESENCE OF ULCERS (sore throat, sores in mouth, or a toothache) UNUSUAL RASH, SWELLING OR PAIN  UNUSUAL VAGINAL DISCHARGE OR ITCHING   Items with * indicate a potential emergency and should be followed up as soon as possible or go to the Emergency Department if any problems should occur.  Please show the CHEMOTHERAPY ALERT CARD  or IMMUNOTHERAPY ALERT CARD at check-in to the Emergency Department and triage nurse.  Should you have questions after your visit or need to cancel or reschedule your appointment, please contact [facility] - A DEPT OF [facility]  Dept: [phone]  and follow the prompts.  Office hours are 8:00 a.m. to 4:30 p.m. Monday - Friday. Please note that voicemails left after 4:00 p.m. may not be returned until the following business day.  We are closed weekends and major holidays. You have access to a nurse at all times for urgent questions. Please call the main number to the clinic Dept: [phone] and follow the prompts.   For any non-urgent questions, you may also contact your provider using MyChart. We now offer e-Visits for anyone 33 and older to request care online for non-urgent symptoms. For details visit [email or web address].   Also download the MyChart app! Go to the app store, search "MyChart", open the app, select [place], and log in with your MyChart username and password.

## 2023-09-23 NOTE — Progress Notes (Signed)
[facility]   [phone] [phone]   Clinic Follow up Note   Patient Care Team: Charlane Ferretti, DO as PCP - General (Internal Medicine) Malachy Mood, MD as Attending Physician (Hematology and Oncology) Marcine Matar, MD as Consulting Physician (Urology)  Date of Service:  09/23/2023  CHIEF COMPLAINT: f/u of bladder cancer  CURRENT THERAPY:  Due to every 3 weeks  Oncology History   Bladder cancer [facility]) -initially diagnosed in 2017  -He was found to have high-grade urothelial carcinoma without muscle invasion and developed and subsequently BCG refractory disease in December 2021. He received Intravesicular gemcitabine with a total of 2000 mg weekly X6 started in February 2022  -bladder biopsies, bladder washings on 06/24/2022 showed focal urothelial carcinoma in situ (CIS) and cytology was positive for high grade urothelial carcinoma -I recommended intravesical gemcitabine 2000 mg weekly x 6, he started on 08/28/16.  He tolerated first cycle poorly with significant hematuria, and intermittent fever for week after treatment. -Due to his poor tolerance to intravesical gemcitabine, I changed therapy to intravesical mitomycin 40 mg every week for 5 weeks. He tolerated the treatment very well  -His surveillance cystoscopy and biopsy were negative for residual cancer -Given his good response to intravesical mitomycin, and his high risk of recurrence, Dr. Retta Diones recommend small intravesical therapy.  Patient agrees to proceed, started in in April 2024. Unfortunately he has had recurrent leakage from the intravesical therapy, and not able to continue. -Treatment was switched to [facility] on March 18, 2023, he tolerated first cycle well.  Plan to continue every 3 weeks for up to 2 years if no recurrence.   Assessment and Plan    Bladder cancer [age]-year-old male undergoing Keytruda treatment. No significant side effects from recent infusions, though he  reports general malaise, fatigue, and a 10-pound weight loss likely due to inadequate protein intake. Bloating may be related to hydration status.  -Lab reviewed, adequate for treatment, will proceed Keytruda today - Encourage increased protein intake to maintain weight - Monitor for side effects and overall response to treatment  Tremors Significant tremors impacting daily activities and quality of life, started after intravesical chemo for bladder cancer.  Thyroid function tests (TSH and free T4) are normal, suggesting non-thyroidal cause. Possible link to [facility]. Discussed referral to neurologist for further evaluation. - Refer to neurologist Dr. Wadie Lessen for further evaluation - Consider potential link to [facility] if no other cause is identified   Dry Cough Persistent dry cough with no clear etiology identified. - Encourage hydration  Muscle Cramps Frequent muscle cramps in hands, legs, and feet, likely related to hydration and electrolyte imbalance. - Encourage increased water intake, especially electrolyzed water like Gatorade or Pedialyte - Recommend over-the-counter magnesium or multivitamin with minerals - Consider tonic water for relief  General Health Maintenance Lab results, including thyroid function, blood counts, kidney, and liver function, are within normal limits. No severe joint pain or other significant symptoms reported. - Continue regular monitoring of lab results - Encourage multivitamin intake for overall health  Plan -Will proceed Keytruda today and continue every 3 weeks -Will refer him to neurologist Dr. Barbaraann Cao for tremor - Proceed with next cystoscopy in April -Lab, follow-up and treatment in 3 weeks        SUMMARY OF ONCOLOGIC HISTORY: Oncology History Overview Note   Cancer Staging  Bladder cancer [facility]) Staging form: Urinary Bladder, AJCC 8th Edition - Clinical: Stage I (cT1, cN0, cM0) - Signed by Benjiman Core, MD on 09/12/2020  WHO/ISUP grade  (low/high): High Grade Histologic grading system: 2 grade system - Pathologic stage from 06/24/2022: Stage 0is (pTis, pN0, cM0) - Signed by Malachy Mood, MD on 08/24/2022 Stage prefix: Initial diagnosis WHO/ISUP grade (low/high): High Grade Histologic grading system: 2 grade system     Bladder cancer (HCC)  09/12/2020 Initial Diagnosis   Bladder cancer (HCC)   09/12/2020 Cancer Staging   Staging form: Urinary Bladder, AJCC 8th Edition - Clinical: Stage I (cT1, cN0, cM0) - Signed by Benjiman Core, MD on 09/12/2020 WHO/ISUP grade (low/high): High Grade Histologic grading system: 2 grade system   06/24/2022 Pathology Results             FINAL MICROSCOPIC DIAGNOSIS:  A. BLADDER, BIOPSY: Focal urothelial carcinoma in situ (CIS)     06/24/2022 Cancer Staging   Staging form: Urinary Bladder, AJCC 8th Edition - Pathologic stage from 06/24/2022: Stage 0is (pTis, pN0, cM0) - Signed by Malachy Mood, MD on 08/24/2022 Stage prefix: Initial diagnosis WHO/ISUP grade (low/high): High Grade Histologic grading system: 2 grade system   08/28/2022 - 08/28/2022 Chemotherapy   Patient is on Treatment Plan : BLADDER Gemcitabine INTRAVESICAL (2000) q7d     09/12/2022 - 02/26/2023 Chemotherapy   Patient is on Treatment Plan : BLADDER Mitomycin INTRAVESICAL (40 mg) q7d     10/25/2022 Genetic Testing   Negative [facility] CancerNext-Expanded +RNA Panel.  Report date is 10/25/2022.   The CancerNext-Expanded gene panel offered by [facility] and includes sequencing, rearrangement, and RNA analysis for the following 77 genes: AIP, ALK, APC, ATM, AXIN2, BAP1, BARD1, BLM, BMPR1A, BRCA1, BRCA2, BRIP1, CDC73, CDH1, CDK4, CDKN1B, CDKN2A, CHEK2, CTNNA1, DICER1, FANCC, FH, FLCN, GALNT12, KIF1B, LZTR1, MAX, MEN1, MET, MLH1, MSH2, MSH3, MSH6, MUTYH, NBN, NF1, NF2, NTHL1, PALB2, PHOX2B, PMS2, POT1, PRKAR1A, PTCH1, PTEN, RAD51C, RAD51D, RB1, RECQL, RET, SDHA, SDHAF2, SDHB, SDHC, SDHD, SMAD4, SMARCA4, SMARCB1, SMARCE1, STK11,  SUFU, TMEM127, TP53, TSC1, TSC2, VHL and XRCC2 (sequencing and deletion/duplication); EGFR, EGLN1, HOXB13, KIT, MITF, PDGFRA, POLD1, and POLE (sequencing only); EPCAM and GREM1 (deletion/duplication only).    03/18/2023 -  Chemotherapy   Patient is on Treatment Plan : BLADDER Pembrolizumab (200) q21d     Malignant neoplasm of trigone of urinary bladder (HCC)  09/12/2020 Initial Diagnosis   Malignant neoplasm of trigone of urinary bladder (HCC)   09/26/2020 - 10/31/2020 Chemotherapy   Patient is on Treatment Plan : BLADDER Gemcitabine q7d        Discussed the use of AI scribe software for clinical note transcription with the patient, who gave verbal consent to proceed.  History of Present Illness   The patient, an 83 year old with a history of bladder cancer and currently on Keytruda for blood cancer, presents with shaking and weight loss. He reports feeling 'worse than crap' after infusions and experiencing dryness in his mouth. He has had recurrent episodes of oral thrush and a rash around the penis, both of which have been treated. He also reports a dry cough and frequent cramping in his hands, legs, and feet. He has lost about 10 pounds and has had to stop activities he used to enjoy, such as painting and car work, due to the shaking. He also reports a sensation of internal shaking and occasional bloating.         All other systems were reviewed with the patient and are negative.  MEDICAL HISTORY:  Past Medical History:  Diagnosis Date   Bladder cancer (HCC)    Blind right eye    BPH (  benign prostatic hypertrophy)    Cataracts, both eyes    Depression    DM type 2 (diabetes mellitus, type 2) (HCC)    DOE (dyspnea on exertion)    with heavy exertion   Full dentures    GERD (gastroesophageal reflux disease)    Glaucoma no peripheral or side vision   right eye blind   Glaucoma, right eye    HOH (hard of hearing)    Hyperlipidemia    Hypertension    Hypothyroidism    Local  recurrence of cancer of urinary bladder [facility]) urologsit-  dr Retta Diones   1st dx 01-29-2016--  now recurrent 05-31-2016   Lower extremity edema 07/11/2020   right swells more than left goes down at night after sleeping   Lower urinary tract symptoms (LUTS) 2019   Migraine    Peripheral neuropathy    middle finger left hand   Pneumonia 2019   Skin abnormality    base of spine looks like pimple has had since 07-01-2020, no drainage area intact   Wears glasses     SURGICAL HISTORY: Past Surgical History:  Procedure Laterality Date   ANTERIOR CERVICAL DECOMP/DISCECTOMY FUSION  08-15-2000   C5 -- C6   ANTERIOR CERVICAL DECOMP/DISCECTOMY FUSION  03-04-2008   C6 -- C7   areas removed from back at spine  2021 jan and aug 2021   CYSTOSCOPY W/ RETROGRADES Left 05/11/2020   Procedure: Cystoscopy with left retrograde ureteropyelogram, resection of probable bladder tumor and left trigonal/ureteral orifice region, 3 cm in size, fluoroscopic interpretation, left ureteroscopy, placement of 6 French by 26 cm contour double-J stent without tether;  Surgeon: Marcine Matar, MD;  Location: [facility];  Service: Urology;  Laterality: Left;   CYSTOSCOPY W/ RETROGRADES Bilateral 06/24/2022   Procedure: CYSTOSCOPY WITH RETROGRADE PYELOGRAM;  Surgeon: Marcine Matar, MD;  Location: [facility];  Service: Urology;  Laterality: Bilateral;  1 HR   CYSTOSCOPY WITH BIOPSY N/A 01/29/2016   Procedure: CYSTOSCOPY WITH BIOPSY;  Surgeon: Marcine Matar, MD;  Location[facility];  Service: Urology;  Laterality: N/A;   CYSTOSCOPY WITH BIOPSY N/A 05/31/2016   Procedure: CYSTOSCOPY WITH BIOPSY;  Surgeon: Marcine Matar, MD;  Location: [facility];  Service: Urology;  Laterality: N/A;   CYSTOSCOPY WITH BIOPSY N/A 06/24/2022   Procedure: CYSTOSCOPY WITH BIOPSY;  Surgeon: Marcine Matar, MD;  Location: [facility];  Service: Urology;  Laterality: N/A;   EYE SURGERY Right  1998 & 2000   [place]   GLAUCOMA SURGERY  1998 and 2000   IR IMAGING GUIDED PORT INSERTION  03/31/2023   lower back  06/01/2019, 02-10-2020   nose cancer  areas removed  2019, 2021   x 2 / 3/ 2021 small area removed from nose   surgery for sacral fracture  40 yrs ago   TRANSURETHRAL RESECTION OF BLADDER TUMOR N/A 07/17/2020   Procedure: TRANSURETHRAL RESECTION OF BLADDER TUMOR (TURBT);  Surgeon: Marcine Matar, MD;  Location: [facility];  Service: Urology;  Laterality: N/A;    I have reviewed the social history and family history with the patient and they are unchanged from previous note.  ALLERGIES:  has no known allergies.  MEDICATIONS:  Current Outpatient Medications  Medication Sig Dispense Refill   acetaminophen (TYLENOL) 500 MG tablet Take 1,000 mg by mouth every 8 (eight) hours as needed for moderate pain.     amLODipine (NORVASC) 10 MG tablet Take 10 mg by mouth daily.  atorvastatin (LIPITOR) 20 MG tablet Take 20 mg by mouth at bedtime.      carteolol (OCUPRESS) 1 % ophthalmic solution Place 1 drop into the right eye every morning.     cetirizine (ZYRTEC) 10 MG tablet Take 10 mg by mouth daily as needed for allergies.     clotrimazole-betamethasone (LOTRISONE) lotion Apply 1 Application topically daily as needed (irritation).     fluticasone (FLONASE) 50 MCG/ACT nasal spray Place 2 sprays into both nostrils daily as needed for allergies.      glimepiride (AMARYL) 1 MG tablet Take 1 mg by mouth daily with breakfast.     ibuprofen (ADVIL) 200 MG tablet Take 600 mg by mouth every 8 (eight) hours as needed for moderate pain.     latanoprost (XALATAN) 0.005 % ophthalmic solution Place 1 drop into the right eye at bedtime.     levothyroxine (SYNTHROID, LEVOTHROID) 100 MCG tablet Take 100 mcg by mouth daily before breakfast.      lidocaine-prilocaine (EMLA) cream Apply 1 Application topically as needed. 30 g 0   losartan (COZAAR) 100 MG tablet Take 100 mg by mouth  at bedtime.      meloxicam (MOBIC) 15 MG tablet Take 15 mg by mouth daily.     Misc Natural Products (COMPLETE PROSTATE HEALTH PO) Take 1 tablet by mouth 2 (two) times daily.     omeprazole (PRILOSEC) 20 MG capsule Take 20 mg by mouth daily.     oxybutynin (DITROPAN-XL) 5 MG 24 hr tablet TAKE 1 TABLET (5 MG TOTAL) BY MOUTH ONCE A WEEK. TAKE 2 HOURS BEFORE SCHEDULED CHEMO IN OUR OFFICE 90 tablet 1   oxycodone (OXY-IR) 5 MG capsule Take 5 mg by mouth every 6 (six) hours as needed for pain.     sitaGLIPtin (JANUVIA) 100 MG tablet Take 100 mg by mouth every morning.     tamsulosin (FLOMAX) 0.4 MG CAPS capsule Take 0.4 mg by mouth every morning.      vitamin B-12 (CYANOCOBALAMIN) 500 MCG tablet Take 500 mcg by mouth daily.     No current facility-administered medications for this visit.   Facility-Administered Medications Ordered in Other Visits  Medication Dose Route Frequency Provider Last Rate Last Admin   sodium chloride flush (NS) 0.9 % injection 10 mL  10 mL Intracatheter PRN Malachy Mood, MD   10 mL at 09/23/23 1315    PHYSICAL EXAMINATION: ECOG PERFORMANCE STATUS: 1 - Symptomatic but completely ambulatory  Vitals:   09/23/23 1130  BP: (!) 125/56  Pulse: 70  Resp: 17  Temp: 98 F (36.7 C)  SpO2: 98%   Wt Readings from Last 3 Encounters:  09/23/23 79.3 kg  09/02/23 82.8 kg  08/12/23 83.5 kg     GENERAL:alert, no distress and comfortable SKIN: skin color, texture, turgor are normal, no rashes or significant lesions EYES: normal, Conjunctiva are pink and non-injected, sclera clear NECK: supple, thyroid normal size, non-tender, without nodularity LYMPH:  no palpable lymphadenopathy in the cervical, axillary  LUNGS: clear to auscultation and percussion with normal breathing effort HEART: regular rate & rhythm and no murmurs and no lower extremity edema ABDOMEN:abdomen soft, non-tender and normal bowel sounds Musculoskeletal:no cyanosis of digits and no clubbing  NEURO: alert &  oriented x 3 with fluent speech, no focal motor/sensory deficits    LABORATORY DATA:  I have reviewed the data as listed    Latest Ref Rng & Units 09/23/2023   10:47 AM 09/02/2023    9:51 AM 08/12/2023  8:39 AM  CBC  WBC 4.0 - 10.5 K/uL 7.3  8.3  5.5   Hemoglobin 13.0 - 17.0 g/dL 16.1  09.6  04.5   Hematocrit 39.0 - 52.0 % 37.6  38.0  35.5   Platelets 150 - 400 K/uL 159  196  183         Latest Ref Rng & Units 09/23/2023   10:47 AM 09/02/2023    9:51 AM 08/12/2023    8:39 AM  CMP  Glucose 70 - 99 mg/dL 409  811  914   BUN 8 - 23 mg/dL 19  21  18    Creatinine 0.61 - 1.24 mg/dL 7.82  9.56  2.13   Sodium 135 - 145 mmol/L 138  137  139   Potassium 3.5 - 5.1 mmol/L 4.1  3.8  3.6   Chloride 98 - 111 mmol/L 104  101  107   CO2 22 - 32 mmol/L 29  30  25    Calcium 8.9 - 10.3 mg/dL 9.3  9.8  9.0   Total Protein 6.5 - 8.1 g/dL 6.8  7.0  7.0   Total Bilirubin 0.0 - 1.2 mg/dL 0.5  0.5  0.4   Alkaline Phos 38 - 126 U/L 53  52  51   AST 15 - 41 U/L 15  16  18    ALT 0 - 44 U/L 20  26  18        RADIOGRAPHIC STUDIES: I have personally reviewed the radiological images as listed and agreed with the findings in the report. No results found.    No orders of the defined types were placed in this encounter.  All questions were answered. The patient knows to call the clinic with any problems, questions or concerns. No barriers to learning was detected. The total time spent in the appointment was 25 minutes.     Malachy Mood, MD 09/23/2023

## 2023-09-24 NOTE — Progress Notes (Signed)
Forwarded referral message from Dr. Mosetta Putt to Dr. Barbaraann Cao for this patient to be scheduled with him as soon as possible. Sent message to scheduler to have this done.

## 2023-09-25 ENCOUNTER — Telehealth: Payer: Self-pay | Admitting: Internal Medicine

## 2023-09-25 NOTE — Telephone Encounter (Signed)
Marland Kitchen

## 2023-09-30 ENCOUNTER — Other Ambulatory Visit: Payer: Self-pay

## 2023-09-30 ENCOUNTER — Ambulatory Visit: Payer: PPO | Admitting: Internal Medicine

## 2023-10-02 ENCOUNTER — Inpatient Hospital Stay: Payer: PPO | Admitting: Internal Medicine

## 2023-10-02 ENCOUNTER — Telehealth: Payer: Self-pay | Admitting: Internal Medicine

## 2023-10-02 ENCOUNTER — Telehealth: Payer: Self-pay | Admitting: *Deleted

## 2023-10-02 NOTE — Telephone Encounter (Signed)
PC to patient regarding appointment this morning, he states he called earlier to try & cancel due to the weather.  Informed patient scheduling will contact him to reschedule, he verbalizes understanding.  Scheduling message sent.

## 2023-10-02 NOTE — Telephone Encounter (Signed)
Patient's spouse is aware of rescheduled appointment times/dates for visit with Dr. Barbaraann Cao

## 2023-10-06 ENCOUNTER — Telehealth: Payer: Self-pay

## 2023-10-06 NOTE — Telephone Encounter (Signed)
 Auth number [id number] Start date 09/22/23-12/21/23

## 2023-10-13 NOTE — Assessment & Plan Note (Signed)
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 He received Intravesicular gemcitabine with a total of 2000 mg weekly X6 started in February 2022  -bladder biopsies, bladder washings on 06/24/2022 showed focal urothelial carcinoma in situ (CIS) and cytology was positive for high grade urothelial carcinoma -I recommended intravesical gemcitabine 2000 mg weekly x 6, he started on 08/28/16.  He tolerated first cycle poorly with significant hematuria, and intermittent fever for week after treatment. -Due to his poor tolerance to intravesical gemcitabine, I changed therapy to intravesical mitomycin 40 mg every week for 5 weeks. He tolerated the treatment very well  -His surveillance cystoscopy and biopsy were negative for residual cancer -Given his good response to intravesical mitomycin, and his high risk of recurrence, Dr. Retta Diones recommend small intravesical therapy.  Patient agrees to proceed, started in in April 2024. Unfortunately he has had recurrent leakage from the intravesical therapy, and not able to continue. -Treatment was switched to [facility] on March 18, 2023, he has been tolerating well overall.  Plan to continue every 3 weeks for up to 2 years if no recurrence.

## 2023-10-14 ENCOUNTER — Inpatient Hospital Stay (HOSPITAL_BASED_OUTPATIENT_CLINIC_OR_DEPARTMENT_OTHER): Payer: PPO | Admitting: Hematology

## 2023-10-14 ENCOUNTER — Ambulatory Visit (HOSPITAL_COMMUNITY)
Admission: RE | Admit: 2023-10-14 | Discharge: 2023-10-14 | Disposition: A | Discharge status: Home / Self Care | Source: Ambulatory Visit | Attending: Hematology | Admitting: Hematology

## 2023-10-14 ENCOUNTER — Other Ambulatory Visit: Payer: Self-pay

## 2023-10-14 ENCOUNTER — Inpatient Hospital Stay

## 2023-10-14 ENCOUNTER — Inpatient Hospital Stay: Payer: PPO

## 2023-10-14 ENCOUNTER — Inpatient Hospital Stay: Payer: PPO | Attending: Hematology

## 2023-10-14 ENCOUNTER — Telehealth: Payer: Self-pay

## 2023-10-14 VITALS — BP 120/61 | HR 80 | Temp 98.5°F | Resp 16 | Wt 174.0 lb

## 2023-10-14 DIAGNOSIS — R509 Fever, unspecified: Secondary | ICD-10-CM | POA: Diagnosis not present

## 2023-10-14 DIAGNOSIS — R051 Acute cough: Secondary | ICD-10-CM | POA: Insufficient documentation

## 2023-10-14 DIAGNOSIS — Z79620 Long term (current) use of immunosuppressive biologic: Secondary | ICD-10-CM | POA: Insufficient documentation

## 2023-10-14 DIAGNOSIS — C670 Malignant neoplasm of trigone of bladder: Secondary | ICD-10-CM | POA: Diagnosis not present

## 2023-10-14 DIAGNOSIS — Z95828 Presence of other vascular implants and grafts: Secondary | ICD-10-CM

## 2023-10-14 DIAGNOSIS — Z5112 Encounter for antineoplastic immunotherapy: Secondary | ICD-10-CM | POA: Diagnosis not present

## 2023-10-14 DIAGNOSIS — Z79899 Other long term (current) drug therapy: Secondary | ICD-10-CM | POA: Diagnosis not present

## 2023-10-14 DIAGNOSIS — Z981 Arthrodesis status: Secondary | ICD-10-CM | POA: Diagnosis not present

## 2023-10-14 DIAGNOSIS — C671 Malignant neoplasm of dome of bladder: Secondary | ICD-10-CM

## 2023-10-14 DIAGNOSIS — C679 Malignant neoplasm of bladder, unspecified: Secondary | ICD-10-CM

## 2023-10-14 DIAGNOSIS — R059 Cough, unspecified: Secondary | ICD-10-CM | POA: Diagnosis not present

## 2023-10-14 DIAGNOSIS — Z452 Encounter for adjustment and management of vascular access device: Secondary | ICD-10-CM | POA: Diagnosis not present

## 2023-10-14 LAB — CBC WITH DIFFERENTIAL (CANCER CENTER ONLY)
Abs Immature Granulocytes: 0.05 10*3/uL (ref 0.00–0.07)
Basophils Absolute: 0.1 10*3/uL (ref 0.0–0.1)
Basophils Relative: 1 %
Eosinophils Absolute: 0.3 10*3/uL (ref 0.0–0.5)
Eosinophils Relative: 4 %
HCT: 36.1 % — ABNORMAL LOW (ref 39.0–52.0)
Hemoglobin: 12.3 g/dL — ABNORMAL LOW (ref 13.0–17.0)
Immature Granulocytes: 1 %
Lymphocytes Relative: 18 %
Lymphs Abs: 1.3 10*3/uL (ref 0.7–4.0)
MCH: 31.2 pg (ref 26.0–34.0)
MCHC: 34.1 g/dL (ref 30.0–36.0)
MCV: 91.6 fL (ref 80.0–100.0)
Monocytes Absolute: 0.5 10*3/uL (ref 0.1–1.0)
Monocytes Relative: 7 %
Neutro Abs: 5.1 10*3/uL (ref 1.7–7.7)
Neutrophils Relative %: 69 %
Platelet Count: 193 10*3/uL (ref 150–400)
RBC: 3.94 MIL/uL — ABNORMAL LOW (ref 4.22–5.81)
RDW: 12.7 % (ref 11.5–15.5)
WBC Count: 7.3 10*3/uL (ref 4.0–10.5)
nRBC: 0 % (ref 0.0–0.2)

## 2023-10-14 LAB — URINALYSIS, COMPLETE (UACMP) WITH MICROSCOPIC
Bilirubin Urine: NEGATIVE
Glucose, UA: 50 mg/dL — AB
Ketones, ur: NEGATIVE mg/dL
Nitrite: NEGATIVE
Protein, ur: NEGATIVE mg/dL
Specific Gravity, Urine: 1.015 (ref 1.005–1.030)
WBC, UA: >50 WBC/hpf (ref 0–5)
pH: 5 (ref 5.0–8.0)

## 2023-10-14 LAB — CMP (CANCER CENTER ONLY)
ALT: 21 U/L (ref 0–44)
AST: 19 U/L (ref 15–41)
Albumin: 4.2 g/dL (ref 3.5–5.0)
Alkaline Phosphatase: 51 U/L (ref 38–126)
Anion gap: 3 — ABNORMAL LOW (ref 5–15)
BUN: 14 mg/dL (ref 8–23)
CO2: 30 mmol/L (ref 22–32)
Calcium: 9.2 mg/dL (ref 8.9–10.3)
Chloride: 105 mmol/L (ref 98–111)
Creatinine: 0.88 mg/dL (ref 0.61–1.24)
GFR, Estimated: >60 mL/min (ref >60)
Glucose, Bld: 177 mg/dL — ABNORMAL HIGH (ref 70–99)
Potassium: 4.1 mmol/L (ref 3.5–5.1)
Sodium: 138 mmol/L (ref 135–145)
Total Bilirubin: 0.4 mg/dL (ref 0.0–1.2)
Total Protein: 6.9 g/dL (ref 6.5–8.1)

## 2023-10-14 MED ORDER — SODIUM CHLORIDE 0.9% FLUSH
10.0000 mL | INTRAVENOUS | Status: DC | PRN
  Administered 2023-10-14: 10 mL

## 2023-10-14 MED ORDER — SODIUM CHLORIDE 0.9 % IV SOLN
200.0000 mg | Freq: Once | INTRAVENOUS | Status: AC
  Administered 2023-10-14: 200 mg via INTRAVENOUS
  Filled 2023-10-14: qty 8

## 2023-10-14 MED ORDER — SODIUM CHLORIDE 0.9 % IV SOLN: Freq: Once | INTRAVENOUS | Status: AC

## 2023-10-14 MED ORDER — HEPARIN SOD (PORK) LOCK FLUSH 100 UNIT/ML IV SOLN
500.0000 [IU] | Freq: Once | INTRAVENOUS | Status: AC | PRN
  Administered 2023-10-14: 500 [IU]

## 2023-10-14 NOTE — Patient Instructions (Signed)

## 2023-10-14 NOTE — Telephone Encounter (Signed)
 Open in Error.

## 2023-10-14 NOTE — Progress Notes (Signed)
 [facility]   [phone] [phone]   Clinic Follow up Note   Patient Care Team: Charlane Ferretti, DO as PCP - General (Internal Medicine) Malachy Mood, MD as Attending Physician (Hematology and Oncology) Marcine Matar, MD as Consulting Physician (Urology)  Date of Service:  10/14/2023  CHIEF COMPLAINT: f/u of bladder cancer  CURRENT THERAPY:  Keytruda every 3 weeks  Oncology History   Bladder cancer [facility]) -initially diagnosed in 2017  -He was found to have high-grade urothelial carcinoma without muscle invasion and developed and subsequently BCG refractory disease in December 2021. He received Intravesicular gemcitabine with a total of 2000 mg weekly X6 started in February 2022  -bladder biopsies, bladder washings on 06/24/2022 showed focal urothelial carcinoma in situ (CIS) and cytology was positive for high grade urothelial carcinoma -I recommended intravesical gemcitabine 2000 mg weekly x 6, he started on 08/28/16.  He tolerated first cycle poorly with significant hematuria, and intermittent fever for week after treatment. -Due to his poor tolerance to intravesical gemcitabine, I changed therapy to intravesical mitomycin 40 mg every week for 5 weeks. He tolerated the treatment very well  -His surveillance cystoscopy and biopsy were negative for residual cancer -Given his good response to intravesical mitomycin, and his high risk of recurrence, Dr. Retta Diones recommend small intravesical therapy.  Patient agrees to proceed, started in in April 2024. Unfortunately he has had recurrent leakage from the intravesical therapy, and not able to continue. -Treatment was switched to [facility] on March 18, 2023, he has been tolerating well overall.  Plan to continue every 3 weeks for up to 2 years if no recurrence.    Assessment and Plan    Bladder cancer He is undergoing treatment with Keytruda infusions since August 2024, experiencing post-infusion  fatigue, a common side effect, with recovery after a few days. Blood counts show normal white count and slightly below normal hemoglobin, indicating no acute infection or significant anemia. - Continue Keytruda infusions, is overall tolerating well. -Next cystoscopy in April  Low-grade fever He has a daily low-grade fever of 99.23F around 3 PM for two weeks, resolving without medication. No symptoms typical of cold or flu, but a dry cough is present. Differential includes infection or treatment side effect. COVID-19 and flu are considered despite atypical symptoms. - Check for COVID-19 and flu using home test kits - Order chest x-ray to evaluate cough and rule out pneumonia - Check oxygen saturation during ambulation -Will also obtain UA and urine culture today - Consider antibiotics if tests are negative and symptoms persist  Cough Persistent dry cough without sputum production, possibly related to treatment or infection. No shortness of breath or significant respiratory distress. - Order chest x-ray to evaluate cough - Monitor for any changes in cough or development of new symptoms  Fatigue post-infusion Experiences fatigue and lethargy following Keytruda infusions, with improvement a few days post-infusion. - Encourage maintaining activity levels - Monitor for any changes in fatigue pattern  Plan -Lab reviewed, adequate for treatment, will proceed with Keytruda today and continue every 3 weeks -Due to his low-grade fever, will check chest x-ray, UA and a urine analysis, to see if he needs antibiotics. -No hypoxia during 5-6 minutes walking in office -Follow-up in 3 weeks      SUMMARY OF ONCOLOGIC HISTORY: Oncology History Overview Note   Cancer Staging  Bladder cancer [facility]) Staging form: Urinary Bladder, AJCC 8th Edition - Clinical: Stage I (cT1, cN0, cM0) - Signed by Benjiman Core, MD  on 09/12/2020 WHO/ISUP grade (low/high): High Grade Histologic grading system: 2 grade  system - Pathologic stage from 06/24/2022: Stage 0is (pTis, pN0, cM0) - Signed by Malachy Mood, MD on 08/24/2022 Stage prefix: Initial diagnosis WHO/ISUP grade (low/high): High Grade Histologic grading system: 2 grade system     Bladder cancer (HCC)  09/12/2020 Initial Diagnosis   Bladder cancer (HCC)   09/12/2020 Cancer Staging   Staging form: Urinary Bladder, AJCC 8th Edition - Clinical: Stage I (cT1, cN0, cM0) - Signed by Benjiman Core, MD on 09/12/2020 WHO/ISUP grade (low/high): High Grade Histologic grading system: 2 grade system   06/24/2022 Pathology Results             FINAL MICROSCOPIC DIAGNOSIS:  A. BLADDER, BIOPSY: Focal urothelial carcinoma in situ (CIS)     06/24/2022 Cancer Staging   Staging form: Urinary Bladder, AJCC 8th Edition - Pathologic stage from 06/24/2022: Stage 0is (pTis, pN0, cM0) - Signed by Malachy Mood, MD on 08/24/2022 Stage prefix: Initial diagnosis WHO/ISUP grade (low/high): High Grade Histologic grading system: 2 grade system   08/28/2022 - 08/28/2022 Chemotherapy   Patient is on Treatment Plan : BLADDER Gemcitabine INTRAVESICAL (2000) q7d     09/12/2022 - 02/26/2023 Chemotherapy   Patient is on Treatment Plan : BLADDER Mitomycin INTRAVESICAL (40 mg) q7d     10/25/2022 Genetic Testing   Negative [facility] CancerNext-Expanded +RNA Panel.  Report date is 10/25/2022.   The CancerNext-Expanded gene panel offered by [facility] and includes sequencing, rearrangement, and RNA analysis for the following 77 genes: AIP, ALK, APC, ATM, AXIN2, BAP1, BARD1, BLM, BMPR1A, BRCA1, BRCA2, BRIP1, CDC73, CDH1, CDK4, CDKN1B, CDKN2A, CHEK2, CTNNA1, DICER1, FANCC, FH, FLCN, GALNT12, KIF1B, LZTR1, MAX, MEN1, MET, MLH1, MSH2, MSH3, MSH6, MUTYH, NBN, NF1, NF2, NTHL1, PALB2, PHOX2B, PMS2, POT1, PRKAR1A, PTCH1, PTEN, RAD51C, RAD51D, RB1, RECQL, RET, SDHA, SDHAF2, SDHB, SDHC, SDHD, SMAD4, SMARCA4, SMARCB1, SMARCE1, STK11, SUFU, TMEM127, TP53, TSC1, TSC2, VHL and XRCC2 (sequencing  and deletion/duplication); EGFR, EGLN1, HOXB13, KIT, MITF, PDGFRA, POLD1, and POLE (sequencing only); EPCAM and GREM1 (deletion/duplication only).    03/18/2023 -  Chemotherapy   Patient is on Treatment Plan : BLADDER Pembrolizumab (200) q21d     Malignant neoplasm of trigone of urinary bladder (HCC)  09/12/2020 Initial Diagnosis   Malignant neoplasm of trigone of urinary bladder (HCC)   09/26/2020 - 10/31/2020 Chemotherapy   Patient is on Treatment Plan : BLADDER Gemcitabine q7d        Discussed the use of AI scribe software for clinical note transcription with the patient, who gave verbal consent to proceed.  History of Present Illness   The patient, an 83 year old with a history of blood cancer currently on Keytruda, presents with a low-grade fever that occurs in the afternoon for the past two weeks. The fever, which is consistently 99.7 degrees Fahrenheit, lasts for a couple of hours and is followed by a sensation of coldness. The patient's caregiver reports that the patient feels hot during these episodes. The patient also reports a deep, dry cough, but denies any production of sputum. The patient denies any shortness of breath or difficulty breathing. The patient's caregiver has been monitoring the patient's blood sugar, blood pressure, and oxygen levels, all of which have been within normal limits. The patient is active, walking to the shop and back multiple times a day. The patient also has a history of bladder cancer and glaucoma.         All other systems were reviewed with the patient and  are negative.  MEDICAL HISTORY:  Past Medical History:  Diagnosis Date   Bladder cancer (HCC)    Blind right eye    BPH (benign prostatic hypertrophy)    Cataracts, both eyes    Depression    DM type 2 (diabetes mellitus, type 2) (HCC)    DOE (dyspnea on exertion)    with heavy exertion   Full dentures    GERD (gastroesophageal reflux disease)    Glaucoma no peripheral or side vision    right eye blind   Glaucoma, right eye    HOH (hard of hearing)    Hyperlipidemia    Hypertension    Hypothyroidism    Local recurrence of cancer of urinary bladder (HCC) urologsit-  dr Retta Diones   1st dx 01-29-2016--  now recurrent 05-31-2016   Lower extremity edema 07/11/2020   right swells more than left goes down at night after sleeping   Lower urinary tract symptoms (LUTS) 2019   Migraine    Peripheral neuropathy    middle finger left hand   Pneumonia 2019   Skin abnormality    base of spine looks like pimple has had since 07-01-2020, no drainage area intact   Wears glasses     SURGICAL HISTORY: Past Surgical History:  Procedure Laterality Date   ANTERIOR CERVICAL DECOMP/DISCECTOMY FUSION  08-15-2000   C5 -- C6   ANTERIOR CERVICAL DECOMP/DISCECTOMY FUSION  03-04-2008   C6 -- C7   areas removed from back at spine  2021 jan and aug 2021   CYSTOSCOPY W/ RETROGRADES Left 05/11/2020   Procedure: Cystoscopy with left retrograde ureteropyelogram, resection of probable bladder tumor and left trigonal/ureteral orifice region, 3 cm in size, fluoroscopic interpretation, left ureteroscopy, placement of 6 French by 26 cm contour double-J stent without tether;  Surgeon: Marcine Matar, MD;  Location: [facility];  Service: Urology;  Laterality: Left;   CYSTOSCOPY W/ RETROGRADES Bilateral 06/24/2022   Procedure: CYSTOSCOPY WITH RETROGRADE PYELOGRAM;  Surgeon: Marcine Matar, MD;  Location: [facility];  Service: Urology;  Laterality: Bilateral;  1 HR   CYSTOSCOPY WITH BIOPSY N/A 01/29/2016   Procedure: CYSTOSCOPY WITH BIOPSY;  Surgeon: Marcine Matar, MD;  Location: [facility];  Service: Urology;  Laterality: N/A;   CYSTOSCOPY WITH BIOPSY N/A 05/31/2016   Procedure: CYSTOSCOPY WITH BIOPSY;  Surgeon: Marcine Matar, MD;  Location: [facility];  Service: Urology;  Laterality: N/A;   CYSTOSCOPY WITH BIOPSY N/A 06/24/2022   Procedure:  CYSTOSCOPY WITH BIOPSY;  Surgeon: Marcine Matar, MD;  Location: [facility];  Service: Urology;  Laterality: N/A;   EYE SURGERY Right 1998 & 2000   [place]   GLAUCOMA SURGERY  1998 and 2000   IR IMAGING GUIDED PORT INSERTION  03/31/2023   lower back  06/01/2019, 02-10-2020   nose cancer  areas removed  2019, 2021   x 2 / 3/ 2021 small area removed from nose   surgery for sacral fracture  40 yrs ago   TRANSURETHRAL RESECTION OF BLADDER TUMOR N/A 07/17/2020   Procedure: TRANSURETHRAL RESECTION OF BLADDER TUMOR (TURBT);  Surgeon: Marcine Matar, MD;  Location: [facility];  Service: Urology;  Laterality: N/A;    I have reviewed the social history and family history with the patient and they are unchanged from previous note.  ALLERGIES:  has no known allergies.  MEDICATIONS:  Current Outpatient Medications  Medication Sig Dispense Refill   acetaminophen (TYLENOL) 500 MG tablet Take 1,000 mg by mouth every  8 (eight) hours as needed for moderate pain.     amLODipine (NORVASC) 10 MG tablet Take 10 mg by mouth daily.     atorvastatin (LIPITOR) 20 MG tablet Take 20 mg by mouth at bedtime.      carteolol (OCUPRESS) 1 % ophthalmic solution Place 1 drop into the right eye every morning.     cetirizine (ZYRTEC) 10 MG tablet Take 10 mg by mouth daily as needed for allergies.     clotrimazole-betamethasone (LOTRISONE) lotion Apply 1 Application topically daily as needed (irritation).     fluticasone (FLONASE) 50 MCG/ACT nasal spray Place 2 sprays into both nostrils daily as needed for allergies.      glimepiride (AMARYL) 1 MG tablet Take 1 mg by mouth daily with breakfast.     ibuprofen (ADVIL) 200 MG tablet Take 600 mg by mouth every 8 (eight) hours as needed for moderate pain.     latanoprost (XALATAN) 0.005 % ophthalmic solution Place 1 drop into the right eye at bedtime.     levothyroxine (SYNTHROID, LEVOTHROID) 100 MCG tablet Take 100 mcg by mouth daily before breakfast.       lidocaine-prilocaine (EMLA) cream Apply 1 Application topically as needed. 30 g 0   losartan (COZAAR) 100 MG tablet Take 100 mg by mouth at bedtime.      meloxicam (MOBIC) 15 MG tablet Take 15 mg by mouth daily.     Misc Natural Products (COMPLETE PROSTATE HEALTH PO) Take 1 tablet by mouth 2 (two) times daily.     omeprazole (PRILOSEC) 20 MG capsule Take 20 mg by mouth daily.     oxybutynin (DITROPAN-XL) 5 MG 24 hr tablet TAKE 1 TABLET (5 MG TOTAL) BY MOUTH ONCE A WEEK. TAKE 2 HOURS BEFORE SCHEDULED CHEMO IN OUR OFFICE 90 tablet 1   oxycodone (OXY-IR) 5 MG capsule Take 5 mg by mouth every 6 (six) hours as needed for pain.     sitaGLIPtin (JANUVIA) 100 MG tablet Take 100 mg by mouth every morning.     tamsulosin (FLOMAX) 0.4 MG CAPS capsule Take 0.4 mg by mouth every morning.      vitamin B-12 (CYANOCOBALAMIN) 500 MCG tablet Take 500 mcg by mouth daily.     No current facility-administered medications for this visit.    PHYSICAL EXAMINATION: ECOG PERFORMANCE STATUS: 1 - Symptomatic but completely ambulatory  There were no vitals filed for this visit. Wt Readings from Last 3 Encounters:  10/14/23 174 lb (78.9 kg)  09/23/23 174 lb 14.4 oz (79.3 kg)  09/02/23 182 lb 9.6 oz (82.8 kg)     GENERAL:alert, no distress and comfortable SKIN: skin color, texture, turgor are normal, no rashes or significant lesions EYES: normal, Conjunctiva are pink and non-injected, sclera clear NECK: supple, thyroid normal size, non-tender, without nodularity LYMPH:  no palpable lymphadenopathy in the cervical, axillary  LUNGS: clear to auscultation and percussion with normal breathing effort HEART: regular rate & rhythm and no murmurs and no lower extremity edema ABDOMEN:abdomen soft, non-tender and normal bowel sounds Musculoskeletal:no cyanosis of digits and no clubbing  NEURO: alert & oriented x 3 with fluent speech, no focal motor/sensory deficits   LABORATORY DATA:  I have reviewed the data as  listed    Latest Ref Rng & Units 10/14/2023   10:40 AM 09/23/2023   10:47 AM 09/02/2023    9:51 AM  CBC  WBC 4.0 - 10.5 K/uL 7.3  7.3  8.3   Hemoglobin 13.0 - 17.0 g/dL 95.6  12.8  13.1   Hematocrit 39.0 - 52.0 % 36.1  37.6  38.0   Platelets 150 - 400 K/uL 193  159  196         Latest Ref Rng & Units 10/14/2023   10:40 AM 09/23/2023   10:47 AM 09/02/2023    9:51 AM  CMP  Glucose 70 - 99 mg/dL 161  096  045   BUN 8 - 23 mg/dL 14  19  21    Creatinine 0.61 - 1.24 mg/dL 4.09  8.11  9.14   Sodium 135 - 145 mmol/L 138  138  137   Potassium 3.5 - 5.1 mmol/L 4.1  4.1  3.8   Chloride 98 - 111 mmol/L 105  104  101   CO2 22 - 32 mmol/L 30  29  30    Calcium 8.9 - 10.3 mg/dL 9.2  9.3  9.8   Total Protein 6.5 - 8.1 g/dL 6.9  6.8  7.0   Total Bilirubin 0.0 - 1.2 mg/dL 0.4  0.5  0.5   Alkaline Phos 38 - 126 U/L 51  53  52   AST 15 - 41 U/L 19  15  16    ALT 0 - 44 U/L 21  20  26        RADIOGRAPHIC STUDIES: I have personally reviewed the radiological images as listed and agreed with the findings in the report. DG Chest 2 View Result Date: 10/14/2023 CLINICAL DATA:  Fever and cough. EXAM: CHEST - 2 VIEW COMPARISON:  08/31/2022 FINDINGS: The heart size and mediastinal contours are within normal limits. Right-sided Port-A-Cath is seen in appropriate position. Both lungs are clear. Cervical spine fusion hardware again noted. IMPRESSION: No active cardiopulmonary disease. Electronically Signed   By: Danae Orleans M.D.   On: 10/14/2023 13:08      Orders Placed This Encounter  Procedures   Urine Culture    Standing Status:   Future    Number of Occurrences:   1    Expected Date:   10/14/2023    Expiration Date:   10/13/2024   DG Chest 2 View    Standing Status:   Future    Number of Occurrences:   1    Expected Date:   10/14/2023    Expiration Date:   10/13/2024    Reason for Exam (SYMPTOM  OR DIAGNOSIS REQUIRED):   COUGH AND low grade fever    Preferred imaging location?:   [facility]   CBC  with Differential (Cancer Center Only)    Standing Status:   Future    Expected Date:   11/25/2023    Expiration Date:   11/24/2024   CMP (Cancer Center only)    Standing Status:   Future    Expected Date:   11/25/2023    Expiration Date:   11/24/2024   CBC with Differential (Cancer Center Only)    Standing Status:   Future    Expected Date:   12/16/2023    Expiration Date:   12/15/2024   CMP (Cancer Center only)    Standing Status:   Future    Expected Date:   12/16/2023    Expiration Date:   12/15/2024   Urinalysis, Complete w Microscopic    Standing Status:   Future    Number of Occurrences:   1    Expected Date:   10/14/2023    Expiration Date:   10/13/2024   All questions were answered. The patient knows to call the clinic  with any problems, questions or concerns. No barriers to learning was detected. The total time spent in the appointment was 30 minutes.     Malachy Mood, MD 10/14/2023

## 2023-10-14 NOTE — Progress Notes (Signed)
 Ambulated patient around the nurses station for 5 min with a pulse ox. His sats stayed at 97 to 99 %. His pulse was 78 when we started and at the end of the walk he was at 98. Made the doctor aware of the results and patient went to radiology to have his chest xray.

## 2023-10-15 LAB — URINE CULTURE: Culture: 40000 — AB

## 2023-10-16 ENCOUNTER — Inpatient Hospital Stay: Payer: PPO | Admitting: Internal Medicine

## 2023-10-16 ENCOUNTER — Other Ambulatory Visit: Payer: Self-pay

## 2023-10-16 VITALS — BP 134/62 | HR 81 | Temp 98.2°F | Resp 16 | Wt 177.8 lb

## 2023-10-16 DIAGNOSIS — R251 Tremor, unspecified: Secondary | ICD-10-CM | POA: Insufficient documentation

## 2023-10-16 DIAGNOSIS — Z5112 Encounter for antineoplastic immunotherapy: Secondary | ICD-10-CM | POA: Diagnosis not present

## 2023-10-16 MED ORDER — PROPRANOLOL HCL 20 MG PO TABS: 20.0000 mg | ORAL_TABLET | Freq: Two times a day (BID) | ORAL | 2 refills | Status: DC

## 2023-10-16 NOTE — Progress Notes (Signed)
 [facility] [address]  [place], [place] [address] [phone]   New Patient Evaluation  Date of Service: 10/16/23 Patient Name: Keith [name] Patient MRN: [id number] Patient DOB: 11-May-1941 Provider: Henreitta Leber, MD  Identifying Statement:  Keith [name] is a 83 y.o. male with Tremor of both hands who presents for initial consultation and evaluation regarding cancer associated neurologic deficits.    Referring Provider: Charlane Ferretti, DO [address] [place],  [place] [address]  Primary Cancer:  Oncologic History: Oncology History Overview Note   Cancer Staging  Bladder cancer [facility]) Staging form: Urinary Bladder, AJCC 8th Edition - Clinical: Stage I (cT1, cN0, cM0) - Signed by Benjiman Core, MD on 09/12/2020 WHO/ISUP grade (low/high): High Grade Histologic grading system: 2 grade system - Pathologic stage from 06/24/2022: Stage 0is (pTis, pN0, cM0) - Signed by Malachy Mood, MD on 08/24/2022 Stage prefix: Initial diagnosis WHO/ISUP grade (low/high): High Grade Histologic grading system: 2 grade system     Bladder cancer (HCC)  09/12/2020 Initial Diagnosis   Bladder cancer (HCC)   09/12/2020 Cancer Staging   Staging form: Urinary Bladder, AJCC 8th Edition - Clinical: Stage I (cT1, cN0, cM0) - Signed by Benjiman Core, MD on 09/12/2020 WHO/ISUP grade (low/high): High Grade Histologic grading system: 2 grade system   06/24/2022 Pathology Results             FINAL MICROSCOPIC DIAGNOSIS:  A. BLADDER, BIOPSY: Focal urothelial carcinoma in situ (CIS)     06/24/2022 Cancer Staging   Staging form: Urinary Bladder, AJCC 8th Edition - Pathologic stage from 06/24/2022: Stage 0is (pTis, pN0, cM0) - Signed by Malachy Mood, MD on 08/24/2022 Stage prefix: Initial diagnosis WHO/ISUP grade (low/high): High Grade Histologic grading system: 2 grade system   08/28/2022 - 08/28/2022 Chemotherapy   Patient is on Treatment Plan :  BLADDER Gemcitabine INTRAVESICAL (2000) q7d     09/12/2022 - 02/26/2023 Chemotherapy   Patient is on Treatment Plan : BLADDER Mitomycin INTRAVESICAL (40 mg) q7d     10/25/2022 Genetic Testing   Negative [facility] CancerNext-Expanded +RNA Panel.  Report date is 10/25/2022.   The CancerNext-Expanded gene panel offered by [facility] and includes sequencing, rearrangement, and RNA analysis for the following 77 genes: AIP, ALK, APC, ATM, AXIN2, BAP1, BARD1, BLM, BMPR1A, BRCA1, BRCA2, BRIP1, CDC73, CDH1, CDK4, CDKN1B, CDKN2A, CHEK2, CTNNA1, DICER1, FANCC, FH, FLCN, GALNT12, KIF1B, LZTR1, MAX, MEN1, MET, MLH1, MSH2, MSH3, MSH6, MUTYH, NBN, NF1, NF2, NTHL1, PALB2, PHOX2B, PMS2, POT1, PRKAR1A, PTCH1, PTEN, RAD51C, RAD51D, RB1, RECQL, RET, SDHA, SDHAF2, SDHB, SDHC, SDHD, SMAD4, SMARCA4, SMARCB1, SMARCE1, STK11, SUFU, TMEM127, TP53, TSC1, TSC2, VHL and XRCC2 (sequencing and deletion/duplication); EGFR, EGLN1, HOXB13, KIT, MITF, PDGFRA, POLD1, and POLE (sequencing only); EPCAM and GREM1 (deletion/duplication only).    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  Keith [name] presents today to review new tremor.  He describes action based shaking of both hands, symmetric and rhythmic.  Onset was in August following first infusion  of [place].  Since then, he has had severe tremor for 2-3 days following each infusion.  In between treatments, symptoms are more mild and less intrusive.  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  No other exacerbating feautures, denies other neurologic symptoms.   Medications: Current Outpatient Medications on File Prior to Visit  Medication Sig Dispense Refill   acetaminophen (TYLENOL) 500 MG tablet Take 1,000 mg by mouth every 8 (eight) hours as needed for moderate pain.     amLODipine (NORVASC) 10 MG tablet Take 10 mg by mouth daily.     atorvastatin (LIPITOR) 20 MG tablet Take 20 mg by mouth at bedtime.      carteolol (OCUPRESS) 1 % ophthalmic solution Place 1 drop into the right eye every morning.     cetirizine (ZYRTEC) 10 MG tablet Take 10 mg by mouth daily as needed for allergies.     clotrimazole-betamethasone (LOTRISONE) lotion Apply 1 Application topically daily as needed (irritation).     fluticasone (FLONASE) 50 MCG/ACT nasal spray Place 2 sprays into both nostrils daily as needed for allergies.      glimepiride (AMARYL) 1 MG tablet Take 1 mg by mouth daily with breakfast.     latanoprost (XALATAN) 0.005 % ophthalmic solution Place 1 drop into the right eye at bedtime.     levothyroxine (SYNTHROID, LEVOTHROID) 100 MCG tablet Take 100 mcg by mouth daily before breakfast.      lidocaine-prilocaine (EMLA) cream Apply 1 Application topically as needed. 30 g 0   losartan (COZAAR) 100 MG tablet Take 100 mg by mouth at bedtime.      meloxicam (MOBIC) 15 MG tablet Take 15 mg by mouth daily.     Misc Natural Products (COMPLETE PROSTATE HEALTH PO) Take 1 tablet by mouth 2 (two) times daily.     omeprazole (PRILOSEC) 20 MG capsule Take 20 mg by mouth daily.     sitaGLIPtin (JANUVIA) 100 MG tablet Take 100 mg by mouth every morning.     tamsulosin (FLOMAX) 0.4 MG CAPS capsule Take 0.4 mg by mouth every morning.      vitamin B-12 (CYANOCOBALAMIN) 500 MCG tablet Take 500 mcg by mouth daily.     ibuprofen (ADVIL) 200 MG tablet Take 600 mg by mouth every 8 (eight) hours as needed for moderate pain.     oxybutynin (DITROPAN-XL) 5 MG 24 hr tablet TAKE 1 TABLET (5  MG TOTAL) BY MOUTH ONCE A WEEK. TAKE 2 HOURS BEFORE SCHEDULED CHEMO IN OUR OFFICE (Patient not taking: Reported on 10/16/2023) 90 tablet 1   oxycodone (OXY-IR) 5 MG capsule Take 5 mg by mouth every 6 (six) hours as needed for pain. (Patient not taking: Reported on 10/16/2023)     No current facility-administered medications on file prior to visit.    Allergies: No Known Allergies Past Medical History:  Past Medical History:  Diagnosis Date   Bladder cancer (HCC)    Blind right eye    BPH (benign prostatic hypertrophy)    Cataracts, both eyes    Depression    DM type 2 (diabetes mellitus, type 2) (HCC)    DOE (dyspnea on exertion)    with heavy exertion   Full dentures    GERD (gastroesophageal reflux disease)    Glaucoma no peripheral or side vision   right eye blind   Glaucoma, right eye    HOH (hard of hearing)  Hyperlipidemia    Hypertension    Hypothyroidism    Local recurrence of cancer of urinary bladder [facility]) urologsit-  dr Retta Diones   1st dx 01-29-2016--  now recurrent 05-31-2016   Lower extremity edema 07/11/2020   right swells more than left goes down at night after sleeping   Lower urinary tract symptoms (LUTS) 2019   Migraine    Peripheral neuropathy    middle finger left hand   Pneumonia 2019   Skin abnormality    base of spine looks like pimple has had since 07-01-2020, no drainage area intact   Wears glasses    Past Surgical History:  Past Surgical History:  Procedure Laterality Date   ANTERIOR CERVICAL DECOMP/DISCECTOMY FUSION  08-15-2000   C5 -- C6   ANTERIOR CERVICAL DECOMP/DISCECTOMY FUSION  03-04-2008   C6 -- C7   areas removed from back at spine  2021 jan and aug 2021   CYSTOSCOPY W/ RETROGRADES Left 05/11/2020   Procedure: Cystoscopy with left retrograde ureteropyelogram, resection of probable bladder tumor and left trigonal/ureteral orifice region, 3 cm in size, fluoroscopic interpretation, left ureteroscopy, placement of 6 French by 26 cm contour  double-J stent without tether;  Surgeon: Marcine Matar, MD;  Location: [facility];  Service: Urology;  Laterality: Left;   CYSTOSCOPY W/ RETROGRADES Bilateral 06/24/2022   Procedure: CYSTOSCOPY WITH RETROGRADE PYELOGRAM;  Surgeon: Marcine Matar, MD;  Location: [facility];  Service: Urology;  Laterality: Bilateral;  1 HR   CYSTOSCOPY WITH BIOPSY N/A 01/29/2016   Procedure: CYSTOSCOPY WITH BIOPSY;  Surgeon: Marcine Matar, MD;  Location[facility];  Service: Urology;  Laterality: N/A;   CYSTOSCOPY WITH BIOPSY N/A 05/31/2016   Procedure: CYSTOSCOPY WITH BIOPSY;  Surgeon: Marcine Matar, MD;  Location: [facility];  Service: Urology;  Laterality: N/A;   CYSTOSCOPY WITH BIOPSY N/A 06/24/2022   Procedure: CYSTOSCOPY WITH BIOPSY;  Surgeon: Marcine Matar, MD;  Location: [facility];  Service: Urology;  Laterality: N/A;   EYE SURGERY Right 1998 & 2000   Glu[place]oma   GLAUCOMA SURGERY  1998 and 2000   IR IMAGING GUIDED PORT INSERTION  03/31/2023   lower back  06/01/2019, 02-10-2020   nose cancer  areas removed  2019, 2021   x 2 / 3/ 2021 small area removed from nose   surgery for sacral fracture  40 yrs ago   TRANSURETHRAL RESECTION OF BLADDER TUMOR N/A 07/17/2020   Procedure: TRANSURETHRAL RESECTION OF BLADDER TUMOR (TURBT);  Surgeon: Marcine Matar, MD;  Location: [facility];  Service: Urology;  Laterality: N/A;   Social History:  Social History   Socioeconomic History   Marital status: Married    Spouse name: Not on file   Number of children: 1   Years of education: Not on file   Highest education level: Not on file  Occupational History   Not on file  Tobacco Use   Smoking status: Former    Current packs/day: 0.00    Average packs/day: 1 pack/day for 50.0 years (50.0 ttl pk-yrs)    Types: Cigarettes    Start date: 06/27/1954    Quit date: 06/27/2004    Years since quitting: 19.3   Smokeless tobacco: Never   Vaping Use   Vaping status: Never Used  Substance and Sexual Activity   Alcohol use: Yes    Alcohol/week: 0.0 standard drinks of alcohol    Comment: occ moonshine garles throat with   Drug use: No   Sexual activity:  Not on file  Other Topics Concern   Not on file  Social History Narrative   Not on file   Social Drivers of Health   Financial Resource Strain: Not on file  Food Insecurity: Not on file  Transportation Needs: Not on file  Physical Activity: Not on file  Stress: Not on file  Social Connections: Not on file  Intimate Partner Violence: Not on file   Family History:  Family History  Problem Relation Age of Onset   Heart disease Mother    Heart disease Father    Breast cancer Sister 21   Breast cancer Sister 2       d. 37   Breast cancer Sister 75   Brain cancer Sister        dx and d. 73s   Bladder Cancer Brother 3   Lung cancer Brother        dx after 39   Lung cancer Brother 51   Skin cancer Daughter        melanoma and BCC in 42s   Cancer Nephew        3 nephews with lung, kidney, and unknown ca dx before age 44    Review of Systems: Constitutional: Doesn't report fevers, chills or abnormal weight loss Eyes: Doesn't report blurriness of vision Ears, nose, mouth, throat, and face: Doesn't report sore throat Respiratory: Doesn't report cough, dyspnea or wheezes Cardiovascular: Doesn't report palpitation, chest discomfort  Gastrointestinal:  Doesn't report nausea, constipation, diarrhea GU: Doesn't report incontinence Skin: Doesn't report skin rashes Neurological: Per HPI Musculoskeletal: Doesn't report joint pain Behavioral/Psych: Doesn't report anxiety  Physical Exam: Vitals:   10/16/23 0926  BP: 134/62  Pulse: 81  Resp: 16  Temp: 98.2 F (36.8 C)  SpO2: 98%   KPS: 80. General: Alert, cooperative, pleasant, in no acute distress Head: Normal EENT: No conjunctival injection or scleral icterus.  Lungs: Resp effort normal Cardiac:  Regular rate Abdomen: Non-distended abdomen Skin: No rashes cyanosis or petechiae. Extremities: No clubbing or edema  Neurologic Exam: Mental Status: Awake, alert, attentive to examiner. Oriented to self and environment. Language is fluent with intact comprehension.  Cranial Nerves: Visual acuity is grossly normal. Visual fields are full. Extra-ocular movements intact. No ptosis. Face is symmetric Motor: Tone and bulk are normal. Power is full in both arms and legs. High frequency tremor upon reaching, bilateral hands, low amplitude.  Reflexes are symmetric, no pathologic reflexes present.  Sensory: Intact to light touch Gait: Normal.   Labs: I have reviewed the data as listed    Component Value Date/Time   NA 138 10/14/2023 1040   K 4.1 10/14/2023 1040   CL 105 10/14/2023 1040   CO2 30 10/14/2023 1040   GLUCOSE 177 (H) 10/14/2023 1040   BUN 14 10/14/2023 1040   CREATININE 0.88 10/14/2023 1040   CALCIUM 9.2 10/14/2023 1040   PROT 6.9 10/14/2023 1040   ALBUMIN 4.2 10/14/2023 1040   AST 19 10/14/2023 1040   ALT 21 10/14/2023 1040   ALKPHOS 51 10/14/2023 1040   BILITOT 0.4 10/14/2023 1040   GFRNONAA >60 10/14/2023 1040   GFRAA >60 05/11/2020 1300   Lab Results  Component Value Date   WBC 7.3 10/14/2023   NEUTROABS 5.1 10/14/2023   HGB 12.3 (L) 10/14/2023   HCT 36.1 (L) 10/14/2023   MCV 91.6 10/14/2023   PLT 193 10/14/2023     Assessment/Plan Tremor of both hands  Keith [name] presents with clinical syndrome consistent with  intention tremor, bilateral hands.  Etiology is either essential or physiologic.  This is clearly exacerbated by immunotherapy.  Discussed a trial of Propanolol 20mg  BID.  He is on two other blood pressure medications, he checks his BP at home and has not recorded any below normal readings. He understands to continue checking while on this medication, and if he becomes lightheaded he should stop dosing.   We will touch base with him in ~1 month  to review response and continue med titration.    For now he will continue on Keytruda as prior with Dr. Mosetta Putt.  We spent twenty additional minutes teaching regarding the natural history, biology, and historical experience in the treatment of neurologic complications of cancer.   We appreciate the opportunity to participate in the care of Keith [name].   All questions were answered. The patient knows to call the clinic with any problems, questions or concerns. No barriers to learning were detected.  The total time spent in the encounter was 40 minutes and more than 50% was on counseling and review of test results   Henreitta Leber, MD Medical Director of Neuro-Oncology [facility] 10/16/23 11:39 AM

## 2023-10-17 ENCOUNTER — Other Ambulatory Visit: Payer: Self-pay | Admitting: Hematology

## 2023-10-17 ENCOUNTER — Telehealth: Payer: Self-pay | Admitting: Hematology

## 2023-10-17 ENCOUNTER — Encounter: Payer: Self-pay | Admitting: Hematology

## 2023-10-17 MED ORDER — AMOXICILLIN-POT CLAVULANATE 875-125 MG PO TABS: 1.0000 | ORAL_TABLET | Freq: Two times a day (BID) | ORAL | 0 refills | Status: DC

## 2023-10-17 NOTE — Telephone Encounter (Signed)
 I called pt's wife and reviewed his recent test results, cxr was negative, and he tested negative for COVID and flu this week at home. His [place] was positive and urine culture showed Goup B strep, not sure it's skin contamination or true UTI, but I will call in augmentin for 5 days to see if his symptoms improve. His wife voiced good understanding and agrees with the plan.  Keith [name]  10/17/2023

## 2023-10-20 ENCOUNTER — Encounter: Payer: Self-pay | Admitting: Hematology

## 2023-10-21 ENCOUNTER — Other Ambulatory Visit: Payer: Self-pay

## 2023-10-27 ENCOUNTER — Ambulatory Visit (INDEPENDENT_AMBULATORY_CARE_PROVIDER_SITE_OTHER): Payer: Medicare HMO | Admitting: Podiatry

## 2023-10-27 ENCOUNTER — Encounter: Payer: Self-pay | Admitting: Podiatry

## 2023-10-27 DIAGNOSIS — B351 Tinea unguium: Secondary | ICD-10-CM

## 2023-10-27 DIAGNOSIS — M79675 Pain in left toe(s): Secondary | ICD-10-CM

## 2023-10-27 DIAGNOSIS — M79674 Pain in right toe(s): Secondary | ICD-10-CM | POA: Diagnosis not present

## 2023-10-27 DIAGNOSIS — E1149 Type 2 diabetes mellitus with other diabetic neurological complication: Secondary | ICD-10-CM | POA: Diagnosis not present

## 2023-10-29 ENCOUNTER — Other Ambulatory Visit: Payer: Self-pay

## 2023-10-29 NOTE — Progress Notes (Signed)
 Subjective:   Patient ID: Valere Dross, male   DOB: 83 y.o.   MRN: [id number]   HPI Chief Complaint  Patient presents with   [facility]    RM#12 [facility]    83 year old male presents the office with above concerns.  He states his nails are thick and elongated he cannot trim them himself and are causing discomfort. No new concerns. No open lesions.   Charlane Ferretti, DO      Objective:  Physical Exam  General: AAO x3, NAD  Dermatological: Nails are hypertrophic, dystrophic, brittle, discolored, elongated 10.  Yellow discoloration of the toenails.  No surrounding redness or drainage. Tenderness nails 1-5 bilaterally. No open lesions or pre-ulcerative lesions are identified today.  Vascular: Dorsalis Pedis artery and Posterior Tibial artery pedal pulses are 2/4 bilateral with immedate capillary fill time. There is no pain with calf compression, swelling, warmth, erythema.   Neruologic: Sensation decreased with Semmes Weinstein monofilament to the plantar forefoot.  Musculoskeletal: Hammertoes present.  Assessment:   83 year old male with symptomatic onychomycosis, type 2 diabetes with neuropathy     Plan:  -Treatment options discussed including all alternatives, risks, and complications -Etiology of symptoms were discussed -Nails debrided 10 without complications or bleeding.  Continue routine debridement.  -Discussed neuropathy.  Previously on gabapentin but states he had to come off of this due to other issues, liver issues.  Discussed topical creams. -Daily foot inspection  Return in about 3 months (around 01/27/2024).  Ovid Curd, DPM

## 2023-11-04 ENCOUNTER — Encounter: Payer: Self-pay | Admitting: Hematology

## 2023-11-04 ENCOUNTER — Inpatient Hospital Stay: Payer: HMO

## 2023-11-04 ENCOUNTER — Inpatient Hospital Stay (HOSPITAL_BASED_OUTPATIENT_CLINIC_OR_DEPARTMENT_OTHER): Payer: HMO | Admitting: Hematology

## 2023-11-04 VITALS — BP 113/51 | HR 65 | Temp 97.9°F | Resp 16 | Ht 70.0 in | Wt 180.2 lb

## 2023-11-04 DIAGNOSIS — C679 Malignant neoplasm of bladder, unspecified: Secondary | ICD-10-CM

## 2023-11-04 DIAGNOSIS — Z95828 Presence of other vascular implants and grafts: Secondary | ICD-10-CM

## 2023-11-04 DIAGNOSIS — Z5112 Encounter for antineoplastic immunotherapy: Secondary | ICD-10-CM | POA: Diagnosis not present

## 2023-11-04 DIAGNOSIS — C671 Malignant neoplasm of dome of bladder: Secondary | ICD-10-CM

## 2023-11-04 LAB — CBC WITH DIFFERENTIAL (CANCER CENTER ONLY)
Abs Immature Granulocytes: 0.02 10*3/uL (ref 0.00–0.07)
Basophils Absolute: 0.1 10*3/uL (ref 0.0–0.1)
Basophils Relative: 1 %
Eosinophils Absolute: 0.4 10*3/uL (ref 0.0–0.5)
Eosinophils Relative: 8 %
HCT: 36 % — ABNORMAL LOW (ref 39.0–52.0)
Hemoglobin: 12.3 g/dL — ABNORMAL LOW (ref 13.0–17.0)
Immature Granulocytes: 0 %
Lymphocytes Relative: 25 %
Lymphs Abs: 1.4 10*3/uL (ref 0.7–4.0)
MCH: 31.2 pg (ref 26.0–34.0)
MCHC: 34.2 g/dL (ref 30.0–36.0)
MCV: 91.4 fL (ref 80.0–100.0)
Monocytes Absolute: 0.5 10*3/uL (ref 0.1–1.0)
Monocytes Relative: 8 %
Neutro Abs: 3.3 10*3/uL (ref 1.7–7.7)
Neutrophils Relative %: 58 %
Platelet Count: 166 10*3/uL (ref 150–400)
RBC: 3.94 MIL/uL — ABNORMAL LOW (ref 4.22–5.81)
RDW: 12.7 % (ref 11.5–15.5)
WBC Count: 5.7 10*3/uL (ref 4.0–10.5)
nRBC: 0 % (ref 0.0–0.2)

## 2023-11-04 LAB — CMP (CANCER CENTER ONLY)
ALT: 17 U/L (ref 0–44)
AST: 14 U/L — ABNORMAL LOW (ref 15–41)
Albumin: 4.3 g/dL (ref 3.5–5.0)
Alkaline Phosphatase: 58 U/L (ref 38–126)
Anion gap: 4 — ABNORMAL LOW (ref 5–15)
BUN: 17 mg/dL (ref 8–23)
CO2: 29 mmol/L (ref 22–32)
Calcium: 9.1 mg/dL (ref 8.9–10.3)
Chloride: 106 mmol/L (ref 98–111)
Creatinine: 0.87 mg/dL (ref 0.61–1.24)
GFR, Estimated: >60 mL/min (ref >60)
Glucose, Bld: 207 mg/dL — ABNORMAL HIGH (ref 70–99)
Potassium: 3.8 mmol/L (ref 3.5–5.1)
Sodium: 139 mmol/L (ref 135–145)
Total Bilirubin: 0.4 mg/dL (ref 0.0–1.2)
Total Protein: 7 g/dL (ref 6.5–8.1)

## 2023-11-04 LAB — TSH: TSH: 0.865 u[IU]/mL (ref 0.350–4.500)

## 2023-11-04 MED ORDER — SODIUM CHLORIDE 0.9 % IV SOLN: Freq: Once | INTRAVENOUS | Status: AC

## 2023-11-04 MED ORDER — SODIUM CHLORIDE 0.9% FLUSH
10.0000 mL | INTRAVENOUS | Status: DC | PRN
  Administered 2023-11-04: 10 mL

## 2023-11-04 MED ORDER — SODIUM CHLORIDE 0.9 % IV SOLN
200.0000 mg | Freq: Once | INTRAVENOUS | Status: AC
  Administered 2023-11-04: 200 mg via INTRAVENOUS
  Filled 2023-11-04: qty 8

## 2023-11-04 MED ORDER — SODIUM CHLORIDE 0.9% FLUSH: 10.0000 mL | INTRAVENOUS | Status: DC | PRN

## 2023-11-04 MED ORDER — HEPARIN SOD (PORK) LOCK FLUSH 100 UNIT/ML IV SOLN: 500.0000 [IU] | Freq: Once | INTRAVENOUS | Status: DC | PRN

## 2023-11-04 NOTE — Progress Notes (Signed)
 [facility]   [phone] Fax:([phone]) [phone]   Clinic Follow up Note   Patient Care Team: Charlane Ferretti, DO as PCP - General (Internal Medicine) Malachy Mood, MD as Attending Physician (Hematology and Oncology) Marcine Matar, MD as Consulting Physician (Urology)  Date of Service:  11/04/2023  CHIEF COMPLAINT: f/u of bladder cancer  CURRENT THERAPY:  Keytruda every 3 weeks  Oncology History   Bladder cancer [facility]) -initially diagnosed in 2017  -He was found to have high-grade urothelial carcinoma without muscle invasion and developed and subsequently BCG refractory disease in December 2021. He received Intravesicular gemcitabine with a total of 2000 mg weekly X6 started in February 2022  -bladder biopsies, bladder washings on 06/24/2022 showed focal urothelial carcinoma in situ (CIS) and cytology was positive for high grade urothelial carcinoma -I recommended intravesical gemcitabine 2000 mg weekly x 6, he started on 08/28/16.  He tolerated first cycle poorly with significant hematuria, and intermittent fever for week after treatment. -Due to his poor tolerance to intravesical gemcitabine, I changed therapy to intravesical mitomycin 40 mg every week for 5 weeks. He tolerated the treatment very well  -His surveillance cystoscopy and biopsy were negative for residual cancer -Given his good response to intravesical mitomycin, and his high risk of recurrence, Dr. Retta Diones recommend small intravesical therapy.  Patient agrees to proceed, started in in April 2024. Unfortunately he has had recurrent leakage from the intravesical therapy, and not able to continue. -Treatment was switched to [facility] on March 18, 2023, he has been tolerating well overall.  Plan to continue every 3 weeks for up to 2 years if no recurrence.   Assessment and Plan    Bladder cancer Undergoing infusion treatment since August 2024, planned for two years contingent on cystoscopy results.  Experiences significant fatigue, possibly related to infusions. Surgery may be necessary if cancer recurs, though it is to be avoided if possible. - Continue infusion treatment - Schedule cystoscopy in April - Monitor for cancer recurrence - Avoid surgery if possible  Rash and itching Developed rash and itching after starting infusion treatment, likely immune-related. Rash primarily on face and some on back. Recommended topical treatments and antihistamines to manage symptoms. - Use hydrocortisone cream on the face once a day - Use hydrocortisone cream on the body two to three times a day - Use Benadryl cream and pills as needed for itching  Fatigue Reports significant fatigue, possibly related to infusion treatment or back and leg pain. Fatigue affects daily activities and sleep patterns. - Encourage rest and activity as tolerated  Back and leg pain Experiences significant pain affecting ability to stand for long periods. Has a bulging disc. Surgery not planned due to cost and ability to manage pain. - Avoid surgery unless absolutely necessary  Benign tremor Tremors have improved since starting propranolol. Propranolol can lower blood pressure and heart rate, which is being monitored. Blood pressure readings at home are within acceptable ranges. - Continue propranolol -Follow-up with Dr. Barbaraann Cao    Plan -Lab reviewed, will proceed Keytruda today and continue every 3 weeks -Next cystoscopy scheduled in April -Follow-up and treatment in 3 weeks    SUMMARY OF ONCOLOGIC HISTORY: Oncology History Overview Note   Cancer Staging  Bladder cancer [facility]) Staging form: Urinary Bladder, AJCC 8th Edition - Clinical: Stage I (cT1, cN0, cM0) - Signed by Benjiman Core, MD on 09/12/2020 WHO/ISUP grade (low/high): High Grade Histologic grading system: 2 grade system - Pathologic stage from 06/24/2022: Stage 0is (pTis, pN0,  cM0) - Signed by Malachy Mood, MD on 08/24/2022 Stage prefix: Initial  diagnosis WHO/ISUP grade (low/high): High Grade Histologic grading system: 2 grade system     Bladder cancer (HCC)  09/12/2020 Initial Diagnosis   Bladder cancer (HCC)   09/12/2020 Cancer Staging   Staging form: Urinary Bladder, AJCC 8th Edition - Clinical: Stage I (cT1, cN0, cM0) - Signed by Benjiman Core, MD on 09/12/2020 WHO/ISUP grade (low/high): High Grade Histologic grading system: 2 grade system   06/24/2022 Pathology Results             FINAL MICROSCOPIC DIAGNOSIS:  A. BLADDER, BIOPSY: Focal urothelial carcinoma in situ (CIS)     06/24/2022 Cancer Staging   Staging form: Urinary Bladder, AJCC 8th Edition - Pathologic stage from 06/24/2022: Stage 0is (pTis, pN0, cM0) - Signed by Malachy Mood, MD on 08/24/2022 Stage prefix: Initial diagnosis WHO/ISUP grade (low/high): High Grade Histologic grading system: 2 grade system   08/28/2022 - 08/28/2022 Chemotherapy   Patient is on Treatment Plan : BLADDER Gemcitabine INTRAVESICAL (2000) q7d     09/12/2022 - 02/26/2023 Chemotherapy   Patient is on Treatment Plan : BLADDER Mitomycin INTRAVESICAL (40 mg) q7d     10/25/2022 Genetic Testing   Negative [facility] CancerNext-Expanded +RNA Panel.  Report date is 10/25/2022.   The CancerNext-Expanded gene panel offered by [facility] and includes sequencing, rearrangement, and RNA analysis for the following 77 genes: AIP, ALK, APC, ATM, AXIN2, BAP1, BARD1, BLM, BMPR1A, BRCA1, BRCA2, BRIP1, CDC73, CDH1, CDK4, CDKN1B, CDKN2A, CHEK2, CTNNA1, DICER1, FANCC, FH, FLCN, GALNT12, KIF1B, LZTR1, MAX, MEN1, MET, MLH1, MSH2, MSH3, MSH6, MUTYH, NBN, NF1, NF2, NTHL1, PALB2, PHOX2B, PMS2, POT1, PRKAR1A, PTCH1, PTEN, RAD51C, RAD51D, RB1, RECQL, RET, SDHA, SDHAF2, SDHB, SDHC, SDHD, SMAD4, SMARCA4, SMARCB1, SMARCE1, STK11, SUFU, TMEM127, TP53, TSC1, TSC2, VHL and XRCC2 (sequencing and deletion/duplication); EGFR, EGLN1, HOXB13, KIT, MITF, PDGFRA, POLD1, and POLE (sequencing only); EPCAM and GREM1  (deletion/duplication only).    03/18/2023 -  Chemotherapy   Patient is on Treatment Plan : BLADDER Pembrolizumab (200) q21d     Malignant neoplasm of trigone of urinary bladder (HCC)  09/12/2020 Initial Diagnosis   Malignant neoplasm of trigone of urinary bladder (HCC)   09/26/2020 - 10/31/2020 Chemotherapy   Patient is on Treatment Plan : BLADDER Gemcitabine q7d        Discussed the use of AI scribe software for clinical note transcription with the patient, who gave verbal consent to proceed.  History of Present Illness   The patient, an 83 year old male with a history of bladder cancer and tremors, presents for a follow-up visit. He reports experiencing fatigue, which has been ongoing since starting his infusion treatments for bladder cancer. The patient's companion notes that the fatigue could also be due to the patient's back and leg pain, which has been limiting his activities. The patient also reports a skin rash, which started after the infusions and has been spreading. The patient's tremors have improved since starting propranolol. The patient also has a history of back surgeries and a bulging disc, which could be contributing to his back and leg pain. The patient's blood pressure has been monitored at home and has been within normal limits. The patient's companion notes that the patient has been urinating frequently at night.         All other systems were reviewed with the patient and are negative.  MEDICAL HISTORY:  Past Medical History:  Diagnosis Date   Bladder cancer (HCC)    Blind right eye  BPH (benign prostatic hypertrophy)    Cataracts, both eyes    Depression    DM type 2 (diabetes mellitus, type 2) (HCC)    DOE (dyspnea on exertion)    with heavy exertion   Full dentures    GERD (gastroesophageal reflux disease)    Glaucoma no peripheral or side vision   right eye blind   Glaucoma, right eye    HOH (hard of hearing)    Hyperlipidemia    Hypertension     Hypothyroidism    Local recurrence of cancer of urinary bladder [facility]) urologsit-  dr Retta Diones   1st dx 01-29-2016--  now recurrent 05-31-2016   Lower extremity edema 07/11/2020   right swells more than left goes down at night after sleeping   Lower urinary tract symptoms (LUTS) 2019   Migraine    Peripheral neuropathy    middle finger left hand   Pneumonia 2019   Skin abnormality    base of spine looks like pimple has had since 07-01-2020, no drainage area intact   Wears glasses     SURGICAL HISTORY: Past Surgical History:  Procedure Laterality Date   ANTERIOR CERVICAL DECOMP/DISCECTOMY FUSION  08-15-2000   C5 -- C6   ANTERIOR CERVICAL DECOMP/DISCECTOMY FUSION  03-04-2008   C6 -- C7   areas removed from back at spine  2021 jan and aug 2021   CYSTOSCOPY W/ RETROGRADES Left 05/11/2020   Procedure: Cystoscopy with left retrograde ureteropyelogram, resection of probable bladder tumor and left trigonal/ureteral orifice region, 3 cm in size, fluoroscopic interpretation, left ureteroscopy, placement of 6 French by 26 cm contour double-J stent without tether;  Surgeon: Marcine Matar, MD;  Location: [facility];  Service: Urology;  Laterality: Left;   CYSTOSCOPY W/ RETROGRADES Bilateral 06/24/2022   Procedure: CYSTOSCOPY WITH RETROGRADE PYELOGRAM;  Surgeon: Marcine Matar, MD;  Location: [facility];  Service: Urology;  Laterality: Bilateral;  1 HR   CYSTOSCOPY WITH BIOPSY N/A 01/29/2016   Procedure: CYSTOSCOPY WITH BIOPSY;  Surgeon: Marcine Matar, MD;  Location: [facility];  Service: Urology;  Laterality: N/A;   CYSTOSCOPY WITH BIOPSY N/A 05/31/2016   Procedure: CYSTOSCOPY WITH BIOPSY;  Surgeon: Marcine Matar, MD;  Location: [facility];  Service: Urology;  Laterality: N/A;   CYSTOSCOPY WITH BIOPSY N/A 06/24/2022   Procedure: CYSTOSCOPY WITH BIOPSY;  Surgeon: Marcine Matar, MD;  Location: [facility];  Service: Urology;  Laterality:  N/A;   EYE SURGERY Right 1998 & 2000   [place]   GLAUCOMA SURGERY  1998 and 2000   IR IMAGING GUIDED PORT INSERTION  03/31/2023   lower back  06/01/2019, 02-10-2020   nose cancer  areas removed  2019, 2021   x 2 / 3/ 2021 small area removed from nose   surgery for sacral fracture  40 yrs ago   TRANSURETHRAL RESECTION OF BLADDER TUMOR N/A 07/17/2020   Procedure: TRANSURETHRAL RESECTION OF BLADDER TUMOR (TURBT);  Surgeon: Marcine Matar, MD;  Location: [facility];  Service: Urology;  Laterality: N/A;    I have reviewed the social history and family history with the patient and they are unchanged from previous note.  ALLERGIES:  has no known allergies.  MEDICATIONS:  Current Outpatient Medications  Medication Sig Dispense Refill   acetaminophen (TYLENOL) 500 MG tablet Take 1,000 mg by mouth every 8 (eight) hours as needed for moderate pain.     amLODipine (NORVASC) 10 MG tablet Take 10 mg by mouth daily.  amoxicillin-clavulanate (AUGMENTIN) 875-125 MG tablet Take 1 tablet by mouth 2 (two) times daily. (Patient not taking: Reported on 10/27/2023) 10 tablet 0   atorvastatin (LIPITOR) 20 MG tablet Take 20 mg by mouth at bedtime.      carteolol (OCUPRESS) 1 % ophthalmic solution Place 1 drop into the right eye every morning.     cetirizine (ZYRTEC) 10 MG tablet Take 10 mg by mouth daily as needed for allergies.     clotrimazole-betamethasone (LOTRISONE) lotion Apply 1 Application topically daily as needed (irritation).     fluticasone (FLONASE) 50 MCG/ACT nasal spray Place 2 sprays into both nostrils daily as needed for allergies.      glimepiride (AMARYL) 1 MG tablet Take 1 mg by mouth daily with breakfast.     ibuprofen (ADVIL) 200 MG tablet Take 600 mg by mouth every 8 (eight) hours as needed for moderate pain.     latanoprost (XALATAN) 0.005 % ophthalmic solution Place 1 drop into the right eye at bedtime.     levothyroxine (SYNTHROID, LEVOTHROID) 100 MCG tablet Take  100 mcg by mouth daily before breakfast.      lidocaine-prilocaine (EMLA) cream Apply 1 Application topically as needed. 30 g 0   losartan (COZAAR) 100 MG tablet Take 100 mg by mouth at bedtime.      meloxicam (MOBIC) 15 MG tablet Take 15 mg by mouth daily.     Misc Natural Products (COMPLETE PROSTATE HEALTH PO) Take 1 tablet by mouth 2 (two) times daily.     omeprazole (PRILOSEC) 20 MG capsule Take 20 mg by mouth daily.     oxybutynin (DITROPAN-XL) 5 MG 24 hr tablet TAKE 1 TABLET (5 MG TOTAL) BY MOUTH ONCE A WEEK. TAKE 2 HOURS BEFORE SCHEDULED CHEMO IN OUR OFFICE (Patient not taking: Reported on 10/16/2023) 90 tablet 1   oxycodone (OXY-IR) 5 MG capsule Take 5 mg by mouth every 6 (six) hours as needed for pain. (Patient not taking: Reported on 10/16/2023)     propranolol (INDERAL) 20 MG tablet Take 1 tablet (20 mg total) by mouth 2 (two) times daily. 60 tablet 2   sitaGLIPtin (JANUVIA) 100 MG tablet Take 100 mg by mouth every morning.     tamsulosin (FLOMAX) 0.4 MG CAPS capsule Take 0.4 mg by mouth every morning.      vitamin B-12 (CYANOCOBALAMIN) 500 MCG tablet Take 500 mcg by mouth daily.     No current facility-administered medications for this visit.   Facility-Administered Medications Ordered in Other Visits  Medication Dose Route Frequency Provider Last Rate Last Admin   heparin lock flush 100 unit/mL  500 Units Intracatheter Once PRN Malachy Mood, MD       sodium chloride flush (NS) 0.9 % injection 10 mL  10 mL Intracatheter PRN Malachy Mood, MD        PHYSICAL EXAMINATION: ECOG PERFORMANCE STATUS: 2 - Symptomatic, <50% confined to bed  Vitals:   11/04/23 1111  BP: (!) 113/51  Pulse: 65  Resp: 16  Temp: 97.9 F (36.6 C)  SpO2: 98%   Wt Readings from Last 3 Encounters:  11/04/23 180 lb 3.2 oz (81.7 kg)  10/16/23 177 lb 12.8 oz (80.6 kg)  10/14/23 174 lb (78.9 kg)     GENERAL:alert, no distress and comfortable SKIN: skin color, texture, turgor are normal, no rashes or significant  lesions EYES: normal, Conjunctiva are pink and non-injected, sclera clear NECK: supple, thyroid normal size, non-tender, without nodularity LYMPH:  no palpable lymphadenopathy in the cervical,  axillary  LUNGS: clear to auscultation and percussion with normal breathing effort HEART: regular rate & rhythm and no murmurs and no lower extremity edema ABDOMEN:abdomen soft, non-tender and normal bowel sounds Musculoskeletal:no cyanosis of digits and no clubbing  NEURO: alert & oriented x 3 with fluent speech, no focal motor/sensory deficits  Physical Exam   VITALS: BP- 113/51      LABORATORY DATA:  I have reviewed the data as listed    Latest Ref Rng & Units 11/04/2023   10:50 AM 10/14/2023   10:40 AM 09/23/2023   10:47 AM  CBC  WBC 4.0 - 10.5 K/uL 5.7  7.3  7.3   Hemoglobin 13.0 - 17.0 g/dL 60.4  54.0  98.1   Hematocrit 39.0 - 52.0 % 36.0  36.1  37.6   Platelets 150 - 400 K/uL 166  193  159         Latest Ref Rng & Units 11/04/2023   10:50 AM 10/14/2023   10:40 AM 09/23/2023   10:47 AM  CMP  Glucose 70 - 99 mg/dL 191  478  295   BUN 8 - 23 mg/dL 17  14  19    Creatinine 0.61 - 1.24 mg/dL 6.21  3.08  6.57   Sodium 135 - 145 mmol/L 139  138  138   Potassium 3.5 - 5.1 mmol/L 3.8  4.1  4.1   Chloride 98 - 111 mmol/L 106  105  104   CO2 22 - 32 mmol/L 29  30  29    Calcium 8.9 - 10.3 mg/dL 9.1  9.2  9.3   Total Protein 6.5 - 8.1 g/dL 7.0  6.9  6.8   Total Bilirubin 0.0 - 1.2 mg/dL 0.4  0.4  0.5   Alkaline Phos 38 - 126 U/L 58  51  53   AST 15 - 41 U/L 14  19  15    ALT 0 - 44 U/L 17  21  20        RADIOGRAPHIC STUDIES: I have personally reviewed the radiological images as listed and agreed with the findings in the report. No results found.    Orders Placed This Encounter  Procedures   CBC with Differential (Cancer Center Only)    Standing Status:   Future    Expected Date:   01/06/2024    Expiration Date:   01/05/2025   CMP (Cancer Center only)    Standing Status:   Future     Expected Date:   01/06/2024    Expiration Date:   01/05/2025   T4    Standing Status:   Future    Expected Date:   01/06/2024    Expiration Date:   01/05/2025   TSH    Standing Status:   Future    Expected Date:   01/06/2024    Expiration Date:   01/05/2025   All questions were answered. The patient knows to call the clinic with any problems, questions or concerns. No barriers to learning was detected. The total time spent in the appointment was 25 minutes.     Malachy Mood, MD 11/04/2023

## 2023-11-04 NOTE — Assessment & Plan Note (Signed)
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 He received Intravesicular gemcitabine with a total of 2000 mg weekly X6 started in February 2022  -bladder biopsies, bladder washings on 06/24/2022 showed focal urothelial carcinoma in situ (CIS) and cytology was positive for high grade urothelial carcinoma -I recommended intravesical gemcitabine 2000 mg weekly x 6, he started on 08/28/16.  He tolerated first cycle poorly with significant hematuria, and intermittent fever for week after treatment. -Due to his poor tolerance to intravesical gemcitabine, I changed therapy to intravesical mitomycin 40 mg every week for 5 weeks. He tolerated the treatment very well  -His surveillance cystoscopy and biopsy were negative for residual cancer -Given his good response to intravesical mitomycin, and his high risk of recurrence, Dr. Retta Diones recommend small intravesical therapy.  Patient agrees to proceed, started in in April 2024. Unfortunately he has had recurrent leakage from the intravesical therapy, and not able to continue. -Treatment was switched to [facility] on March 18, 2023, he has been tolerating well overall.  Plan to continue every 3 weeks for up to 2 years if no recurrence.

## 2023-11-04 NOTE — Patient Instructions (Signed)

## 2023-11-05 LAB — T4: T4, Total: 10.2 ug/dL (ref 4.5–12.0)

## 2023-11-06 ENCOUNTER — Encounter: Payer: Self-pay | Admitting: Hematology

## 2023-11-13 ENCOUNTER — Inpatient Hospital Stay: Attending: Hematology | Admitting: Internal Medicine

## 2023-11-13 ENCOUNTER — Other Ambulatory Visit: Payer: Self-pay

## 2023-11-13 VITALS — BP 127/73 | HR 65 | Temp 98.7°F | Resp 17 | Wt 179.6 lb

## 2023-11-13 DIAGNOSIS — Z5112 Encounter for antineoplastic immunotherapy: Secondary | ICD-10-CM | POA: Diagnosis not present

## 2023-11-13 DIAGNOSIS — Z79620 Long term (current) use of immunosuppressive biologic: Secondary | ICD-10-CM | POA: Insufficient documentation

## 2023-11-13 DIAGNOSIS — C670 Malignant neoplasm of trigone of bladder: Secondary | ICD-10-CM | POA: Diagnosis not present

## 2023-11-13 DIAGNOSIS — Z79899 Other long term (current) drug therapy: Secondary | ICD-10-CM | POA: Insufficient documentation

## 2023-11-13 DIAGNOSIS — Z87891 Personal history of nicotine dependence: Secondary | ICD-10-CM | POA: Diagnosis not present

## 2023-11-13 DIAGNOSIS — R251 Tremor, unspecified: Secondary | ICD-10-CM

## 2023-11-13 NOTE — Progress Notes (Signed)
 [name] at [name] [address]  [place], [place] [address] [phone]   New Patient Evaluation  Date of Service: 11/13/23 Patient Name: Keith [name] Patient MRN: [id number] Patient DOB: Dec 09, 1940 Provider: Henreitta Leber, MD  Identifying Statement:  Keith [name] is a 83 y.o. male with Tremor of both hands who presents for initial consultation and evaluation regarding cancer associated neurologic deficits.    Referring Provider: Charlane Ferretti, DO [address] [place],  [place] [address]  Primary Cancer:  Oncologic History: Oncology History Overview Note   Cancer Staging  Bladder cancer [facility]) Staging form: Urinary Bladder, AJCC 8th Edition - Clinical: Stage I (cT1, cN0, cM0) - Signed by Benjiman Core, MD on 09/12/2020 WHO/ISUP grade (low/high): High Grade Histologic grading system: 2 grade system - Pathologic stage from 06/24/2022: Stage 0is (pTis, pN0, cM0) - Signed by Malachy Mood, MD on 08/24/2022 Stage prefix: Initial diagnosis WHO/ISUP grade (low/high): High Grade Histologic grading system: 2 grade system     Bladder cancer (HCC)  09/12/2020 Initial Diagnosis   Bladder cancer (HCC)   09/12/2020 Cancer Staging   Staging form: Urinary Bladder, AJCC 8th Edition - Clinical: Stage I (cT1, cN0, cM0) - Signed by Benjiman Core, MD on 09/12/2020 WHO/ISUP grade (low/high): High Grade Histologic grading system: 2 grade system   06/24/2022 Pathology Results             FINAL MICROSCOPIC DIAGNOSIS:  A. BLADDER, BIOPSY: Focal urothelial carcinoma in situ (CIS)     06/24/2022 Cancer Staging   Staging form: Urinary Bladder, AJCC 8th Edition - Pathologic stage from 06/24/2022: Stage 0is (pTis, pN0, cM0) - Signed by Malachy Mood, MD on 08/24/2022 Stage prefix: Initial diagnosis WHO/ISUP grade (low/high): High Grade Histologic grading system: 2 grade system   08/28/2022 - 08/28/2022 Chemotherapy   Patient is on Treatment Plan :  BLADDER Gemcitabine INTRAVESICAL (2000) q7d     09/12/2022 - 02/26/2023 Chemotherapy   Patient is on Treatment Plan : BLADDER Mitomycin INTRAVESICAL (40 mg) q7d     10/25/2022 Genetic Testing   Negative [facility] CancerNext-Expanded +RNA Panel.  Report date is 10/25/2022.   The CancerNext-Expanded gene panel offered by [facility] and includes sequencing, rearrangement, and RNA analysis for the following 77 genes: AIP, ALK, APC, ATM, AXIN2, BAP1, BARD1, BLM, BMPR1A, BRCA1, BRCA2, BRIP1, CDC73, CDH1, CDK4, CDKN1B, CDKN2A, CHEK2, CTNNA1, DICER1, FANCC, FH, FLCN, GALNT12, KIF1B, LZTR1, MAX, MEN1, MET, MLH1, MSH2, MSH3, MSH6, MUTYH, NBN, NF1, NF2, NTHL1, PALB2, PHOX2B, PMS2, POT1, PRKAR1A, PTCH1, PTEN, RAD51C, RAD51D, RB1, RECQL, RET, SDHA, SDHAF2, SDHB, SDHC, SDHD, SMAD4, SMARCA4, SMARCB1, SMARCE1, STK11, SUFU, TMEM127, TP53, TSC1, TSC2, VHL and XRCC2 (sequencing and deletion/duplication); EGFR, EGLN1, HOXB13, KIT, MITF, PDGFRA, POLD1, and POLE (sequencing only); EPCAM and GREM1 (deletion/duplication only).    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  The propanolol has led to an improvement in his tremor symptoms.  They still occur but are milder.  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  Continues to feel tired and lethargic following immunotherapy infusions, following  with Dr. Mosetta Putt.  H+P (10/16/23) Patient presents today to review new tremor.  He describes action based shaking of both hands, symmetric and rhythmic.  Onset was in August following first infusion of [place].  Since then, he has had severe tremor for 2-3 days  following each infusion.  In between treatments, symptoms are more mild and less intrusive.  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  No other exacerbating feautures, denies other neurologic symptoms.   Medications: Current Outpatient Medications on File Prior to Visit  Medication Sig Dispense Refill   acetaminophen (TYLENOL) 500 MG tablet Take 1,000 mg by mouth every 8 (eight) hours as needed for moderate pain.     amLODipine (NORVASC) 10 MG tablet Take 10 mg by mouth daily.     amoxicillin-clavulanate (AUGMENTIN) 875-125 MG tablet Take 1 tablet by mouth 2 (two) times daily. (Patient not taking: Reported on 10/27/2023) 10 tablet 0   atorvastatin (LIPITOR) 20 MG tablet Take 20 mg by mouth at bedtime.      carteolol (OCUPRESS) 1 % ophthalmic solution Place 1 drop into the right eye every morning.     cetirizine (ZYRTEC) 10 MG tablet Take 10 mg by mouth daily as needed for allergies.     clotrimazole-betamethasone (LOTRISONE) lotion Apply 1 Application topically daily as needed (irritation).     fluticasone (FLONASE) 50 MCG/ACT nasal spray Place 2 sprays into both nostrils daily as needed for allergies.      glimepiride (AMARYL) 1 MG tablet Take 1 mg by mouth daily with breakfast.     ibuprofen (ADVIL) 200 MG tablet Take 600 mg by mouth every 8 (eight) hours as needed for moderate pain.     latanoprost (XALATAN) 0.005 % ophthalmic solution Place 1 drop into the right eye at bedtime.     levothyroxine (SYNTHROID, LEVOTHROID) 100 MCG tablet Take 100 mcg by mouth daily before breakfast.      lidocaine-prilocaine (EMLA) cream Apply 1 Application topically as needed. 30 g 0   losartan (COZAAR) 100 MG tablet Take 100 mg by mouth at bedtime.      meloxicam (MOBIC) 15 MG tablet Take 15 mg by mouth daily.     Misc Natural Products (COMPLETE PROSTATE HEALTH PO) Take 1 tablet by mouth 2 (two) times daily.     omeprazole (PRILOSEC) 20 MG  capsule Take 20 mg by mouth daily.     oxybutynin (DITROPAN-XL) 5 MG 24 hr tablet TAKE 1 TABLET (5 MG TOTAL) BY MOUTH ONCE A WEEK. TAKE 2 HOURS BEFORE SCHEDULED CHEMO IN OUR OFFICE (Patient not taking: Reported on 10/16/2023) 90 tablet 1   oxycodone (OXY-IR) 5 MG capsule Take 5 mg by mouth every 6 (six) hours as needed for pain. (Patient not taking: Reported on 10/16/2023)     propranolol (INDERAL) 20 MG tablet Take 1 tablet (20 mg total) by mouth 2 (two) times daily. 60 tablet 2   sitaGLIPtin (JANUVIA) 100 MG tablet Take 100 mg by mouth every morning.     tamsulosin (FLOMAX) 0.4 MG CAPS capsule Take 0.4 mg by mouth every morning.      vitamin B-12 (CYANOCOBALAMIN) 500 MCG tablet Take 500 mcg by mouth daily.     No current facility-administered medications on file prior to visit.    Allergies: No Known Allergies Past Medical History:  Past Medical History:  Diagnosis Date   Bladder cancer (HCC)    Blind right eye  BPH (benign prostatic hypertrophy)    Cataracts, both eyes    Depression    DM type 2 (diabetes mellitus, type 2) (HCC)    DOE (dyspnea on exertion)    with heavy exertion   Full dentures    GERD (gastroesophageal reflux disease)    Glaucoma no peripheral or side vision   right eye blind   Glaucoma, right eye    HOH (hard of hearing)    Hyperlipidemia    Hypertension    Hypothyroidism    Local recurrence of cancer of urinary bladder [facility]) urologsit-  dr Retta Diones   1st dx 01-29-2016--  now recurrent 05-31-2016   Lower extremity edema 07/11/2020   right swells more than left goes down at night after sleeping   Lower urinary tract symptoms (LUTS) 2019   Migraine    Peripheral neuropathy    middle finger left hand   Pneumonia 2019   Skin abnormality    base of spine looks like pimple has had since 07-01-2020, no drainage area intact   Wears glasses    Past Surgical History:  Past Surgical History:  Procedure Laterality Date   ANTERIOR CERVICAL DECOMP/DISCECTOMY  FUSION  08-15-2000   C5 -- C6   ANTERIOR CERVICAL DECOMP/DISCECTOMY FUSION  03-04-2008   C6 -- C7   areas removed from back at spine  2021 jan and aug 2021   CYSTOSCOPY W/ RETROGRADES Left 05/11/2020   Procedure: Cystoscopy with left retrograde ureteropyelogram, resection of probable bladder tumor and left trigonal/ureteral orifice region, 3 cm in size, fluoroscopic interpretation, left ureteroscopy, placement of 6 French by 26 cm contour double-J stent without tether;  Surgeon: Marcine Matar, MD;  Location: [name];  Service: Urology;  Laterality: Left;   CYSTOSCOPY W/ RETROGRADES Bilateral 06/24/2022   Procedure: CYSTOSCOPY WITH RETROGRADE PYELOGRAM;  Surgeon: Marcine Matar, MD;  Location: [facility];  Service: Urology;  Laterality: Bilateral;  1 HR   CYSTOSCOPY WITH BIOPSY N/A 01/29/2016   Procedure: CYSTOSCOPY WITH BIOPSY;  Surgeon: Marcine Matar, MD;  Location: [facility];  Service: Urology;  Laterality: N/A;   CYSTOSCOPY WITH BIOPSY N/A 05/31/2016   Procedure: CYSTOSCOPY WITH BIOPSY;  Surgeon: Marcine Matar, MD;  Location: [name];  Service: Urology;  Laterality: N/A;   CYSTOSCOPY WITH BIOPSY N/A 06/24/2022   Procedure: CYSTOSCOPY WITH BIOPSY;  Surgeon: Marcine Matar, MD;  Location: [facility];  Service: Urology;  Laterality: N/A;   EYE SURGERY Right 1998 & 2000   [place]   GLAUCOMA SURGERY  1998 and 2000   IR IMAGING GUIDED PORT INSERTION  03/31/2023   lower back  06/01/2019, 02-10-2020   nose cancer  areas removed  2019, 2021   x 2 / 3/ 2021 small area removed from nose   surgery for sacral fracture  40 yrs ago   TRANSURETHRAL RESECTION OF BLADDER TUMOR N/A 07/17/2020   Procedure: TRANSURETHRAL RESECTION OF BLADDER TUMOR (TURBT);  Surgeon: Marcine Matar, MD;  Location: [name];  Service: Urology;  Laterality: N/A;   Social History:  Social History   Socioeconomic History   Marital status:  Married    Spouse name: Not on file   Number of children: 1   Years of education: Not on file   Highest education level: Not on file  Occupational History   Not on file  Tobacco Use   Smoking status: Former    Current packs/day: 0.00    Average packs/day: 1 pack/day for 50.0 years (  50.0 ttl pk-yrs)    Types: Cigarettes    Start date: 06/27/1954    Quit date: 06/27/2004    Years since quitting: 19.3   Smokeless tobacco: Never  Vaping Use   Vaping status: Never Used  Substance and Sexual Activity   Alcohol use: Yes    Alcohol/week: 0.0 standard drinks of alcohol    Comment: occ moonshine garles throat with   Drug use: No   Sexual activity: Not on file  Other Topics Concern   Not on file  Social History Narrative   Not on file   Social Drivers of Health   Financial Resource Strain: Not on file  Food Insecurity: Not on file  Transportation Needs: Not on file  Physical Activity: Not on file  Stress: Not on file  Social Connections: Not on file  Intimate Partner Violence: Not on file   Family History:  Family History  Problem Relation Age of Onset   Heart disease Mother    Heart disease Father    Breast cancer Sister 59   Breast cancer Sister 55       d. 10   Breast cancer Sister 61   Brain cancer Sister        dx and d. 75s   Bladder Cancer Brother 44   Lung cancer Brother        dx after 46   Lung cancer Brother 51   Skin cancer Daughter        melanoma and BCC in 83s   Cancer Nephew        3 nephews with lung, kidney, and unknown ca dx before age 65    Review of Systems: Constitutional: Doesn't report fevers, chills or abnormal weight loss Eyes: Doesn't report blurriness of vision Ears, nose, mouth, throat, and face: Doesn't report sore throat Respiratory: Doesn't report cough, dyspnea or wheezes Cardiovascular: Doesn't report palpitation, chest discomfort  Gastrointestinal:  Doesn't report nausea, constipation, diarrhea GU: Doesn't report  incontinence Skin: Doesn't report skin rashes Neurological: Per HPI Musculoskeletal: Doesn't report joint pain Behavioral/Psych: Doesn't report anxiety  Physical Exam: Vitals:   11/13/23 1051  BP: 127/73  Pulse: 65  Resp: 17  Temp: 98.7 F (37.1 C)  SpO2: 98%    KPS: 80. General: Alert, cooperative, pleasant, in no acute distress Head: Normal EENT: No conjunctival injection or scleral icterus.  Lungs: Resp effort normal Cardiac: Regular rate Abdomen: Non-distended abdomen Skin: No rashes cyanosis or petechiae. Extremities: No clubbing or edema  Neurologic Exam: Mental Status: Awake, alert, attentive to examiner. Oriented to self and environment. Language is fluent with intact comprehension.  Cranial Nerves: Visual acuity is grossly normal. Visual fields are full. Extra-ocular movements intact. No ptosis. Face is symmetric Motor: Tone and bulk are normal. Power is full in both arms and legs. High frequency tremor upon reaching, bilateral hands, low amplitude.  Reflexes are symmetric, no pathologic reflexes present.  Sensory: Intact to light touch Gait: Normal.   Labs: I have reviewed the data as listed    Component Value Date/Time   NA 139 11/04/2023 1050   K 3.8 11/04/2023 1050   CL 106 11/04/2023 1050   CO2 29 11/04/2023 1050   GLUCOSE 207 (H) 11/04/2023 1050   BUN 17 11/04/2023 1050   CREATININE 0.87 11/04/2023 1050   CALCIUM 9.1 11/04/2023 1050   PROT 7.0 11/04/2023 1050   ALBUMIN 4.3 11/04/2023 1050   AST 14 (L) 11/04/2023 1050   ALT 17 11/04/2023 1050   ALKPHOS  58 11/04/2023 1050   BILITOT 0.4 11/04/2023 1050   GFRNONAA >60 11/04/2023 1050   GFRAA >60 05/11/2020 1300   Lab Results  Component Value Date   WBC 5.7 11/04/2023   NEUTROABS 3.3 11/04/2023   HGB 12.3 (L) 11/04/2023   HCT 36.0 (L) 11/04/2023   MCV 91.4 11/04/2023   PLT 166 11/04/2023     Assessment/Plan Tremor of both hands  DERIC BOCOCK presents with clinical syndrome consistent  with intention tremor, bilateral hands.  Etiology is either essential or physiologic.  This is clearly exacerbated by immunotherapy.  He is doing well with propanolol 20mg  BID.  We discussed a trial of incread dose to 40mg ; he will think about this and let us know.  Blood pressure remains normal.  For now he will continue on Keytruda as prior with Dr. Mosetta Putt.  We appreciate the opportunity to participate in the care of AYODEJI KEIMIG.   All questions were answered. The patient knows to call the clinic with any problems, questions or concerns. No barriers to learning were detected.  The total time spent in the encounter was 30 minutes and more than 50% was on counseling and review of test results   Henreitta Leber, MD Medical Director of Neuro-Oncology [name] 11/13/23 10:47 AM

## 2023-11-25 ENCOUNTER — Encounter: Payer: Self-pay | Admitting: Hematology

## 2023-11-25 ENCOUNTER — Inpatient Hospital Stay (HOSPITAL_BASED_OUTPATIENT_CLINIC_OR_DEPARTMENT_OTHER): Admitting: Hematology

## 2023-11-25 ENCOUNTER — Inpatient Hospital Stay

## 2023-11-25 VITALS — BP 122/60 | HR 70 | Temp 98.9°F | Resp 20 | Ht 70.0 in | Wt 180.0 lb

## 2023-11-25 DIAGNOSIS — Z95828 Presence of other vascular implants and grafts: Secondary | ICD-10-CM

## 2023-11-25 DIAGNOSIS — R5383 Other fatigue: Secondary | ICD-10-CM

## 2023-11-25 DIAGNOSIS — C679 Malignant neoplasm of bladder, unspecified: Secondary | ICD-10-CM | POA: Diagnosis not present

## 2023-11-25 DIAGNOSIS — C671 Malignant neoplasm of dome of bladder: Secondary | ICD-10-CM

## 2023-11-25 DIAGNOSIS — Z5112 Encounter for antineoplastic immunotherapy: Secondary | ICD-10-CM | POA: Diagnosis not present

## 2023-11-25 LAB — CBC WITH DIFFERENTIAL (CANCER CENTER ONLY)
Abs Immature Granulocytes: 0.04 10*3/uL (ref 0.00–0.07)
Basophils Absolute: 0.1 10*3/uL (ref 0.0–0.1)
Basophils Relative: 1 %
Eosinophils Absolute: 0.3 10*3/uL (ref 0.0–0.5)
Eosinophils Relative: 5 %
HCT: 36 % — ABNORMAL LOW (ref 39.0–52.0)
Hemoglobin: 12.5 g/dL — ABNORMAL LOW (ref 13.0–17.0)
Immature Granulocytes: 1 %
Lymphocytes Relative: 23 %
Lymphs Abs: 1.5 10*3/uL (ref 0.7–4.0)
MCH: 30.9 pg (ref 26.0–34.0)
MCHC: 34.7 g/dL (ref 30.0–36.0)
MCV: 89.1 fL (ref 80.0–100.0)
Monocytes Absolute: 0.5 10*3/uL (ref 0.1–1.0)
Monocytes Relative: 8 %
Neutro Abs: 3.9 10*3/uL (ref 1.7–7.7)
Neutrophils Relative %: 62 %
Platelet Count: 180 10*3/uL (ref 150–400)
RBC: 4.04 MIL/uL — ABNORMAL LOW (ref 4.22–5.81)
RDW: 12.7 % (ref 11.5–15.5)
WBC Count: 6.4 10*3/uL (ref 4.0–10.5)
nRBC: 0 % (ref 0.0–0.2)

## 2023-11-25 LAB — CMP (CANCER CENTER ONLY)
ALT: 16 U/L (ref 0–44)
AST: 16 U/L (ref 15–41)
Albumin: 4.3 g/dL (ref 3.5–5.0)
Alkaline Phosphatase: 55 U/L (ref 38–126)
Anion gap: 3 — ABNORMAL LOW (ref 5–15)
BUN: 15 mg/dL (ref 8–23)
CO2: 29 mmol/L (ref 22–32)
Calcium: 9.2 mg/dL (ref 8.9–10.3)
Chloride: 107 mmol/L (ref 98–111)
Creatinine: 0.89 mg/dL (ref 0.61–1.24)
GFR, Estimated: >60 mL/min (ref >60)
Glucose, Bld: 164 mg/dL — ABNORMAL HIGH (ref 70–99)
Potassium: 3.8 mmol/L (ref 3.5–5.1)
Sodium: 139 mmol/L (ref 135–145)
Total Bilirubin: 0.4 mg/dL (ref 0.0–1.2)
Total Protein: 7 g/dL (ref 6.5–8.1)

## 2023-11-25 LAB — T4, FREE: Free T4: 1.03 ng/dL (ref 0.61–1.12)

## 2023-11-25 LAB — TSH: TSH: 1.107 u[IU]/mL (ref 0.350–4.500)

## 2023-11-25 MED ORDER — SODIUM CHLORIDE 0.9 % IV SOLN
200.0000 mg | Freq: Once | INTRAVENOUS | Status: AC
  Administered 2023-11-25: 200 mg via INTRAVENOUS
  Filled 2023-11-25: qty 8

## 2023-11-25 MED ORDER — SODIUM CHLORIDE 0.9 % IV SOLN: Freq: Once | INTRAVENOUS | Status: AC

## 2023-11-25 MED ORDER — SODIUM CHLORIDE 0.9% FLUSH
10.0000 mL | INTRAVENOUS | Status: DC | PRN
  Administered 2023-11-25: 10 mL

## 2023-11-25 NOTE — Assessment & Plan Note (Signed)
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 He received Intravesicular gemcitabine with a total of 2000 mg weekly X6 started in February 2022  -bladder biopsies, bladder washings on 06/24/2022 showed focal urothelial carcinoma in situ (CIS) and cytology was positive for high grade urothelial carcinoma -I recommended intravesical gemcitabine 2000 mg weekly x 6, he started on 08/28/16.  He tolerated first cycle poorly with significant hematuria, and intermittent fever for week after treatment. -Due to his poor tolerance to intravesical gemcitabine, I changed therapy to intravesical mitomycin 40 mg every week for 5 weeks. He tolerated the treatment very well  -His surveillance cystoscopy and biopsy were negative for residual cancer -Given his good response to intravesical mitomycin, and his high risk of recurrence, Dr. Retta Diones recommend small intravesical therapy.  Patient agrees to proceed, started in in April 2024. Unfortunately he has had recurrent leakage from the intravesical therapy, and not able to continue. -Treatment was switched to [facility] on March 18, 2023, he has been tolerating well overall.  Plan to continue every 3 weeks for up to 2 years if no recurrence.

## 2023-11-25 NOTE — Progress Notes (Signed)
 [facility]   [phone] [phone]   Clinic Follow up Note   Patient Care Team: Windell Hasty, DO as PCP - General (Internal Medicine) Sonja Sandwich, MD as Attending Physician (Hematology and Oncology) Trent Frizzle, MD as Consulting Physician (Urology)  Date of Service:  11/25/2023  CHIEF COMPLAINT: f/u of breast cancer  CURRENT THERAPY:  Keytruda every 3 weeks  Oncology History   Bladder cancer [facility]) -initially diagnosed in 2017  -He was found to have high-grade urothelial carcinoma without muscle invasion and developed and subsequently BCG refractory disease in December 2021. He received Intravesicular gemcitabine with a total of 2000 mg weekly X6 started in February 2022  -bladder biopsies, bladder washings on 06/24/2022 showed focal urothelial carcinoma in situ (CIS) and cytology was positive for high grade urothelial carcinoma -I recommended intravesical gemcitabine 2000 mg weekly x 6, he started on 08/28/16.  He tolerated first cycle poorly with significant hematuria, and intermittent fever for week after treatment. -Due to his poor tolerance to intravesical gemcitabine, I changed therapy to intravesical mitomycin 40 mg every week for 5 weeks. He tolerated the treatment very well  -His surveillance cystoscopy and biopsy were negative for residual cancer -Given his good response to intravesical mitomycin, and his high risk of recurrence, Dr. Joie Narrow recommend small intravesical therapy.  Patient agrees to proceed, started in in April 2024. Unfortunately he has had recurrent leakage from the intravesical therapy, and not able to continue. -Treatment was switched to Keytruda on March 18, 2023, he has been tolerating well overall.  Plan to continue every 3 weeks for up to 2 years if no recurrence.  Assessment & Plan Bladder cancer Undergoing follow-up for superficial bladder cancer, currently on pembrolizumab therapy. Last cystoscopy was  clear, with the next scheduled for December 01, 2023. Pembrolizumab started in August 2024, with plans to continue for two years if no recurrence. If cancer recurs, treatment may be stopped. Pembrolizumab aims to prevent tumor regrowth. - Continue pembrolizumab therapy - Schedule cystoscopy on December 01, 2023  Chronic back pain Chronic back pain radiating to legs, with multiple back surgeries and a ruptured disc. Pain persists for about a year, not related to pembrolizumab. May benefit from physical therapy. - Refer to a back specialist for evaluation - Consider physical therapy to strengthen muscles and alleviate pain  Leg weakness Leg weakness associated with back pain, posing a risk for falls. May benefit from physical therapy to strengthen muscles and improve stability. - Refer to physical therapy for leg strengthening exercises  Shoulder pain Persistent shoulder pain with a red spot that changes color, previously received cortisone injection. May require another injection. - Consider referral to an orthopedic specialist for shoulder evaluation - Consider physical therapy for shoulder pain management  Benign tremor Tremor managed with propranolol, which is effective and well-tolerated without significant impact on blood pressure or heart rate. Dosage can be adjusted if needed. - Continue propranolol as prescribed - Monitor for effectiveness and adjust dosage if necessary  Urinary frequency Urinary frequency causing irritation, managed with medication as needed. - Continue medication for urinary frequency as needed  Plan -Lab reviewed, will proceed Keytruda today -Follow-up appointment with Dr. Secundino Dach and cystoscopy on December 01, 2023, next pembrolizumab infusion scheduled for Dec 16, 2023. -F/U IN 3 WEEKS  - PT referral for bilateral lower extremity weakness     SUMMARY OF ONCOLOGIC HISTORY: Oncology History Overview Note   Cancer Staging  Bladder cancer [facility]) Staging form:  Urinary Bladder, AJCC  8th Edition - Clinical: Stage I (cT1, cN0, cM0) - Signed by Benjiman Core, MD on 09/12/2020 WHO/ISUP grade (low/high): High Grade Histologic grading system: 2 grade system - Pathologic stage from 06/24/2022: Stage 0is (pTis, pN0, cM0) - Signed by Malachy Mood, MD on 08/24/2022 Stage prefix: Initial diagnosis WHO/ISUP grade (low/high): High Grade Histologic grading system: 2 grade system     Bladder cancer (HCC)  09/12/2020 Initial Diagnosis   Bladder cancer (HCC)   09/12/2020 Cancer Staging   Staging form: Urinary Bladder, AJCC 8th Edition - Clinical: Stage I (cT1, cN0, cM0) - Signed by Benjiman Core, MD on 09/12/2020 WHO/ISUP grade (low/high): High Grade Histologic grading system: 2 grade system   06/24/2022 Pathology Results             FINAL MICROSCOPIC DIAGNOSIS:  A. BLADDER, BIOPSY: Focal urothelial carcinoma in situ (CIS)     06/24/2022 Cancer Staging   Staging form: Urinary Bladder, AJCC 8th Edition - Pathologic stage from 06/24/2022: Stage 0is (pTis, pN0, cM0) - Signed by Malachy Mood, MD on 08/24/2022 Stage prefix: Initial diagnosis WHO/ISUP grade (low/high): High Grade Histologic grading system: 2 grade system   08/28/2022 - 08/28/2022 Chemotherapy   Patient is on Treatment Plan : BLADDER Gemcitabine INTRAVESICAL (2000) q7d     09/12/2022 - 02/26/2023 Chemotherapy   Patient is on Treatment Plan : BLADDER Mitomycin INTRAVESICAL (40 mg) q7d     10/25/2022 Genetic Testing   Negative [facility] CancerNext-Expanded +RNA Panel.  Report date is 10/25/2022.   The CancerNext-Expanded gene panel offered by [facility] and includes sequencing, rearrangement, and RNA analysis for the following 77 genes: AIP, ALK, APC, ATM, AXIN2, BAP1, BARD1, BLM, BMPR1A, BRCA1, BRCA2, BRIP1, CDC73, CDH1, CDK4, CDKN1B, CDKN2A, CHEK2, CTNNA1, DICER1, FANCC, FH, FLCN, GALNT12, KIF1B, LZTR1, MAX, MEN1, MET, MLH1, MSH2, MSH3, MSH6, MUTYH, NBN, NF1, NF2, NTHL1, PALB2, PHOX2B, PMS2,  POT1, PRKAR1A, PTCH1, PTEN, RAD51C, RAD51D, RB1, RECQL, RET, SDHA, SDHAF2, SDHB, SDHC, SDHD, SMAD4, SMARCA4, SMARCB1, SMARCE1, STK11, SUFU, TMEM127, TP53, TSC1, TSC2, VHL and XRCC2 (sequencing and deletion/duplication); EGFR, EGLN1, HOXB13, KIT, MITF, PDGFRA, POLD1, and POLE (sequencing only); EPCAM and GREM1 (deletion/duplication only).    03/18/2023 -  Chemotherapy   Patient is on Treatment Plan : BLADDER Pembrolizumab (200) q21d     Malignant neoplasm of trigone of urinary bladder (HCC)  09/12/2020 Initial Diagnosis   Malignant neoplasm of trigone of urinary bladder (HCC)   09/26/2020 - 10/31/2020 Chemotherapy   Patient is on Treatment Plan : BLADDER Gemcitabine q7d        Discussed the use of AI scribe software for clinical note transcription with the patient, who gave verbal consent to proceed.  History of Present Illness The patient, an 83 year old male with a history of bladder cancer currently on immunotherapy (Keytruda), presents with complaints of fatigue and leg weakness. He reports that his legs often feel like they are "giving out," but denies any falls. He also complains of back pain, which radiates down into his leg and hip. The patient has a history of back surgeries and believes the pain is related to a previous work-related injury rather than his current treatment. He has not seen his back doctor in over two years and is considering a follow-up visit.  In addition to his back pain, the patient also reports shoulder pain. He has received a cortisone shot for this, but the pain persists. He is considering another visit to his orthopedic surgeon for further treatment.  The patient is also on propranolol  for tremors, which he reports has been effective. He has not needed to increase his dosage or take additional medication for this issue.  The patient's companion reports that he has been experiencing increased fatigue and has been "extra tired." She also notes that he has been  complaining about his legs "giving out" and has been experiencing pain in his back and shoulder.     All other systems were reviewed with the patient and are negative.  MEDICAL HISTORY:  Past Medical History:  Diagnosis Date   Bladder cancer (HCC)    Blind right eye    BPH (benign prostatic hypertrophy)    Cataracts, both eyes    Depression    DM type 2 (diabetes mellitus, type 2) (HCC)    DOE (dyspnea on exertion)    with heavy exertion   Full dentures    GERD (gastroesophageal reflux disease)    Glaucoma no peripheral or side vision   right eye blind   Glaucoma, right eye    HOH (hard of hearing)    Hyperlipidemia    Hypertension    Hypothyroidism    Local recurrence of cancer of urinary bladder (HCC) urologsit-  dr Joie Narrow   1st dx 01-29-2016--  now recurrent 05-31-2016   Lower extremity edema 07/11/2020   right swells more than left goes down at night after sleeping   Lower urinary tract symptoms (LUTS) 2019   Migraine    Peripheral neuropathy    middle finger left hand   Pneumonia 2019   Skin abnormality    base of spine looks like pimple has had since 07-01-2020, no drainage area intact   Wears glasses     SURGICAL HISTORY: Past Surgical History:  Procedure Laterality Date   ANTERIOR CERVICAL DECOMP/DISCECTOMY FUSION  08-15-2000   C5 -- C6   ANTERIOR CERVICAL DECOMP/DISCECTOMY FUSION  03-04-2008   C6 -- C7   areas removed from back at spine  2021 jan and aug 2021   CYSTOSCOPY W/ RETROGRADES Left 05/11/2020   Procedure: Cystoscopy with left retrograde ureteropyelogram, resection of probable bladder tumor and left trigonal/ureteral orifice region, 3 cm in size, fluoroscopic interpretation, left ureteroscopy, placement of 6 French by 26 cm contour double-J stent without tether;  Surgeon: Trent Frizzle, MD;  Location: [facility];  Service: Urology;  Laterality: Left;   CYSTOSCOPY W/ RETROGRADES Bilateral 06/24/2022   Procedure: CYSTOSCOPY  WITH RETROGRADE PYELOGRAM;  Surgeon: Trent Frizzle, MD;  Location: [facility];  Service: Urology;  Laterality: Bilateral;  1 HR   CYSTOSCOPY WITH BIOPSY N/A 01/29/2016   Procedure: CYSTOSCOPY WITH BIOPSY;  Surgeon: Trent Frizzle, MD;  Location: [facility];  Service: Urology;  Laterality: N/A;   CYSTOSCOPY WITH BIOPSY N/A 05/31/2016   Procedure: CYSTOSCOPY WITH BIOPSY;  Surgeon: Trent Frizzle, MD;  Location: [facility];  Service: Urology;  Laterality: N/A;   CYSTOSCOPY WITH BIOPSY N/A 06/24/2022   Procedure: CYSTOSCOPY WITH BIOPSY;  Surgeon: Trent Frizzle, MD;  Location: [facility];  Service: Urology;  Laterality: N/A;   EYE SURGERY Right 1998 & 2000   [place]   GLAUCOMA SURGERY  1998 and 2000   IR IMAGING GUIDED PORT INSERTION  03/31/2023   lower back  06/01/2019, 02-10-2020   nose cancer  areas removed  2019, 2021   x 2 / 3/ 2021 small area removed from nose   surgery for sacral fracture  40 yrs ago   TRANSURETHRAL RESECTION OF BLADDER TUMOR N/A 07/17/2020   Procedure:  TRANSURETHRAL RESECTION OF BLADDER TUMOR (TURBT);  Surgeon: Trent Frizzle, MD;  Location: [facility];  Service: Urology;  Laterality: N/A;    I have reviewed the social history and family history with the patient and they are unchanged from previous note.  ALLERGIES:  has no known allergies.  MEDICATIONS:  Current Outpatient Medications  Medication Sig Dispense Refill   acetaminophen (TYLENOL) 500 MG tablet Take 1,000 mg by mouth every 8 (eight) hours as needed for moderate pain.     amLODipine (NORVASC) 10 MG tablet Take 10 mg by mouth daily.     amoxicillin-clavulanate (AUGMENTIN) 875-125 MG tablet Take 1 tablet by mouth 2 (two) times daily. 10 tablet 0   atorvastatin (LIPITOR) 20 MG tablet Take 20 mg by mouth at bedtime.      carteolol (OCUPRESS) 1 % ophthalmic solution Place 1 drop into the right eye every morning.     cetirizine (ZYRTEC) 10 MG tablet Take  10 mg by mouth daily as needed for allergies.     clotrimazole-betamethasone (LOTRISONE) lotion Apply 1 Application topically daily as needed (irritation).     fluticasone (FLONASE) 50 MCG/ACT nasal spray Place 2 sprays into both nostrils daily as needed for allergies.      glimepiride (AMARYL) 1 MG tablet Take 1 mg by mouth daily with breakfast.     ibuprofen (ADVIL) 200 MG tablet Take 600 mg by mouth every 8 (eight) hours as needed for moderate pain.     latanoprost (XALATAN) 0.005 % ophthalmic solution Place 1 drop into the right eye at bedtime.     levothyroxine (SYNTHROID, LEVOTHROID) 100 MCG tablet Take 100 mcg by mouth daily before breakfast.      lidocaine-prilocaine (EMLA) cream Apply 1 Application topically as needed. 30 g 0   losartan (COZAAR) 100 MG tablet Take 100 mg by mouth at bedtime.      meloxicam (MOBIC) 15 MG tablet Take 15 mg by mouth daily.     Misc Natural Products (COMPLETE PROSTATE HEALTH PO) Take 1 tablet by mouth 2 (two) times daily.     omeprazole (PRILOSEC) 20 MG capsule Take 20 mg by mouth daily.     oxybutynin (DITROPAN-XL) 5 MG 24 hr tablet TAKE 1 TABLET (5 MG TOTAL) BY MOUTH ONCE A WEEK. TAKE 2 HOURS BEFORE SCHEDULED CHEMO IN OUR OFFICE 90 tablet 1   oxycodone (OXY-IR) 5 MG capsule Take 5 mg by mouth every 6 (six) hours as needed for pain.     propranolol (INDERAL) 20 MG tablet Take 1 tablet (20 mg total) by mouth 2 (two) times daily. 60 tablet 2   sitaGLIPtin (JANUVIA) 100 MG tablet Take 100 mg by mouth every morning.     tamsulosin (FLOMAX) 0.4 MG CAPS capsule Take 0.4 mg by mouth every morning.      vitamin B-12 (CYANOCOBALAMIN) 500 MCG tablet Take 500 mcg by mouth daily.     No current facility-administered medications for this visit.    PHYSICAL EXAMINATION: ECOG PERFORMANCE STATUS: 2 - Symptomatic, <50% confined to bed  Vitals:   11/25/23 1257  BP: 122/60  Pulse: 70  Resp: 20  Temp: 98.9 F (37.2 C)  SpO2: 98%   Wt Readings from Last 3  Encounters:  11/25/23 180 lb (81.6 kg)  11/13/23 179 lb 9.6 oz (81.5 kg)  11/04/23 180 lb 3.2 oz (81.7 kg)     GENERAL:alert, no distress and comfortable SKIN: skin color, texture, turgor are normal, no rashes or significant lesions EYES: normal,  Conjunctiva are pink and non-injected, sclera clear NECK: supple, thyroid normal size, non-tender, without nodularity LYMPH:  no palpable lymphadenopathy in the cervical, axillary  LUNGS: clear to auscultation and percussion with normal breathing effort HEART: regular rate & rhythm and no murmurs and no lower extremity edema ABDOMEN:abdomen soft, non-tender and normal bowel sounds Musculoskeletal:no cyanosis of digits and no clubbing  NEURO: alert & oriented x 3 with fluent speech, no focal motor/sensory deficits  Physical Exam    LABORATORY DATA:  I have reviewed the data as listed    Latest Ref Rng & Units 11/25/2023   12:22 PM 11/04/2023   10:50 AM 10/14/2023   10:40 AM  CBC  WBC 4.0 - 10.5 K/uL 6.4  5.7  7.3   Hemoglobin 13.0 - 17.0 g/dL 84.6  96.2  95.2   Hematocrit 39.0 - 52.0 % 36.0  36.0  36.1   Platelets 150 - 400 K/uL 180  166  193         Latest Ref Rng & Units 11/25/2023   12:22 PM 11/04/2023   10:50 AM 10/14/2023   10:40 AM  CMP  Glucose 70 - 99 mg/dL 841  324  401   BUN 8 - 23 mg/dL 15  17  14    Creatinine 0.61 - 1.24 mg/dL 0.27  2.53  6.64   Sodium 135 - 145 mmol/L 139  139  138   Potassium 3.5 - 5.1 mmol/L 3.8  3.8  4.1   Chloride 98 - 111 mmol/L 107  106  105   CO2 22 - 32 mmol/L 29  29  30    Calcium 8.9 - 10.3 mg/dL 9.2  9.1  9.2   Total Protein 6.5 - 8.1 g/dL 7.0  7.0  6.9   Total Bilirubin 0.0 - 1.2 mg/dL 0.4  0.4  0.4   Alkaline Phos 38 - 126 U/L 55  58  51   AST 15 - 41 U/L 16  14  19    ALT 0 - 44 U/L 16  17  21        RADIOGRAPHIC STUDIES: I have personally reviewed the radiological images as listed and agreed with the findings in the report. No results found.    Orders Placed This Encounter   Procedures   Ambulatory referral to Physical Therapy    Referral Priority:   Routine    Referral Type:   Physical Medicine    Referral Reason:   Specialty Services Required    Requested Specialty:   Physical Therapy    Number of Visits Requested:   1   All questions were answered. The patient knows to call the clinic with any problems, questions or concerns. No barriers to learning was detected. The total time spent in the appointment was 25 minutes.     Sonja Houghton, MD 11/25/2023

## 2023-11-25 NOTE — Patient Instructions (Signed)

## 2023-12-01 DIAGNOSIS — C678 Malignant neoplasm of overlapping sites of bladder: Secondary | ICD-10-CM | POA: Diagnosis not present

## 2023-12-01 DIAGNOSIS — R3915 Urgency of urination: Secondary | ICD-10-CM | POA: Diagnosis not present

## 2023-12-01 DIAGNOSIS — N5201 Erectile dysfunction due to arterial insufficiency: Secondary | ICD-10-CM | POA: Diagnosis not present

## 2023-12-12 DIAGNOSIS — H409 Unspecified glaucoma: Secondary | ICD-10-CM | POA: Diagnosis not present

## 2023-12-15 NOTE — Assessment & Plan Note (Signed)
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 He received Intravesicular gemcitabine with a total of 2000 mg weekly X6 started in February 2022  -bladder biopsies, bladder washings on 06/24/2022 showed focal urothelial carcinoma in situ (CIS) and cytology was positive for high grade urothelial carcinoma -I recommended intravesical gemcitabine 2000 mg weekly x 6, he started on 08/28/16.  He tolerated first cycle poorly with significant hematuria, and intermittent fever for week after treatment. -Due to his poor tolerance to intravesical gemcitabine, I changed therapy to intravesical mitomycin 40 mg every week for 5 weeks. He tolerated the treatment very well  -His surveillance cystoscopy and biopsy were negative for residual cancer -Given his good response to intravesical mitomycin, and his high risk of recurrence, Dr. Retta Diones recommend small intravesical therapy.  Patient agrees to proceed, started in in April 2024. Unfortunately he has had recurrent leakage from the intravesical therapy, and not able to continue. -Treatment was switched to [facility] on March 18, 2023, he has been tolerating well overall.  Plan to continue every 3 weeks for up to 2 years if no recurrence.

## 2023-12-16 ENCOUNTER — Inpatient Hospital Stay: Attending: Hematology

## 2023-12-16 ENCOUNTER — Inpatient Hospital Stay

## 2023-12-16 ENCOUNTER — Inpatient Hospital Stay (HOSPITAL_BASED_OUTPATIENT_CLINIC_OR_DEPARTMENT_OTHER): Admitting: Hematology

## 2023-12-16 ENCOUNTER — Encounter: Payer: Self-pay | Admitting: Hematology

## 2023-12-16 ENCOUNTER — Telehealth: Payer: Self-pay

## 2023-12-16 VITALS — BP 126/56 | HR 81 | Temp 98.3°F | Resp 20 | Ht 70.0 in | Wt 182.2 lb

## 2023-12-16 DIAGNOSIS — Z95828 Presence of other vascular implants and grafts: Secondary | ICD-10-CM

## 2023-12-16 DIAGNOSIS — C671 Malignant neoplasm of dome of bladder: Secondary | ICD-10-CM

## 2023-12-16 DIAGNOSIS — C670 Malignant neoplasm of trigone of bladder: Secondary | ICD-10-CM | POA: Diagnosis not present

## 2023-12-16 DIAGNOSIS — C679 Malignant neoplasm of bladder, unspecified: Secondary | ICD-10-CM | POA: Diagnosis not present

## 2023-12-16 DIAGNOSIS — Z79620 Long term (current) use of immunosuppressive biologic: Secondary | ICD-10-CM | POA: Diagnosis not present

## 2023-12-16 DIAGNOSIS — Z79899 Other long term (current) drug therapy: Secondary | ICD-10-CM | POA: Insufficient documentation

## 2023-12-16 DIAGNOSIS — Z5112 Encounter for antineoplastic immunotherapy: Secondary | ICD-10-CM | POA: Diagnosis not present

## 2023-12-16 LAB — CBC WITH DIFFERENTIAL (CANCER CENTER ONLY)
Abs Immature Granulocytes: 0.04 10*3/uL (ref 0.00–0.07)
Basophils Absolute: 0.1 10*3/uL (ref 0.0–0.1)
Basophils Relative: 1 %
Eosinophils Absolute: 0.5 10*3/uL (ref 0.0–0.5)
Eosinophils Relative: 7 %
HCT: 36.4 % — ABNORMAL LOW (ref 39.0–52.0)
Hemoglobin: 12.4 g/dL — ABNORMAL LOW (ref 13.0–17.0)
Immature Granulocytes: 1 %
Lymphocytes Relative: 20 %
Lymphs Abs: 1.2 10*3/uL (ref 0.7–4.0)
MCH: 30.3 pg (ref 26.0–34.0)
MCHC: 34.1 g/dL (ref 30.0–36.0)
MCV: 89 fL (ref 80.0–100.0)
Monocytes Absolute: 0.5 10*3/uL (ref 0.1–1.0)
Monocytes Relative: 9 %
Neutro Abs: 3.8 10*3/uL (ref 1.7–7.7)
Neutrophils Relative %: 62 %
Platelet Count: 185 10*3/uL (ref 150–400)
RBC: 4.09 MIL/uL — ABNORMAL LOW (ref 4.22–5.81)
RDW: 12.4 % (ref 11.5–15.5)
WBC Count: 6.2 10*3/uL (ref 4.0–10.5)
nRBC: 0 % (ref 0.0–0.2)

## 2023-12-16 LAB — CMP (CANCER CENTER ONLY)
ALT: 14 U/L (ref 0–44)
AST: 15 U/L (ref 15–41)
Albumin: 4.3 g/dL (ref 3.5–5.0)
Alkaline Phosphatase: 53 U/L (ref 38–126)
Anion gap: 4 — ABNORMAL LOW (ref 5–15)
BUN: 16 mg/dL (ref 8–23)
CO2: 28 mmol/L (ref 22–32)
Calcium: 9 mg/dL (ref 8.9–10.3)
Chloride: 107 mmol/L (ref 98–111)
Creatinine: 0.86 mg/dL (ref 0.61–1.24)
GFR, Estimated: >60 mL/min (ref >60)
Glucose, Bld: 209 mg/dL — ABNORMAL HIGH (ref 70–99)
Potassium: 3.8 mmol/L (ref 3.5–5.1)
Sodium: 139 mmol/L (ref 135–145)
Total Bilirubin: 0.4 mg/dL (ref 0.0–1.2)
Total Protein: 6.9 g/dL (ref 6.5–8.1)

## 2023-12-16 MED ORDER — SODIUM CHLORIDE 0.9% FLUSH
10.0000 mL | INTRAVENOUS | Status: DC | PRN
  Administered 2023-12-16: 10 mL

## 2023-12-16 MED ORDER — SODIUM CHLORIDE 0.9 % IV SOLN
200.0000 mg | Freq: Once | INTRAVENOUS | Status: AC
  Administered 2023-12-16: 200 mg via INTRAVENOUS
  Filled 2023-12-16: qty 8

## 2023-12-16 MED ORDER — SODIUM CHLORIDE 0.9 % IV SOLN: Freq: Once | INTRAVENOUS | Status: AC

## 2023-12-16 NOTE — Progress Notes (Signed)
 [facility]   Telephone:([phone] Fax:([phone]   Clinic Follow up Note   Patient Care Team: Windell Hasty, DO as PCP - General (Internal Medicine) Sonja [place], MD as Attending Physician (Hematology and Oncology) Trent Frizzle, MD as Consulting Physician (Urology)  Date of Service:  12/16/2023  CHIEF COMPLAINT: f/u of bladder cancer   CURRENT THERAPY:  Keytruda  every 3 weeks  Oncology History   Bladder cancer [facility]) -initially diagnosed in 2017  -He was found to have high-grade urothelial carcinoma without muscle invasion and developed and subsequently BCG refractory disease in December 2021. He received Intravesicular gemcitabine  with a total of 2000 mg weekly X6 started in February 2022  -bladder biopsies, bladder washings on 06/24/2022 showed focal urothelial carcinoma in situ (CIS) and cytology was positive for high grade urothelial carcinoma -I recommended intravesical gemcitabine  2000 mg weekly x 6, he started on 08/28/16.  He tolerated first cycle poorly with significant hematuria, and intermittent fever for week after treatment. -Due to his poor tolerance to intravesical gemcitabine , I changed therapy to intravesical mitomycin  40 mg every week for 5 weeks. He tolerated the treatment very well  -His surveillance cystoscopy and biopsy were negative for residual cancer -Given his good response to intravesical mitomycin , and his high risk of recurrence, Dr. Joie Narrow recommend small intravesical therapy.  Patient agrees to proceed, started in in April 2024. Unfortunately he has had recurrent leakage from the intravesical therapy, and not able to continue. -Treatment was switched to Keytruda  on March 18, 2023, he has been tolerating well overall.  Plan to continue every 3 weeks for up to 2 years if no recurrence.  Assessment & Plan Bladder cancer Bladder cancer is managed with ongoing treatment. Recent cystoscopy was clear, indicating no recurrence.  Treatment is planned for two years, with approximately nine months completed. The treatment schedule is every three weeks, with an option to extend to every six weeks, though this may increase fatigue. He prefers to continue with the three-week schedule to minimize fatigue. - Continue treatment regimen every three weeks - Schedule next infusion for May 27 - Request cystoscopy results from urology - Consider extending treatment interval to every six weeks if preferred  Fatigue related to cancer treatment Fatigue is persistent, likely related to ongoing cancer treatment. He reports significant tiredness but can perform daily activities. Fatigue may be exacerbated by the treatment schedule. Physical therapy is recommended to improve strength and reduce fatigue, although he expresses reluctance to participate. - Encourage physical therapy to improve strength and reduce fatigue - Schedule physical therapy appointment - Discuss potential for increased fatigue with extended treatment intervals  Shoulder pain Chronic shoulder pain, described as severe and similar to a toothache. He received a corticosteroid injection approximately three months ago, and the pain persists. A red spot on the shoulder has been present for a couple of months but is not currently visible. - Consider repeat corticosteroid injection for shoulder pain - Monitor shoulder for changes in redness or size  Plan - Lab reviewed, CMP still pending, if adequate, will proceed to treatment today and continue every 3 weeks - Follow-up in 6 weeks - Will request his recent cystoscopy report from [facility]     SUMMARY OF ONCOLOGIC HISTORY: Oncology History Overview Note   Cancer Staging  Bladder cancer [facility]) Staging form: Urinary Bladder, AJCC 8th Edition - Clinical: Stage I (cT1, cN0, cM0) - Signed by Renna Cary, MD on 09/12/2020 WHO/ISUP grade (low/high): High Grade Histologic grading system: 2  grade system - Pathologic  stage from 06/24/2022: Stage 0is (pTis, pN0, cM0) - Signed by Sonja [place], MD on 08/24/2022 Stage prefix: Initial diagnosis WHO/ISUP grade (low/high): High Grade Histologic grading system: 2 grade system     Bladder cancer (HCC)  09/12/2020 Initial Diagnosis   Bladder cancer (HCC)   09/12/2020 Cancer Staging   Staging form: Urinary Bladder, AJCC 8th Edition - Clinical: Stage I (cT1, cN0, cM0) - Signed by Renna Cary, MD on 09/12/2020 WHO/ISUP grade (low/high): High Grade Histologic grading system: 2 grade system   06/24/2022 Pathology Results             FINAL MICROSCOPIC DIAGNOSIS:  A. BLADDER, BIOPSY: Focal urothelial carcinoma in situ (CIS)     06/24/2022 Cancer Staging   Staging form: Urinary Bladder, AJCC 8th Edition - Pathologic stage from 06/24/2022: Stage 0is (pTis, pN0, cM0) - Signed by Sonja [place], MD on 08/24/2022 Stage prefix: Initial diagnosis WHO/ISUP grade (low/high): High Grade Histologic grading system: 2 grade system   08/28/2022 - 08/28/2022 Chemotherapy   Patient is on Treatment Plan : BLADDER Gemcitabine  INTRAVESICAL (2000) q7d     09/12/2022 - 02/26/2023 Chemotherapy   Patient is on Treatment Plan : BLADDER Mitomycin  INTRAVESICAL (40 mg) q7d     10/25/2022 Genetic Testing   Negative [facility] CancerNext-Expanded +RNA Panel.  Report date is 10/25/2022.   The CancerNext-Expanded gene panel offered by [facility] and includes sequencing, rearrangement, and RNA analysis for the following 77 genes: AIP, ALK, APC, ATM, AXIN2, BAP1, BARD1, BLM, BMPR1A, BRCA1, BRCA2, BRIP1, CDC73, CDH1, CDK4, CDKN1B, CDKN2A, CHEK2, CTNNA1, DICER1, FANCC, FH, FLCN, GALNT12, KIF1B, LZTR1, MAX, MEN1, MET, MLH1, MSH2, MSH3, MSH6, MUTYH, NBN, NF1, NF2, NTHL1, PALB2, PHOX2B, PMS2, POT1, PRKAR1A, PTCH1, PTEN, RAD51C, RAD51D, RB1, RECQL, RET, SDHA, SDHAF2, SDHB, SDHC, SDHD, SMAD4, SMARCA4, SMARCB1, SMARCE1, STK11, SUFU, TMEM127, TP53, TSC1, TSC2, VHL and XRCC2 (sequencing and  deletion/duplication); EGFR, EGLN1, HOXB13, KIT, MITF, PDGFRA, POLD1, and POLE (sequencing only); EPCAM and GREM1 (deletion/duplication only).    03/18/2023 -  Chemotherapy   Patient is on Treatment Plan : BLADDER Pembrolizumab  (200) q21d     Malignant neoplasm of trigone of urinary bladder (HCC)  09/12/2020 Initial Diagnosis   Malignant neoplasm of trigone of urinary bladder (HCC)   09/26/2020 - 10/31/2020 Chemotherapy   Patient is on Treatment Plan : BLADDER Gemcitabine  q7d        Discussed the use of AI scribe software for clinical note transcription with the patient, who gave verbal consent to proceed.  History of Present Illness Keith [name] is an 83 year old male with bladder cancer who presents for follow-up.  He experiences persistent fatigue, which limits his activities, though he remains able to care for himself. Fatigue is noted as a side effect of his bladder cancer treatment, which he has been receiving since August, with cycles every three weeks.  He underwent a cystoscopy two to three weeks ago, which was clear.  He reports weakness in his legs and feet, describing them as 'giving out,' but has not fallen and does not use a cane.  He has significant shoulder pain, described as 'like a toothache,' ongoing for a couple of months. A previous injection provided some relief. A red spot on his shoulder has been present for a couple of months but was not visible during the visit.     All other systems were reviewed with the patient and are negative.  MEDICAL HISTORY:  Past Medical History:  Diagnosis Date  Bladder cancer (HCC)    Blind right eye    BPH (benign prostatic hypertrophy)    Cataracts, both eyes    Depression    DM type 2 (diabetes mellitus, type 2) (HCC)    DOE (dyspnea on exertion)    with heavy exertion   Full dentures    GERD (gastroesophageal reflux disease)    Glaucoma no peripheral or side vision   right eye blind   Glaucoma, right eye    HOH  (hard of hearing)    Hyperlipidemia    Hypertension    Hypothyroidism    Local recurrence of cancer of urinary bladder [facility]) urologsit-  dr Joie Narrow   1st dx 01-29-2016--  now recurrent 05-31-2016   Lower extremity edema 07/11/2020   right swells more than left goes down at night after sleeping   Lower urinary tract symptoms (LUTS) 2019   Migraine    Peripheral neuropathy    middle finger left hand   Pneumonia 2019   Skin abnormality    base of spine looks like pimple has had since 07-01-2020, no drainage area intact   Wears glasses     SURGICAL HISTORY: Past Surgical History:  Procedure Laterality Date   ANTERIOR CERVICAL DECOMP/DISCECTOMY FUSION  08-15-2000   C5 -- C6   ANTERIOR CERVICAL DECOMP/DISCECTOMY FUSION  03-04-2008   C6 -- C7   areas removed from back at spine  2021 jan and aug 2021   CYSTOSCOPY W/ RETROGRADES Left 05/11/2020   Procedure: Cystoscopy with left retrograde ureteropyelogram, resection of probable bladder tumor and left trigonal/ureteral orifice region, 3 cm in size, fluoroscopic interpretation, left ureteroscopy, placement of 6 French by 26 cm contour double-J stent without tether;  Surgeon: Trent Frizzle, MD;  Location: [facility];  Service: Urology;  Laterality: Left;   CYSTOSCOPY W/ RETROGRADES Bilateral 06/24/2022   Procedure: CYSTOSCOPY WITH RETROGRADE PYELOGRAM;  Surgeon: Trent Frizzle, MD;  Location: [facility];  Service: Urology;  Laterality: Bilateral;  1 HR   CYSTOSCOPY WITH BIOPSY N/A 01/29/2016   Procedure: CYSTOSCOPY WITH BIOPSY;  Surgeon: Trent Frizzle, MD;  Location: [facility];  Service: Urology;  Laterality: N/A;   CYSTOSCOPY WITH BIOPSY N/A 05/31/2016   Procedure: CYSTOSCOPY WITH BIOPSY;  Surgeon: Trent Frizzle, MD;  Location: [facility];  Service: Urology;  Laterality: N/A;   CYSTOSCOPY WITH BIOPSY N/A 06/24/2022   Procedure: CYSTOSCOPY WITH BIOPSY;  Surgeon: Trent Frizzle,  MD;  Location: [facility];  Service: Urology;  Laterality: N/A;   EYE SURGERY Right 1998 & 2000   [place]   GLAUCOMA SURGERY  1998 and 2000   IR IMAGING GUIDED PORT INSERTION  03/31/2023   lower back  06/01/2019, 02-10-2020   nose cancer  areas removed  2019, 2021   x 2 / 3/ 2021 small area removed from nose   surgery for sacral fracture  40 yrs ago   TRANSURETHRAL RESECTION OF BLADDER TUMOR N/A 07/17/2020   Procedure: TRANSURETHRAL RESECTION OF BLADDER TUMOR (TURBT);  Surgeon: Trent Frizzle, MD;  Location: [facility];  Service: Urology;  Laterality: N/A;    I have reviewed the social history and family history with the patient and they are unchanged from previous note.  ALLERGIES:  has no known allergies.  MEDICATIONS:  Current Outpatient Medications  Medication Sig Dispense Refill   acetaminophen  (TYLENOL ) 500 MG tablet Take 1,000 mg by mouth every 8 (eight) hours as needed for moderate pain.     amLODipine  (NORVASC )  10 MG tablet Take 10 mg by mouth daily.     amoxicillin -clavulanate (AUGMENTIN ) 875-125 MG tablet Take 1 tablet by mouth 2 (two) times daily. 10 tablet 0   atorvastatin  (LIPITOR) 20 MG tablet Take 20 mg by mouth at bedtime.      carteolol (OCUPRESS) 1 % ophthalmic solution Place 1 drop into the right eye every morning.     cetirizine (ZYRTEC) 10 MG tablet Take 10 mg by mouth daily as needed for allergies.     clotrimazole-betamethasone  (LOTRISONE) lotion Apply 1 Application topically daily as needed (irritation).     fluticasone  (FLONASE ) 50 MCG/ACT nasal spray Place 2 sprays into both nostrils daily as needed for allergies.      glimepiride  (AMARYL ) 1 MG tablet Take 1 mg by mouth daily with breakfast.     ibuprofen (ADVIL) 200 MG tablet Take 600 mg by mouth every 8 (eight) hours as needed for moderate pain.     latanoprost (XALATAN) 0.005 % ophthalmic solution Place 1 drop into the right eye at bedtime.     levothyroxine  (SYNTHROID , LEVOTHROID) 100 MCG  tablet Take 100 mcg by mouth daily before breakfast.      lidocaine -prilocaine  (EMLA ) cream Apply 1 Application topically as needed. 30 g 0   losartan  (COZAAR ) 100 MG tablet Take 100 mg by mouth at bedtime.      meloxicam (MOBIC) 15 MG tablet Take 15 mg by mouth daily.     Misc Natural Products (COMPLETE PROSTATE HEALTH PO) Take 1 tablet by mouth 2 (two) times daily.     omeprazole (PRILOSEC) 20 MG capsule Take 20 mg by mouth daily.     oxybutynin  (DITROPAN -XL) 5 MG 24 hr tablet TAKE 1 TABLET (5 MG TOTAL) BY MOUTH ONCE A WEEK. TAKE 2 HOURS BEFORE SCHEDULED CHEMO IN OUR OFFICE 90 tablet 1   oxycodone  (OXY-IR) 5 MG capsule Take 5 mg by mouth every 6 (six) hours as needed for pain.     propranolol  (INDERAL ) 20 MG tablet Take 1 tablet (20 mg total) by mouth 2 (two) times daily. 60 tablet 2   sitaGLIPtin (JANUVIA) 100 MG tablet Take 100 mg by mouth every morning.     tamsulosin  (FLOMAX ) 0.4 MG CAPS capsule Take 0.4 mg by mouth every morning.      vitamin B-12 (CYANOCOBALAMIN ) 500 MCG tablet Take 500 mcg by mouth daily.     No current facility-administered medications for this visit.    PHYSICAL EXAMINATION: ECOG PERFORMANCE STATUS: 1 - Symptomatic but completely ambulatory  Vitals:   12/16/23 0922  BP: (!) 126/56  Pulse: 81  Resp: 20  Temp: 98.3 F (36.8 C)  SpO2: 98%   Wt Readings from Last 3 Encounters:  12/16/23 182 lb 3.2 oz (82.6 kg)  11/25/23 180 lb (81.6 kg)  11/13/23 179 lb 9.6 oz (81.5 kg)     GENERAL:alert, no distress and comfortable SKIN: skin color, texture, turgor are normal, no rashes or significant lesions EYES: normal, Conjunctiva are pink and non-injected, sclera clear NECK: supple, thyroid  normal size, non-tender, without nodularity LYMPH:  no palpable lymphadenopathy in the cervical, axillary  LUNGS: clear to auscultation and percussion with normal breathing effort HEART: regular rate & rhythm and no murmurs and no lower extremity edema ABDOMEN:abdomen soft,  non-tender and normal bowel sounds Musculoskeletal:no cyanosis of digits and no clubbing  NEURO: alert & oriented x 3 with fluent speech, no focal motor/sensory deficits  Physical Exam    LABORATORY DATA:  I have reviewed the data  as listed    Latest Ref Rng & Units 12/16/2023    8:57 AM 11/25/2023   12:22 PM 11/04/2023   10:50 AM  CBC  WBC 4.0 - 10.5 K/uL 6.2  6.4  5.7   Hemoglobin 13.0 - 17.0 g/dL 91.4  78.2  95.6   Hematocrit 39.0 - 52.0 % 36.4  36.0  36.0   Platelets 150 - 400 K/uL 185  180  166         Latest Ref Rng & Units 11/25/2023   12:22 PM 11/04/2023   10:50 AM 10/14/2023   10:40 AM  CMP  Glucose 70 - 99 mg/dL 213  086  578   BUN 8 - 23 mg/dL 15  17  14    Creatinine 0.61 - 1.24 mg/dL 4.69  6.29  5.28   Sodium 135 - 145 mmol/L 139  139  138   Potassium 3.5 - 5.1 mmol/L 3.8  3.8  4.1   Chloride 98 - 111 mmol/L 107  106  105   CO2 22 - 32 mmol/L 29  29  30    Calcium  8.9 - 10.3 mg/dL 9.2  9.1  9.2   Total Protein 6.5 - 8.1 g/dL 7.0  7.0  6.9   Total Bilirubin 0.0 - 1.2 mg/dL 0.4  0.4  0.4   Alkaline Phos 38 - 126 U/L 55  58  51   AST 15 - 41 U/L 16  14  19    ALT 0 - 44 U/L 16  17  21        RADIOGRAPHIC STUDIES: I have personally reviewed the radiological images as listed and agreed with the findings in the report. No results found.    Orders Placed This Encounter  Procedures   CBC with Differential (Cancer Center Only)    Standing Status:   Future    Expected Date:   01/27/2024    Expiration Date:   01/26/2025   CMP (Cancer Center only)    Standing Status:   Future    Expected Date:   01/27/2024    Expiration Date:   01/26/2025   CBC with Differential (Cancer Center Only)    Standing Status:   Future    Expected Date:   02/17/2024    Expiration Date:   02/16/2025   CMP (Cancer Center only)    Standing Status:   Future    Expected Date:   02/17/2024    Expiration Date:   02/16/2025   CBC with Differential (Cancer Center Only)    Standing Status:   Future     Expected Date:   03/09/2024    Expiration Date:   03/09/2025   CMP (Cancer Center only)    Standing Status:   Future    Expected Date:   03/09/2024    Expiration Date:   03/09/2025   T4    Standing Status:   Future    Expected Date:   03/09/2024    Expiration Date:   03/09/2025   TSH    Standing Status:   Future    Expected Date:   03/09/2024    Expiration Date:   03/09/2025   All questions were answered. The patient knows to call the clinic with any problems, questions or concerns. No barriers to learning was detected. The total time spent in the appointment was 25 minutes.     Sonja [place], MD 12/16/2023

## 2023-12-16 NOTE — Patient Instructions (Signed)
 [facility] - A DEPT OF [facility]  Discharge Instructions: Thank you for choosing [facility] to provide your oncology and hematology care.   If you have a lab appointment with the Cancer Center, please go directly to the Cancer Center and check in at the registration area.   Wear comfortable clothing and clothing appropriate for easy access to any Portacath or PICC line.   We strive to give you quality time with your provider. You may need to reschedule your appointment if you arrive late (15 or more minutes).  Arriving late affects you and other patients whose appointments are after yours.  Also, if you miss three or more appointments without notifying the office, you may be dismissed from the clinic at the provider's discretion.      For prescription refill requests, have your pharmacy contact our office and allow 72 hours for refills to be completed.    Today you received the following chemotherapy and/or immunotherapy agents Rande Lawman      To help prevent nausea and vomiting after your treatment, we encourage you to take your nausea medication as directed.  BELOW ARE SYMPTOMS THAT SHOULD BE REPORTED IMMEDIATELY: *FEVER GREATER THAN 100.4 F (38 C) OR HIGHER *CHILLS OR SWEATING *NAUSEA AND VOMITING THAT IS NOT CONTROLLED WITH YOUR NAUSEA MEDICATION *UNUSUAL SHORTNESS OF BREATH *UNUSUAL BRUISING OR BLEEDING *URINARY PROBLEMS (pain or burning when urinating, or frequent urination) *BOWEL PROBLEMS (unusual diarrhea, constipation, pain near the anus) TENDERNESS IN MOUTH AND THROAT WITH OR WITHOUT PRESENCE OF ULCERS (sore throat, sores in mouth, or a toothache) UNUSUAL RASH, SWELLING OR PAIN  UNUSUAL VAGINAL DISCHARGE OR ITCHING   Items with * indicate a potential emergency and should be followed up as soon as possible or go to the Emergency Department if any problems should occur.  Please show the CHEMOTHERAPY ALERT CARD or IMMUNOTHERAPY  ALERT CARD at check-in to the Emergency Department and triage nurse.  Should you have questions after your visit or need to cancel or reschedule your appointment, please contact [facility]  Dept: [phone]  and follow the prompts.  Office hours are 8:00 a.m. to 4:30 p.m. Monday - Friday. Please note that voicemails left after 4:00 p.m. may not be returned until the following business day.  We are closed weekends and major holidays. You have access to a nurse at all times for urgent questions. Please call the main number to the clinic Dept: [phone] and follow the prompts.   For any non-urgent questions, you may also contact your provider using MyChart. We now offer e-Visits for anyone 85 and older to request care online for non-urgent symptoms. For details visit [email or web address].   Also download the MyChart app! Go to the app store, search "MyChart", open the app, select , and log in with your MyChart username and password.

## 2023-12-16 NOTE — Telephone Encounter (Addendum)
 Faxed office visit note to [facility] per Dr. Maryalice Smaller. Requested patient's cystoscopy report, per Dr. Maryalice Smaller. Confirmation received.    ----- Message from Sonja [place] sent at 12/16/2023  9:41 AM EDT ----- Please send my OV note to [facility] and request his recent cystoscopy report, thx

## 2023-12-26 ENCOUNTER — Other Ambulatory Visit: Payer: Self-pay | Admitting: Internal Medicine

## 2024-01-06 ENCOUNTER — Inpatient Hospital Stay

## 2024-01-06 ENCOUNTER — Ambulatory Visit: Admitting: Hematology

## 2024-01-06 VITALS — BP 146/64 | HR 70 | Temp 98.0°F | Resp 18

## 2024-01-06 DIAGNOSIS — Z5112 Encounter for antineoplastic immunotherapy: Secondary | ICD-10-CM | POA: Diagnosis not present

## 2024-01-06 DIAGNOSIS — Z95828 Presence of other vascular implants and grafts: Secondary | ICD-10-CM

## 2024-01-06 DIAGNOSIS — C671 Malignant neoplasm of dome of bladder: Secondary | ICD-10-CM

## 2024-01-06 LAB — CMP (CANCER CENTER ONLY)
ALT: 14 U/L (ref 0–44)
AST: 16 U/L (ref 15–41)
Albumin: 4.2 g/dL (ref 3.5–5.0)
Alkaline Phosphatase: 52 U/L (ref 38–126)
Anion gap: 6 (ref 5–15)
BUN: 16 mg/dL (ref 8–23)
CO2: 27 mmol/L (ref 22–32)
Calcium: 9 mg/dL (ref 8.9–10.3)
Chloride: 105 mmol/L (ref 98–111)
Creatinine: 0.87 mg/dL (ref 0.61–1.24)
GFR, Estimated: >60 mL/min (ref >60)
Glucose, Bld: 260 mg/dL — ABNORMAL HIGH (ref 70–99)
Potassium: 3.8 mmol/L (ref 3.5–5.1)
Sodium: 138 mmol/L (ref 135–145)
Total Bilirubin: 0.4 mg/dL (ref 0.0–1.2)
Total Protein: 6.8 g/dL (ref 6.5–8.1)

## 2024-01-06 LAB — TSH: TSH: 1.67 u[IU]/mL (ref 0.350–4.500)

## 2024-01-06 LAB — CBC WITH DIFFERENTIAL (CANCER CENTER ONLY)
Abs Immature Granulocytes: 0.02 10*3/uL (ref 0.00–0.07)
Basophils Absolute: 0.1 10*3/uL (ref 0.0–0.1)
Basophils Relative: 1 %
Eosinophils Absolute: 0.3 10*3/uL (ref 0.0–0.5)
Eosinophils Relative: 5 %
HCT: 36.3 % — ABNORMAL LOW (ref 39.0–52.0)
Hemoglobin: 12.5 g/dL — ABNORMAL LOW (ref 13.0–17.0)
Immature Granulocytes: 0 %
Lymphocytes Relative: 20 %
Lymphs Abs: 1.3 10*3/uL (ref 0.7–4.0)
MCH: 30.9 pg (ref 26.0–34.0)
MCHC: 34.4 g/dL (ref 30.0–36.0)
MCV: 89.6 fL (ref 80.0–100.0)
Monocytes Absolute: 0.5 10*3/uL (ref 0.1–1.0)
Monocytes Relative: 8 %
Neutro Abs: 4.3 10*3/uL (ref 1.7–7.7)
Neutrophils Relative %: 66 %
Platelet Count: 165 10*3/uL (ref 150–400)
RBC: 4.05 MIL/uL — ABNORMAL LOW (ref 4.22–5.81)
RDW: 12.2 % (ref 11.5–15.5)
WBC Count: 6.5 10*3/uL (ref 4.0–10.5)
nRBC: 0 % (ref 0.0–0.2)

## 2024-01-06 MED ORDER — SODIUM CHLORIDE 0.9% FLUSH
10.0000 mL | INTRAVENOUS | Status: DC | PRN
  Administered 2024-01-06: 10 mL

## 2024-01-06 MED ORDER — SODIUM CHLORIDE 0.9 % IV SOLN
200.0000 mg | Freq: Once | INTRAVENOUS | Status: AC
  Administered 2024-01-06: 200 mg via INTRAVENOUS
  Filled 2024-01-06: qty 8

## 2024-01-06 MED ORDER — SODIUM CHLORIDE 0.9 % IV SOLN: Freq: Once | INTRAVENOUS | Status: AC

## 2024-01-06 MED ORDER — HEPARIN SOD (PORK) LOCK FLUSH 100 UNIT/ML IV SOLN
500.0000 [IU] | Freq: Once | INTRAVENOUS | Status: AC | PRN
  Administered 2024-01-06: 500 [IU]

## 2024-01-06 NOTE — Patient Instructions (Signed)
 [facility]  Discharge Instructions: Thank you for choosing [facility] to provide your oncology and hematology care.   If you have a lab appointment with the Cancer Center, please go directly to the Cancer Center and check in at the registration area.   Wear comfortable clothing and clothing appropriate for easy access to any Portacath or PICC line.   We strive to give you quality time with your provider. You may need to reschedule your appointment if you arrive late (15 or more minutes).  Arriving late affects you and other patients whose appointments are after yours.  Also, if you miss three or more appointments without notifying the office, you may be dismissed from the clinic at the provider's discretion.      For prescription refill requests, have your pharmacy contact our office and allow 72 hours for refills to be completed.    Today you received the following chemotherapy and/or immunotherapy agents: pembrolizumab      To help prevent nausea and vomiting after your treatment, we encourage you to take your nausea medication as directed.  BELOW ARE SYMPTOMS THAT SHOULD BE REPORTED IMMEDIATELY: *FEVER GREATER THAN 100.4 F (38 C) OR HIGHER *CHILLS OR SWEATING *NAUSEA AND VOMITING THAT IS NOT CONTROLLED WITH YOUR NAUSEA MEDICATION *UNUSUAL SHORTNESS OF BREATH *UNUSUAL BRUISING OR BLEEDING *URINARY PROBLEMS (pain or burning when urinating, or frequent urination) *BOWEL PROBLEMS (unusual diarrhea, constipation, pain near the anus) TENDERNESS IN MOUTH AND THROAT WITH OR WITHOUT PRESENCE OF ULCERS (sore throat, sores in mouth, or a toothache) UNUSUAL RASH, SWELLING OR PAIN  UNUSUAL VAGINAL DISCHARGE OR ITCHING   Items with * indicate a potential emergency and should be followed up as soon as possible or go to the Emergency Department if any problems should occur.  Please show the CHEMOTHERAPY ALERT CARD or  IMMUNOTHERAPY ALERT CARD at check-in to the Emergency Department and triage nurse.  Should you have questions after your visit or need to cancel or reschedule your appointment, please contact [facility]  Dept: [phone]  and follow the prompts.  Office hours are 8:00 a.m. to 4:30 p.m. Monday - Friday. Please note that voicemails left after 4:00 p.m. may not be returned until the following business day.  We are closed weekends and major holidays. You have access to a nurse at all times for urgent questions. Please call the main number to the clinic Dept: [phone] and follow the prompts.   For any non-urgent questions, you may also contact your provider using MyChart. We now offer e-Visits for anyone 78 and older to request care online for non-urgent symptoms. For details visit [email or web address].   Also download the MyChart app! Go to the app store, search "MyChart", open the app, select [place], and log in with your MyChart username and password.

## 2024-01-07 LAB — T4: T4, Total: 10.2 ug/dL (ref 4.5–12.0)

## 2024-01-12 DIAGNOSIS — H40021 Open angle with borderline findings, high risk, right eye: Secondary | ICD-10-CM | POA: Diagnosis not present

## 2024-01-12 DIAGNOSIS — Z961 Presence of intraocular lens: Secondary | ICD-10-CM | POA: Diagnosis not present

## 2024-01-14 ENCOUNTER — Telehealth: Payer: Self-pay

## 2024-01-14 ENCOUNTER — Other Ambulatory Visit: Payer: Self-pay

## 2024-01-14 DIAGNOSIS — E785 Hyperlipidemia, unspecified: Secondary | ICD-10-CM | POA: Diagnosis not present

## 2024-01-14 DIAGNOSIS — G894 Chronic pain syndrome: Secondary | ICD-10-CM | POA: Diagnosis not present

## 2024-01-14 DIAGNOSIS — N401 Enlarged prostate with lower urinary tract symptoms: Secondary | ICD-10-CM | POA: Diagnosis not present

## 2024-01-14 DIAGNOSIS — R42 Dizziness and giddiness: Secondary | ICD-10-CM | POA: Diagnosis not present

## 2024-01-14 DIAGNOSIS — E039 Hypothyroidism, unspecified: Secondary | ICD-10-CM | POA: Diagnosis not present

## 2024-01-14 DIAGNOSIS — R5381 Other malaise: Secondary | ICD-10-CM | POA: Diagnosis not present

## 2024-01-14 DIAGNOSIS — E1169 Type 2 diabetes mellitus with other specified complication: Secondary | ICD-10-CM | POA: Diagnosis not present

## 2024-01-14 DIAGNOSIS — C679 Malignant neoplasm of bladder, unspecified: Secondary | ICD-10-CM | POA: Diagnosis not present

## 2024-01-14 DIAGNOSIS — G3184 Mild cognitive impairment, so stated: Secondary | ICD-10-CM | POA: Diagnosis not present

## 2024-01-14 DIAGNOSIS — G319 Degenerative disease of nervous system, unspecified: Secondary | ICD-10-CM | POA: Diagnosis not present

## 2024-01-14 DIAGNOSIS — S46011A Strain of muscle(s) and tendon(s) of the rotator cuff of right shoulder, initial encounter: Secondary | ICD-10-CM | POA: Diagnosis not present

## 2024-01-14 DIAGNOSIS — I10 Essential (primary) hypertension: Secondary | ICD-10-CM | POA: Diagnosis not present

## 2024-01-14 NOTE — Telephone Encounter (Signed)
 Pt's wife called wanting to know if their is a PT/OT facility closer to their home.  Pt's wife stated the pt is hesitant about going so far for PT and doesn't want to go.  Stated this nurse is not from this area and is unaware of a PT/OT facility close to their home in [place].  Pt's wife stated the facility that was in [place] is no longer and business but doesn't know of any other PT/OT facility near them.  Pt's wife stated she will see if their is one closer to there home so they don't have to drive as far but would also like for Dr. Maryalice Smaller to know why the pt has not for PT.   Stated this nurse will notify Dr. Maryalice Smaller and her Team.

## 2024-01-15 ENCOUNTER — Encounter: Payer: Self-pay | Admitting: Hematology

## 2024-01-16 ENCOUNTER — Other Ambulatory Visit: Payer: Self-pay

## 2024-01-16 DIAGNOSIS — R5383 Other fatigue: Secondary | ICD-10-CM

## 2024-01-16 DIAGNOSIS — C671 Malignant neoplasm of dome of bladder: Secondary | ICD-10-CM

## 2024-01-16 NOTE — Progress Notes (Signed)
 Faxed referral to [facility] for PT/OT as per Dr. Maryalice Smaller, faxed copy of referral, last office note, demographics and copy of insurance card. Received confirmation of receipt. Called patients wife made her aware and she stated she would call next week to get it set up.

## 2024-01-19 ENCOUNTER — Other Ambulatory Visit: Payer: Self-pay

## 2024-01-26 NOTE — Progress Notes (Signed)
 [facility]   [phone] Fax:([phone]) [phone]    Patient Care Team: Windell Hasty, DO as PCP - General (Internal Medicine) Sonja Bogata, MD as Attending Physician (Hematology and Oncology) Trent Frizzle, MD as Consulting Physician (Urology)   CHIEF COMPLAINT: Follow up bladder cancer   Oncology History Overview Note   Cancer Staging  Bladder cancer [facility]) Staging form: Urinary Bladder, AJCC 8th Edition - Clinical: Stage I (cT1, cN0, cM0) - Signed by Renna Cary, MD on 09/12/2020 WHO/ISUP grade (low/high): High Grade Histologic grading system: 2 grade system - Pathologic stage from 06/24/2022: Stage 0is (pTis, pN0, cM0) - Signed by Sonja [place], MD on 08/24/2022 Stage prefix: Initial diagnosis WHO/ISUP grade (low/high): High Grade Histologic grading system: 2 grade system     Bladder cancer (HCC)  09/12/2020 Initial Diagnosis   Bladder cancer (HCC)   09/12/2020 Cancer Staging   Staging form: Urinary Bladder, AJCC 8th Edition - Clinical: Stage I (cT1, cN0, cM0) - Signed by Renna Cary, MD on 09/12/2020 WHO/ISUP grade (low/high): High Grade Histologic grading system: 2 grade system   06/24/2022 Pathology Results             FINAL MICROSCOPIC DIAGNOSIS:  A. BLADDER, BIOPSY: Focal urothelial carcinoma in situ (CIS)     06/24/2022 Cancer Staging   Staging form: Urinary Bladder, AJCC 8th Edition - Pathologic stage from 06/24/2022: Stage 0is (pTis, pN0, cM0) - Signed by Sonja Merriam Woods, MD on 08/24/2022 Stage prefix: Initial diagnosis WHO/ISUP grade (low/high): High Grade Histologic grading system: 2 grade system   08/28/2022 - 08/28/2022 Chemotherapy   Patient is on Treatment Plan : BLADDER Gemcitabine  INTRAVESICAL (2000) q7d     09/12/2022 - 02/26/2023 Chemotherapy   Patient is on Treatment Plan : BLADDER Mitomycin  INTRAVESICAL (40 mg) q7d     10/25/2022 Genetic Testing   Negative [facility] CancerNext-Expanded +RNA Panel.  Report date is  10/25/2022.   The CancerNext-Expanded gene panel offered by [facility] and includes sequencing, rearrangement, and RNA analysis for the following 77 genes: AIP, ALK, APC, ATM, AXIN2, BAP1, BARD1, BLM, BMPR1A, BRCA1, BRCA2, BRIP1, CDC73, CDH1, CDK4, CDKN1B, CDKN2A, CHEK2, CTNNA1, DICER1, FANCC, FH, FLCN, GALNT12, KIF1B, LZTR1, MAX, MEN1, MET, MLH1, MSH2, MSH3, MSH6, MUTYH, NBN, NF1, NF2, NTHL1, PALB2, PHOX2B, PMS2, POT1, PRKAR1A, PTCH1, PTEN, RAD51C, RAD51D, RB1, RECQL, RET, SDHA, SDHAF2, SDHB, SDHC, SDHD, SMAD4, SMARCA4, SMARCB1, SMARCE1, STK11, SUFU, TMEM127, TP53, TSC1, TSC2, VHL and XRCC2 (sequencing and deletion/duplication); EGFR, EGLN1, HOXB13, KIT, MITF, PDGFRA, POLD1, and POLE (sequencing only); EPCAM and GREM1 (deletion/duplication only).    03/18/2023 -  Chemotherapy   Patient is on Treatment Plan : BLADDER Pembrolizumab  (200) q21d     Malignant neoplasm of trigone of urinary bladder (HCC)  09/12/2020 Initial Diagnosis   Malignant neoplasm of trigone of urinary bladder (HCC)   09/26/2020 - 10/31/2020 Chemotherapy   Patient is on Treatment Plan : BLADDER Gemcitabine  q7d        CURRENT THERAPY: Maintenance Pembrolizumab  q3 (or q6) weeks for up to 2 years, starting 03/18/23  INTERVAL HISTORY Ms. Casale returns for follow up as scheduled. Continues pembro q3 weeks. Evidently last 2 cystoscopies were negative. Will see urology in July. Tolerating well except he feels lousy for a day or 2 after infusion. Eating/drinking well. Has been having vertigo which improved when BP meds were reduced. Also keeping track of low grade temps 99.2 - 100.2 in the evening going on for the past few months. Denies cough, diarrhea, dysuria/hematuria  or other signs of infection.   ROS  All other systems reviewed and negative  Past Medical History:  Diagnosis Date   Bladder cancer (HCC)    Blind right eye    BPH (benign prostatic hypertrophy)    Cataracts, both eyes    Depression    DM type 2 (diabetes  mellitus, type 2) (HCC)    DOE (dyspnea on exertion)    with heavy exertion   Full dentures    GERD (gastroesophageal reflux disease)    Glaucoma no peripheral or side vision   right eye blind   Glaucoma, right eye    HOH (hard of hearing)    Hyperlipidemia    Hypertension    Hypothyroidism    Local recurrence of cancer of urinary bladder (HCC) urologsit-  dr Joie Narrow   1st dx 01-29-2016--  now recurrent 05-31-2016   Lower extremity edema 07/11/2020   right swells more than left goes down at night after sleeping   Lower urinary tract symptoms (LUTS) 2019   Migraine    Peripheral neuropathy    middle finger left hand   Pneumonia 2019   Skin abnormality    base of spine looks like pimple has had since 07-01-2020, no drainage area intact   Wears glasses      Past Surgical History:  Procedure Laterality Date   ANTERIOR CERVICAL DECOMP/DISCECTOMY FUSION  08-15-2000   C5 -- C6   ANTERIOR CERVICAL DECOMP/DISCECTOMY FUSION  03-04-2008   C6 -- C7   areas removed from back at spine  2021 jan and aug 2021   CYSTOSCOPY W/ RETROGRADES Left 05/11/2020   Procedure: Cystoscopy with left retrograde ureteropyelogram, resection of probable bladder tumor and left trigonal/ureteral orifice region, 3 cm in size, fluoroscopic interpretation, left ureteroscopy, placement of 6 French by 26 cm contour double-J stent without tether;  Surgeon: Trent Frizzle, MD;  Location: [facility];  Service: Urology;  Laterality: Left;   CYSTOSCOPY W/ RETROGRADES Bilateral 06/24/2022   Procedure: CYSTOSCOPY WITH RETROGRADE PYELOGRAM;  Surgeon: Trent Frizzle, MD;  Location: [facility];  Service: Urology;  Laterality: Bilateral;  1 HR   CYSTOSCOPY WITH BIOPSY N/A 01/29/2016   Procedure: CYSTOSCOPY WITH BIOPSY;  Surgeon: Trent Frizzle, MD;  Location: [facility];  Service: Urology;  Laterality: N/A;   CYSTOSCOPY WITH BIOPSY N/A 05/31/2016   Procedure: CYSTOSCOPY WITH BIOPSY;   Surgeon: Trent Frizzle, MD;  [facility];  Service: Urology;  Laterality: N/A;   CYSTOSCOPY WITH BIOPSY N/A 06/24/2022   Procedure: CYSTOSCOPY WITH BIOPSY;  Surgeon: Trent Frizzle, MD;  Location: [facility];  Service: Urology;  Laterality: N/A;   EYE SURGERY Right 1998 & 2000   [place]   GLAUCOMA SURGERY  1998 and 2000   IR IMAGING GUIDED PORT INSERTION  03/31/2023   lower back  06/01/2019, 02-10-2020   nose cancer  areas removed  2019, 2021   x 2 / 3/ 2021 small area removed from nose   surgery for sacral fracture  40 yrs ago   TRANSURETHRAL RESECTION OF BLADDER TUMOR N/A 07/17/2020   Procedure: TRANSURETHRAL RESECTION OF BLADDER TUMOR (TURBT);  Surgeon: Trent Frizzle, MD;  Location[facility];  Service: Urology;  Laterality: N/A;     Outpatient Encounter Medications as of 01/27/2024  Medication Sig   acetaminophen  (TYLENOL ) 500 MG tablet Take 1,000 mg by mouth every 8 (eight) hours as needed for moderate pain.   amLODipine  (NORVASC ) 10 MG tablet Take 10 mg  by mouth daily.   amoxicillin -clavulanate (AUGMENTIN ) 875-125 MG tablet Take 1 tablet by mouth 2 (two) times daily.   atorvastatin  (LIPITOR) 20 MG tablet Take 20 mg by mouth at bedtime.  (Patient taking differently: Take 10 mg by mouth at bedtime.)   carteolol (OCUPRESS) 1 % ophthalmic solution Place 1 drop into the right eye every morning.   cetirizine (ZYRTEC) 10 MG tablet Take 10 mg by mouth daily as needed for allergies.   clotrimazole-betamethasone  (LOTRISONE) lotion Apply 1 Application topically daily as needed (irritation).   fluticasone  (FLONASE ) 50 MCG/ACT nasal spray Place 2 sprays into both nostrils daily as needed for allergies.    glimepiride  (AMARYL ) 1 MG tablet Take 1 mg by mouth daily with breakfast.   ibuprofen (ADVIL) 200 MG tablet Take 600 mg by mouth every 8 (eight) hours as needed for moderate pain.   latanoprost (XALATAN) 0.005 % ophthalmic solution Place 1 drop into  the right eye at bedtime.   levothyroxine  (SYNTHROID , LEVOTHROID) 100 MCG tablet Take 100 mcg by mouth daily before breakfast.    lidocaine -prilocaine  (EMLA ) cream Apply 1 Application topically as needed.   losartan  (COZAAR ) 100 MG tablet Take 100 mg by mouth at bedtime.    meloxicam (MOBIC) 15 MG tablet Take 15 mg by mouth daily.   Misc Natural Products (COMPLETE PROSTATE HEALTH PO) Take 1 tablet by mouth 2 (two) times daily.   omeprazole (PRILOSEC) 20 MG capsule Take 20 mg by mouth daily.   oxybutynin  (DITROPAN -XL) 5 MG 24 hr tablet TAKE 1 TABLET (5 MG TOTAL) BY MOUTH ONCE A WEEK. TAKE 2 HOURS BEFORE SCHEDULED CHEMO IN OUR OFFICE   oxycodone  (OXY-IR) 5 MG capsule Take 5 mg by mouth every 6 (six) hours as needed for pain.   propranolol  (INDERAL ) 20 MG tablet TAKE 1 TABLET BY MOUTH TWICE A DAY   sitaGLIPtin (JANUVIA) 100 MG tablet Take 100 mg by mouth every morning.   tamsulosin  (FLOMAX ) 0.4 MG CAPS capsule Take 0.4 mg by mouth every morning.    vitamin B-12 (CYANOCOBALAMIN ) 500 MCG tablet Take 500 mcg by mouth daily.   No facility-administered encounter medications on file as of 01/27/2024.     Today's Vitals   01/27/24 0910 01/27/24 0914  BP: (!) 118/56   Pulse: 71   Resp: 16   Temp: 98.5 F (36.9 C)   TempSrc: Temporal   SpO2: 99%   Weight: 183 lb 6.4 oz (83.2 kg)   Height: 5' 10 (1.778 m)   PainSc:  0-No pain   Body mass index is 26.32 kg/m.   ECOG PERFORMANCE STATUS: 1-2  PHYSICAL EXAM GENERAL:alert, no distress and comfortable SKIN: scattered ecchymosis over the forearms EYES: sclera clear NECK: without mass LUNGS: clear with normal breathing effort HEART: regular rate & rhythm, no lower extremity edema ABDOMEN: abdomen soft, non-tender and normal bowel sounds NEURO: alert & oriented x 3 with fluent speech, no focal motor/sensory deficits PAC without erythema    CBC    Latest Ref Rng & Units 01/27/2024    8:49 AM 01/06/2024    8:36 AM 12/16/2023    8:57 AM  CBC   WBC 4.0 - 10.5 K/uL 5.7  6.5  6.2   Hemoglobin 13.0 - 17.0 g/dL 16.1  09.6  04.5   Hematocrit 39.0 - 52.0 % 35.9  36.3  36.4   Platelets 150 - 400 K/uL 169  165  185       CMP     Latest Ref Rng &  Units 01/27/2024    8:49 AM 01/06/2024    8:36 AM 12/16/2023    8:57 AM  CMP  Glucose 70 - 99 mg/dL 161  096  045   BUN 8 - 23 mg/dL 13  16  16    Creatinine 0.61 - 1.24 mg/dL 4.09  8.11  9.14   Sodium 135 - 145 mmol/L 139  138  139   Potassium 3.5 - 5.1 mmol/L 4.2  3.8  3.8   Chloride 98 - 111 mmol/L 106  105  107   CO2 22 - 32 mmol/L 27  27  28    Calcium  8.9 - 10.3 mg/dL 9.0  9.0  9.0   Total Protein 6.5 - 8.1 g/dL 6.9  6.8  6.9   Total Bilirubin 0.0 - 1.2 mg/dL 0.4  0.4  0.4   Alkaline Phos 38 - 126 U/L 47  52  53   AST 15 - 41 U/L 16  16  15    ALT 0 - 44 U/L 16  14  14        ASSESSMENT & PLAN: 83 yo male   Bladder cancer  -initially diagnosed in 2017  -He was found to have high-grade urothelial carcinoma without muscle invasion, 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  -S/p Intravesicular gemcitabine  weekly X6 started in February 2022  -bladder biopsies, bladder washings on 06/24/2022 showed focal urothelial carcinoma in situ (CIS) and cytology was positive for high grade urothelial carcinoma -S/p intravesical gemcitabine  weekly x 6, he started on 08/28/16.   -Due to his poor tolerance to intravesical gemcitabine , changed to intravesical mitomycin  40 mg weekly x 5 weeks. He tolerated the treatment very well  -His surveillance cystoscopy and biopsy were negative for residual cancer -Given his good response to intravesical mitomycin , and his high risk of recurrence, Dr. Joie Narrow recommend intravesical therapy, started in in April 2024. Unfortunately he has had recurrent leakage from the intravesical therapy, and not able to continue. -Treatment was switched to Keytruda  on 03/18/23, he has been tolerating well overall.  Plan to continue every 3 weeks for up to 2  years if no recurrence. Per pt last 2 cystoscopies were negative. Will see urology Dr. Secundino Dach 03/02/24 -Mr. Slatter appears stable. Tolerating pembro with fatigue. The etiology of the low grade fevers is not known, denies obvious signs of infection. Will initiate ID work up if temp curve rises or he has obvious clinical signs.  -Labs reviewed, WBC normal which is reassuring, otherwise stable. TSH/T4 pending -Proceed with Pembrolizumab  today as scheduled, continue q3 weeks -F/up in 6 weeks as scheduled    PLAN: -Labs reviewed, TSH/T4 pending -Monitor fevers, call for s/sx of infection or >100.4 -Proceed with Pembrolizumab  today, continues q3 weeks -F/up in 6 weeks, or sooner if needed -F/up Dr. Secundino Dach 03/02/24    All questions were answered. The patient knows to call the clinic with any problems, questions or concerns. No barriers to learning were detected. I spent 20 minutes counseling the patient face to face. The total time spent in the appointment was 30 minutes and more than 50% was on counseling, review of test results, and coordination of care.   Journey Castonguay K Terrisha Lopata, NP 01/27/2024

## 2024-01-27 ENCOUNTER — Encounter: Payer: Self-pay | Admitting: Nurse Practitioner

## 2024-01-27 ENCOUNTER — Inpatient Hospital Stay (HOSPITAL_BASED_OUTPATIENT_CLINIC_OR_DEPARTMENT_OTHER): Admitting: Nurse Practitioner

## 2024-01-27 ENCOUNTER — Inpatient Hospital Stay

## 2024-01-27 ENCOUNTER — Inpatient Hospital Stay: Attending: Hematology

## 2024-01-27 VITALS — BP 118/56 | HR 71 | Temp 98.5°F | Resp 16 | Ht 70.0 in | Wt 183.4 lb

## 2024-01-27 DIAGNOSIS — Z79899 Other long term (current) drug therapy: Secondary | ICD-10-CM | POA: Diagnosis not present

## 2024-01-27 DIAGNOSIS — C671 Malignant neoplasm of dome of bladder: Secondary | ICD-10-CM

## 2024-01-27 DIAGNOSIS — Z79620 Long term (current) use of immunosuppressive biologic: Secondary | ICD-10-CM | POA: Insufficient documentation

## 2024-01-27 DIAGNOSIS — C670 Malignant neoplasm of trigone of bladder: Secondary | ICD-10-CM | POA: Insufficient documentation

## 2024-01-27 DIAGNOSIS — Z5112 Encounter for antineoplastic immunotherapy: Secondary | ICD-10-CM | POA: Insufficient documentation

## 2024-01-27 DIAGNOSIS — R5383 Other fatigue: Secondary | ICD-10-CM

## 2024-01-27 LAB — CBC WITH DIFFERENTIAL (CANCER CENTER ONLY)
Abs Immature Granulocytes: 0.01 10*3/uL (ref 0.00–0.07)
Basophils Absolute: 0 10*3/uL (ref 0.0–0.1)
Basophils Relative: 1 %
Eosinophils Absolute: 0.4 10*3/uL (ref 0.0–0.5)
Eosinophils Relative: 6 %
HCT: 35.9 % — ABNORMAL LOW (ref 39.0–52.0)
Hemoglobin: 12.2 g/dL — ABNORMAL LOW (ref 13.0–17.0)
Immature Granulocytes: 0 %
Lymphocytes Relative: 21 %
Lymphs Abs: 1.2 10*3/uL (ref 0.7–4.0)
MCH: 30.3 pg (ref 26.0–34.0)
MCHC: 34 g/dL (ref 30.0–36.0)
MCV: 89.1 fL (ref 80.0–100.0)
Monocytes Absolute: 0.5 10*3/uL (ref 0.1–1.0)
Monocytes Relative: 9 %
Neutro Abs: 3.6 10*3/uL (ref 1.7–7.7)
Neutrophils Relative %: 63 %
Platelet Count: 169 10*3/uL (ref 150–400)
RBC: 4.03 MIL/uL — ABNORMAL LOW (ref 4.22–5.81)
RDW: 12.2 % (ref 11.5–15.5)
WBC Count: 5.7 10*3/uL (ref 4.0–10.5)
nRBC: 0 % (ref 0.0–0.2)

## 2024-01-27 LAB — CMP (CANCER CENTER ONLY)
ALT: 16 U/L (ref 0–44)
AST: 16 U/L (ref 15–41)
Albumin: 4.2 g/dL (ref 3.5–5.0)
Alkaline Phosphatase: 47 U/L (ref 38–126)
Anion gap: 6 (ref 5–15)
BUN: 13 mg/dL (ref 8–23)
CO2: 27 mmol/L (ref 22–32)
Calcium: 9 mg/dL (ref 8.9–10.3)
Chloride: 106 mmol/L (ref 98–111)
Creatinine: 0.85 mg/dL (ref 0.61–1.24)
GFR, Estimated: >60 mL/min (ref >60)
Glucose, Bld: 214 mg/dL — ABNORMAL HIGH (ref 70–99)
Potassium: 4.2 mmol/L (ref 3.5–5.1)
Sodium: 139 mmol/L (ref 135–145)
Total Bilirubin: 0.4 mg/dL (ref 0.0–1.2)
Total Protein: 6.9 g/dL (ref 6.5–8.1)

## 2024-01-27 LAB — T4, FREE: Free T4: 1.05 ng/dL (ref 0.61–1.12)

## 2024-01-27 LAB — TSH: TSH: 1.14 u[IU]/mL (ref 0.350–4.500)

## 2024-01-27 MED ORDER — SODIUM CHLORIDE 0.9 % IV SOLN
200.0000 mg | Freq: Once | INTRAVENOUS | Status: AC
  Administered 2024-01-27: 200 mg via INTRAVENOUS
  Filled 2024-01-27 (×2): qty 8

## 2024-01-27 MED ORDER — SODIUM CHLORIDE 0.9 % IV SOLN: Freq: Once | INTRAVENOUS | Status: AC

## 2024-01-27 NOTE — Patient Instructions (Signed)
 [facility]  Discharge Instructions: Thank you for choosing [facility] to provide your oncology and hematology care.   If you have a lab appointment with the Cancer Center, please go directly to the Cancer Center and check in at the registration area.   Wear comfortable clothing and clothing appropriate for easy access to any Portacath or PICC line.   We strive to give you quality time with your provider. You may need to reschedule your appointment if you arrive late (15 or more minutes).  Arriving late affects you and other patients whose appointments are after yours.  Also, if you miss three or more appointments without notifying the office, you may be dismissed from the clinic at the provider's discretion.      For prescription refill requests, have your pharmacy contact our office and allow 72 hours for refills to be completed.    Today you received the following chemotherapy and/or immunotherapy agents: pembrolizumab      To help prevent nausea and vomiting after your treatment, we encourage you to take your nausea medication as directed.  BELOW ARE SYMPTOMS THAT SHOULD BE REPORTED IMMEDIATELY: *FEVER GREATER THAN 100.4 F (38 C) OR HIGHER *CHILLS OR SWEATING *NAUSEA AND VOMITING THAT IS NOT CONTROLLED WITH YOUR NAUSEA MEDICATION *UNUSUAL SHORTNESS OF BREATH *UNUSUAL BRUISING OR BLEEDING *URINARY PROBLEMS (pain or burning when urinating, or frequent urination) *BOWEL PROBLEMS (unusual diarrhea, constipation, pain near the anus) TENDERNESS IN MOUTH AND THROAT WITH OR WITHOUT PRESENCE OF ULCERS (sore throat, sores in mouth, or a toothache) UNUSUAL RASH, SWELLING OR PAIN  UNUSUAL VAGINAL DISCHARGE OR ITCHING   Items with * indicate a potential emergency and should be followed up as soon as possible or go to the Emergency Department if any problems should occur.  Please show the CHEMOTHERAPY ALERT CARD or  IMMUNOTHERAPY ALERT CARD at check-in to the Emergency Department and triage nurse.  Should you have questions after your visit or need to cancel or reschedule your appointment, please contact [facility]  Dept: [phone]  and follow the prompts.  Office hours are 8:00 a.m. to 4:30 p.m. Monday - Friday. Please note that voicemails left after 4:00 p.m. may not be returned until the following business day.  We are closed weekends and major holidays. You have access to a nurse at all times for urgent questions. Please call the main number to the clinic Dept: [phone] and follow the prompts.   For any non-urgent questions, you may also contact your provider using MyChart. We now offer e-Visits for anyone 78 and older to request care online for non-urgent symptoms. For details visit [email or web address].   Also download the MyChart app! Go to the app store, search "MyChart", open the app, select [place], and log in with your MyChart username and password.

## 2024-01-27 NOTE — Patient Instructions (Signed)

## 2024-01-29 ENCOUNTER — Ambulatory Visit (INDEPENDENT_AMBULATORY_CARE_PROVIDER_SITE_OTHER): Admitting: Podiatry

## 2024-01-29 DIAGNOSIS — M79675 Pain in left toe(s): Secondary | ICD-10-CM | POA: Diagnosis not present

## 2024-01-29 DIAGNOSIS — B351 Tinea unguium: Secondary | ICD-10-CM | POA: Diagnosis not present

## 2024-01-29 DIAGNOSIS — M79674 Pain in right toe(s): Secondary | ICD-10-CM | POA: Diagnosis not present

## 2024-01-29 DIAGNOSIS — E1149 Type 2 diabetes mellitus with other diabetic neurological complication: Secondary | ICD-10-CM | POA: Diagnosis not present

## 2024-01-30 ENCOUNTER — Other Ambulatory Visit: Payer: Self-pay

## 2024-02-01 NOTE — Progress Notes (Signed)
 Subjective:   Patient ID: Keith [name], male   DOB: 83 y.o.   MRN: [id number]   HPI Chief Complaint  Patient presents with   [facility]    RM#DFC patient has no concerns at this time other then states his diabetic shoes causing him discomfort.   Anorexia     83 year old male presents the office with above concerns.  He states his nails are thick and elongated he cannot trim them himself and are causing discomfort.  He does not report any ulcerations.  No new concerns.   Valentin Skates, DO      Objective:  Physical Exam  General: AAO x3, NAD  Dermatological: Nails are hypertrophic, dystrophic, brittle, discolored, elongated 10.  Yellow discoloration of the toenails.  No surrounding redness or drainage. Tenderness nails 1-5 bilaterally. No open lesions or pre-ulcerative lesions are identified today.  Vascular: Dorsalis Pedis artery and Posterior Tibial artery pedal pulses are 2/4 bilateral with immedate capillary fill time. There is no pain with calf compression, swelling, warmth, erythema.   Neruologic: Sensation decreased with Semmes Weinstein monofilament to the plantar forefoot.  Musculoskeletal: Hammertoes present.  Assessment:   83 year old male with symptomatic onychomycosis, type 2 diabetes with neuropathy     Plan:  -Treatment options discussed including all alternatives, risks, and complications -Etiology of symptoms were discussed -Nails debrided 10 without complications or bleeding.  Continue routine debridement.  -Discussed neuropathy.  Previously on gabapentin  but states he had to come off of this due to other issues, liver issues.  Discussed topical creams. -Daily foot inspection  Return in about 3 months (around 04/30/2024).  Donnice Fees, DPM

## 2024-02-11 DIAGNOSIS — M79604 Pain in right leg: Secondary | ICD-10-CM | POA: Diagnosis not present

## 2024-02-11 DIAGNOSIS — R262 Difficulty in walking, not elsewhere classified: Secondary | ICD-10-CM | POA: Diagnosis not present

## 2024-02-11 DIAGNOSIS — R531 Weakness: Secondary | ICD-10-CM | POA: Diagnosis not present

## 2024-02-11 DIAGNOSIS — M79605 Pain in left leg: Secondary | ICD-10-CM | POA: Diagnosis not present

## 2024-02-12 DIAGNOSIS — H903 Sensorineural hearing loss, bilateral: Secondary | ICD-10-CM | POA: Diagnosis not present

## 2024-02-16 DIAGNOSIS — M79604 Pain in right leg: Secondary | ICD-10-CM | POA: Diagnosis not present

## 2024-02-16 DIAGNOSIS — R262 Difficulty in walking, not elsewhere classified: Secondary | ICD-10-CM | POA: Diagnosis not present

## 2024-02-16 DIAGNOSIS — R531 Weakness: Secondary | ICD-10-CM | POA: Diagnosis not present

## 2024-02-16 DIAGNOSIS — M79605 Pain in left leg: Secondary | ICD-10-CM | POA: Diagnosis not present

## 2024-02-17 ENCOUNTER — Inpatient Hospital Stay

## 2024-02-17 ENCOUNTER — Inpatient Hospital Stay: Attending: Hematology

## 2024-02-17 VITALS — BP 130/55 | HR 65 | Temp 97.9°F | Resp 20 | Wt 183.2 lb

## 2024-02-17 DIAGNOSIS — Z79899 Other long term (current) drug therapy: Secondary | ICD-10-CM | POA: Diagnosis not present

## 2024-02-17 DIAGNOSIS — Z5112 Encounter for antineoplastic immunotherapy: Secondary | ICD-10-CM | POA: Insufficient documentation

## 2024-02-17 DIAGNOSIS — C671 Malignant neoplasm of dome of bladder: Secondary | ICD-10-CM

## 2024-02-17 DIAGNOSIS — C670 Malignant neoplasm of trigone of bladder: Secondary | ICD-10-CM | POA: Insufficient documentation

## 2024-02-17 DIAGNOSIS — Z79620 Long term (current) use of immunosuppressive biologic: Secondary | ICD-10-CM | POA: Insufficient documentation

## 2024-02-17 LAB — CMP (CANCER CENTER ONLY)
ALT: 16 U/L (ref 0–44)
AST: 17 U/L (ref 15–41)
Albumin: 4.1 g/dL (ref 3.5–5.0)
Alkaline Phosphatase: 52 U/L (ref 38–126)
Anion gap: 6 (ref 5–15)
BUN: 11 mg/dL (ref 8–23)
CO2: 28 mmol/L (ref 22–32)
Calcium: 9.2 mg/dL (ref 8.9–10.3)
Chloride: 105 mmol/L (ref 98–111)
Creatinine: 0.92 mg/dL (ref 0.61–1.24)
GFR, Estimated: >60 mL/min (ref >60)
Glucose, Bld: 220 mg/dL — ABNORMAL HIGH (ref 70–99)
Potassium: 3.9 mmol/L (ref 3.5–5.1)
Sodium: 139 mmol/L (ref 135–145)
Total Bilirubin: 0.4 mg/dL (ref 0.0–1.2)
Total Protein: 6.8 g/dL (ref 6.5–8.1)

## 2024-02-17 LAB — CBC WITH DIFFERENTIAL (CANCER CENTER ONLY)
Abs Immature Granulocytes: 0.02 K/uL (ref 0.00–0.07)
Basophils Absolute: 0.1 K/uL (ref 0.0–0.1)
Basophils Relative: 1 %
Eosinophils Absolute: 0.4 K/uL (ref 0.0–0.5)
Eosinophils Relative: 8 %
HCT: 36.6 % — ABNORMAL LOW (ref 39.0–52.0)
Hemoglobin: 12.7 g/dL — ABNORMAL LOW (ref 13.0–17.0)
Immature Granulocytes: 0 %
Lymphocytes Relative: 25 %
Lymphs Abs: 1.3 K/uL (ref 0.7–4.0)
MCH: 30.6 pg (ref 26.0–34.0)
MCHC: 34.7 g/dL (ref 30.0–36.0)
MCV: 88.2 fL (ref 80.0–100.0)
Monocytes Absolute: 0.4 K/uL (ref 0.1–1.0)
Monocytes Relative: 8 %
Neutro Abs: 3.1 K/uL (ref 1.7–7.7)
Neutrophils Relative %: 58 %
Platelet Count: 157 K/uL (ref 150–400)
RBC: 4.15 MIL/uL — ABNORMAL LOW (ref 4.22–5.81)
RDW: 12.4 % (ref 11.5–15.5)
WBC Count: 5.3 K/uL (ref 4.0–10.5)
nRBC: 0 % (ref 0.0–0.2)

## 2024-02-17 MED ORDER — SODIUM CHLORIDE 0.9 % IV SOLN: Freq: Once | INTRAVENOUS | Status: AC

## 2024-02-17 MED ORDER — SODIUM CHLORIDE 0.9 % IV SOLN
200.0000 mg | Freq: Once | INTRAVENOUS | Status: AC
  Administered 2024-02-17: 200 mg via INTRAVENOUS
  Filled 2024-02-17: qty 200

## 2024-02-17 MED ORDER — HEPARIN SOD (PORK) LOCK FLUSH 100 UNIT/ML IV SOLN
500.0000 [IU] | Freq: Once | INTRAVENOUS | Status: AC | PRN
Start: 2024-02-17 — End: 2024-02-17
  Administered 2024-02-17: 500 [IU]

## 2024-02-17 MED ORDER — SODIUM CHLORIDE 0.9% FLUSH
10.0000 mL | INTRAVENOUS | Status: DC | PRN
Start: 2024-02-17 — End: 2024-02-17
  Administered 2024-02-17: 10 mL

## 2024-02-17 NOTE — Patient Instructions (Signed)

## 2024-02-19 DIAGNOSIS — M79605 Pain in left leg: Secondary | ICD-10-CM | POA: Diagnosis not present

## 2024-02-19 DIAGNOSIS — M79604 Pain in right leg: Secondary | ICD-10-CM | POA: Diagnosis not present

## 2024-02-19 DIAGNOSIS — R531 Weakness: Secondary | ICD-10-CM | POA: Diagnosis not present

## 2024-02-19 DIAGNOSIS — R262 Difficulty in walking, not elsewhere classified: Secondary | ICD-10-CM | POA: Diagnosis not present

## 2024-02-23 DIAGNOSIS — M79604 Pain in right leg: Secondary | ICD-10-CM | POA: Diagnosis not present

## 2024-02-23 DIAGNOSIS — M79605 Pain in left leg: Secondary | ICD-10-CM | POA: Diagnosis not present

## 2024-02-23 DIAGNOSIS — R262 Difficulty in walking, not elsewhere classified: Secondary | ICD-10-CM | POA: Diagnosis not present

## 2024-02-23 DIAGNOSIS — R531 Weakness: Secondary | ICD-10-CM | POA: Diagnosis not present

## 2024-02-25 DIAGNOSIS — R262 Difficulty in walking, not elsewhere classified: Secondary | ICD-10-CM | POA: Diagnosis not present

## 2024-02-25 DIAGNOSIS — M79605 Pain in left leg: Secondary | ICD-10-CM | POA: Diagnosis not present

## 2024-02-25 DIAGNOSIS — R531 Weakness: Secondary | ICD-10-CM | POA: Diagnosis not present

## 2024-02-25 DIAGNOSIS — M79604 Pain in right leg: Secondary | ICD-10-CM | POA: Diagnosis not present

## 2024-03-02 DIAGNOSIS — R3915 Urgency of urination: Secondary | ICD-10-CM | POA: Diagnosis not present

## 2024-03-02 DIAGNOSIS — C678 Malignant neoplasm of overlapping sites of bladder: Secondary | ICD-10-CM | POA: Diagnosis not present

## 2024-03-03 ENCOUNTER — Encounter: Payer: Self-pay | Admitting: Hematology

## 2024-03-03 DIAGNOSIS — M79605 Pain in left leg: Secondary | ICD-10-CM | POA: Diagnosis not present

## 2024-03-03 DIAGNOSIS — M79604 Pain in right leg: Secondary | ICD-10-CM | POA: Diagnosis not present

## 2024-03-03 DIAGNOSIS — R262 Difficulty in walking, not elsewhere classified: Secondary | ICD-10-CM | POA: Diagnosis not present

## 2024-03-03 DIAGNOSIS — R531 Weakness: Secondary | ICD-10-CM | POA: Diagnosis not present

## 2024-03-05 DIAGNOSIS — M79604 Pain in right leg: Secondary | ICD-10-CM | POA: Diagnosis not present

## 2024-03-05 DIAGNOSIS — M79605 Pain in left leg: Secondary | ICD-10-CM | POA: Diagnosis not present

## 2024-03-05 DIAGNOSIS — R531 Weakness: Secondary | ICD-10-CM | POA: Diagnosis not present

## 2024-03-05 DIAGNOSIS — R262 Difficulty in walking, not elsewhere classified: Secondary | ICD-10-CM | POA: Diagnosis not present

## 2024-03-09 ENCOUNTER — Inpatient Hospital Stay (HOSPITAL_BASED_OUTPATIENT_CLINIC_OR_DEPARTMENT_OTHER): Admitting: Hematology

## 2024-03-09 ENCOUNTER — Encounter: Payer: Self-pay | Admitting: Hematology

## 2024-03-09 ENCOUNTER — Other Ambulatory Visit: Payer: Self-pay

## 2024-03-09 ENCOUNTER — Inpatient Hospital Stay

## 2024-03-09 ENCOUNTER — Telehealth: Payer: Self-pay | Admitting: Hematology

## 2024-03-09 VITALS — BP 129/67 | HR 59 | Temp 98.4°F | Resp 17 | Wt 186.4 lb

## 2024-03-09 DIAGNOSIS — C671 Malignant neoplasm of dome of bladder: Secondary | ICD-10-CM

## 2024-03-09 DIAGNOSIS — Z95828 Presence of other vascular implants and grafts: Secondary | ICD-10-CM

## 2024-03-09 DIAGNOSIS — C679 Malignant neoplasm of bladder, unspecified: Secondary | ICD-10-CM

## 2024-03-09 DIAGNOSIS — Z5112 Encounter for antineoplastic immunotherapy: Secondary | ICD-10-CM | POA: Diagnosis not present

## 2024-03-09 LAB — CMP (CANCER CENTER ONLY)
ALT: 13 U/L (ref 0–44)
AST: 16 U/L (ref 15–41)
Albumin: 4 g/dL (ref 3.5–5.0)
Alkaline Phosphatase: 46 U/L (ref 38–126)
Anion gap: 5 (ref 5–15)
BUN: 14 mg/dL (ref 8–23)
CO2: 27 mmol/L (ref 22–32)
Calcium: 9 mg/dL (ref 8.9–10.3)
Chloride: 107 mmol/L (ref 98–111)
Creatinine: 0.96 mg/dL (ref 0.61–1.24)
GFR, Estimated: >60 mL/min (ref >60)
Glucose, Bld: 201 mg/dL — ABNORMAL HIGH (ref 70–99)
Potassium: 3.9 mmol/L (ref 3.5–5.1)
Sodium: 139 mmol/L (ref 135–145)
Total Bilirubin: 0.5 mg/dL (ref 0.0–1.2)
Total Protein: 7 g/dL (ref 6.5–8.1)

## 2024-03-09 LAB — CBC WITH DIFFERENTIAL (CANCER CENTER ONLY)
Abs Immature Granulocytes: 0.02 K/uL (ref 0.00–0.07)
Basophils Absolute: 0.1 K/uL (ref 0.0–0.1)
Basophils Relative: 1 %
Eosinophils Absolute: 0.4 K/uL (ref 0.0–0.5)
Eosinophils Relative: 7 %
HCT: 36 % — ABNORMAL LOW (ref 39.0–52.0)
Hemoglobin: 12.4 g/dL — ABNORMAL LOW (ref 13.0–17.0)
Immature Granulocytes: 0 %
Lymphocytes Relative: 24 %
Lymphs Abs: 1.3 K/uL (ref 0.7–4.0)
MCH: 30.4 pg (ref 26.0–34.0)
MCHC: 34.4 g/dL (ref 30.0–36.0)
MCV: 88.2 fL (ref 80.0–100.0)
Monocytes Absolute: 0.5 K/uL (ref 0.1–1.0)
Monocytes Relative: 10 %
Neutro Abs: 3.1 K/uL (ref 1.7–7.7)
Neutrophils Relative %: 58 %
Platelet Count: 189 K/uL (ref 150–400)
RBC: 4.08 MIL/uL — ABNORMAL LOW (ref 4.22–5.81)
RDW: 12.7 % (ref 11.5–15.5)
WBC Count: 5.3 K/uL (ref 4.0–10.5)
nRBC: 0 % (ref 0.0–0.2)

## 2024-03-09 LAB — TSH: TSH: 1.5 u[IU]/mL (ref 0.350–4.500)

## 2024-03-09 MED ORDER — SODIUM CHLORIDE 0.9% FLUSH
10.0000 mL | INTRAVENOUS | Status: DC | PRN
  Administered 2024-03-09: 10 mL

## 2024-03-09 MED ORDER — HEPARIN SOD (PORK) LOCK FLUSH 100 UNIT/ML IV SOLN
500.0000 [IU] | Freq: Once | INTRAVENOUS | Status: AC | PRN
  Administered 2024-03-09: 500 [IU]

## 2024-03-09 MED ORDER — SODIUM CHLORIDE 0.9% FLUSH
10.0000 mL | INTRAVENOUS | Status: DC | PRN
Start: 2024-03-09 — End: 2024-03-09
  Administered 2024-03-09: 10 mL

## 2024-03-09 MED ORDER — SODIUM CHLORIDE 0.9 % IV SOLN
200.0000 mg | Freq: Once | INTRAVENOUS | Status: AC
  Administered 2024-03-09: 200 mg via INTRAVENOUS
  Filled 2024-03-09: qty 8

## 2024-03-09 MED ORDER — SODIUM CHLORIDE 0.9 % IV SOLN: Freq: Once | INTRAVENOUS | Status: AC

## 2024-03-09 NOTE — Telephone Encounter (Signed)
 Scheduled appointments per WQ. Talked with the patient and he is aware of the made appointments.

## 2024-03-09 NOTE — Patient Instructions (Signed)
 [facility] - A DEPT OF [facility]  Discharge Instructions: Thank you for choosing [facility] to provide your oncology and hematology care.   If you have a lab appointment with the Cancer Center, please go directly to the Cancer Center and check in at the registration area.   Wear comfortable clothing and clothing appropriate for easy access to any Portacath or PICC line.   We strive to give you quality time with your provider. You may need to reschedule your appointment if you arrive late (15 or more minutes).  Arriving late affects you and other patients whose appointments are after yours.  Also, if you miss three or more appointments without notifying the office, you may be dismissed from the clinic at the provider's discretion.      For prescription refill requests, have your pharmacy contact our office and allow 72 hours for refills to be completed.    Today you received the following chemotherapy and/or immunotherapy agents Rande Lawman      To help prevent nausea and vomiting after your treatment, we encourage you to take your nausea medication as directed.  BELOW ARE SYMPTOMS THAT SHOULD BE REPORTED IMMEDIATELY: *FEVER GREATER THAN 100.4 F (38 C) OR HIGHER *CHILLS OR SWEATING *NAUSEA AND VOMITING THAT IS NOT CONTROLLED WITH YOUR NAUSEA MEDICATION *UNUSUAL SHORTNESS OF BREATH *UNUSUAL BRUISING OR BLEEDING *URINARY PROBLEMS (pain or burning when urinating, or frequent urination) *BOWEL PROBLEMS (unusual diarrhea, constipation, pain near the anus) TENDERNESS IN MOUTH AND THROAT WITH OR WITHOUT PRESENCE OF ULCERS (sore throat, sores in mouth, or a toothache) UNUSUAL RASH, SWELLING OR PAIN  UNUSUAL VAGINAL DISCHARGE OR ITCHING   Items with * indicate a potential emergency and should be followed up as soon as possible or go to the Emergency Department if any problems should occur.  Please show the CHEMOTHERAPY ALERT CARD or IMMUNOTHERAPY  ALERT CARD at check-in to the Emergency Department and triage nurse.  Should you have questions after your visit or need to cancel or reschedule your appointment, please contact [facility]  Dept: [phone]  and follow the prompts.  Office hours are 8:00 a.m. to 4:30 p.m. Monday - Friday. Please note that voicemails left after 4:00 p.m. may not be returned until the following business day.  We are closed weekends and major holidays. You have access to a nurse at all times for urgent questions. Please call the main number to the clinic Dept: [phone] and follow the prompts.   For any non-urgent questions, you may also contact your provider using MyChart. We now offer e-Visits for anyone 85 and older to request care online for non-urgent symptoms. For details visit [email or web address].   Also download the MyChart app! Go to the app store, search "MyChart", open the app, select , and log in with your MyChart username and password.

## 2024-03-09 NOTE — Assessment & Plan Note (Signed)
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 He received Intravesicular gemcitabine with a total of 2000 mg weekly X6 started in February 2022  -bladder biopsies, bladder washings on 06/24/2022 showed focal urothelial carcinoma in situ (CIS) and cytology was positive for high grade urothelial carcinoma -I recommended intravesical gemcitabine 2000 mg weekly x 6, he started on 08/28/16.  He tolerated first cycle poorly with significant hematuria, and intermittent fever for week after treatment. -Due to his poor tolerance to intravesical gemcitabine, I changed therapy to intravesical mitomycin 40 mg every week for 5 weeks. He tolerated the treatment very well  -His surveillance cystoscopy and biopsy were negative for residual cancer -Given his good response to intravesical mitomycin, and his high risk of recurrence, Dr. Retta Diones recommend small intravesical therapy.  Patient agrees to proceed, started in in April 2024. Unfortunately he has had recurrent leakage from the intravesical therapy, and not able to continue. -Treatment was switched to [facility] on March 18, 2023, he has been tolerating well overall.  Plan to continue every 3 weeks for up to 2 years if no recurrence.

## 2024-03-09 NOTE — Progress Notes (Signed)
 [facility] - [facility]   Telephone:([phone]) [phone] Fax:([phone]) [phone]   Clinic Follow up Note   Patient Care Team: Valentin Skates, DO as PCP - General (Internal Medicine) Lanny Callander, MD as Attending Physician (Hematology and Oncology) Matilda Senior, MD as Consulting Physician (Urology)  Date of Service:  03/09/2024  CHIEF COMPLAINT: f/u of bladder cancer   CURRENT THERAPY:  Keytruda  every 3 weeks  Oncology History   Bladder cancer [facility]) -initially diagnosed in 2017  -He was found to have high-grade urothelial carcinoma without muscle invasion and developed and subsequently BCG refractory disease in December 2021. He received Intravesicular gemcitabine  with a total of 2000 mg weekly X6 started in February 2022  -bladder biopsies, bladder washings on 06/24/2022 showed focal urothelial carcinoma in situ (CIS) and cytology was positive for high grade urothelial carcinoma -I recommended intravesical gemcitabine  2000 mg weekly x 6, he started on 08/28/16.  He tolerated first cycle poorly with significant hematuria, and intermittent fever for week after treatment. -Due to his poor tolerance to intravesical gemcitabine , I changed therapy to intravesical mitomycin  40 mg every week for 5 weeks. He tolerated the treatment very well  -His surveillance cystoscopy and biopsy were negative for residual cancer -Given his good response to intravesical mitomycin , and his high risk of recurrence, Dr. Matilda recommend small intravesical therapy.  Patient agrees to proceed, started in in April 2024. Unfortunately he has had recurrent leakage from the intravesical therapy, and not able to continue. -Treatment was switched to Keytruda  on March 18, 2023, he has been tolerating well overall.  Plan to continue every 3 weeks for up to 2 years if no recurrence.  Assessment & Plan Bladder cancer on immunotherapy (Keytruda ) Bladder cancer is being managed with Keytruda  for one year. Recent cystoscopy was  clear with no signs of cancer last week. - Continue Keytruda  for one more year - Plan cystoscopy follow-up in October  Intermittent low-grade fever, etiology unclear Experiencing intermittent low-grade fever, primarily in the afternoons, with temperatures ranging from 99.4 to 101F. No signs of infection or other concerning symptoms. Fever persists even without exposure to heat. Differential includes possible side effects from immunotherapy. - Monitor temperature patterns - Consider Tylenol  for fever above 100F - If symptoms worsen, consider further workup including scans and blood work  Low energy, poor appetite, and generalized weakness Reports low energy and poor appetite, primarily consuming snacks like crackers and coffee. Generalized weakness noted, particularly in the legs, causing concern for potential falls. Attending rehab for exercise to improve strength and endurance. - Encourage high-calorie drinks like Ensure or almond milk - Continue rehab exercises to improve strength and endurance - Monitor energy levels and appetite  Risk of falls Increased risk of falls due to generalized weakness and leg fatigue. Does not use a cane but is encouraged to do so to prevent falls. - Encourage use of a cane for stability - Continue exercises to strengthen legs and improve balance  Recurrent skin abscesses, currently resolved Recurrent skin abscesses, with the most recent one on the back having resolved after drainage.  Plan - Lab reviewed, adequate for treatment, will proceed with Keytruda  today and continue every 3 weeks - His wife will call me if he is fever gets worse, then I will order cultures and CT scan - Follow-up in 3 weeks before next cycle Keytruda    SUMMARY OF ONCOLOGIC HISTORY: Oncology History Overview Note   Cancer Staging  Bladder cancer [facility]) Staging form: Urinary Bladder, AJCC 8th Edition - Clinical: Stage  I (cT1, cN0, cM0) - Signed by Amadeo Windell SAILOR, MD on  09/12/2020 WHO/ISUP grade (low/high): High Grade Histologic grading system: 2 grade system - Pathologic stage from 06/24/2022: Stage 0is (pTis, pN0, cM0) - Signed by Lanny Callander, MD on 08/24/2022 Stage prefix: Initial diagnosis WHO/ISUP grade (low/high): High Grade Histologic grading system: 2 grade system     Bladder cancer (HCC)  09/12/2020 Initial Diagnosis   Bladder cancer (HCC)   09/12/2020 Cancer Staging   Staging form: Urinary Bladder, AJCC 8th Edition - Clinical: Stage I (cT1, cN0, cM0) - Signed by Amadeo Windell SAILOR, MD on 09/12/2020 WHO/ISUP grade (low/high): High Grade Histologic grading system: 2 grade system   06/24/2022 Pathology Results             FINAL MICROSCOPIC DIAGNOSIS:  A. BLADDER, BIOPSY: Focal urothelial carcinoma in situ (CIS)     06/24/2022 Cancer Staging   Staging form: Urinary Bladder, AJCC 8th Edition - Pathologic stage from 06/24/2022: Stage 0is (pTis, pN0, cM0) - Signed by Lanny Callander, MD on 08/24/2022 Stage prefix: Initial diagnosis WHO/ISUP grade (low/high): High Grade Histologic grading system: 2 grade system   08/28/2022 - 08/28/2022 Chemotherapy   Patient is on Treatment Plan : BLADDER Gemcitabine  INTRAVESICAL (2000) q7d     09/12/2022 - 02/26/2023 Chemotherapy   Patient is on Treatment Plan : BLADDER Mitomycin  INTRAVESICAL (40 mg) q7d     10/25/2022 Genetic Testing   Negative [facility] CancerNext-Expanded +RNA Panel.  Report date is 10/25/2022.   The CancerNext-Expanded gene panel offered by [facility] and includes sequencing, rearrangement, and RNA analysis for the following 77 genes: AIP, ALK, APC, ATM, AXIN2, BAP1, BARD1, BLM, BMPR1A, BRCA1, BRCA2, BRIP1, CDC73, CDH1, CDK4, CDKN1B, CDKN2A, CHEK2, CTNNA1, DICER1, FANCC, FH, FLCN, GALNT12, KIF1B, LZTR1, MAX, MEN1, MET, MLH1, MSH2, MSH3, MSH6, MUTYH, NBN, NF1, NF2, NTHL1, PALB2, PHOX2B, PMS2, POT1, PRKAR1A, PTCH1, PTEN, RAD51C, RAD51D, RB1, RECQL, RET, SDHA, SDHAF2, SDHB, SDHC, SDHD, SMAD4, SMARCA4,  SMARCB1, SMARCE1, STK11, SUFU, TMEM127, TP53, TSC1, TSC2, VHL and XRCC2 (sequencing and deletion/duplication); EGFR, EGLN1, HOXB13, KIT, MITF, PDGFRA, POLD1, and POLE (sequencing only); EPCAM and GREM1 (deletion/duplication only).    03/18/2023 -  Chemotherapy   Patient is on Treatment Plan : BLADDER Pembrolizumab  (200) q21d     Malignant neoplasm of trigone of urinary bladder (HCC)  09/12/2020 Initial Diagnosis   Malignant neoplasm of trigone of urinary bladder (HCC)   09/26/2020 - 10/31/2020 Chemotherapy   Patient is on Treatment Plan : BLADDER Gemcitabine  q7d        Discussed the use of AI scribe software for clinical note transcription with the patient, who gave verbal consent to proceed.  History of Present Illness Keith [name] is an 83 year old male with bladder cancer who presents for follow-up.  He experiences low energy levels and low-grade fevers every afternoon, with temperatures ranging from 99.58F to 101F, typically after spending time in his workshop. He has persistent back pain and weakness in his legs, which sometimes requires him to rest while walking short distances. His appetite is poor, yet he has gained weight despite not eating substantial meals. His diet mainly consists of peanut butter crackers and coffee, with occasional small portions of chicken and vegetables. He underwent a cystoscopy last week, which was clear. He has been on Keytruda  for a year, with plans to continue for another year, and experiences soreness at the site of his port. A recent abscess on his back was managed at home and has healed. He has not  traveled recently and has no significant respiratory symptoms.     All other systems were reviewed with the patient and are negative.  MEDICAL HISTORY:  Past Medical History:  Diagnosis Date   Bladder cancer (HCC)    Blind right eye    BPH (benign prostatic hypertrophy)    Cataracts, both eyes    Depression    DM type 2 (diabetes mellitus, type 2)  (HCC)    DOE (dyspnea on exertion)    with heavy exertion   Full dentures    GERD (gastroesophageal reflux disease)    Glaucoma no peripheral or side vision   right eye blind   Glaucoma, right eye    HOH (hard of hearing)    Hyperlipidemia    Hypertension    Hypothyroidism    Local recurrence of cancer of urinary bladder (HCC) urologsit-  dr matilda   1st dx 01-29-2016--  now recurrent 05-31-2016   Lower extremity edema 07/11/2020   right swells more than left goes down at night after sleeping   Lower urinary tract symptoms (LUTS) 2019   Migraine    Peripheral neuropathy    middle finger left hand   Pneumonia 2019   Skin abnormality    base of spine looks like pimple has had since 07-01-2020, no drainage area intact   Wears glasses     SURGICAL HISTORY: Past Surgical History:  Procedure Laterality Date   ANTERIOR CERVICAL DECOMP/DISCECTOMY FUSION  08-15-2000   C5 -- C6   ANTERIOR CERVICAL DECOMP/DISCECTOMY FUSION  03-04-2008   C6 -- C7   areas removed from back at spine  2021 jan and aug 2021   CYSTOSCOPY W/ RETROGRADES Left 05/11/2020   Procedure: Cystoscopy with left retrograde ureteropyelogram, resection of probable bladder tumor and left trigonal/ureteral orifice region, 3 cm in size, fluoroscopic interpretation, left ureteroscopy, placement of 6 French by 26 cm contour double-J stent without tether;  Surgeon: matilda Senior, MD;  Location: [facility];  Service: Urology;  Laterality: Left;   CYSTOSCOPY W/ RETROGRADES Bilateral 06/24/2022   Procedure: CYSTOSCOPY WITH RETROGRADE PYELOGRAM;  Surgeon: matilda Senior, MD;  Location: [facility];  Service: Urology;  Laterality: Bilateral;  1 HR   CYSTOSCOPY WITH BIOPSY N/A 01/29/2016   Procedure: CYSTOSCOPY WITH BIOPSY;  Surgeon: Senior matilda, MD;  Location: [facility];  Service: Urology;  Laterality: N/A;   CYSTOSCOPY WITH BIOPSY N/A 05/31/2016   Procedure: CYSTOSCOPY WITH BIOPSY;   Surgeon: Senior matilda, MD;  Location: [facility];  Service: Urology;  Laterality: N/A;   CYSTOSCOPY WITH BIOPSY N/A 06/24/2022   Procedure: CYSTOSCOPY WITH BIOPSY;  Surgeon: matilda Senior, MD;  Location: [facility];  Service: Urology;  Laterality: N/A;   EYE SURGERY Right 1998 & 2000   Glu[place]   GLAUCOMA SURGERY  1998 and 2000   IR IMAGING GUIDED PORT INSERTION  03/31/2023   lower back  06/01/2019, 02-10-2020   nose cancer  areas removed  2019, 2021   x 2 / 3/ 2021 small area removed from nose   surgery for sacral fracture  40 yrs ago   TRANSURETHRAL RESECTION OF BLADDER TUMOR N/A 07/17/2020   Procedure: TRANSURETHRAL RESECTION OF BLADDER TUMOR (TURBT);  Surgeon: matilda Senior, MD;  Location: [facility];  Service: Urology;  Laterality: N/A;    I have reviewed the social history and family history with the patient and they are unchanged from previous note.  ALLERGIES:  has no known allergies.  MEDICATIONS:  Current Outpatient Medications  Medication Sig Dispense Refill   acetaminophen  (TYLENOL ) 500 MG tablet Take 1,000 mg by mouth every 8 (eight) hours as needed for moderate pain.     amLODipine  (NORVASC ) 10 MG tablet Take 10 mg by mouth daily.     atorvastatin  (LIPITOR) 20 MG tablet Take 20 mg by mouth at bedtime.  (Patient taking differently: Take 10 mg by mouth at bedtime.)     carteolol (OCUPRESS) 1 % ophthalmic solution Place 1 drop into the right eye every morning.     cetirizine (ZYRTEC) 10 MG tablet Take 10 mg by mouth daily as needed for allergies.     clotrimazole-betamethasone  (LOTRISONE) lotion Apply 1 Application topically daily as needed (irritation).     glimepiride  (AMARYL ) 1 MG tablet Take 1 mg by mouth daily with breakfast.     ibuprofen (ADVIL) 200 MG tablet Take 600 mg by mouth every 8 (eight) hours as needed for moderate pain.     latanoprost (XALATAN) 0.005 % ophthalmic solution Place 1 drop into the right eye at bedtime.      levothyroxine  (SYNTHROID , LEVOTHROID) 100 MCG tablet Take 100 mcg by mouth daily before breakfast.      lidocaine -prilocaine  (EMLA ) cream Apply 1 Application topically as needed. 30 g 0   losartan  (COZAAR ) 100 MG tablet Take 100 mg by mouth at bedtime.      meloxicam (MOBIC) 15 MG tablet Take 15 mg by mouth daily.     Misc Natural Products (COMPLETE PROSTATE HEALTH PO) Take 1 tablet by mouth 2 (two) times daily.     omeprazole (PRILOSEC) 20 MG capsule Take 20 mg by mouth daily.     oxybutynin  (DITROPAN -XL) 5 MG 24 hr tablet TAKE 1 TABLET (5 MG TOTAL) BY MOUTH ONCE A WEEK. TAKE 2 HOURS BEFORE SCHEDULED CHEMO IN OUR OFFICE 90 tablet 1   oxymetazoline (AFRIN) 0.05 % nasal spray Place 1 spray into both nostrils 2 (two) times daily.     propranolol  (INDERAL ) 20 MG tablet TAKE 1 TABLET BY MOUTH TWICE A DAY 180 tablet 1   sitaGLIPtin (JANUVIA) 100 MG tablet Take 100 mg by mouth every morning.     tamsulosin  (FLOMAX ) 0.4 MG CAPS capsule Take 0.4 mg by mouth every morning.      vitamin B-12 (CYANOCOBALAMIN ) 500 MCG tablet Take 500 mcg by mouth daily.     No current facility-administered medications for this visit.    PHYSICAL EXAMINATION: ECOG PERFORMANCE STATUS: 2 - Symptomatic, <50% confined to bed  Vitals:   03/09/24 0840 03/09/24 0841  BP: (!) 145/63 129/67  Pulse: (!) 59   Resp: 17   Temp: 98.4 F (36.9 C)   SpO2: 99%    Wt Readings from Last 3 Encounters:  03/09/24 186 lb 6.4 oz (84.6 kg)  02/17/24 183 lb 4 oz (83.1 kg)  01/27/24 183 lb 6.4 oz (83.2 kg)     GENERAL:alert, no distress and comfortable SKIN: skin color, texture, turgor are normal, no rashes or significant lesions EYES: normal, Conjunctiva are pink and non-injected, sclera clear NECK: supple, thyroid  normal size, non-tender, without nodularity LYMPH:  no palpable lymphadenopathy in the cervical, axillary  LUNGS: clear to auscultation and percussion with normal breathing effort HEART: regular rate & rhythm and no  murmurs and no lower extremity edema ABDOMEN:abdomen soft, non-tender and normal bowel sounds Musculoskeletal:no cyanosis of digits and no clubbing  NEURO: alert & oriented x 3 with fluent speech, no focal motor/sensory deficits  Physical Exam  LABORATORY DATA:  I have reviewed the data as listed    Latest Ref Rng & Units 03/09/2024    8:17 AM 02/17/2024    9:04 AM 01/27/2024    8:49 AM  CBC  WBC 4.0 - 10.5 K/uL 5.3  5.3  5.7   Hemoglobin 13.0 - 17.0 g/dL 87.5  87.2  87.7   Hematocrit 39.0 - 52.0 % 36.0  36.6  35.9   Platelets 150 - 400 K/uL 189  157  169         Latest Ref Rng & Units 03/09/2024    8:17 AM 02/17/2024    9:04 AM 01/27/2024    8:49 AM  CMP  Glucose 70 - 99 mg/dL 798  779  785   BUN 8 - 23 mg/dL 14  11  13    Creatinine 0.61 - 1.24 mg/dL 9.03  9.07  9.14   Sodium 135 - 145 mmol/L 139  139  139   Potassium 3.5 - 5.1 mmol/L 3.9  3.9  4.2   Chloride 98 - 111 mmol/L 107  105  106   CO2 22 - 32 mmol/L 27  28  27    Calcium  8.9 - 10.3 mg/dL 9.0  9.2  9.0   Total Protein 6.5 - 8.1 g/dL 7.0  6.8  6.9   Total Bilirubin 0.0 - 1.2 mg/dL 0.5  0.4  0.4   Alkaline Phos 38 - 126 U/L 46  52  47   AST 15 - 41 U/L 16  17  16    ALT 0 - 44 U/L 13  16  16        RADIOGRAPHIC STUDIES: I have personally reviewed the radiological images as listed and agreed with the findings in the report. No results found.    Orders Placed This Encounter  Procedures   CBC with Differential (Cancer Center Only)    Standing Status:   Future    Expected Date:   03/30/2024    Expiration Date:   03/30/2025   CMP (Cancer Center only)    Standing Status:   Future    Expected Date:   03/30/2024    Expiration Date:   03/30/2025   T4    Standing Status:   Future    Expected Date:   03/30/2024    Expiration Date:   03/30/2025   TSH    Standing Status:   Future    Expected Date:   03/30/2024    Expiration Date:   03/30/2025   CBC with Differential (Cancer Center Only)    Standing Status:   Future     Expected Date:   04/20/2024    Expiration Date:   04/20/2025   CMP (Cancer Center only)    Standing Status:   Future    Expected Date:   04/20/2024    Expiration Date:   04/20/2025   T4    Standing Status:   Future    Expected Date:   04/20/2024    Expiration Date:   04/20/2025   TSH    Standing Status:   Future    Expected Date:   04/20/2024    Expiration Date:   04/20/2025   All questions were answered. The patient knows to call the clinic with any problems, questions or concerns. No barriers to learning was detected. The total time spent in the appointment was 30 minutes, including review of chart and various tests results, discussions about plan of care and coordination of care plan  Onita Mattock, MD 03/09/2024

## 2024-03-10 DIAGNOSIS — M79605 Pain in left leg: Secondary | ICD-10-CM | POA: Diagnosis not present

## 2024-03-10 DIAGNOSIS — M79604 Pain in right leg: Secondary | ICD-10-CM | POA: Diagnosis not present

## 2024-03-10 DIAGNOSIS — R262 Difficulty in walking, not elsewhere classified: Secondary | ICD-10-CM | POA: Diagnosis not present

## 2024-03-10 DIAGNOSIS — R531 Weakness: Secondary | ICD-10-CM | POA: Diagnosis not present

## 2024-03-10 LAB — T4: T4, Total: 10.1 ug/dL (ref 4.5–12.0)

## 2024-03-15 DIAGNOSIS — M79604 Pain in right leg: Secondary | ICD-10-CM | POA: Diagnosis not present

## 2024-03-15 DIAGNOSIS — R262 Difficulty in walking, not elsewhere classified: Secondary | ICD-10-CM | POA: Diagnosis not present

## 2024-03-15 DIAGNOSIS — M79605 Pain in left leg: Secondary | ICD-10-CM | POA: Diagnosis not present

## 2024-03-15 DIAGNOSIS — R531 Weakness: Secondary | ICD-10-CM | POA: Diagnosis not present

## 2024-03-17 ENCOUNTER — Telehealth: Payer: Self-pay

## 2024-03-17 ENCOUNTER — Other Ambulatory Visit: Payer: Self-pay

## 2024-03-17 DIAGNOSIS — C679 Malignant neoplasm of bladder, unspecified: Secondary | ICD-10-CM | POA: Diagnosis not present

## 2024-03-17 DIAGNOSIS — R319 Hematuria, unspecified: Secondary | ICD-10-CM | POA: Diagnosis not present

## 2024-03-17 DIAGNOSIS — R5381 Other malaise: Secondary | ICD-10-CM | POA: Diagnosis not present

## 2024-03-17 DIAGNOSIS — B379 Candidiasis, unspecified: Secondary | ICD-10-CM | POA: Diagnosis not present

## 2024-03-17 DIAGNOSIS — L282 Other prurigo: Secondary | ICD-10-CM | POA: Diagnosis not present

## 2024-03-17 DIAGNOSIS — I10 Essential (primary) hypertension: Secondary | ICD-10-CM | POA: Diagnosis not present

## 2024-03-17 DIAGNOSIS — E1169 Type 2 diabetes mellitus with other specified complication: Secondary | ICD-10-CM | POA: Diagnosis not present

## 2024-03-17 DIAGNOSIS — N390 Urinary tract infection, site not specified: Secondary | ICD-10-CM | POA: Diagnosis not present

## 2024-03-17 DIAGNOSIS — R300 Dysuria: Secondary | ICD-10-CM | POA: Diagnosis not present

## 2024-03-17 NOTE — Telephone Encounter (Signed)
 Received a telephone call from the pt's spouse to make Dr. Lanny aware that the patient was seen at his PCP's office and dx w/ Yeast and bladder infection. Spouse stated patient was given an Rx for an oral pill and lotion (name unknown). Let spouse know that I wold make Dr. Lanny aware.

## 2024-03-19 DIAGNOSIS — M25511 Pain in right shoulder: Secondary | ICD-10-CM | POA: Diagnosis not present

## 2024-03-23 DIAGNOSIS — R262 Difficulty in walking, not elsewhere classified: Secondary | ICD-10-CM | POA: Diagnosis not present

## 2024-03-23 DIAGNOSIS — M79605 Pain in left leg: Secondary | ICD-10-CM | POA: Diagnosis not present

## 2024-03-23 DIAGNOSIS — R531 Weakness: Secondary | ICD-10-CM | POA: Diagnosis not present

## 2024-03-23 DIAGNOSIS — M79604 Pain in right leg: Secondary | ICD-10-CM | POA: Diagnosis not present

## 2024-03-26 DIAGNOSIS — M79604 Pain in right leg: Secondary | ICD-10-CM | POA: Diagnosis not present

## 2024-03-26 DIAGNOSIS — R262 Difficulty in walking, not elsewhere classified: Secondary | ICD-10-CM | POA: Diagnosis not present

## 2024-03-26 DIAGNOSIS — M79605 Pain in left leg: Secondary | ICD-10-CM | POA: Diagnosis not present

## 2024-03-26 DIAGNOSIS — R531 Weakness: Secondary | ICD-10-CM | POA: Diagnosis not present

## 2024-03-30 ENCOUNTER — Encounter: Payer: Self-pay | Admitting: Hematology

## 2024-03-30 ENCOUNTER — Ambulatory Visit: Admitting: Nurse Practitioner

## 2024-03-30 ENCOUNTER — Other Ambulatory Visit

## 2024-03-30 ENCOUNTER — Inpatient Hospital Stay (HOSPITAL_BASED_OUTPATIENT_CLINIC_OR_DEPARTMENT_OTHER): Admitting: Hematology

## 2024-03-30 ENCOUNTER — Inpatient Hospital Stay: Attending: Hematology

## 2024-03-30 ENCOUNTER — Inpatient Hospital Stay

## 2024-03-30 ENCOUNTER — Ambulatory Visit

## 2024-03-30 VITALS — BP 152/66 | HR 69 | Temp 98.3°F | Resp 16 | Ht 70.0 in | Wt 181.2 lb

## 2024-03-30 DIAGNOSIS — C670 Malignant neoplasm of trigone of bladder: Secondary | ICD-10-CM | POA: Diagnosis not present

## 2024-03-30 DIAGNOSIS — Z79620 Long term (current) use of immunosuppressive biologic: Secondary | ICD-10-CM | POA: Insufficient documentation

## 2024-03-30 DIAGNOSIS — C671 Malignant neoplasm of dome of bladder: Secondary | ICD-10-CM

## 2024-03-30 DIAGNOSIS — R509 Fever, unspecified: Secondary | ICD-10-CM | POA: Diagnosis not present

## 2024-03-30 DIAGNOSIS — Z5112 Encounter for antineoplastic immunotherapy: Secondary | ICD-10-CM | POA: Diagnosis not present

## 2024-03-30 DIAGNOSIS — C679 Malignant neoplasm of bladder, unspecified: Secondary | ICD-10-CM | POA: Diagnosis not present

## 2024-03-30 DIAGNOSIS — Z95828 Presence of other vascular implants and grafts: Secondary | ICD-10-CM

## 2024-03-30 LAB — CMP (CANCER CENTER ONLY)
ALT: 16 U/L (ref 0–44)
AST: 12 U/L — ABNORMAL LOW (ref 15–41)
Albumin: 4.2 g/dL (ref 3.5–5.0)
Alkaline Phosphatase: 48 U/L (ref 38–126)
Anion gap: 5 (ref 5–15)
BUN: 20 mg/dL (ref 8–23)
CO2: 28 mmol/L (ref 22–32)
Calcium: 9.1 mg/dL (ref 8.9–10.3)
Chloride: 102 mmol/L (ref 98–111)
Creatinine: 0.94 mg/dL (ref 0.61–1.24)
GFR, Estimated: >60 mL/min (ref >60)
Glucose, Bld: 280 mg/dL — ABNORMAL HIGH (ref 70–99)
Potassium: 4.1 mmol/L (ref 3.5–5.1)
Sodium: 135 mmol/L (ref 135–145)
Total Bilirubin: 0.5 mg/dL (ref 0.0–1.2)
Total Protein: 6.8 g/dL (ref 6.5–8.1)

## 2024-03-30 LAB — CBC WITH DIFFERENTIAL (CANCER CENTER ONLY)
Abs Immature Granulocytes: 0.06 K/uL (ref 0.00–0.07)
Basophils Absolute: 0 K/uL (ref 0.0–0.1)
Basophils Relative: 1 %
Eosinophils Absolute: 0.2 K/uL (ref 0.0–0.5)
Eosinophils Relative: 3 %
HCT: 37 % — ABNORMAL LOW (ref 39.0–52.0)
Hemoglobin: 12.9 g/dL — ABNORMAL LOW (ref 13.0–17.0)
Immature Granulocytes: 1 %
Lymphocytes Relative: 17 %
Lymphs Abs: 1.3 K/uL (ref 0.7–4.0)
MCH: 30.5 pg (ref 26.0–34.0)
MCHC: 34.9 g/dL (ref 30.0–36.0)
MCV: 87.5 fL (ref 80.0–100.0)
Monocytes Absolute: 0.7 K/uL (ref 0.1–1.0)
Monocytes Relative: 9 %
Neutro Abs: 5.6 K/uL (ref 1.7–7.7)
Neutrophils Relative %: 69 %
Platelet Count: 184 K/uL (ref 150–400)
RBC: 4.23 MIL/uL (ref 4.22–5.81)
RDW: 12.9 % (ref 11.5–15.5)
WBC Count: 7.8 K/uL (ref 4.0–10.5)
nRBC: 0 % (ref 0.0–0.2)

## 2024-03-30 LAB — TSH: TSH: 0.888 u[IU]/mL (ref 0.350–4.500)

## 2024-03-30 MED ORDER — SODIUM CHLORIDE 0.9 % IV SOLN: Freq: Once | INTRAVENOUS | Status: AC

## 2024-03-30 MED ORDER — SODIUM CHLORIDE 0.9 % IV SOLN
200.0000 mg | Freq: Once | INTRAVENOUS | Status: AC
  Administered 2024-03-30: 200 mg via INTRAVENOUS
  Filled 2024-03-30: qty 8

## 2024-03-30 MED ORDER — SODIUM CHLORIDE 0.9% FLUSH
10.0000 mL | INTRAVENOUS | Status: DC | PRN
  Administered 2024-03-30: 10 mL

## 2024-03-30 NOTE — Assessment & Plan Note (Addendum)
-  Stage I, now muscular invasive, initially diagnosed in 2017  -He was found to have high-grade urothelial carcinoma without muscle invasion and developed and subsequently BCG refractory disease in December 2021. He received Intravesicular gemcitabine  with a total of 2000 mg weekly X6 started in February 2022  -bladder biopsies, bladder washings on 06/24/2022 showed focal urothelial carcinoma in situ (CIS) and cytology was positive for high grade urothelial carcinoma -I recommended intravesical gemcitabine  2000 mg weekly x 6, he started on 08/28/16.  He tolerated first cycle poorly with significant hematuria, and intermittent fever for week after treatment. -Due to his poor tolerance to intravesical gemcitabine , I changed therapy to intravesical mitomycin  40 mg every week for 5 weeks. He tolerated the treatment very well  -His surveillance cystoscopy and biopsy were negative for residual cancer -Given his good response to intravesical mitomycin , and his high risk of recurrence, Dr. Matilda recommend small intravesical therapy.  Patient agrees to proceed, started in in April 2024. Unfortunately he has had recurrent leakage from the intravesical therapy, and not able to continue. -Treatment was switched to Keytruda  on March 18, 2023, he has been tolerating well overall.  Plan to continue every 3 weeks for up to 2 years if no recurrence.

## 2024-03-30 NOTE — Progress Notes (Signed)
 [facility]   [phone] Fax:([phone]) [phone]   Clinic Follow up Note   Patient Care Team: Valentin Skates, DO as PCP - General (Internal Medicine) Lanny Callander, MD as Attending Physician (Hematology and Oncology) Matilda Senior, MD as Consulting Physician (Urology)  Date of Service:  03/30/2024  CHIEF COMPLAINT: f/u of bladder cancer   CURRENT THERAPY:  Keytruda  200 mg every 3 weeks  Oncology History   Bladder cancer (HCC) -Stage I, now muscular invasive, initially diagnosed in 2017  -He was found to have high-grade urothelial carcinoma without muscle invasion and developed and subsequently BCG refractory disease in December 2021. He received Intravesicular gemcitabine  with a total of 2000 mg weekly X6 started in February 2022  -bladder biopsies, bladder washings on 06/24/2022 showed focal urothelial carcinoma in situ (CIS) and cytology was positive for high grade urothelial carcinoma -I recommended intravesical gemcitabine  2000 mg weekly x 6, he started on 08/28/16.  He tolerated first cycle poorly with significant hematuria, and intermittent fever for week after treatment. -Due to his poor tolerance to intravesical gemcitabine , I changed therapy to intravesical mitomycin  40 mg every week for 5 weeks. He tolerated the treatment very well  -His surveillance cystoscopy and biopsy were negative for residual cancer -Given his good response to intravesical mitomycin , and his high risk of recurrence, Dr. Matilda recommend small intravesical therapy.  Patient agrees to proceed, started in in April 2024. Unfortunately he has had recurrent leakage from the intravesical therapy, and not able to continue. -Treatment was switched to Keytruda  on March 18, 2023, he has been tolerating well overall.  Plan to continue every 3 weeks for up to 2 years if no recurrence.  Assessment & Plan Bladder cancer, on immunotherapy Bladder cancer is being managed with immunotherapy to  prevent recurrence. No evidence of recurrence on recent cystoscopy.  Fever of unknown origin Persistent fever despite two weeks of antibiotic therapy for presumed urinary tract infection. Fever occurs daily, typically in the afternoon and sometimes at night. Differential includes possible bloodstream infection or other chronic infection. - Order blood cultures to check for bloodstream infection - Order CT scan to evaluate for other potential sources of infection - Refer to infectious disease specialist for further evaluation  Urinary tract infection not responding to antibiotics Severe bladder infection diagnosed, but fever persists despite two weeks of Cipro. Unlikely to be a bladder infection as Cipro would typically resolve symptoms.  Oral and genital candidiasis (oral thrush and intertrigo) Presence of oral thrush and intertrigo in the genital area. - Use nystatin powder or cream to manage candidiasis - Maintain dryness in affected areas to prevent fungal growth  Unintentional weight loss Unintentional weight loss of four pounds over three weeks despite increased appetite and food intake. No associated diarrhea or significant gastrointestinal symptoms.  Generalized muscle weakness and leg fatigue Reports of generalized muscle weakness and leg fatigue. Currently undergoing physical therapy, but unsure of its effectiveness. Experiences cramps in ankles post-exercise.  Plan - Lab reviewed, adequate for treatment, will proceed with Keytruda  today and continue every 3 weeks - Due to his intermittent daily fever, I will obtain blood culture x 2 today, CT chest, abdomen pelvis with contrast in the next few weeks, and refer him to ID for further workup. - Follow-up in 3 weeks     SUMMARY OF ONCOLOGIC HISTORY: Oncology History Overview Note   Cancer Staging  Bladder cancer [facility]) Staging form: Urinary Bladder, AJCC 8th Edition - Clinical: Stage I (cT1, cN0, cM0) -  Signed by Amadeo Windell SAILOR, MD on 09/12/2020 WHO/ISUP grade (low/high): High Grade Histologic grading system: 2 grade system - Pathologic stage from 06/24/2022: Stage 0is (pTis, pN0, cM0) - Signed by Lanny Callander, MD on 08/24/2022 Stage prefix: Initial diagnosis WHO/ISUP grade (low/high): High Grade Histologic grading system: 2 grade system     Bladder cancer (HCC)  09/12/2020 Initial Diagnosis   Bladder cancer (HCC)   09/12/2020 Cancer Staging   Staging form: Urinary Bladder, AJCC 8th Edition - Clinical: Stage I (cT1, cN0, cM0) - Signed by Amadeo Windell SAILOR, MD on 09/12/2020 WHO/ISUP grade (low/high): High Grade Histologic grading system: 2 grade system   06/24/2022 Pathology Results             FINAL MICROSCOPIC DIAGNOSIS:  A. BLADDER, BIOPSY: Focal urothelial carcinoma in situ (CIS)     06/24/2022 Cancer Staging   Staging form: Urinary Bladder, AJCC 8th Edition - Pathologic stage from 06/24/2022: Stage 0is (pTis, pN0, cM0) - Signed by Lanny Callander, MD on 08/24/2022 Stage prefix: Initial diagnosis WHO/ISUP grade (low/high): High Grade Histologic grading system: 2 grade system   08/28/2022 - 08/28/2022 Chemotherapy   Patient is on Treatment Plan : BLADDER Gemcitabine  INTRAVESICAL (2000) q7d     09/12/2022 - 02/26/2023 Chemotherapy   Patient is on Treatment Plan : BLADDER Mitomycin  INTRAVESICAL (40 mg) q7d     10/25/2022 Genetic Testing   Negative [facility] CancerNext-Expanded +RNA Panel.  Report date is 10/25/2022.   The CancerNext-Expanded gene panel offered by [facility] and includes sequencing, rearrangement, and RNA analysis for the following 77 genes: AIP, ALK, APC, ATM, AXIN2, BAP1, BARD1, BLM, BMPR1A, BRCA1, BRCA2, BRIP1, CDC73, CDH1, CDK4, CDKN1B, CDKN2A, CHEK2, CTNNA1, DICER1, FANCC, FH, FLCN, GALNT12, KIF1B, LZTR1, MAX, MEN1, MET, MLH1, MSH2, MSH3, MSH6, MUTYH, NBN, NF1, NF2, NTHL1, PALB2, PHOX2B, PMS2, POT1, PRKAR1A, PTCH1, PTEN, RAD51C, RAD51D, RB1, RECQL, RET, SDHA, SDHAF2, SDHB, SDHC, SDHD, SMAD4,  SMARCA4, SMARCB1, SMARCE1, STK11, SUFU, TMEM127, TP53, TSC1, TSC2, VHL and XRCC2 (sequencing and deletion/duplication); EGFR, EGLN1, HOXB13, KIT, MITF, PDGFRA, POLD1, and POLE (sequencing only); EPCAM and GREM1 (deletion/duplication only).    03/18/2023 -  Chemotherapy   Patient is on Treatment Plan : BLADDER Pembrolizumab  (200) q21d     Malignant neoplasm of trigone of urinary bladder (HCC)  09/12/2020 Initial Diagnosis   Malignant neoplasm of trigone of urinary bladder (HCC)   09/26/2020 - 10/31/2020 Chemotherapy   Patient is on Treatment Plan : BLADDER Gemcitabine  q7d        Discussed the use of AI scribe software for clinical note transcription with the patient, who gave verbal consent to proceed.  History of Present Illness Keith [name] is an 83 year old male with bladder cancer who presents for follow-up. He is accompanied by his wife, Mrs. Feutz.  He is undergoing immunotherapy treatments for bladder cancer to prevent recurrence. A recent cystoscopy did not reveal any new findings.  He experiences a persistent fever every afternoon around 4 PM and sometimes at night, which has not subsided despite being on Cipro for a severe bladder infection for almost two weeks. He has lost four pounds in the past three weeks despite maintaining or increasing his food intake.  He has symptoms of a urinary tract infection and a yellowish tongue due to thrush, which he is treating with mouthwash. A rash on his privates appears red and raw-looking, and he is using a cream to keep the area dry.  He experiences weakness in his legs, making it difficult to  walk, and has been attending physical therapy for about two to three weeks, completing seven sessions. He reports cramps in his ankles after exercises and has felt nauseated, with one episode of vomiting. Despite these symptoms, he maintains a good appetite, although he sometimes feels less hungry.     All other systems were reviewed with the  patient and are negative.  MEDICAL HISTORY:  Past Medical History:  Diagnosis Date   Bladder cancer (HCC)    Blind right eye    BPH (benign prostatic hypertrophy)    Cataracts, both eyes    Depression    DM type 2 (diabetes mellitus, type 2) (HCC)    DOE (dyspnea on exertion)    with heavy exertion   Full dentures    GERD (gastroesophageal reflux disease)    Glaucoma no peripheral or side vision   right eye blind   Glaucoma, right eye    HOH (hard of hearing)    Hyperlipidemia    Hypertension    Hypothyroidism    Local recurrence of cancer of urinary bladder (HCC) urologsit-  dr matilda   1st dx 01-29-2016--  now recurrent 05-31-2016   Lower extremity edema 07/11/2020   right swells more than left goes down at night after sleeping   Lower urinary tract symptoms (LUTS) 2019   Migraine    Peripheral neuropathy    middle finger left hand   Pneumonia 2019   Skin abnormality    base of spine looks like pimple has had since 07-01-2020, no drainage area intact   Wears glasses     SURGICAL HISTORY: Past Surgical History:  Procedure Laterality Date   ANTERIOR CERVICAL DECOMP/DISCECTOMY FUSION  08-15-2000   C5 -- C6   ANTERIOR CERVICAL DECOMP/DISCECTOMY FUSION  03-04-2008   C6 -- C7   areas removed from back at spine  2021 jan and aug 2021   CYSTOSCOPY W/ RETROGRADES Left 05/11/2020   Procedure: Cystoscopy with left retrograde ureteropyelogram, resection of probable bladder tumor and left trigonal/ureteral orifice region, 3 cm in size, fluoroscopic interpretation, left ureteroscopy, placement of 6 French by 26 cm contour double-J stent without tether;  Surgeon: matilda Senior, MD;  Location: [facility];  Service: Urology;  Laterality: Left;   CYSTOSCOPY W/ RETROGRADES Bilateral 06/24/2022   Procedure: CYSTOSCOPY WITH RETROGRADE PYELOGRAM;  Surgeon: matilda Senior, MD;  Location: [facility];  Service: Urology;  Laterality: Bilateral;  1 HR   CYSTOSCOPY WITH  BIOPSY N/A 01/29/2016   Procedure: CYSTOSCOPY WITH BIOPSY;  Surgeon: Senior matilda, MD;  Location: [facility];  Service: Urology;  Laterality: N/A;   CYSTOSCOPY WITH BIOPSY N/A 05/31/2016   Procedure: CYSTOSCOPY WITH BIOPSY;  Surgeon: Senior matilda, MD;  Location: [facility];  Service: Urology;  Laterality: N/A;   CYSTOSCOPY WITH BIOPSY N/A 06/24/2022   Procedure: CYSTOSCOPY WITH BIOPSY;  Surgeon: matilda Senior, MD;  Location: [facility];  Service: Urology;  Laterality: N/A;   EYE SURGERY Right 1998 & 2000   [place]   GLAUCOMA SURGERY  1998 and 2000   IR IMAGING GUIDED PORT INSERTION  03/31/2023   lower back  06/01/2019, 02-10-2020   nose cancer  areas removed  2019, 2021   x 2 / 3/ 2021 small area removed from nose   surgery for sacral fracture  40 yrs ago   TRANSURETHRAL RESECTION OF BLADDER TUMOR N/A 07/17/2020   Procedure: TRANSURETHRAL RESECTION OF BLADDER TUMOR (TURBT);  Surgeon: matilda Senior, MD;  Location: [facility];  Service: Urology;  Laterality: N/A;    I have reviewed the social history and family history with the patient and they are unchanged from previous note.  ALLERGIES:  has no known allergies.  MEDICATIONS:  Current Outpatient Medications  Medication Sig Dispense Refill   acetaminophen  (TYLENOL ) 500 MG tablet Take 1,000 mg by mouth every 8 (eight) hours as needed for moderate pain.     amLODipine  (NORVASC ) 10 MG tablet Take 10 mg by mouth daily.     atorvastatin  (LIPITOR) 20 MG tablet Take 20 mg by mouth at bedtime.  (Patient taking differently: Take 10 mg by mouth at bedtime.)     carteolol (OCUPRESS) 1 % ophthalmic solution Place 1 drop into the right eye every morning.     cetirizine (ZYRTEC) 10 MG tablet Take 10 mg by mouth daily as needed for allergies.     ciprofloxacin (CIPRO) 500 MG tablet Take 500 mg by mouth 2 (two) times daily.     clotrimazole-betamethasone  (LOTRISONE) lotion Apply 1 Application  topically daily as needed (irritation).     glimepiride  (AMARYL ) 1 MG tablet Take 1 mg by mouth daily with breakfast.     ibuprofen (ADVIL) 200 MG tablet Take 600 mg by mouth every 8 (eight) hours as needed for moderate pain.     latanoprost (XALATAN) 0.005 % ophthalmic solution Place 1 drop into the right eye at bedtime.     levothyroxine  (SYNTHROID , LEVOTHROID) 100 MCG tablet Take 100 mcg by mouth daily before breakfast.      lidocaine -prilocaine  (EMLA ) cream Apply 1 Application topically as needed. 30 g 0   losartan  (COZAAR ) 100 MG tablet Take 100 mg by mouth at bedtime.      meloxicam (MOBIC) 15 MG tablet Take 15 mg by mouth daily.     Misc Natural Products (COMPLETE PROSTATE HEALTH PO) Take 1 tablet by mouth 2 (two) times daily.     omeprazole (PRILOSEC) 20 MG capsule Take 20 mg by mouth daily.     oxybutynin  (DITROPAN -XL) 5 MG 24 hr tablet TAKE 1 TABLET (5 MG TOTAL) BY MOUTH ONCE A WEEK. TAKE 2 HOURS BEFORE SCHEDULED CHEMO IN OUR OFFICE 90 tablet 1   oxymetazoline (AFRIN) 0.05 % nasal spray Place 1 spray into both nostrils 2 (two) times daily.     propranolol  (INDERAL ) 20 MG tablet TAKE 1 TABLET BY MOUTH TWICE A DAY 180 tablet 1   sitaGLIPtin (JANUVIA) 100 MG tablet Take 100 mg by mouth every morning.     tamsulosin  (FLOMAX ) 0.4 MG CAPS capsule Take 0.4 mg by mouth every morning.      vitamin B-12 (CYANOCOBALAMIN ) 500 MCG tablet Take 500 mcg by mouth daily.     No current facility-administered medications for this visit.    PHYSICAL EXAMINATION: ECOG PERFORMANCE STATUS: 2 - Symptomatic, <50% confined to bed  Vitals:   03/30/24 1031 03/30/24 1032  BP: (!) 146/71 (!) 152/66  Pulse: 69   Resp: 16   Temp: 98.3 F (36.8 C)   SpO2: 99%    Wt Readings from Last 3 Encounters:  03/30/24 181 lb 4 oz (82.2 kg)  03/09/24 186 lb 6.4 oz (84.6 kg)  02/17/24 183 lb 4 oz (83.1 kg)     GENERAL:alert, no distress and comfortable SKIN: skin color, texture, turgor are normal, no rashes or  significant lesions EYES: normal, Conjunctiva are pink and non-injected, sclera clear NECK: supple, thyroid  normal size, non-tender, without nodularity LYMPH:  no palpable lymphadenopathy in the cervical, axillary  LUNGS: clear to auscultation and percussion with normal breathing effort HEART: regular rate & rhythm and no murmurs and no lower extremity edema ABDOMEN:abdomen soft, non-tender and normal bowel sounds Musculoskeletal:no cyanosis of digits and no clubbing  NEURO: alert & oriented x 3 with fluent speech, no focal motor/sensory deficits  Physical Exam HEENT: Tongue yellowish. ABDOMEN: Abdomen and liver non-tender. EXTREMITIES: No swelling in extremities.  LABORATORY DATA:  I have reviewed the data as listed    Latest Ref Rng & Units 03/30/2024   10:07 AM 03/09/2024    8:17 AM 02/17/2024    9:04 AM  CBC  WBC 4.0 - 10.5 K/uL 7.8  5.3  5.3   Hemoglobin 13.0 - 17.0 g/dL 87.0  87.5  87.2   Hematocrit 39.0 - 52.0 % 37.0  36.0  36.6   Platelets 150 - 400 K/uL 184  189  157         Latest Ref Rng & Units 03/30/2024   10:07 AM 03/09/2024    8:17 AM 02/17/2024    9:04 AM  CMP  Glucose 70 - 99 mg/dL 719  798  779   BUN 8 - 23 mg/dL 20  14  11    Creatinine 0.61 - 1.24 mg/dL 9.05  9.03  9.07   Sodium 135 - 145 mmol/L 135  139  139   Potassium 3.5 - 5.1 mmol/L 4.1  3.9  3.9   Chloride 98 - 111 mmol/L 102  107  105   CO2 22 - 32 mmol/L 28  27  28    Calcium  8.9 - 10.3 mg/dL 9.1  9.0  9.2   Total Protein 6.5 - 8.1 g/dL 6.8  7.0  6.8   Total Bilirubin 0.0 - 1.2 mg/dL 0.5  0.5  0.4   Alkaline Phos 38 - 126 U/L 48  46  52   AST 15 - 41 U/L 12  16  17    ALT 0 - 44 U/L 16  13  16        RADIOGRAPHIC STUDIES: I have personally reviewed the radiological images as listed and agreed with the findings in the report. No results found.    Orders Placed This Encounter  Procedures   Culture, blood (single) w Reflex to ID Panel    Standing Status:   Future    Number of Occurrences:   1     Expected Date:   03/30/2024    Expiration Date:   03/30/2025   Culture, blood (single) w Reflex to ID Panel    Standing Status:   Future    Number of Occurrences:   1    Expected Date:   03/30/2024    Expiration Date:   03/30/2025   CT CHEST ABDOMEN PELVIS W CONTRAST    Standing Status:   Future    Expected Date:   04/06/2024    Expiration Date:   03/30/2025    If indicated for the ordered procedure, I authorize the administration of contrast media per Radiology protocol:   Yes    Does the patient have a contrast media/X-ray dye allergy?:   No    Preferred imaging location?:   [facility]    If indicated for the ordered procedure, I authorize the administration of oral contrast media per Radiology protocol:   Yes   Ambulatory referral to Infectious Disease    Referral Priority:   Urgent    Referral Type:   Consultation    Referral Reason:   Specialty  Services Required    Requested Specialty:   Infectious Diseases    Number of Visits Requested:   1   All questions were answered. The patient knows to call the clinic with any problems, questions or concerns. No barriers to learning was detected. The total time spent in the appointment was 40 minutes, including review of chart and various tests results, discussions about plan of care and coordination of care plan     Onita Mattock, MD 03/30/2024

## 2024-03-30 NOTE — Patient Instructions (Signed)

## 2024-03-31 DIAGNOSIS — R531 Weakness: Secondary | ICD-10-CM | POA: Diagnosis not present

## 2024-03-31 DIAGNOSIS — R262 Difficulty in walking, not elsewhere classified: Secondary | ICD-10-CM | POA: Diagnosis not present

## 2024-03-31 DIAGNOSIS — M79604 Pain in right leg: Secondary | ICD-10-CM | POA: Diagnosis not present

## 2024-03-31 DIAGNOSIS — M79605 Pain in left leg: Secondary | ICD-10-CM | POA: Diagnosis not present

## 2024-03-31 LAB — T4: T4, Total: 9.6 ug/dL (ref 4.5–12.0)

## 2024-04-02 ENCOUNTER — Ambulatory Visit: Payer: Self-pay | Admitting: Nurse Practitioner

## 2024-04-02 DIAGNOSIS — R262 Difficulty in walking, not elsewhere classified: Secondary | ICD-10-CM | POA: Diagnosis not present

## 2024-04-02 DIAGNOSIS — M79605 Pain in left leg: Secondary | ICD-10-CM | POA: Diagnosis not present

## 2024-04-02 DIAGNOSIS — M79604 Pain in right leg: Secondary | ICD-10-CM | POA: Diagnosis not present

## 2024-04-02 DIAGNOSIS — R531 Weakness: Secondary | ICD-10-CM | POA: Diagnosis not present

## 2024-04-04 LAB — CULTURE, BLOOD (SINGLE)
Culture: NO GROWTH
Culture: NO GROWTH
Special Requests: ADEQUATE
Special Requests: ADEQUATE

## 2024-04-06 ENCOUNTER — Ambulatory Visit (HOSPITAL_COMMUNITY)
Admission: RE | Admit: 2024-04-06 | Discharge: 2024-04-06 | Disposition: A | Discharge status: Home / Self Care | Source: Ambulatory Visit | Attending: Hematology | Admitting: Hematology

## 2024-04-06 DIAGNOSIS — R509 Fever, unspecified: Secondary | ICD-10-CM | POA: Diagnosis not present

## 2024-04-06 DIAGNOSIS — N400 Enlarged prostate without lower urinary tract symptoms: Secondary | ICD-10-CM | POA: Insufficient documentation

## 2024-04-06 DIAGNOSIS — I700 Atherosclerosis of aorta: Secondary | ICD-10-CM | POA: Insufficient documentation

## 2024-04-06 DIAGNOSIS — I2510 Atherosclerotic heart disease of native coronary artery without angina pectoris: Secondary | ICD-10-CM | POA: Insufficient documentation

## 2024-04-06 DIAGNOSIS — Z79620 Long term (current) use of immunosuppressive biologic: Secondary | ICD-10-CM | POA: Insufficient documentation

## 2024-04-06 DIAGNOSIS — N200 Calculus of kidney: Secondary | ICD-10-CM | POA: Insufficient documentation

## 2024-04-06 DIAGNOSIS — C679 Malignant neoplasm of bladder, unspecified: Secondary | ICD-10-CM | POA: Insufficient documentation

## 2024-04-06 MED ORDER — IOHEXOL 300 MG/ML  SOLN
100.0000 mL | Freq: Once | INTRAMUSCULAR | Status: AC | PRN
  Administered 2024-04-06: 100 mL via INTRAVENOUS

## 2024-04-06 MED ORDER — HEPARIN SOD (PORK) LOCK FLUSH 100 UNIT/ML IV SOLN
500.0000 [IU] | Freq: Once | INTRAVENOUS | Status: AC
  Administered 2024-04-06: 500 [IU] via INTRAVENOUS

## 2024-04-08 DIAGNOSIS — M79604 Pain in right leg: Secondary | ICD-10-CM | POA: Diagnosis not present

## 2024-04-08 DIAGNOSIS — R262 Difficulty in walking, not elsewhere classified: Secondary | ICD-10-CM | POA: Diagnosis not present

## 2024-04-08 DIAGNOSIS — M79605 Pain in left leg: Secondary | ICD-10-CM | POA: Diagnosis not present

## 2024-04-08 DIAGNOSIS — R531 Weakness: Secondary | ICD-10-CM | POA: Diagnosis not present

## 2024-04-15 ENCOUNTER — Other Ambulatory Visit: Payer: Self-pay

## 2024-04-19 ENCOUNTER — Encounter: Payer: Self-pay | Admitting: Hematology

## 2024-04-19 NOTE — Progress Notes (Unsigned)
 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 He received Intravesicular gemcitabine  with a total of 2000 mg weekly X6 started in February 2022  -bladder biopsies, bladder washings on 06/24/2022 showed focal urothelial carcinoma in situ (CIS) and cytology was positive for high grade urothelial carcinoma -I recommended intravesical gemcitabine  2000 mg weekly x 6, he started on 08/28/16.  He tolerated first cycle poorly with significant hematuria, and intermittent fever for week after treatment. -Due to his poor tolerance to intravesical gemcitabine , I changed therapy to intravesical mitomycin  40 mg every week for 5 weeks. He tolerated the treatment very well  -His surveillance cystoscopy and biopsy were negative for residual cancer -Given his good response to intravesical mitomycin , and his high risk of recurrence, Dr. Matilda recommend small intravesical therapy.  Patient agreed to proceed, started in in April 2024. Unfortunately he has had recurrent leakage from the intravesical therapy, and not able to continue. -Treatment was switched to Keytruda  on March 18, 2023, he tolerated first cycle well.  Plan to continue every 3 weeks for up to 2 years if no recurrence. -He is overall tolerating treatment well, with mild fatigue. He does have some dry skin, he states all over. He does have some mild, atopic dermatitis noted on the cheek bone areas. He has just started to use Jergen's lotion for diabetics  which seems to help the dryness. no significant toxicity, will continue treatment. 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 He received Intravesicular gemcitabine  with a total of 2000 mg weekly X6 started in February 2022  -bladder biopsies, bladder washings on 06/24/2022 showed focal urothelial carcinoma in situ (CIS) and cytology was positive for high grade urothelial carcinoma -I recommended intravesical gemcitabine  2000 mg weekly x 6, he started on 08/28/16.  He tolerated first cycle poorly with significant hematuria, and intermittent fever for week after treatment. -Due to his poor tolerance to intravesical gemcitabine , I changed therapy to intravesical mitomycin  40 mg every week for 5 weeks. He tolerated the treatment very well  -His surveillance cystoscopy and biopsy were negative for residual cancer -Given his good response to intravesical mitomycin , and his high risk of recurrence, Dr. Matilda recommend small intravesical therapy.  Patient agreed to proceed, started in in April 2024. Unfortunately he has had recurrent leakage from the intravesical therapy, and not able to continue. -Treatment was switched to Keytruda  on March 18, 2023, he tolerated first cycle well.  Plan to continue every 3 weeks for up to 2 years if no recurrence. -He is overall tolerating treatment well, with mild fatigue. He does have some dry skin, he states all over. He does have some mild, atopic dermatitis noted on the cheek bone areas. He has just started to use Jergen's lotion for diabetics which seems to help the dryness. no significant toxicity, will continue treatment. -08/12/2023 - today, he presents for Cycle #8 day 1 . - Restaging CT CAP  done 04/08/2024 -no acute abnormality and no definitive evidence of recurrence or metastatic  disease. - 04/20/2024 -proceed with cycle 20 of IV Keytruda  200 mg. --Continue with Keytruda  q. 21 days.       Urinary frequency The patient states he is having to urinate every 30 minutes to 1 hour during the day.  He is getting up several times during the night to urinate also.  Previously, he was prescribed Ditropan  XL to help alleviate this symptom.  He has not taken this for some time.  His wife would like to know if this is something he can restart to see if it helps.  Recommend he restart using Ditropan , especially at night.  This might prevent multiple wake ups during the night may improve his energy levels.  Offered a new prescription, and the patient stated he has some still at home.  Back pain The patient states he has intermittent episodes of sharp back pain which radiates to both of his legs.  When it happens, he experiences leg weakness, often resulting in legs giving out.  He is currently having physical therapy to help regain strength.  They often massage legs to help with muscle cramping.  He states this does not seem to be helping.  Recent CT CAP did not show any obvious bony lesions.  He does have a history of lumbar and cervical disc disease.  Had surgical fusion about 2 years ago.  Has not seen his surgeon or orthopedist since the symptoms began approximately 4 months ago.  States he is able to take Tylenol  or Goody powder to relieve the pain.  He will also need to rest for 15 minutes.  He reports being able to return to normal activities later on in the same day.  Recommend he contact surgeon/orthopedic provider for further evaluation.  Fever and weight loss Reviewed recent blood cultures, all negative for bacterial growth.  Reviewed CT CAP done on 04/08/2024.  This showed no acute abnormalities and no evidence of recurrent or metastatic disease.  He is scheduled to see infectious disease tomorrow.  Encouraged him to keep this appointment as scheduled.  Plan Labs reviewed. -Mild  and stable anemia. - Unremarkable CMP. - TSH normal with T4 pending. Reviewed results of CT CAP showing no evidence of recurrence or metastatic disease. Patient labs and presentation are appropriate for treatment today. Proceed with Keytruda  200 mg IV. Labs/flush, follow-up, and immunotherapy Keytruda  in 3 weeks.   The patient understands the plans discussed today and is in agreement with them.  He knows to contact our office if he develops concerns prior to his next appointment.  I provided 25 minutes of face-to-face time during this encounter and > 50% was spent counseling as documented under my assessment and plan.    Keith FORBES Lessen, NP  [facility] - A DEPT OF [facility] [address] [place] [address] Dept: [phone] Dept Fax: [phone]   No orders of the defined types were placed in this encounter.     CHIEF COMPLAINT:  CC: recurrent bladder cancer   Current Treatment:  Keytruda  200 mg Q 21 days  INTERVAL HISTORY:  Joaopedro is here today for repeat clinical assessment. He last saw Dr. Lanny 03/30/2024.  Patient had recently been experiencing fever of unknown origin. This was initially assumed to be related to UTI. Blood cultures were negative. CT CAP with contrast showed no acute findings to explain intermittent  fever. There was no evidence of recurrence or metastatic disease.  He states that fevers have stopped.  Appetite slightly improved with only 1 pound of weight loss since he was last seen.  He is scheduled to see ID tomorrow.  He reports significant back pain which is intermittent in nature.  When it occurs, it will radiate down both legs causing weakness.  He has history of lumbar and cervical disc disease requiring surgical fusion.  Has not discussed with his surgeon.  CT CAP showed no obvious evidence of bony abnormalities or lesions.  He is also having urinary frequency.  Having to urinate every  30 minutes to 1 hour.  He is also getting up multiple times during the night to use the bathroom.  Previously, was prescribed Ditropan  XL 5 mg.  He has not taken this in some time.  He denies chest pain, chest pressure, or shortness of breath. He denies headaches or visual disturbances. He denies abdominal pain, nausea, vomiting, or changes in bowel or bladder habits.  He denies fevers or chills. His appetite is improved. His weight has been stable.  I have reviewed the past medical history, past surgical history, social history and family history with the patient and they are unchanged from previous note.  ALLERGIES:  has no known allergies.  MEDICATIONS:  Current Outpatient Medications  Medication Sig Dispense Refill   acetaminophen  (TYLENOL ) 500 MG tablet Take 1,000 mg by mouth every 8 (eight) hours as needed for moderate pain.     amLODipine  (NORVASC ) 10 MG tablet Take 10 mg by mouth daily.     atorvastatin  (LIPITOR) 20 MG tablet Take 20 mg by mouth at bedtime.  (Patient taking differently: Take 10 mg by mouth at bedtime.)     carteolol (OCUPRESS) 1 % ophthalmic solution Place 1 drop into the right eye every morning.     cetirizine (ZYRTEC) 10 MG tablet Take 10 mg by mouth daily as needed for allergies.     ciprofloxacin (CIPRO) 500 MG tablet Take 500 mg by mouth 2 (two) times daily.     clotrimazole-betamethasone  (LOTRISONE) lotion Apply 1 Application topically daily as needed (irritation).     glimepiride  (AMARYL ) 1 MG tablet Take 1 mg by mouth daily with breakfast.     ibuprofen (ADVIL) 200 MG tablet Take 600 mg by mouth every 8 (eight) hours as needed for moderate pain.     latanoprost (XALATAN) 0.005 % ophthalmic solution Place 1 drop into the right eye at bedtime.     levothyroxine  (SYNTHROID , LEVOTHROID) 100 MCG tablet Take 100 mcg by mouth daily before breakfast.      lidocaine -prilocaine  (EMLA ) cream Apply 1 Application topically as needed. 30 g 0   losartan  (COZAAR ) 100 MG tablet  Take 100 mg by mouth at bedtime.      meloxicam (MOBIC) 15 MG tablet Take 15 mg by mouth daily.     Misc Natural Products (COMPLETE PROSTATE HEALTH PO) Take 1 tablet by mouth 2 (two) times daily.     omeprazole (PRILOSEC) 20 MG capsule Take 20 mg by mouth daily.     oxybutynin  (DITROPAN -XL) 5 MG 24 hr tablet TAKE 1 TABLET (5 MG TOTAL) BY MOUTH ONCE A WEEK. TAKE 2 HOURS BEFORE SCHEDULED CHEMO IN OUR OFFICE 90 tablet 1   oxymetazoline (AFRIN) 0.05 % nasal spray Place 1 spray into both nostrils 2 (two) times daily.     propranolol  (INDERAL ) 20 MG tablet TAKE 1 TABLET BY MOUTH TWICE A DAY 180 tablet  1   sitaGLIPtin (JANUVIA) 100 MG tablet Take 100 mg by mouth every morning.     tamsulosin  (FLOMAX ) 0.4 MG CAPS capsule Take 0.4 mg by mouth every morning.      vitamin B-12 (CYANOCOBALAMIN ) 500 MCG tablet Take 500 mcg by mouth daily.     No current facility-administered medications for this visit.    HISTORY OF PRESENT ILLNESS:   Oncology History Overview Note   Cancer Staging  Bladder cancer [facility]) Staging form: Urinary Bladder, AJCC 8th Edition - Clinical: Stage I (cT1, cN0, cM0) - Signed by Amadeo Windell SAILOR, MD on 09/12/2020 WHO/ISUP grade (low/high): High Grade Histologic grading system: 2 grade system - Pathologic stage from 06/24/2022: Stage 0is (pTis, pN0, cM0) - Signed by Lanny Callander, MD on 08/24/2022 Stage prefix: Initial diagnosis WHO/ISUP grade (low/high): High Grade Histologic grading system: 2 grade system     Bladder cancer (HCC)  09/12/2020 Initial Diagnosis   Bladder cancer (HCC)   09/12/2020 Cancer Staging   Staging form: Urinary Bladder, AJCC 8th Edition - Clinical: Stage I (cT1, cN0, cM0) - Signed by Amadeo Windell SAILOR, MD on 09/12/2020 WHO/ISUP grade (low/high): High Grade Histologic grading system: 2 grade system   06/24/2022 Pathology Results             FINAL MICROSCOPIC DIAGNOSIS:  A. BLADDER, BIOPSY: Focal urothelial carcinoma in situ (CIS)     06/24/2022  Cancer Staging   Staging form: Urinary Bladder, AJCC 8th Edition - Pathologic stage from 06/24/2022: Stage 0is (pTis, pN0, cM0) - Signed by Lanny Callander, MD on 08/24/2022 Stage prefix: Initial diagnosis WHO/ISUP grade (low/high): High Grade Histologic grading system: 2 grade system   08/28/2022 - 08/28/2022 Chemotherapy   Patient is on Treatment Plan : BLADDER Gemcitabine  INTRAVESICAL (2000) q7d     09/12/2022 - 02/26/2023 Chemotherapy   Patient is on Treatment Plan : BLADDER Mitomycin  INTRAVESICAL (40 mg) q7d     10/25/2022 Genetic Testing   Negative [facility] CancerNext-Expanded +RNA Panel.  Report date is 10/25/2022.   The CancerNext-Expanded gene panel offered by [facility] and includes sequencing, rearrangement, and RNA analysis for the following 77 genes: AIP, ALK, APC, ATM, AXIN2, BAP1, BARD1, BLM, BMPR1A, BRCA1, BRCA2, BRIP1, CDC73, CDH1, CDK4, CDKN1B, CDKN2A, CHEK2, CTNNA1, DICER1, FANCC, FH, FLCN, GALNT12, KIF1B, LZTR1, MAX, MEN1, MET, MLH1, MSH2, MSH3, MSH6, MUTYH, NBN, NF1, NF2, NTHL1, PALB2, PHOX2B, PMS2, POT1, PRKAR1A, PTCH1, PTEN, RAD51C, RAD51D, RB1, RECQL, RET, SDHA, SDHAF2, SDHB, SDHC, SDHD, SMAD4, SMARCA4, SMARCB1, SMARCE1, STK11, SUFU, TMEM127, TP53, TSC1, TSC2, VHL and XRCC2 (sequencing and deletion/duplication); EGFR, EGLN1, HOXB13, KIT, MITF, PDGFRA, POLD1, and POLE (sequencing only); EPCAM and GREM1 (deletion/duplication only).    03/18/2023 -  Chemotherapy   Patient is on Treatment Plan : BLADDER Pembrolizumab  (200) q21d     Malignant neoplasm of trigone of urinary bladder (HCC)  09/12/2020 Initial Diagnosis   Malignant neoplasm of trigone of urinary bladder (HCC)   09/26/2020 - 10/31/2020 Chemotherapy   Patient is on Treatment Plan : BLADDER Gemcitabine  q7d     04/06/2024 Imaging   CT CAP with contrast  IMPRESSION: 1. No acute CT findings of the chest, abdomen, or pelvis to explain fever. 2. No evidence of bladder mass. No evidence of lymphadenopathy or metastatic disease in  the chest, abdomen, or pelvis. 3. Prostatomegaly. 4. Punctuate nonobstructive calculus of the superior pole of the left kidney. No right-sided calculi, ureteral calculi, or hydronephrosis. 5. Emphysema and diffuse bilateral bronchial wall thickening. 6. Coronary artery disease.   04/08/2024  Imaging   CT CAP with contrast IMPRESSION: 1. No acute CT findings of the chest, abdomen, or pelvis to explain fever. 2. No evidence of bladder mass. No evidence of lymphadenopathy or metastatic disease in the chest, abdomen, or pelvis. 3. Prostatomegaly. 4. Punctuate nonobstructive calculus of the superior pole of the left kidney. No right-sided calculi, ureteral calculi, or hydronephrosis. 5. Emphysema and diffuse bilateral bronchial wall thickening. 6. Coronary artery disease.       REVIEW OF SYSTEMS:   Constitutional: Denies fevers, chills or abnormal weight loss. Having fatigue.  Eyes: Denies blurriness of vision Ears, nose, mouth, throat, and face: Denies mucositis or sore throat Respiratory: Denies cough, dyspnea or wheezes Cardiovascular: Denies palpitation, chest discomfort or lower extremity swelling Gastrointestinal:  Denies nausea, heartburn or change in bowel habits Skin: Denies abnormal skin rashes Lymphatics: Denies new lymphadenopathy or easy bruising Neurological:Denies numbness, tingling or new weaknesses Behavioral/Psych: Mood is stable, no new changes  Musculoskeletal:.  Some intermittent back pain causing weakness in both legs. All other systems were reviewed with the patient and are negative.   VITALS:   Today's Vitals   04/20/24 0919 04/20/24 0924  BP: 132/60   Pulse: 79   Resp: 17   Temp: 97.8 F (36.6 C)   SpO2: 98%   Weight: 180 lb 1.6 oz (81.7 kg)   PainSc:  7    Body mass index is 25.84 kg/m.    Wt Readings from Last 3 Encounters:  04/20/24 180 lb 1.6 oz (81.7 kg)  03/30/24 181 lb 4 oz (82.2 kg)  03/09/24 186 lb 6.4 oz (84.6 kg)    Body mass  index is 25.84 kg/m.  Performance status (ECOG): 1 - Symptomatic but completely ambulatory  PHYSICAL EXAM:   GENERAL:alert, no distress and comfortable SKIN: skin color, texture, turgor are normal, no rashes or significant lesions EYES: normal, Conjunctiva are pink and non-injected, sclera clear OROPHARYNX:no exudate, no erythema and lips, buccal mucosa, and tongue normal  NECK: supple, thyroid  normal size, non-tender, without nodularity LYMPH:  no palpable lymphadenopathy in the cervical, axillary or inguinal LUNGS: clear to auscultation and percussion with normal breathing effort HEART: regular rate & rhythm and no murmurs and no lower extremity edema ABDOMEN:abdomen soft, non-tender and normal bowel sounds Musculoskeletal:no cyanosis of digits and no clubbing  NEURO: alert & oriented x 3 with fluent speech, no focal motor/sensory deficits  LABORATORY DATA:  I have reviewed the data as listed    Component Value Date/Time   NA 138 04/20/2024 0856   K 3.9 04/20/2024 0856   CL 105 04/20/2024 0856   CO2 29 04/20/2024 0856   GLUCOSE 206 (H) 04/20/2024 0856   BUN 16 04/20/2024 0856   CREATININE 1.04 04/20/2024 0856   CALCIUM  9.1 04/20/2024 0856   PROT 6.7 04/20/2024 0856   ALBUMIN  4.1 04/20/2024 0856   AST 12 (L) 04/20/2024 0856   ALT 15 04/20/2024 0856   ALKPHOS 49 04/20/2024 0856   BILITOT 0.4 04/20/2024 0856   GFRNONAA >60 04/20/2024 0856   GFRAA >60 05/11/2020 1300    Lab Results  Component Value Date   WBC 5.7 04/20/2024   NEUTROABS 3.5 04/20/2024   HGB 11.9 (L) 04/20/2024   HCT 35.3 (L) 04/20/2024   MCV 90.3 04/20/2024   PLT 163 04/20/2024     RADIOGRAPHIC STUDIES: CT CHEST ABDOMEN PELVIS W CONTRAST Result Date: 04/17/2024 CLINICAL DATA:  Fever, immunotherapy for bladder cancer * Tracking Code: BO * EXAM: CT CHEST, ABDOMEN, AND PELVIS WITH  CONTRAST TECHNIQUE: Multidetector CT imaging of the chest, abdomen and pelvis was performed following the standard protocol  during bolus administration of intravenous contrast. RADIATION DOSE REDUCTION: This exam was performed according to the departmental dose-optimization program which includes automated exposure control, adjustment of the mA and/or kV according to patient size and/or use of iterative reconstruction technique. CONTRAST:  OMNIPAQUE  IOHEXOL  300 MG/ML  SOLN COMPARISON:  CT abdomen pelvis, 01/01/2016 FINDINGS: CT CHEST FINDINGS Cardiovascular: Right chest port catheter. Aortic atherosclerosis. Normal heart size. Three-vessel coronary artery calcifications. No pericardial effusion. Mediastinum/Nodes: No enlarged mediastinal, hilar, or axillary lymph nodes. Thyroid  gland, trachea, and esophagus demonstrate no significant findings. Lungs/Pleura: Moderate centrilobular and paraseptal emphysema. Mild diffuse bilateral bronchial wall thickening. No pleural effusion or pneumothorax. Musculoskeletal: No chest wall abnormality. No acute osseous findings. CT ABDOMEN PELVIS FINDINGS Hepatobiliary: No solid liver abnormality is seen. No gallstones, gallbladder wall thickening, or biliary dilatation. Pancreas: Unremarkable. No pancreatic ductal dilatation or surrounding inflammatory changes. Spleen: Normal in size without significant abnormality. Adrenals/Urinary Tract: Unchanged, benign left adrenal adenomata requiring no further follow-up or characterization (series 2, image 63). Punctuate nonobstructive calculus of the superior pole of the left kidney. No right-sided calculi, ureteral calculi, or hydronephrosis. Bladder is unremarkable. Stomach/Bowel: Stomach is within normal limits. Appendix appears normal. No evidence of bowel wall thickening, distention, or inflammatory changes. Sigmoid diverticulosis. Vascular/Lymphatic: Aortic atherosclerosis. No enlarged abdominal or pelvic lymph nodes. Reproductive: Prostatomegaly. Other: No abdominal wall hernia or abnormality. No ascites. Musculoskeletal: No acute osseous findings.  IMPRESSION: 1. No acute CT findings of the chest, abdomen, or pelvis to explain fever. 2. No evidence of bladder mass. No evidence of lymphadenopathy or metastatic disease in the chest, abdomen, or pelvis. 3. Prostatomegaly. 4. Punctuate nonobstructive calculus of the superior pole of the left kidney. No right-sided calculi, ureteral calculi, or hydronephrosis. 5. Emphysema and diffuse bilateral bronchial wall thickening. 6. Coronary artery disease. Aortic Atherosclerosis (ICD10-I70.0) and Emphysema (ICD10-J43.9). Electronically Signed   By: Marolyn JONETTA Jaksch M.D.   On: 04/17/2024 22:32

## 2024-04-19 NOTE — Assessment & Plan Note (Signed)
 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 He received Intravesicular gemcitabine  with a total of 2000 mg weekly X6 started in February 2022  -bladder biopsies, bladder washings on 06/24/2022 showed focal urothelial carcinoma in situ (CIS) and cytology was positive for high grade urothelial carcinoma -I recommended intravesical gemcitabine  2000 mg weekly x 6, he started on 08/28/16.  He tolerated first cycle poorly with significant hematuria, and intermittent fever for week after treatment. -Due to his poor tolerance to intravesical gemcitabine , I changed therapy to intravesical mitomycin  40 mg every week for 5 weeks. He tolerated the treatment very well  -His surveillance cystoscopy and biopsy were negative for residual cancer -Given his good response to intravesical mitomycin , and his high risk of recurrence, Dr. Matilda recommend small intravesical therapy.  Patient agreed to proceed, started in in April 2024. Unfortunately he has had recurrent leakage from the intravesical therapy, and not able to continue. -Treatment was switched to Keytruda  on March 18, 2023, he tolerated first cycle well.  Plan to continue every 3 weeks for up to 2 years if no recurrence. -He is overall tolerating treatment well, with mild fatigue. He does have some dry skin, he states all over. He does have some mild, atopic dermatitis noted on the cheek bone areas. He has just started to use Jergen's lotion for diabetics which seems to help the dryness. no significant toxicity, will continue treatment. 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 He received Intravesicular gemcitabine  with a total of  2000 mg weekly X6 started in February 2022  -bladder biopsies, bladder washings on 06/24/2022 showed focal urothelial carcinoma in situ (CIS) and cytology was positive for high grade urothelial carcinoma -I recommended intravesical gemcitabine  2000 mg weekly x 6, he started on 08/28/16.  He tolerated first cycle poorly with significant hematuria, and intermittent fever for week after treatment. -Due to his poor tolerance to intravesical gemcitabine , I changed therapy to intravesical mitomycin  40 mg every week for 5 weeks. He tolerated the treatment very well  -His surveillance cystoscopy and biopsy were negative for residual cancer -Given his good response to intravesical mitomycin , and his high risk of recurrence, Dr. Matilda recommend small intravesical therapy.  Patient agreed to proceed, started in in April 2024. Unfortunately he has had recurrent leakage from the intravesical therapy, and not able to continue. -Treatment was switched to Keytruda  on March 18, 2023, he tolerated first cycle well.  Plan to continue every 3 weeks for up to 2 years if no recurrence. -He is overall tolerating treatment well, with mild fatigue. He does have some dry skin, he states all over. He does have some mild, atopic dermatitis noted on the cheek bone areas. He has just started to use Jergen's lotion for diabetics which seems to help the dryness. no significant toxicity, will continue treatment. -08/12/2023 - today, he presents for Cycle #8 day 1 . -Will follow-up with his urologist for cystoscopy monitoring.

## 2024-04-20 ENCOUNTER — Encounter: Payer: Self-pay | Admitting: Nurse Practitioner

## 2024-04-20 ENCOUNTER — Encounter: Payer: Self-pay | Admitting: Infectious Disease

## 2024-04-20 ENCOUNTER — Inpatient Hospital Stay: Attending: Hematology

## 2024-04-20 ENCOUNTER — Encounter: Payer: Self-pay | Admitting: Hematology

## 2024-04-20 ENCOUNTER — Inpatient Hospital Stay (HOSPITAL_BASED_OUTPATIENT_CLINIC_OR_DEPARTMENT_OTHER): Admitting: Nurse Practitioner

## 2024-04-20 ENCOUNTER — Inpatient Hospital Stay

## 2024-04-20 ENCOUNTER — Other Ambulatory Visit: Payer: Self-pay | Admitting: Internal Medicine

## 2024-04-20 VITALS — BP 132/60 | HR 79 | Temp 97.8°F | Resp 17 | Wt 180.1 lb

## 2024-04-20 DIAGNOSIS — Z79620 Long term (current) use of immunosuppressive biologic: Secondary | ICD-10-CM | POA: Insufficient documentation

## 2024-04-20 DIAGNOSIS — C670 Malignant neoplasm of trigone of bladder: Secondary | ICD-10-CM

## 2024-04-20 DIAGNOSIS — C671 Malignant neoplasm of dome of bladder: Secondary | ICD-10-CM

## 2024-04-20 DIAGNOSIS — Z5112 Encounter for antineoplastic immunotherapy: Secondary | ICD-10-CM | POA: Diagnosis not present

## 2024-04-20 DIAGNOSIS — R509 Fever, unspecified: Secondary | ICD-10-CM | POA: Insufficient documentation

## 2024-04-20 LAB — CMP (CANCER CENTER ONLY)
ALT: 15 U/L (ref 0–44)
AST: 12 U/L — ABNORMAL LOW (ref 15–41)
Albumin: 4.1 g/dL (ref 3.5–5.0)
Alkaline Phosphatase: 49 U/L (ref 38–126)
Anion gap: 4 — ABNORMAL LOW (ref 5–15)
BUN: 16 mg/dL (ref 8–23)
CO2: 29 mmol/L (ref 22–32)
Calcium: 9.1 mg/dL (ref 8.9–10.3)
Chloride: 105 mmol/L (ref 98–111)
Creatinine: 1.04 mg/dL (ref 0.61–1.24)
GFR, Estimated: >60 mL/min (ref >60)
Glucose, Bld: 206 mg/dL — ABNORMAL HIGH (ref 70–99)
Potassium: 3.9 mmol/L (ref 3.5–5.1)
Sodium: 138 mmol/L (ref 135–145)
Total Bilirubin: 0.4 mg/dL (ref 0.0–1.2)
Total Protein: 6.7 g/dL (ref 6.5–8.1)

## 2024-04-20 LAB — CBC WITH DIFFERENTIAL (CANCER CENTER ONLY)
Abs Immature Granulocytes: 0.04 K/uL (ref 0.00–0.07)
Basophils Absolute: 0.1 K/uL (ref 0.0–0.1)
Basophils Relative: 1 %
Eosinophils Absolute: 0.3 K/uL (ref 0.0–0.5)
Eosinophils Relative: 5 %
HCT: 35.3 % — ABNORMAL LOW (ref 39.0–52.0)
Hemoglobin: 11.9 g/dL — ABNORMAL LOW (ref 13.0–17.0)
Immature Granulocytes: 1 %
Lymphocytes Relative: 23 %
Lymphs Abs: 1.3 K/uL (ref 0.7–4.0)
MCH: 30.4 pg (ref 26.0–34.0)
MCHC: 33.7 g/dL (ref 30.0–36.0)
MCV: 90.3 fL (ref 80.0–100.0)
Monocytes Absolute: 0.4 K/uL (ref 0.1–1.0)
Monocytes Relative: 7 %
Neutro Abs: 3.5 K/uL (ref 1.7–7.7)
Neutrophils Relative %: 63 %
Platelet Count: 163 K/uL (ref 150–400)
RBC: 3.91 MIL/uL — ABNORMAL LOW (ref 4.22–5.81)
RDW: 13.2 % (ref 11.5–15.5)
WBC Count: 5.7 K/uL (ref 4.0–10.5)
nRBC: 0 % (ref 0.0–0.2)

## 2024-04-20 LAB — TSH: TSH: 1.86 u[IU]/mL (ref 0.350–4.500)

## 2024-04-20 MED ORDER — SODIUM CHLORIDE 0.9 % IV SOLN
200.0000 mg | Freq: Once | INTRAVENOUS | Status: AC
  Administered 2024-04-20: 200 mg via INTRAVENOUS
  Filled 2024-04-20: qty 8

## 2024-04-20 MED ORDER — SODIUM CHLORIDE 0.9 % IV SOLN: Freq: Once | INTRAVENOUS | Status: AC

## 2024-04-20 NOTE — Progress Notes (Unsigned)
 Reason for Infecitous Disease Consult: FUO  Requesting Physician: Onita Mattock, MD  Subjective:    Patient ID: Beryl JONELLE Dimes, male    DOB: 22-Oct-1940, 83 y.o.   MRN: [id number]  HPI  Past Medical History:  Diagnosis Date   Bladder cancer (HCC)    Blind right eye    BPH (benign prostatic hypertrophy)    Cataracts, both eyes    Depression    DM type 2 (diabetes mellitus, type 2) (HCC)    DOE (dyspnea on exertion)    with heavy exertion   Full dentures    GERD (gastroesophageal reflux disease)    Glaucoma no peripheral or side vision   right eye blind   Glaucoma, right eye    HOH (hard of hearing)    Hyperlipidemia    Hypertension    Hypothyroidism    Local recurrence of cancer of urinary bladder (HCC) urologsit-  dr matilda   1st dx 01-29-2016--  now recurrent 05-31-2016   Lower extremity edema 07/11/2020   right swells more than left goes down at night after sleeping   Lower urinary tract symptoms (LUTS) 2019   Migraine    Peripheral neuropathy    middle finger left hand   Pneumonia 2019   Skin abnormality    base of spine looks like pimple has had since 07-01-2020, no drainage area intact   Wears glasses     Past Surgical History:  Procedure Laterality Date   ANTERIOR CERVICAL DECOMP/DISCECTOMY FUSION  08-15-2000   C5 -- C6   ANTERIOR CERVICAL DECOMP/DISCECTOMY FUSION  03-04-2008   C6 -- C7   areas removed from back at spine  2021 jan and aug 2021   CYSTOSCOPY W/ RETROGRADES Left 05/11/2020   Procedure: Cystoscopy with left retrograde ureteropyelogram, resection of probable bladder tumor and left trigonal/ureteral orifice region, 3 cm in size, fluoroscopic interpretation, left ureteroscopy, placement of 6 French by 26 cm contour double-J stent without tether;  Surgeon: matilda Senior, MD;  Location: [facility];  Service: Urology;  Laterality: Left;   CYSTOSCOPY W/ RETROGRADES Bilateral 06/24/2022   Procedure: CYSTOSCOPY WITH RETROGRADE  PYELOGRAM;  Surgeon: matilda Senior, MD;  Location: [facility];  Service: Urology;  Laterality: Bilateral;  1 HR   CYSTOSCOPY WITH BIOPSY N/A 01/29/2016   Procedure: CYSTOSCOPY WITH BIOPSY;  Surgeon: Senior matilda, MD;  Location: [facility];  Service: Urology;  Laterality: N/A;   CYSTOSCOPY WITH BIOPSY N/A 05/31/2016   Procedure: CYSTOSCOPY WITH BIOPSY;  Surgeon: Senior matilda, MD;  Location: [facility];  Service: Urology;  Laterality: N/A;   CYSTOSCOPY WITH BIOPSY N/A 06/24/2022   Procedure: CYSTOSCOPY WITH BIOPSY;  Surgeon: matilda Senior, MD;  Location: [facility];  Service: Urology;  Laterality: N/A;   EYE SURGERY Right 1998 & 2000   [place]   GLAUCOMA SURGERY  1998 and 2000   IR IMAGING GUIDED PORT INSERTION  03/31/2023   lower back  06/01/2019, 02-10-2020   nose cancer  areas removed  2019, 2021   x 2 / 3/ 2021 small area removed from nose   surgery for sacral fracture  40 yrs ago   TRANSURETHRAL RESECTION OF BLADDER TUMOR N/A 07/17/2020   Procedure: TRANSURETHRAL RESECTION OF BLADDER TUMOR (TURBT);  Surgeon: matilda Senior, MD;  Location: [facility];  Service: Urology;  Laterality: N/A;    Family History  Problem Relation Age of Onset   Heart disease Mother    Heart disease Father  Breast cancer Sister 42   Breast cancer Sister 53       d. 21   Breast cancer Sister 39   Brain cancer Sister        dx and d. 59s   Bladder Cancer Brother 58   Lung cancer Brother        dx after 88   Lung cancer Brother 69   Skin cancer Daughter        melanoma and BCC in 41s   Cancer Nephew        3 nephews with lung, kidney, and unknown ca dx before age 34      Social History   Socioeconomic History   Marital status: Married    Spouse name: Not on file   Number of children: 1   Years of education: Not on file   Highest education level: Not on file  Occupational History   Not on file  Tobacco Use   Smoking status: Former     Current packs/day: 0.00    Average packs/day: 1 pack/day for 50.0 years (50.0 ttl pk-yrs)    Types: Cigarettes    Start date: 06/27/1954    Quit date: 06/27/2004    Years since quitting: 19.8   Smokeless tobacco: Never  Vaping Use   Vaping status: Never Used  Substance and Sexual Activity   Alcohol use: Yes    Alcohol/week: 0.0 standard drinks of alcohol    Comment: occ moonshine garles throat with   Drug use: No   Sexual activity: Not on file  Other Topics Concern   Not on file  Social History Narrative   Not on file   Social Drivers of Health   Financial Resource Strain: Not on file  Food Insecurity: No Food Insecurity (04/20/2024)   Hunger Vital Sign    Worried About Running Out of Food in the Last Year: Never true    Ran Out of Food in the Last Year: Never true  Transportation Needs: No Transportation Needs (04/20/2024)   PRAPARE - Administrator, Civil Service (Medical): No    Lack of Transportation (Non-Medical): No  Physical Activity: Not on file  Stress: Not on file  Social Connections: Not on file    No Known Allergies   Current Outpatient Medications:    acetaminophen  (TYLENOL ) 500 MG tablet, Take 1,000 mg by mouth every 8 (eight) hours as needed for moderate pain., Disp: , Rfl:    amLODipine  (NORVASC ) 10 MG tablet, Take 10 mg by mouth daily., Disp: , Rfl:    atorvastatin  (LIPITOR) 20 MG tablet, Take 20 mg by mouth at bedtime.  (Patient taking differently: Take 10 mg by mouth at bedtime.), Disp: , Rfl:    carteolol (OCUPRESS) 1 % ophthalmic solution, Place 1 drop into the right eye every morning., Disp: , Rfl:    cetirizine (ZYRTEC) 10 MG tablet, Take 10 mg by mouth daily as needed for allergies., Disp: , Rfl:    ciprofloxacin (CIPRO) 500 MG tablet, Take 500 mg by mouth 2 (two) times daily., Disp: , Rfl:    clotrimazole-betamethasone  (LOTRISONE) lotion, Apply 1 Application topically daily as needed (irritation)., Disp: , Rfl:    glimepiride  (AMARYL )  1 MG tablet, Take 1 mg by mouth daily with breakfast., Disp: , Rfl:    ibuprofen (ADVIL) 200 MG tablet, Take 600 mg by mouth every 8 (eight) hours as needed for moderate pain., Disp: , Rfl:    latanoprost (XALATAN) 0.005 % ophthalmic solution, Place  1 drop into the right eye at bedtime., Disp: , Rfl:    levothyroxine  (SYNTHROID , LEVOTHROID) 100 MCG tablet, Take 100 mcg by mouth daily before breakfast. , Disp: , Rfl:    lidocaine -prilocaine  (EMLA ) cream, Apply 1 Application topically as needed., Disp: 30 g, Rfl: 0   losartan  (COZAAR ) 100 MG tablet, Take 100 mg by mouth at bedtime. , Disp: , Rfl:    meloxicam (MOBIC) 15 MG tablet, Take 15 mg by mouth daily., Disp: , Rfl:    Misc Natural Products (COMPLETE PROSTATE HEALTH PO), Take 1 tablet by mouth 2 (two) times daily., Disp: , Rfl:    omeprazole (PRILOSEC) 20 MG capsule, Take 20 mg by mouth daily., Disp: , Rfl:    oxybutynin  (DITROPAN -XL) 5 MG 24 hr tablet, TAKE 1 TABLET (5 MG TOTAL) BY MOUTH ONCE A WEEK. TAKE 2 HOURS BEFORE SCHEDULED CHEMO IN OUR OFFICE, Disp: 90 tablet, Rfl: 1   oxymetazoline (AFRIN) 0.05 % nasal spray, Place 1 spray into both nostrils 2 (two) times daily., Disp: , Rfl:    propranolol  (INDERAL ) 20 MG tablet, TAKE 1 TABLET BY MOUTH TWICE A DAY, Disp: 180 tablet, Rfl: 1   sitaGLIPtin (JANUVIA) 100 MG tablet, Take 100 mg by mouth every morning., Disp: , Rfl:    tamsulosin  (FLOMAX ) 0.4 MG CAPS capsule, Take 0.4 mg by mouth every morning. , Disp: , Rfl:    vitamin B-12 (CYANOCOBALAMIN ) 500 MCG tablet, Take 500 mcg by mouth daily., Disp: , Rfl:    Review of Systems     Objective:   Physical Exam        Assessment & Plan:

## 2024-04-20 NOTE — Patient Instructions (Signed)

## 2024-04-21 ENCOUNTER — Ambulatory Visit (INDEPENDENT_AMBULATORY_CARE_PROVIDER_SITE_OTHER): Admitting: Infectious Disease

## 2024-04-21 ENCOUNTER — Other Ambulatory Visit: Payer: Self-pay

## 2024-04-21 VITALS — BP 107/64 | HR 79 | Temp 98.3°F | Wt 183.8 lb

## 2024-04-21 DIAGNOSIS — Z95828 Presence of other vascular implants and grafts: Secondary | ICD-10-CM

## 2024-04-21 DIAGNOSIS — G3184 Mild cognitive impairment, so stated: Secondary | ICD-10-CM

## 2024-04-21 DIAGNOSIS — M5432 Sciatica, left side: Secondary | ICD-10-CM

## 2024-04-21 DIAGNOSIS — M545 Low back pain, unspecified: Secondary | ICD-10-CM

## 2024-04-21 DIAGNOSIS — Z2989 Encounter for other specified prophylactic measures: Secondary | ICD-10-CM

## 2024-04-21 DIAGNOSIS — Z113 Encounter for screening for infections with a predominantly sexual mode of transmission: Secondary | ICD-10-CM | POA: Diagnosis not present

## 2024-04-21 DIAGNOSIS — C670 Malignant neoplasm of trigone of bladder: Secondary | ICD-10-CM

## 2024-04-21 DIAGNOSIS — B351 Tinea unguium: Secondary | ICD-10-CM

## 2024-04-21 DIAGNOSIS — G8929 Other chronic pain: Secondary | ICD-10-CM

## 2024-04-21 DIAGNOSIS — R509 Fever, unspecified: Secondary | ICD-10-CM

## 2024-04-21 DIAGNOSIS — Z9660 Presence of unspecified orthopedic joint implant: Secondary | ICD-10-CM

## 2024-04-21 DIAGNOSIS — E039 Hypothyroidism, unspecified: Secondary | ICD-10-CM

## 2024-04-21 LAB — T4: T4, Total: 9.7 ug/dL (ref 4.5–12.0)

## 2024-04-21 MED ORDER — TERBINAFINE HCL 250 MG PO TABS: 250.0000 mg | ORAL_TABLET | Freq: Every day | ORAL | 3 refills | Status: AC

## 2024-04-22 ENCOUNTER — Other Ambulatory Visit: Payer: Self-pay

## 2024-04-23 ENCOUNTER — Encounter: Payer: Self-pay | Admitting: Hematology

## 2024-04-23 LAB — QUANTIFERON-TB GOLD PLUS
Mitogen-NIL: 7.43 [IU]/mL
NIL: 0.02 [IU]/mL
QuantiFERON-TB Gold Plus: NEGATIVE
TB1-NIL: 0 [IU]/mL
TB2-NIL: 0 [IU]/mL

## 2024-04-23 LAB — HEPATITIS A ANTIBODY, TOTAL: Hepatitis A AB,Total: REACTIVE — AB

## 2024-04-23 LAB — PROTEIN ELECTROPHORESIS, SERUM
Albumin ELP: 4 g/dL (ref 3.8–4.8)
Alpha 1: 0.3 g/dL (ref 0.2–0.3)
Alpha 2: 0.8 g/dL (ref 0.5–0.9)
Beta 2: 0.4 g/dL (ref 0.2–0.5)
Beta Globulin: 0.4 g/dL (ref 0.4–0.6)
Gamma Globulin: 0.8 g/dL (ref 0.8–1.7)
Total Protein: 6.6 g/dL (ref 6.1–8.1)

## 2024-04-23 LAB — SEDIMENTATION RATE: Sed Rate: 2 mm/h (ref 0–20)

## 2024-04-23 LAB — HIV ANTIBODY (ROUTINE TESTING W REFLEX)
HIV 1&2 Ab, 4th Generation: NONREACTIVE
HIV FINAL INTERPRETATION: NEGATIVE

## 2024-04-23 LAB — EPSTEIN-BARR VIRUS VCA ANTIBODY PANEL
EBV NA IgG: 275 U/mL — ABNORMAL HIGH
EBV VCA IgG: 668 U/mL — ABNORMAL HIGH
EBV VCA IgM: <36 U/mL

## 2024-04-23 LAB — RHEUMATOID FACTOR: Rheumatoid fact SerPl-aCnc: <10 [IU]/mL (ref <14)

## 2024-04-23 LAB — HEPATITIS C AB W/RFL RNA, PCR + GENO: Hepatitis C Ab: NONREACTIVE

## 2024-04-23 LAB — CMV IGM: CMV IgM: 91.8 [AU]/ml — ABNORMAL HIGH

## 2024-04-23 LAB — CYTOMEGALOVIRUS ANTIBODY, IGG: Cytomegalovirus Ab-IgG: >10 U/mL — ABNORMAL HIGH

## 2024-04-23 LAB — ANA: Anti Nuclear Antibody (ANA): NEGATIVE

## 2024-04-23 LAB — HEPATITIS B SURFACE ANTIBODY, QUANTITATIVE: Hep B S AB Quant (Post): <5 m[IU]/mL — ABNORMAL LOW (ref >=10)

## 2024-04-23 LAB — CK: Total CK: 47 U/L (ref 17–247)

## 2024-04-23 LAB — HEPATITIS B SURFACE ANTIGEN: Hepatitis B Surface Ag: NONREACTIVE

## 2024-04-23 LAB — C-REACTIVE PROTEIN: CRP: <3 mg/L (ref <8.0)

## 2024-04-26 ENCOUNTER — Ambulatory Visit: Payer: Self-pay

## 2024-04-30 ENCOUNTER — Ambulatory Visit: Admitting: Podiatry

## 2024-05-04 ENCOUNTER — Encounter: Payer: Self-pay | Admitting: Hematology

## 2024-05-05 ENCOUNTER — Ambulatory Visit (HOSPITAL_COMMUNITY)
Admission: RE | Admit: 2024-05-05 | Discharge: 2024-05-05 | Disposition: A | Discharge status: Home / Self Care | Source: Ambulatory Visit | Attending: Infectious Disease | Admitting: Infectious Disease

## 2024-05-05 DIAGNOSIS — G8929 Other chronic pain: Secondary | ICD-10-CM | POA: Diagnosis not present

## 2024-05-05 DIAGNOSIS — M5416 Radiculopathy, lumbar region: Secondary | ICD-10-CM

## 2024-05-05 DIAGNOSIS — M5441 Lumbago with sciatica, right side: Secondary | ICD-10-CM | POA: Diagnosis not present

## 2024-05-05 DIAGNOSIS — R509 Fever, unspecified: Secondary | ICD-10-CM | POA: Diagnosis not present

## 2024-05-05 DIAGNOSIS — M5442 Lumbago with sciatica, left side: Secondary | ICD-10-CM | POA: Diagnosis not present

## 2024-05-05 DIAGNOSIS — C670 Malignant neoplasm of trigone of bladder: Secondary | ICD-10-CM | POA: Diagnosis not present

## 2024-05-05 MED ORDER — GADOBUTROL 1 MMOL/ML IV SOLN
8.0000 mL | Freq: Once | INTRAVENOUS | Status: AC | PRN
  Administered 2024-05-05: 8 mL via INTRAVENOUS

## 2024-05-10 ENCOUNTER — Other Ambulatory Visit: Payer: Self-pay

## 2024-05-10 ENCOUNTER — Ambulatory Visit: Admitting: Podiatry

## 2024-05-10 ENCOUNTER — Telehealth: Payer: Self-pay | Admitting: Hematology

## 2024-05-10 ENCOUNTER — Other Ambulatory Visit: Payer: Self-pay | Admitting: Hematology

## 2024-05-10 DIAGNOSIS — C671 Malignant neoplasm of dome of bladder: Secondary | ICD-10-CM

## 2024-05-10 NOTE — Telephone Encounter (Signed)
 Scheduled appointments per WQ. Talked with the patients wife and she is aware of all made appointments.

## 2024-05-11 ENCOUNTER — Other Ambulatory Visit: Payer: Self-pay

## 2024-05-12 ENCOUNTER — Other Ambulatory Visit: Payer: Self-pay

## 2024-05-13 NOTE — Assessment & Plan Note (Signed)
 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 He received Intravesicular gemcitabine  with a total of 2000 mg weekly X6 started in February 2022  -bladder biopsies, bladder washings on 06/24/2022 showed focal urothelial carcinoma in situ (CIS) and cytology was positive for high grade urothelial carcinoma -I recommended intravesical gemcitabine  2000 mg weekly x 6, he started on 08/28/16.  He tolerated first cycle poorly with significant hematuria, and intermittent fever for week after treatment. -Due to his poor tolerance to intravesical gemcitabine , I changed therapy to intravesical mitomycin  40 mg every week for 5 weeks. He tolerated the treatment very well  -His surveillance cystoscopy and biopsy were negative for residual cancer -Given his good response to intravesical mitomycin , and his high risk of recurrence, Dr. Matilda recommend small intravesical therapy.  Patient agreed to proceed, started in in April 2024. Unfortunately he has had recurrent leakage from the intravesical therapy, and not able to continue. -Treatment was switched to Keytruda  on March 18, 2023, he tolerated first cycle well.  Plan to continue every 3 weeks for up to 2 years if no recurrence. -He is overall tolerating treatment well, with mild fatigue. He does have some dry skin, he states all over. He does have some mild, atopic dermatitis noted on the cheek bone areas. He has just started to use Jergen's lotion for diabetics which seems to help the dryness. no significant toxicity, will continue treatment. 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 He received Intravesicular gemcitabine  with a total of  2000 mg weekly X6 started in February 2022  -bladder biopsies, bladder washings on 06/24/2022 showed focal urothelial carcinoma in situ (CIS) and cytology was positive for high grade urothelial carcinoma -I recommended intravesical gemcitabine  2000 mg weekly x 6, he started on 08/28/16.  He tolerated first cycle poorly with significant hematuria, and intermittent fever for week after treatment. -Due to his poor tolerance to intravesical gemcitabine , I changed therapy to intravesical mitomycin  40 mg every week for 5 weeks. He tolerated the treatment very well  -His surveillance cystoscopy and biopsy were negative for residual cancer -Given his good response to intravesical mitomycin , and his high risk of recurrence, Dr. Matilda recommend small intravesical therapy.  Patient agreed to proceed, started in in April 2024. Unfortunately he has had recurrent leakage from the intravesical therapy, and not able to continue. -Treatment was switched to Keytruda  on March 18, 2023, he tolerated first cycle well.  Plan to continue every 3 weeks for up to 2 years if no recurrence. -He is overall tolerating treatment well, with mild fatigue. He does have some dry skin, he states all over. He does have some mild, atopic dermatitis noted on the cheek bone areas. He has just started to use Jergen's lotion for diabetics which seems to help the dryness. no significant toxicity, will continue treatment. -08/12/2023 - today, he presents for Cycle #8 day 1 . - Restaging CT CAP done 04/08/2024 -no acute abnormality and no definitive evidence of recurrence or metastatic disease. - 04/20/2024 -proceed with cycle 20 of IV Keytruda  200 mg. --Continue with Keytruda  q. 21 days.

## 2024-05-13 NOTE — Progress Notes (Unsigned)
 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 He received Intravesicular gemcitabine  with a total of 2000 mg weekly X6 started in February 2022  -bladder biopsies, bladder washings on 06/24/2022 showed focal urothelial carcinoma in situ (CIS) and cytology was positive for high grade urothelial carcinoma -I recommended intravesical gemcitabine  2000 mg weekly x 6, he started on 08/28/16.  He tolerated first cycle poorly with significant hematuria, and intermittent fever for week after treatment. -Due to his poor tolerance to intravesical gemcitabine , I changed therapy to intravesical mitomycin  40 mg every week for 5 weeks. He tolerated the treatment very well  -His surveillance cystoscopy and biopsy were negative for residual cancer -Given his good response to intravesical mitomycin , and his high risk of recurrence, Dr. Matilda recommend small intravesical therapy.  Patient agreed to proceed, started in in April 2024. Unfortunately he has had recurrent leakage from the intravesical therapy, and not able to continue. -Treatment was switched to Keytruda  on March 18, 2023, he tolerated first cycle well.  Plan to continue every 3 weeks for up to 2 years if no recurrence. -He is overall tolerating treatment well, with mild fatigue. He does have some dry skin, he states all over. He does have some mild, atopic dermatitis noted on the cheek bone areas. He has just started to use Jergen's lotion  for diabetics which seems to help the dryness. no significant toxicity, will continue treatment. 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 He received Intravesicular gemcitabine  with a total of 2000 mg weekly X6 started in February 2022  -bladder biopsies, bladder washings on 06/24/2022 showed focal urothelial carcinoma in situ (CIS) and cytology was positive for high grade urothelial carcinoma -I recommended intravesical gemcitabine  2000 mg weekly x 6, he started on 08/28/16.  He tolerated first cycle poorly with significant hematuria, and intermittent fever for week after treatment. -Due to his poor tolerance to intravesical gemcitabine , I changed therapy to intravesical mitomycin  40 mg every week for 5 weeks. He tolerated the treatment very well  -His surveillance cystoscopy and biopsy were negative for residual cancer -Given his good response to intravesical mitomycin , and his high risk of recurrence, Dr. Matilda recommend small intravesical therapy.  Patient agreed to proceed, started in in April 2024. Unfortunately he has had recurrent leakage from the intravesical therapy, and not able to continue. -Treatment was switched to Keytruda  on March 18, 2023, he tolerated first cycle well.  Plan to continue every 3 weeks for up to 2 years if no recurrence. -He is overall tolerating treatment well, with mild fatigue. He does have some dry skin, he states all over. He does have some mild, atopic dermatitis noted on the cheek bone areas. He has just started to use Jergen's lotion for diabetics which seems to help the dryness. no significant toxicity, will continue treatment. -08/12/2023 - today, he presents for Cycle #8 day 1 . - Restaging CT  CAP done 04/08/2024 -no acute abnormality and no definitive evidence of recurrence  or metastatic disease. - 04/20/2024 -proceed with cycle 20 of IV Keytruda  200 mg. -- 05/14/2024 -patient continues to tolerate treatment well.  He is now following with infectious disease due to recent history of fever of unknown origin.  Proceed with treatment today and continue with IV Keytruda  every 3 weeks.     Anemia Mild and stable anemia.  Likely effect from treatment with Keytruda .  Denies evidence of bleeding.  No hematuria or blood in the stool.  Will continue to monitor prior to every visit and treat worsening iron deficiency as indicated.  Recent history of fever Recent CT scan done in August 2025 showed no abnormality which could explain fever.  Patient has consulted with infectious disease provider.  He has had a battery of testing and was notified of abnormal results.  He follows up with infectious disease later this month to discuss results and possible treatment, if any.  Nausea Patient reported nausea at home to infusion nurse.  Will send prescription for Zofran  8 mg to his pharmacy.  He may take up to 3 times daily if needed.  Recommend aggressive hydration with water  or fluid with electrolyte supplementation.  Plan Labs reviewed. - Mild and stable anemia. - Mildly elevated glucose at 212.  CMP otherwise unremarkable. Patient continues to tolerate Keytruda  well. Labs and patient presentation are appropriate for treatment today. Proceed with immunotherapy Keytruda  as scheduled. Labs/flush, follow up, and  treatment as scheduled   The patient understands the plans discussed today and is in agreement with them.  He knows to contact our office if he develops concerns prior to his next appointment.  I provided 25 minutes of face-to-face time during this encounter and > 50% was spent counseling as documented under my assessment and plan.    Powell FORBES Lessen, NP  [facility] - A DEPT OF [facility] [address] [place] [address] Dept: [phone] Dept Fax: [phone]   No orders of the defined types were placed in this encounter.     CHIEF COMPLAINT:  CC: Malignant neoplasm of trigone of urinary bladder  Current Treatment: Keytruda  200 mg q. 3 weeks  INTERVAL HISTORY:  Sanjiv is here today for repeat clinical assessment.  He last saw me on 04/14/2024.  He reports continued low back pain with neuropathy into legs and feet.  He has seen his orthopedic provider and recently had MRI of his lumbar spine.  He will need to follow-up with his orthopedic to discuss results and any treatment.  He also reported to his infusion nurse that he has been experiencing nausea at home.  Currently, does not have medication to take for nausea.  He denies chest pain, chest pressure, or shortness of breath. He denies headaches or visual disturbances. He denies abdominal pain,vomiting, or changes in bowel or bladder habits.   He denies fevers or chills. He denies pain. His appetite is good. His weight has been stable.  I have reviewed the past medical history, past surgical history, social history and family history with the patient and they are unchanged from previous note.  ALLERGIES:  has no known allergies.  MEDICATIONS:  Current Outpatient Medications  Medication Sig Dispense Refill   acetaminophen  (TYLENOL ) 500 MG tablet Take 1,000 mg by mouth every 8 (eight) hours as needed for moderate pain.     amLODipine  (NORVASC ) 10 MG  tablet Take 10 mg by mouth daily.     atorvastatin  (LIPITOR) 20 MG tablet Take 20 mg by mouth at bedtime.  (Patient taking differently: Take 10 mg by mouth at bedtime.)     Blood Glucose Monitoring Suppl (ACCU-CHEK AVIVA PLUS) w/Device KIT To check blood sugar vitro three times a day; Duration: 90 days     carteolol (OCUPRESS) 1 % ophthalmic solution Place 1 drop into the right eye every morning.     cetirizine (ZYRTEC) 10 MG tablet Take 10 mg by mouth daily as needed for allergies.      ciprofloxacin (CIPRO) 500 MG tablet Take 500 mg by mouth 2 (two) times daily.     clotrimazole-betamethasone  (LOTRISONE) lotion Apply 1 Application topically daily as needed (irritation).     glimepiride  (AMARYL ) 1 MG tablet Take 1 mg by mouth daily with breakfast.     glucose blood (ACCU-CHEK AVIVA PLUS) test strip To check blood sugars In Vitro two times a day; Duration: 90 days     ibuprofen (ADVIL) 200 MG tablet Take 600 mg by mouth every 8 (eight) hours as needed for moderate pain.     latanoprost (XALATAN) 0.005 % ophthalmic solution Place 1 drop into the right eye at bedtime.     levothyroxine  (SYNTHROID , LEVOTHROID) 100 MCG tablet Take 100 mcg by mouth daily before breakfast.      lidocaine -prilocaine  (EMLA ) cream Apply 1 Application topically as needed. 30 g 0   losartan  (COZAAR ) 100 MG tablet Take 100 mg by mouth at bedtime.      meloxicam (MOBIC) 15 MG tablet Take 15 mg by mouth daily.     Misc Natural Products (COMPLETE PROSTATE HEALTH PO) Take 1 tablet by mouth 2 (two) times daily.     omeprazole (PRILOSEC) 20 MG capsule Take 20 mg by mouth daily.     ondansetron  (ZOFRAN ) 8 MG tablet Take 1 tablet (8 mg total) by mouth every 8 (eight) hours as needed for nausea or vomiting. 30 tablet 1   oxybutynin  (DITROPAN -XL) 5 MG 24 hr tablet TAKE 1 TABLET (5 MG TOTAL) BY MOUTH ONCE A WEEK. TAKE 2 HOURS BEFORE SCHEDULED CHEMO IN OUR OFFICE 90 tablet 1   oxymetazoline (AFRIN) 0.05 % nasal spray Place 1 spray into both nostrils 2 (two) times daily.     propranolol  (INDERAL ) 20 MG tablet TAKE 1 TABLET BY MOUTH TWICE A DAY 180 tablet 1   sitaGLIPtin (JANUVIA) 100 MG tablet Take 100 mg by mouth every morning.     tamsulosin  (FLOMAX ) 0.4 MG CAPS capsule Take 0.4 mg by mouth every morning.      terbinafine  (LAMISIL ) 250 MG tablet Take 1 tablet (250 mg total) by mouth daily. 30 tablet 3   vitamin B-12 (CYANOCOBALAMIN ) 500 MCG tablet Take 500 mcg by mouth daily.     No current facility-administered  medications for this visit.    HISTORY OF PRESENT ILLNESS:   Oncology History Overview Note   Cancer Staging  Bladder cancer [facility]) Staging form: Urinary Bladder, AJCC 8th Edition - Clinical: Stage I (cT1, cN0, cM0) - Signed by Amadeo Windell SAILOR, MD on 09/12/2020 WHO/ISUP grade (low/high): High Grade Histologic grading system: 2 grade system - Pathologic stage from 06/24/2022: Stage 0is (pTis, pN0, cM0) - Signed by Lanny Callander, MD on 08/24/2022 Stage prefix: Initial diagnosis WHO/ISUP grade (low/high): High Grade Histologic grading system: 2 grade system     Bladder cancer (HCC)  09/12/2020 Initial Diagnosis   Bladder cancer (HCC)   09/12/2020  Cancer Staging   Staging form: Urinary Bladder, AJCC 8th Edition - Clinical: Stage I (cT1, cN0, cM0) - Signed by Amadeo Windell SAILOR, MD on 09/12/2020 WHO/ISUP grade (low/high): High Grade Histologic grading system: 2 grade system   06/24/2022 Pathology Results             FINAL MICROSCOPIC DIAGNOSIS:  A. BLADDER, BIOPSY: Focal urothelial carcinoma in situ (CIS)     06/24/2022 Cancer Staging   Staging form: Urinary Bladder, AJCC 8th Edition - Pathologic stage from 06/24/2022: Stage 0is (pTis, pN0, cM0) - Signed by Lanny Callander, MD on 08/24/2022 Stage prefix: Initial diagnosis WHO/ISUP grade (low/high): High Grade Histologic grading system: 2 grade system   08/28/2022 - 08/28/2022 Chemotherapy   Patient is on Treatment Plan : BLADDER Gemcitabine  INTRAVESICAL (2000) q7d     09/12/2022 - 02/26/2023 Chemotherapy   Patient is on Treatment Plan : BLADDER Mitomycin  INTRAVESICAL (40 mg) q7d     10/25/2022 Genetic Testing   Negative [facility] CancerNext-Expanded +RNA Panel.  Report date is 10/25/2022.   The CancerNext-Expanded gene panel offered by [facility] and includes sequencing, rearrangement, and RNA analysis for the following 77 genes: AIP, ALK, APC, ATM, AXIN2, BAP1, BARD1, BLM, BMPR1A, BRCA1, BRCA2, BRIP1, CDC73, CDH1, CDK4, CDKN1B, CDKN2A,  CHEK2, CTNNA1, DICER1, FANCC, FH, FLCN, GALNT12, KIF1B, LZTR1, MAX, MEN1, MET, MLH1, MSH2, MSH3, MSH6, MUTYH, NBN, NF1, NF2, NTHL1, PALB2, PHOX2B, PMS2, POT1, PRKAR1A, PTCH1, PTEN, RAD51C, RAD51D, RB1, RECQL, RET, SDHA, SDHAF2, SDHB, SDHC, SDHD, SMAD4, SMARCA4, SMARCB1, SMARCE1, STK11, SUFU, TMEM127, TP53, TSC1, TSC2, VHL and XRCC2 (sequencing and deletion/duplication); EGFR, EGLN1, HOXB13, KIT, MITF, PDGFRA, POLD1, and POLE (sequencing only); EPCAM and GREM1 (deletion/duplication only).    03/18/2023 -  Chemotherapy   Patient is on Treatment Plan : BLADDER Pembrolizumab  (200) q21d     Malignant neoplasm of trigone of urinary bladder (HCC)  09/12/2020 Initial Diagnosis   Malignant neoplasm of trigone of urinary bladder (HCC)   09/26/2020 - 10/31/2020 Chemotherapy   Patient is on Treatment Plan : BLADDER Gemcitabine  q7d     04/06/2024 Imaging   CT CAP with contrast  IMPRESSION: 1. No acute CT findings of the chest, abdomen, or pelvis to explain fever. 2. No evidence of bladder mass. No evidence of lymphadenopathy or metastatic disease in the chest, abdomen, or pelvis. 3. Prostatomegaly. 4. Punctuate nonobstructive calculus of the superior pole of the left kidney. No right-sided calculi, ureteral calculi, or hydronephrosis. 5. Emphysema and diffuse bilateral bronchial wall thickening. 6. Coronary artery disease.   04/08/2024 Imaging   CT CAP with contrast IMPRESSION: 1. No acute CT findings of the chest, abdomen, or pelvis to explain fever. 2. No evidence of bladder mass. No evidence of lymphadenopathy or metastatic disease in the chest, abdomen, or pelvis. 3. Prostatomegaly. 4. Punctuate nonobstructive calculus of the superior pole of the left kidney. No right-sided calculi, ureteral calculi, or hydronephrosis. 5. Emphysema and diffuse bilateral bronchial wall thickening. 6. Coronary artery disease.       REVIEW OF SYSTEMS:   Constitutional: Denies fevers, chills or abnormal weight  loss Eyes: Denies blurriness of vision Ears, nose, mouth, throat, and face: Denies mucositis or sore throat Respiratory: Denies cough, dyspnea or wheezes Cardiovascular: Denies palpitation, chest discomfort or lower extremity swelling Gastrointestinal: Has been having nausea at home.  Denies vomiting, abdominal pain, or diarrhea. Skin: Denies abnormal skin rashes Lymphatics: Denies new lymphadenopathy or easy bruising Neurological:Denies numbness, tingling or new weaknesses Behavioral/Psych: Mood is stable, no new changes  All other systems  were reviewed with the patient and are negative.   VITALS:   Today's Vitals   05/14/24 0929 05/14/24 1007  BP: 120/76   Pulse: 77   Resp: 17   Temp: 97.9 F (36.6 C)   SpO2: 95%   Weight: 185 lb 8 oz (84.1 kg)   Height: 5' 10 (1.778 m)   PainSc:  7    Body mass index is 26.62 kg/m.   Wt Readings from Last 3 Encounters:  05/14/24 185 lb 8 oz (84.1 kg)  04/21/24 183 lb 12.8 oz (83.4 kg)  04/20/24 180 lb 1.6 oz (81.7 kg)    Body mass index is 26.62 kg/m.  Performance status (ECOG): 1 - Symptomatic but completely ambulatory  PHYSICAL EXAM:   GENERAL:alert, no distress and comfortable SKIN: skin color, texture, turgor are normal, no rashes or significant lesions EYES: normal, Conjunctiva are pink and non-injected, sclera clear OROPHARYNX:no exudate, no erythema and lips, buccal mucosa, and tongue normal  NECK: supple, thyroid  normal size, non-tender, without nodularity LYMPH:  no palpable lymphadenopathy in the cervical, axillary or inguinal LUNGS: clear to auscultation and percussion with normal breathing effort HEART: regular rate & rhythm and no murmurs and no lower extremity edema ABDOMEN:abdomen soft, non-tender and normal bowel sounds Musculoskeletal:no cyanosis of digits and no clubbing  NEURO: alert & oriented x 3 with fluent speech, no focal motor/sensory deficits  LABORATORY DATA:  I have reviewed the data as listed     Component Value Date/Time   NA 139 05/14/2024 0902   K 4.0 05/14/2024 0902   CL 105 05/14/2024 0902   CO2 30 05/14/2024 0902   GLUCOSE 212 (H) 05/14/2024 0902   BUN 14 05/14/2024 0902   CREATININE 1.01 05/14/2024 0902   CALCIUM  9.3 05/14/2024 0902   PROT 6.9 05/14/2024 0902   ALBUMIN  4.0 05/14/2024 0902   AST 13 (L) 05/14/2024 0902   ALT 13 05/14/2024 0902   ALKPHOS 55 05/14/2024 0902   BILITOT 0.4 05/14/2024 0902   GFRNONAA >60 05/14/2024 0902   GFRAA >60 05/11/2020 1300    Lab Results  Component Value Date   WBC 6.5 05/14/2024   NEUTROABS 4.3 05/14/2024   HGB 11.6 (L) 05/14/2024   HCT 34.2 (L) 05/14/2024   MCV 90.5 05/14/2024   PLT 166 05/14/2024    RADIOGRAPHIC STUDIES: MR Lumbar Spine W Wo Contrast Result Date: 05/06/2024 CLINICAL DATA:  Lumbar radiculopathy, bladder cancer, chronic back pain with bilateral leg pain EXAM: MRI LUMBAR SPINE WITHOUT AND WITH CONTRAST TECHNIQUE: Multiplanar and multiecho pulse sequences of the lumbar spine were obtained without and with intravenous contrast. CONTRAST:  8mL GADAVIST  GADOBUTROL  1 MMOL/ML IV SOLN COMPARISON:  None Available. FINDINGS: Bone marrow: No significant abnormality Conus and cauda equina: No significant abnormality Paraspinal tissues: No significant abnormality L1-L2: Normal L2-L3: There is severe degenerative disc disease with a broad-based disc bulge. There is facet arthropathy with facet enlargement and moderate to severe spinal stenosis. There is severe right neural foraminal stenosis L3-L4: Previous PLIF with posterolateral fusion. No spinal stenosis or foraminal stenosis L4-L5: Previous PLIF with posterolateral fusion. No spinal stenosis or foraminal stenosis L5-S1: There is severe degenerative disc disease with a broad-based disc bulge. There is bilateral facet arthropathy. There is severe left and moderate right neural foraminal stenosis IMPRESSION: 1. Previous anterior and posterior fusion at L3-4 and L4-5 2. Moderate  to severe spinal stenosis at L2-3 due to severe degenerative disc disease, disc bulge and facet arthropathy. This is worse compared with the  prior study from 04/02/2022 3. Severe degenerative disc disease with facet arthropathy at L5-S1 with severe left and moderate right neural foraminal stenosis unchanged Electronically Signed   By: Nancyann Burns M.D.   On: 05/06/2024 08:59

## 2024-05-14 ENCOUNTER — Encounter: Payer: Self-pay | Admitting: Hematology

## 2024-05-14 ENCOUNTER — Inpatient Hospital Stay: Attending: Hematology

## 2024-05-14 ENCOUNTER — Encounter: Payer: Self-pay | Admitting: Nurse Practitioner

## 2024-05-14 ENCOUNTER — Inpatient Hospital Stay

## 2024-05-14 ENCOUNTER — Inpatient Hospital Stay (HOSPITAL_BASED_OUTPATIENT_CLINIC_OR_DEPARTMENT_OTHER): Admitting: Nurse Practitioner

## 2024-05-14 VITALS — BP 120/76 | HR 77 | Temp 97.9°F | Resp 17 | Ht 70.0 in | Wt 185.5 lb

## 2024-05-14 DIAGNOSIS — C671 Malignant neoplasm of dome of bladder: Secondary | ICD-10-CM

## 2024-05-14 DIAGNOSIS — C670 Malignant neoplasm of trigone of bladder: Secondary | ICD-10-CM

## 2024-05-14 DIAGNOSIS — Z5112 Encounter for antineoplastic immunotherapy: Secondary | ICD-10-CM | POA: Diagnosis not present

## 2024-05-14 DIAGNOSIS — Z79620 Long term (current) use of immunosuppressive biologic: Secondary | ICD-10-CM | POA: Diagnosis not present

## 2024-05-14 LAB — CBC WITH DIFFERENTIAL (CANCER CENTER ONLY)
Abs Immature Granulocytes: 0.04 K/uL (ref 0.00–0.07)
Basophils Absolute: 0.1 K/uL (ref 0.0–0.1)
Basophils Relative: 1 %
Eosinophils Absolute: 0.3 K/uL (ref 0.0–0.5)
Eosinophils Relative: 4 %
HCT: 34.2 % — ABNORMAL LOW (ref 39.0–52.0)
Hemoglobin: 11.6 g/dL — ABNORMAL LOW (ref 13.0–17.0)
Immature Granulocytes: 1 %
Lymphocytes Relative: 20 %
Lymphs Abs: 1.3 K/uL (ref 0.7–4.0)
MCH: 30.7 pg (ref 26.0–34.0)
MCHC: 33.9 g/dL (ref 30.0–36.0)
MCV: 90.5 fL (ref 80.0–100.0)
Monocytes Absolute: 0.5 K/uL (ref 0.1–1.0)
Monocytes Relative: 8 %
Neutro Abs: 4.3 K/uL (ref 1.7–7.7)
Neutrophils Relative %: 66 %
Platelet Count: 166 K/uL (ref 150–400)
RBC: 3.78 MIL/uL — ABNORMAL LOW (ref 4.22–5.81)
RDW: 13.2 % (ref 11.5–15.5)
WBC Count: 6.5 K/uL (ref 4.0–10.5)
nRBC: 0 % (ref 0.0–0.2)

## 2024-05-14 LAB — CMP (CANCER CENTER ONLY)
ALT: 13 U/L (ref 0–44)
AST: 13 U/L — ABNORMAL LOW (ref 15–41)
Albumin: 4 g/dL (ref 3.5–5.0)
Alkaline Phosphatase: 55 U/L (ref 38–126)
Anion gap: 4 — ABNORMAL LOW (ref 5–15)
BUN: 14 mg/dL (ref 8–23)
CO2: 30 mmol/L (ref 22–32)
Calcium: 9.3 mg/dL (ref 8.9–10.3)
Chloride: 105 mmol/L (ref 98–111)
Creatinine: 1.01 mg/dL (ref 0.61–1.24)
GFR, Estimated: >60 mL/min (ref >60)
Glucose, Bld: 212 mg/dL — ABNORMAL HIGH (ref 70–99)
Potassium: 4 mmol/L (ref 3.5–5.1)
Sodium: 139 mmol/L (ref 135–145)
Total Bilirubin: 0.4 mg/dL (ref 0.0–1.2)
Total Protein: 6.9 g/dL (ref 6.5–8.1)

## 2024-05-14 LAB — TSH: TSH: 1.87 u[IU]/mL (ref 0.350–4.500)

## 2024-05-14 MED ORDER — ONDANSETRON HCL 8 MG PO TABS: 8.0000 mg | ORAL_TABLET | Freq: Three times a day (TID) | ORAL | 1 refills | Status: AC | PRN

## 2024-05-14 MED ORDER — SODIUM CHLORIDE 0.9 % IV SOLN
200.0000 mg | Freq: Once | INTRAVENOUS | Status: AC
  Administered 2024-05-14: 200 mg via INTRAVENOUS
  Filled 2024-05-14: qty 8

## 2024-05-14 MED ORDER — SODIUM CHLORIDE 0.9 % IV SOLN: Freq: Once | INTRAVENOUS | Status: AC

## 2024-05-14 NOTE — Patient Instructions (Signed)

## 2024-05-15 LAB — T4: T4, Total: 9.2 ug/dL (ref 4.5–12.0)

## 2024-05-23 NOTE — Progress Notes (Unsigned)
 Subjective:  Chief complaint: follow-up for FUO   Patient ID: Keith [name], male    DOB: 07-07-1941, 83 y.o.   MRN: [id number]  HPI  Past Medical History:  Diagnosis Date   Bladder cancer (HCC)    Blind right eye    BPH (benign prostatic hypertrophy)    Cataracts, both eyes    Depression    DM type 2 (diabetes mellitus, type 2) (HCC)    DOE (dyspnea on exertion)    with heavy exertion   Full dentures    FUO (fever of unknown origin) 04/20/2024   GERD (gastroesophageal reflux disease)    Glaucoma no peripheral or side vision   right eye blind   Glaucoma, right eye    HOH (hard of hearing)    Hyperlipidemia    Hypertension    Hypothyroidism    Local recurrence of cancer of urinary bladder (HCC) urologsit-  dr matilda   1st dx 01-29-2016--  now recurrent 05-31-2016   Lower extremity edema 07/11/2020   right swells more than left goes down at night after sleeping   Lower urinary tract symptoms (LUTS) 2019   Migraine    Peripheral neuropathy    middle finger left hand   Pneumonia 2019   Skin abnormality    base of spine looks like pimple has had since 07-01-2020, no drainage area intact   Wears glasses     Past Surgical History:  Procedure Laterality Date   ANTERIOR CERVICAL DECOMP/DISCECTOMY FUSION  08-15-2000   C5 -- C6   ANTERIOR CERVICAL DECOMP/DISCECTOMY FUSION  03-04-2008   C6 -- C7   areas removed from back at spine  2021 jan and aug 2021   CYSTOSCOPY W/ RETROGRADES Left 05/11/2020   Procedure: Cystoscopy with left retrograde ureteropyelogram, resection of probable bladder tumor and left trigonal/ureteral orifice region, 3 cm in size, fluoroscopic interpretation, left ureteroscopy, placement of 6 French by 26 cm contour double-J stent without tether;  Surgeon: matilda Senior, MD;  Location: [facility];  Service: Urology;  Laterality: Left;   CYSTOSCOPY W/ RETROGRADES Bilateral 06/24/2022   Procedure: CYSTOSCOPY WITH RETROGRADE  PYELOGRAM;  Surgeon: matilda Senior, MD;  Location: [facility];  Service: Urology;  Laterality: Bilateral;  1 HR   CYSTOSCOPY WITH BIOPSY N/A 01/29/2016   Procedure: CYSTOSCOPY WITH BIOPSY;  Surgeon: Senior matilda, MD;  Location: [facility];  Service: Urology;  Laterality: N/A;   CYSTOSCOPY WITH BIOPSY N/A 05/31/2016   Procedure: CYSTOSCOPY WITH BIOPSY;  Surgeon: Senior matilda, MD;  Location: [facility];  Service: Urology;  Laterality: N/A;   CYSTOSCOPY WITH BIOPSY N/A 06/24/2022   Procedure: CYSTOSCOPY WITH BIOPSY;  Surgeon: matilda Senior, MD;  Location: [facility];  Service: Urology;  Laterality: N/A;   EYE SURGERY Right 1998 & 2000   [place]   GLAUCOMA SURGERY  1998 and 2000   IR IMAGING GUIDED PORT INSERTION  03/31/2023   lower back  06/01/2019, 02-10-2020   nose cancer  areas removed  2019, 2021   x 2 / 3/ 2021 small area removed from nose   surgery for sacral fracture  40 yrs ago   TRANSURETHRAL RESECTION OF BLADDER TUMOR N/A 07/17/2020   Procedure: TRANSURETHRAL RESECTION OF BLADDER TUMOR (TURBT);  Surgeon: matilda Senior, MD;  Location: [facility];  Service: Urology;  Laterality: N/A;    Family History  Problem Relation Age of Onset   Heart disease Mother    Heart disease Father  Breast cancer Sister 27   Breast cancer Sister 59       d. 61   Breast cancer Sister 58   Brain cancer Sister        dx and d. 70s   Bladder Cancer Brother 77   Lung cancer Brother        dx after 52   Lung cancer Brother 47   Skin cancer Daughter        melanoma and BCC in 28s   Cancer Nephew        3 nephews with lung, kidney, and unknown ca dx before age 67      Social History   Socioeconomic History   Marital status: Married    Spouse name: Not on file   Number of children: 1   Years of education: Not on file   Highest education level: Not on file  Occupational History   Not on file  Tobacco Use   Smoking status: Former     Current packs/day: 0.00    Average packs/day: 1 pack/day for 50.0 years (50.0 ttl pk-yrs)    Types: Cigarettes    Start date: 06/27/1954    Quit date: 06/27/2004    Years since quitting: 19.9   Smokeless tobacco: Never  Vaping Use   Vaping status: Never Used  Substance and Sexual Activity   Alcohol use: Yes    Alcohol/week: 0.0 standard drinks of alcohol    Comment: occ moonshine garles throat with   Drug use: No   Sexual activity: Not on file  Other Topics Concern   Not on file  Social History Narrative   Not on file   Social Drivers of Health   Financial Resource Strain: Not on file  Food Insecurity: No Food Insecurity (04/20/2024)   Hunger Vital Sign    Worried About Running Out of Food in the Last Year: Never true    Ran Out of Food in the Last Year: Never true  Transportation Needs: No Transportation Needs (04/20/2024)   PRAPARE - Administrator, Civil Service (Medical): No    Lack of Transportation (Non-Medical): No  Physical Activity: Not on file  Stress: Not on file  Social Connections: Not on file    No Known Allergies   Current Outpatient Medications:    acetaminophen  (TYLENOL ) 500 MG tablet, Take 1,000 mg by mouth every 8 (eight) hours as needed for moderate pain., Disp: , Rfl:    amLODipine  (NORVASC ) 10 MG tablet, Take 10 mg by mouth daily., Disp: , Rfl:    atorvastatin  (LIPITOR) 20 MG tablet, Take 20 mg by mouth at bedtime.  (Patient taking differently: Take 10 mg by mouth at bedtime.), Disp: , Rfl:    Blood Glucose Monitoring Suppl (ACCU-CHEK AVIVA PLUS) w/Device KIT, To check blood sugar vitro three times a day; Duration: 90 days, Disp: , Rfl:    carteolol (OCUPRESS) 1 % ophthalmic solution, Place 1 drop into the right eye every morning., Disp: , Rfl:    cetirizine (ZYRTEC) 10 MG tablet, Take 10 mg by mouth daily as needed for allergies., Disp: , Rfl:    ciprofloxacin (CIPRO) 500 MG tablet, Take 500 mg by mouth 2 (two) times daily., Disp: , Rfl:     clotrimazole-betamethasone  (LOTRISONE) lotion, Apply 1 Application topically daily as needed (irritation)., Disp: , Rfl:    glimepiride  (AMARYL ) 1 MG tablet, Take 1 mg by mouth daily with breakfast., Disp: , Rfl:    glucose blood (ACCU-CHEK AVIVA PLUS)  test strip, To check blood sugars In Vitro two times a day; Duration: 90 days, Disp: , Rfl:    ibuprofen (ADVIL) 200 MG tablet, Take 600 mg by mouth every 8 (eight) hours as needed for moderate pain., Disp: , Rfl:    latanoprost (XALATAN) 0.005 % ophthalmic solution, Place 1 drop into the right eye at bedtime., Disp: , Rfl:    levothyroxine  (SYNTHROID , LEVOTHROID) 100 MCG tablet, Take 100 mcg by mouth daily before breakfast. , Disp: , Rfl:    lidocaine -prilocaine  (EMLA ) cream, Apply 1 Application topically as needed., Disp: 30 g, Rfl: 0   losartan  (COZAAR ) 100 MG tablet, Take 100 mg by mouth at bedtime. , Disp: , Rfl:    meloxicam (MOBIC) 15 MG tablet, Take 15 mg by mouth daily., Disp: , Rfl:    Misc Natural Products (COMPLETE PROSTATE HEALTH PO), Take 1 tablet by mouth 2 (two) times daily., Disp: , Rfl:    omeprazole (PRILOSEC) 20 MG capsule, Take 20 mg by mouth daily., Disp: , Rfl:    ondansetron  (ZOFRAN ) 8 MG tablet, Take 1 tablet (8 mg total) by mouth every 8 (eight) hours as needed for nausea or vomiting., Disp: 30 tablet, Rfl: 1   oxybutynin  (DITROPAN -XL) 5 MG 24 hr tablet, TAKE 1 TABLET (5 MG TOTAL) BY MOUTH ONCE A WEEK. TAKE 2 HOURS BEFORE SCHEDULED CHEMO IN OUR OFFICE, Disp: 90 tablet, Rfl: 1   oxymetazoline (AFRIN) 0.05 % nasal spray, Place 1 spray into both nostrils 2 (two) times daily., Disp: , Rfl:    propranolol  (INDERAL ) 20 MG tablet, TAKE 1 TABLET BY MOUTH TWICE A DAY, Disp: 180 tablet, Rfl: 1   sitaGLIPtin (JANUVIA) 100 MG tablet, Take 100 mg by mouth every morning., Disp: , Rfl:    tamsulosin  (FLOMAX ) 0.4 MG CAPS capsule, Take 0.4 mg by mouth every morning. , Disp: , Rfl:    terbinafine  (LAMISIL ) 250 MG tablet, Take 1 tablet (250  mg total) by mouth daily., Disp: 30 tablet, Rfl: 3   vitamin B-12 (CYANOCOBALAMIN ) 500 MCG tablet, Take 500 mcg by mouth daily., Disp: , Rfl:    Review of Systems     Objective:   Physical Exam        Assessment & Plan:

## 2024-05-24 ENCOUNTER — Ambulatory Visit: Payer: Self-pay | Admitting: Infectious Disease

## 2024-05-24 ENCOUNTER — Encounter: Payer: Self-pay | Admitting: Infectious Disease

## 2024-05-24 ENCOUNTER — Other Ambulatory Visit: Payer: Self-pay

## 2024-05-24 VITALS — BP 113/56 | HR 59 | Temp 98.2°F | Wt 184.0 lb

## 2024-05-24 DIAGNOSIS — Z23 Encounter for immunization: Secondary | ICD-10-CM

## 2024-05-24 DIAGNOSIS — R509 Fever, unspecified: Secondary | ICD-10-CM

## 2024-05-24 DIAGNOSIS — M48062 Spinal stenosis, lumbar region with neurogenic claudication: Secondary | ICD-10-CM | POA: Diagnosis not present

## 2024-05-24 DIAGNOSIS — C679 Malignant neoplasm of bladder, unspecified: Secondary | ICD-10-CM | POA: Diagnosis not present

## 2024-05-24 DIAGNOSIS — C670 Malignant neoplasm of trigone of bladder: Secondary | ICD-10-CM

## 2024-05-24 DIAGNOSIS — M48061 Spinal stenosis, lumbar region without neurogenic claudication: Secondary | ICD-10-CM | POA: Insufficient documentation

## 2024-05-24 NOTE — Progress Notes (Signed)
 Per MD scheduled echo appt for 11/3 at 11 AM. No Prior authorization needed for PA.  Lorenda CHRISTELLA Code, RMA

## 2024-05-29 LAB — CULTURE, BLOOD (SINGLE)
MICRO NUMBER:: 17091815
MICRO NUMBER:: 17091816
Result:: NO GROWTH
Result:: NO GROWTH
SPECIMEN QUALITY:: ADEQUATE
SPECIMEN QUALITY:: ADEQUATE

## 2024-05-29 LAB — CMV (CYTOMEGALOVIRUS) DNA ULTRAQUANT, PCR
CMV DNA, QN PCR: NOT DETECTED [IU]/mL
CMV DNA, QN PCR: NOT DETECTED {Log_IU}/mL

## 2024-05-31 ENCOUNTER — Ambulatory Visit: Admitting: Nurse Practitioner

## 2024-05-31 ENCOUNTER — Other Ambulatory Visit

## 2024-05-31 ENCOUNTER — Ambulatory Visit

## 2024-06-03 ENCOUNTER — Inpatient Hospital Stay (HOSPITAL_BASED_OUTPATIENT_CLINIC_OR_DEPARTMENT_OTHER): Admitting: Hematology

## 2024-06-03 ENCOUNTER — Inpatient Hospital Stay

## 2024-06-03 VITALS — BP 110/50 | HR 84 | Temp 98.3°F | Ht 70.0 in | Wt 187.4 lb

## 2024-06-03 DIAGNOSIS — Z5112 Encounter for antineoplastic immunotherapy: Secondary | ICD-10-CM | POA: Diagnosis not present

## 2024-06-03 DIAGNOSIS — C671 Malignant neoplasm of dome of bladder: Secondary | ICD-10-CM | POA: Diagnosis not present

## 2024-06-03 DIAGNOSIS — R5383 Other fatigue: Secondary | ICD-10-CM

## 2024-06-03 DIAGNOSIS — C679 Malignant neoplasm of bladder, unspecified: Secondary | ICD-10-CM

## 2024-06-03 LAB — CBC WITH DIFFERENTIAL (CANCER CENTER ONLY)
Abs Immature Granulocytes: 0.03 K/uL (ref 0.00–0.07)
Basophils Absolute: 0.1 K/uL (ref 0.0–0.1)
Basophils Relative: 1 %
Eosinophils Absolute: 0.3 K/uL (ref 0.0–0.5)
Eosinophils Relative: 5 %
HCT: 34.3 % — ABNORMAL LOW (ref 39.0–52.0)
Hemoglobin: 11.8 g/dL — ABNORMAL LOW (ref 13.0–17.0)
Immature Granulocytes: 1 %
Lymphocytes Relative: 20 %
Lymphs Abs: 1.1 K/uL (ref 0.7–4.0)
MCH: 30.7 pg (ref 26.0–34.0)
MCHC: 34.4 g/dL (ref 30.0–36.0)
MCV: 89.3 fL (ref 80.0–100.0)
Monocytes Absolute: 0.5 K/uL (ref 0.1–1.0)
Monocytes Relative: 9 %
Neutro Abs: 3.6 K/uL (ref 1.7–7.7)
Neutrophils Relative %: 64 %
Platelet Count: 167 K/uL (ref 150–400)
RBC: 3.84 MIL/uL — ABNORMAL LOW (ref 4.22–5.81)
RDW: 13.1 % (ref 11.5–15.5)
WBC Count: 5.6 K/uL (ref 4.0–10.5)
nRBC: 0 % (ref 0.0–0.2)

## 2024-06-03 LAB — CMP (CANCER CENTER ONLY)
ALT: 11 U/L (ref 0–44)
AST: 13 U/L — ABNORMAL LOW (ref 15–41)
Albumin: 4.1 g/dL (ref 3.5–5.0)
Alkaline Phosphatase: 55 U/L (ref 38–126)
Anion gap: 4 — ABNORMAL LOW (ref 5–15)
BUN: 15 mg/dL (ref 8–23)
CO2: 30 mmol/L (ref 22–32)
Calcium: 9.1 mg/dL (ref 8.9–10.3)
Chloride: 106 mmol/L (ref 98–111)
Creatinine: 0.91 mg/dL (ref 0.61–1.24)
GFR, Estimated: >60 mL/min (ref >60)
Glucose, Bld: 198 mg/dL — ABNORMAL HIGH (ref 70–99)
Potassium: 3.9 mmol/L (ref 3.5–5.1)
Sodium: 140 mmol/L (ref 135–145)
Total Bilirubin: 0.4 mg/dL (ref 0.0–1.2)
Total Protein: 6.9 g/dL (ref 6.5–8.1)

## 2024-06-03 LAB — T4, FREE: Free T4: 1.1 ng/dL (ref 0.61–1.12)

## 2024-06-03 LAB — TSH: TSH: 1.06 u[IU]/mL (ref 0.350–4.500)

## 2024-06-03 MED ORDER — SODIUM CHLORIDE 0.9 % IV SOLN
200.0000 mg | Freq: Once | INTRAVENOUS | Status: AC
  Administered 2024-06-03: 200 mg via INTRAVENOUS
  Filled 2024-06-03: qty 8

## 2024-06-03 MED ORDER — SODIUM CHLORIDE 0.9 % IV SOLN: Freq: Once | INTRAVENOUS | Status: AC

## 2024-06-03 NOTE — Progress Notes (Signed)
 [facility]   [phone] [phone]   Clinic Follow up Note   Patient Care Team: Valentin Skates, DO as PCP - General (Internal Medicine) Lanny Callander, MD as Attending Physician (Hematology and Oncology) Matilda Senior, MD as Consulting Physician (Urology)  Date of Service:  06/03/2024  CHIEF COMPLAINT: f/u of bladder cancer  CURRENT THERAPY:  Keytruda  every 3 weeks  Oncology History   Bladder cancer (HCC) -Stage I, no muscular invasive, initially diagnosed in 2017  -He was found to have high-grade urothelial carcinoma without muscle invasion and developed and subsequently BCG refractory disease in December 2021. He received Intravesicular gemcitabine  with a total of 2000 mg weekly X6 started in February 2022  -bladder biopsies, bladder washings on 06/24/2022 showed focal urothelial carcinoma in situ (CIS) and cytology was positive for high grade urothelial carcinoma -I recommended intravesical gemcitabine  2000 mg weekly x 6, he started on 08/28/16.  He tolerated first cycle poorly with significant hematuria, and intermittent fever for week after treatment. -Due to his poor tolerance to intravesical gemcitabine , I changed therapy to intravesical mitomycin  40 mg every week for 5 weeks. He tolerated the treatment very well  -His surveillance cystoscopy and biopsy were negative for residual cancer -Given his good response to intravesical mitomycin , and his high risk of recurrence, Dr. Matilda recommend small intravesical therapy.  Patient agrees to proceed, started in in April 2024. Unfortunately he has had recurrent leakage from the intravesical therapy, and not able to continue. -Treatment was switched to Keytruda  on March 18, 2023, he has been tolerating well overall.  Plan to continue every 3 weeks for up to 2 years if no recurrence  Assessment & Plan Malignant neoplasm of bladder Scheduled cystoscopy to assess for recurrence. Treatment will  continue until August next year if no recurrence is detected. - Proceed with cystoscopy on October 27 or 28. - Continue current treatment regimen until August next year if cystoscopy is clear.  Potential skin cancer lesion Current lesion requires evaluation to rule out malignancy. Previous skin cancers have been treated successfully. - Evaluate skin lesion to rule out skin cancer.  Intermittent low grade fever Intermittent fever with facial flushing and malaise. Fever ranges from 99 to 100F. Extensive testing by infectious disease specialist Dr. Fleeta Aris has not identified an infectious cause. Further tests, including a cardiac evaluation, are pending. - Complete pending cardiac evaluation on Monday. - Monitor temperature and symptoms.  Onychomycosis Being treated with terbinafine  (Lamisil ) as recommended by the podiatrist. The fungal infection may be contributing to other symptoms. - Continue terbinafine  (Lamisil ) for onychomycosis.  Plan - I reviewed his recent workup by ID and orthopedics - Lab reviewed, adequate for treatment, will proceed Keytruda  today and continue every 3 weeks - He is scheduled to have repeated a cystoscopy by his urologist in the next few weeks - Follow-up in 3 weeks    SUMMARY OF ONCOLOGIC HISTORY: Oncology History Overview Note   Cancer Staging  Bladder cancer [facility]) Staging form: Urinary Bladder, AJCC 8th Edition - Clinical: Stage I (cT1, cN0, cM0) - Signed by Amadeo Windell SAILOR, MD on 09/12/2020 WHO/ISUP grade (low/high): High Grade Histologic grading system: 2 grade system - Pathologic stage from 06/24/2022: Stage 0is (pTis, pN0, cM0) - Signed by Lanny Callander, MD on 08/24/2022 Stage prefix: Initial diagnosis WHO/ISUP grade (low/high): High Grade Histologic grading system: 2 grade system     Bladder cancer (HCC)  09/12/2020 Initial Diagnosis   Bladder cancer (HCC)   09/12/2020 Cancer Staging  Staging form: Urinary Bladder, AJCC 8th Edition - Clinical:  Stage I (cT1, cN0, cM0) - Signed by Amadeo Windell SAILOR, MD on 09/12/2020 WHO/ISUP grade (low/high): High Grade Histologic grading system: 2 grade system   06/24/2022 Pathology Results             FINAL MICROSCOPIC DIAGNOSIS:  A. BLADDER, BIOPSY: Focal urothelial carcinoma in situ (CIS)     06/24/2022 Cancer Staging   Staging form: Urinary Bladder, AJCC 8th Edition - Pathologic stage from 06/24/2022: Stage 0is (pTis, pN0, cM0) - Signed by Lanny Callander, MD on 08/24/2022 Stage prefix: Initial diagnosis WHO/ISUP grade (low/high): High Grade Histologic grading system: 2 grade system   08/28/2022 - 08/28/2022 Chemotherapy   Patient is on Treatment Plan : BLADDER Gemcitabine  INTRAVESICAL (2000) q7d     09/12/2022 - 02/26/2023 Chemotherapy   Patient is on Treatment Plan : BLADDER Mitomycin  INTRAVESICAL (40 mg) q7d     10/25/2022 Genetic Testing   Negative [facility] CancerNext-Expanded +RNA Panel.  Report date is 10/25/2022.   The CancerNext-Expanded gene panel offered by [facility] and includes sequencing, rearrangement, and RNA analysis for the following 77 genes: AIP, ALK, APC, ATM, AXIN2, BAP1, BARD1, BLM, BMPR1A, BRCA1, BRCA2, BRIP1, CDC73, CDH1, CDK4, CDKN1B, CDKN2A, CHEK2, CTNNA1, DICER1, FANCC, FH, FLCN, GALNT12, KIF1B, LZTR1, MAX, MEN1, MET, MLH1, MSH2, MSH3, MSH6, MUTYH, NBN, NF1, NF2, NTHL1, PALB2, PHOX2B, PMS2, POT1, PRKAR1A, PTCH1, PTEN, RAD51C, RAD51D, RB1, RECQL, RET, SDHA, SDHAF2, SDHB, SDHC, SDHD, SMAD4, SMARCA4, SMARCB1, SMARCE1, STK11, SUFU, TMEM127, TP53, TSC1, TSC2, VHL and XRCC2 (sequencing and deletion/duplication); EGFR, EGLN1, HOXB13, KIT, MITF, PDGFRA, POLD1, and POLE (sequencing only); EPCAM and GREM1 (deletion/duplication only).    03/18/2023 -  Chemotherapy   Patient is on Treatment Plan : BLADDER Pembrolizumab  (200) q21d     Malignant neoplasm of trigone of urinary bladder (HCC)  09/12/2020 Initial Diagnosis   Malignant neoplasm of trigone of urinary bladder (HCC)    09/26/2020 - 10/31/2020 Chemotherapy   Patient is on Treatment Plan : BLADDER Gemcitabine  q7d     04/06/2024 Imaging   CT CAP with contrast  IMPRESSION: 1. No acute CT findings of the chest, abdomen, or pelvis to explain fever. 2. No evidence of bladder mass. No evidence of lymphadenopathy or metastatic disease in the chest, abdomen, or pelvis. 3. Prostatomegaly. 4. Punctuate nonobstructive calculus of the superior pole of the left kidney. No right-sided calculi, ureteral calculi, or hydronephrosis. 5. Emphysema and diffuse bilateral bronchial wall thickening. 6. Coronary artery disease.   04/08/2024 Imaging   CT CAP with contrast IMPRESSION: 1. No acute CT findings of the chest, abdomen, or pelvis to explain fever. 2. No evidence of bladder mass. No evidence of lymphadenopathy or metastatic disease in the chest, abdomen, or pelvis. 3. Prostatomegaly. 4. Punctuate nonobstructive calculus of the superior pole of the left kidney. No right-sided calculi, ureteral calculi, or hydronephrosis. 5. Emphysema and diffuse bilateral bronchial wall thickening. 6. Coronary artery disease.      Discussed the use of AI scribe software for clinical note transcription with the patient, who gave verbal consent to proceed.  History of Present Illness Keith [name] is an 83 year old male with blood cancer who presents for follow-up.  He experiences decreased energy levels, rating it as a four out of ten, and is less active, no longer walking to the workshop as frequently. Despite reduced activity, he has gained weight, currently at 187 pounds, and maintains a good appetite. He uses a stationary bike to maintain leg strength.  He experiences intermittent fevers with temperatures ranging from 99 to 100 degrees Fahrenheit. He feels red-faced and unwell about an hour and a half after the onset of fever and feels cold at night despite having a fever. His blood pressure remains stable during these  episodes.  He experiences back pain that starts in the back, comes around to the front, and radiates down his legs. No other joint pain is present. He is currently taking terbinafine  for toenail fungus, having previously used vinegar and salt.     All other systems were reviewed with the patient and are negative.  MEDICAL HISTORY:  Past Medical History:  Diagnosis Date   Bladder cancer (HCC)    Blind right eye    BPH (benign prostatic hypertrophy)    Cataracts, both eyes    Depression    DM type 2 (diabetes mellitus, type 2) (HCC)    DOE (dyspnea on exertion)    with heavy exertion   Full dentures    FUO (fever of unknown origin) 04/20/2024   GERD (gastroesophageal reflux disease)    Glaucoma no peripheral or side vision   right eye blind   Glaucoma, right eye    HOH (hard of hearing)    Hyperlipidemia    Hypertension    Hypothyroidism    Local recurrence of cancer of urinary bladder (HCC) urologsit-  dr matilda   1st dx 01-29-2016--  now recurrent 05-31-2016   Lower extremity edema 07/11/2020   right swells more than left goes down at night after sleeping   Lower urinary tract symptoms (LUTS) 2019   Lumbar stenosis 05/24/2024   Migraine    Peripheral neuropathy    middle finger left hand   Pneumonia 2019   Skin abnormality    base of spine looks like pimple has had since 07-01-2020, no drainage area intact   Wears glasses     SURGICAL HISTORY: Past Surgical History:  Procedure Laterality Date   ANTERIOR CERVICAL DECOMP/DISCECTOMY FUSION  08-15-2000   C5 -- C6   ANTERIOR CERVICAL DECOMP/DISCECTOMY FUSION  03-04-2008   C6 -- C7   areas removed from back at spine  2021 jan and aug 2021   CYSTOSCOPY W/ RETROGRADES Left 05/11/2020   Procedure: Cystoscopy with left retrograde ureteropyelogram, resection of probable bladder tumor and left trigonal/ureteral orifice region, 3 cm in size, fluoroscopic interpretation, left ureteroscopy, placement of 6 French by 26 cm  contour double-J stent without tether;  Surgeon: matilda Senior, MD;  Location: [facility];  Service: Urology;  Laterality: Left;   CYSTOSCOPY W/ RETROGRADES Bilateral 06/24/2022   Procedure: CYSTOSCOPY WITH RETROGRADE PYELOGRAM;  Surgeon: matilda Senior, MD;  Location: [facility];  Service: Urology;  Laterality: Bilateral;  1 HR   CYSTOSCOPY WITH BIOPSY N/A 01/29/2016   Procedure: CYSTOSCOPY WITH BIOPSY;  Surgeon: Senior matilda, MD;  Location: [facility];  Service: Urology;  Laterality: N/A;   CYSTOSCOPY WITH BIOPSY N/A 05/31/2016   Procedure: CYSTOSCOPY WITH BIOPSY;  Surgeon: Senior matilda, MD;  Location: [facility];  Service: Urology;  Laterality: N/A;   CYSTOSCOPY WITH BIOPSY N/A 06/24/2022   Procedure: CYSTOSCOPY WITH BIOPSY;  Surgeon: matilda Senior, MD;  Location: [facility];  Service: Urology;  Laterality: N/A;   EYE SURGERY Right 1998 & 2000   [place]   GLAUCOMA SURGERY  1998 and 2000   IR IMAGING GUIDED PORT INSERTION  03/31/2023   lower back  06/01/2019, 02-10-2020   nose cancer  areas removed  2019, 2021   x 2 / 3/ 2021 small area removed from nose   surgery for sacral fracture  40 yrs ago   TRANSURETHRAL RESECTION OF BLADDER TUMOR N/A 07/17/2020   Procedure: TRANSURETHRAL RESECTION OF BLADDER TUMOR (TURBT);  Surgeon: Matilda Senior, MD;  Location: [facility];  Service: Urology;  Laterality: N/A;    I have reviewed the social history and family history with the patient and they are unchanged from previous note.  ALLERGIES:  has no known allergies.  MEDICATIONS:  Current Outpatient Medications  Medication Sig Dispense Refill   acetaminophen  (TYLENOL ) 500 MG tablet Take 1,000 mg by mouth every 8 (eight) hours as needed for moderate pain.     amLODipine  (NORVASC ) 10 MG tablet Take 10 mg by mouth daily.     atorvastatin  (LIPITOR) 20 MG tablet Take 20 mg by mouth at bedtime.  (Patient taking differently:  Take 10 mg by mouth at bedtime.)     Blood Glucose Monitoring Suppl (ACCU-CHEK AVIVA PLUS) w/Device KIT To check blood sugar vitro three times a day; Duration: 90 days     carteolol (OCUPRESS) 1 % ophthalmic solution Place 1 drop into the right eye every morning.     cetirizine (ZYRTEC) 10 MG tablet Take 10 mg by mouth daily as needed for allergies.     ciprofloxacin (CIPRO) 500 MG tablet Take 500 mg by mouth 2 (two) times daily.     clotrimazole-betamethasone  (LOTRISONE) lotion Apply 1 Application topically daily as needed (irritation).     glimepiride  (AMARYL ) 1 MG tablet Take 1 mg by mouth daily with breakfast.     glucose blood (ACCU-CHEK AVIVA PLUS) test strip To check blood sugars In Vitro two times a day; Duration: 90 days     ibuprofen (ADVIL) 200 MG tablet Take 600 mg by mouth every 8 (eight) hours as needed for moderate pain.     latanoprost (XALATAN) 0.005 % ophthalmic solution Place 1 drop into the right eye at bedtime.     levothyroxine  (SYNTHROID , LEVOTHROID) 100 MCG tablet Take 100 mcg by mouth daily before breakfast.      lidocaine -prilocaine  (EMLA ) cream Apply 1 Application topically as needed. 30 g 0   losartan  (COZAAR ) 100 MG tablet Take 100 mg by mouth at bedtime.      meloxicam (MOBIC) 15 MG tablet Take 15 mg by mouth daily.     Misc Natural Products (COMPLETE PROSTATE HEALTH PO) Take 1 tablet by mouth 2 (two) times daily.     omeprazole (PRILOSEC) 20 MG capsule Take 20 mg by mouth daily.     ondansetron  (ZOFRAN ) 8 MG tablet Take 1 tablet (8 mg total) by mouth every 8 (eight) hours as needed for nausea or vomiting. 30 tablet 1   oxybutynin  (DITROPAN -XL) 5 MG 24 hr tablet TAKE 1 TABLET (5 MG TOTAL) BY MOUTH ONCE A WEEK. TAKE 2 HOURS BEFORE SCHEDULED CHEMO IN OUR OFFICE 90 tablet 1   oxymetazoline (AFRIN) 0.05 % nasal spray Place 1 spray into both nostrils 2 (two) times daily.     propranolol  (INDERAL ) 20 MG tablet TAKE 1 TABLET BY MOUTH TWICE A DAY 180 tablet 1   sitaGLIPtin  (JANUVIA) 100 MG tablet Take 100 mg by mouth every morning.     tamsulosin  (FLOMAX ) 0.4 MG CAPS capsule Take 0.4 mg by mouth every morning.      terbinafine  (LAMISIL ) 250 MG tablet Take 1 tablet (250 mg total) by mouth daily. 30 tablet 3   vitamin B-12 (  CYANOCOBALAMIN ) 500 MCG tablet Take 500 mcg by mouth daily.     No current facility-administered medications for this visit.    PHYSICAL EXAMINATION: ECOG PERFORMANCE STATUS: 2 - Symptomatic, <50% confined to bed  Vitals:   06/03/24 1410  BP: (!) 110/50  Pulse: 84  Temp: 98.3 F (36.8 C)  SpO2: 98%   Wt Readings from Last 3 Encounters:  06/03/24 187 lb 6.4 oz (85 kg)  05/24/24 184 lb (83.5 kg)  05/14/24 185 lb 8 oz (84.1 kg)     GENERAL:alert, no distress and comfortable SKIN: skin color, texture, turgor are normal, no rashes or significant lesions EYES: normal, Conjunctiva are pink and non-injected, sclera clear NECK: supple, thyroid  normal size, non-tender, without nodularity LYMPH:  no palpable lymphadenopathy in the cervical, axillary  LUNGS: clear to auscultation and percussion with normal breathing effort HEART: regular rate & rhythm and no murmurs and no lower extremity edema ABDOMEN:abdomen soft, non-tender and normal bowel sounds Musculoskeletal:no cyanosis of digits and no clubbing  NEURO: alert & oriented x 3 with fluent speech, no focal motor/sensory deficits  Physical Exam    LABORATORY DATA:  I have reviewed the data as listed    Latest Ref Rng & Units 06/03/2024    1:28 PM 05/14/2024    9:02 AM 04/20/2024    8:56 AM  CBC  WBC 4.0 - 10.5 K/uL 5.6  6.5  5.7   Hemoglobin 13.0 - 17.0 g/dL 88.1  88.3  88.0   Hematocrit 39.0 - 52.0 % 34.3  34.2  35.3   Platelets 150 - 400 K/uL 167  166  163         Latest Ref Rng & Units 05/14/2024    9:02 AM 04/21/2024   10:34 AM 04/20/2024    8:56 AM  CMP  Glucose 70 - 99 mg/dL 787   793   BUN 8 - 23 mg/dL 14   16   Creatinine 9.38 - 1.24 mg/dL 8.98   8.95   Sodium  864 - 145 mmol/L 139   138   Potassium 3.5 - 5.1 mmol/L 4.0   3.9   Chloride 98 - 111 mmol/L 105   105   CO2 22 - 32 mmol/L 30   29   Calcium  8.9 - 10.3 mg/dL 9.3   9.1   Total Protein 6.5 - 8.1 g/dL 6.9  6.6  6.7   Total Bilirubin 0.0 - 1.2 mg/dL 0.4   0.4   Alkaline Phos 38 - 126 U/L 55   49   AST 15 - 41 U/L 13   12   ALT 0 - 44 U/L 13   15       RADIOGRAPHIC STUDIES: I have personally reviewed the radiological images as listed and agreed with the findings in the report. No results found.    Orders Placed This Encounter  Procedures   CBC with Differential (Cancer Center Only)    Standing Status:   Future    Expected Date:   07/16/2024    Expiration Date:   07/16/2025   CMP (Cancer Center only)    Standing Status:   Future    Expected Date:   07/16/2024    Expiration Date:   07/16/2025   T4    Standing Status:   Future    Expected Date:   07/16/2024    Expiration Date:   07/16/2025   TSH    Standing Status:   Future    Expected Date:  07/16/2024    Expiration Date:   07/16/2025   CBC with Differential (Cancer Center Only)    Standing Status:   Future    Expected Date:   08/13/2024    Expiration Date:   08/13/2025   CMP (Cancer Center only)    Standing Status:   Future    Expected Date:   08/13/2024    Expiration Date:   08/13/2025   T4    Standing Status:   Future    Expected Date:   08/13/2024    Expiration Date:   08/13/2025   TSH    Standing Status:   Future    Expected Date:   08/13/2024    Expiration Date:   08/13/2025   CBC with Differential (Cancer Center Only)    Standing Status:   Future    Expected Date:   09/03/2024    Expiration Date:   09/03/2025   CMP (Cancer Center only)    Standing Status:   Future    Expected Date:   09/03/2024    Expiration Date:   09/03/2025   T4    Standing Status:   Future    Expected Date:   09/03/2024    Expiration Date:   09/03/2025   TSH    Standing Status:   Future    Expected Date:   09/03/2024    Expiration Date:   09/03/2025   All  questions were answered. The patient knows to call the clinic with any problems, questions or concerns. No barriers to learning was detected. The total time spent in the appointment was 25 minutes, including review of chart and various tests results, discussions about plan of care and coordination of care plan     Onita Mattock, MD 06/03/2024

## 2024-06-03 NOTE — Assessment & Plan Note (Addendum)
-  Stage I, no muscular invasive, initially diagnosed in 2017  -He was found to have high-grade urothelial carcinoma without muscle invasion and developed and subsequently BCG refractory disease in December 2021. He received Intravesicular gemcitabine  with a total of 2000 mg weekly X6 started in February 2022  -bladder biopsies, bladder washings on 06/24/2022 showed focal urothelial carcinoma in situ (CIS) and cytology was positive for high grade urothelial carcinoma -I recommended intravesical gemcitabine  2000 mg weekly x 6, he started on 08/28/16.  He tolerated first cycle poorly with significant hematuria, and intermittent fever for week after treatment. -Due to his poor tolerance to intravesical gemcitabine , I changed therapy to intravesical mitomycin  40 mg every week for 5 weeks. He tolerated the treatment very well  -His surveillance cystoscopy and biopsy were negative for residual cancer -Given his good response to intravesical mitomycin , and his high risk of recurrence, Dr. Matilda recommend small intravesical therapy.  Patient agrees to proceed, started in in April 2024. Unfortunately he has had recurrent leakage from the intravesical therapy, and not able to continue. -Treatment was switched to Keytruda  on March 18, 2023, he has been tolerating well overall.  Plan to continue every 3 weeks for up to 2 years if no recurrence

## 2024-06-03 NOTE — Patient Instructions (Signed)

## 2024-06-04 LAB — T4: T4, Total: 10.1 ug/dL (ref 4.5–12.0)

## 2024-06-07 DIAGNOSIS — C4441 Basal cell carcinoma of skin of scalp and neck: Secondary | ICD-10-CM | POA: Diagnosis not present

## 2024-06-08 DIAGNOSIS — C674 Malignant neoplasm of posterior wall of bladder: Secondary | ICD-10-CM | POA: Diagnosis not present

## 2024-06-08 DIAGNOSIS — N5201 Erectile dysfunction due to arterial insufficiency: Secondary | ICD-10-CM | POA: Diagnosis not present

## 2024-06-08 DIAGNOSIS — R3915 Urgency of urination: Secondary | ICD-10-CM | POA: Diagnosis not present

## 2024-06-14 ENCOUNTER — Ambulatory Visit (HOSPITAL_COMMUNITY)
Admission: RE | Admit: 2024-06-14 | Discharge: 2024-06-14 | Disposition: A | Discharge status: Home / Self Care | Source: Ambulatory Visit | Attending: Infectious Disease | Admitting: Infectious Disease

## 2024-06-14 DIAGNOSIS — M48062 Spinal stenosis, lumbar region with neurogenic claudication: Secondary | ICD-10-CM | POA: Diagnosis not present

## 2024-06-14 DIAGNOSIS — R509 Fever, unspecified: Secondary | ICD-10-CM | POA: Insufficient documentation

## 2024-06-14 DIAGNOSIS — C679 Malignant neoplasm of bladder, unspecified: Secondary | ICD-10-CM

## 2024-06-14 DIAGNOSIS — C670 Malignant neoplasm of trigone of bladder: Secondary | ICD-10-CM | POA: Diagnosis not present

## 2024-06-14 DIAGNOSIS — M4157 Other secondary scoliosis, lumbosacral region: Secondary | ICD-10-CM | POA: Diagnosis not present

## 2024-06-14 DIAGNOSIS — Z6826 Body mass index (BMI) 26.0-26.9, adult: Secondary | ICD-10-CM | POA: Diagnosis not present

## 2024-06-14 NOTE — Progress Notes (Signed)
  Echocardiogram 2D Echocardiogram has been performed.  Norleen ORN [facility] 06/14/2024, 11:33 AM

## 2024-06-15 LAB — ECHOCARDIOGRAM COMPLETE
Area-P 1/2: 4.12 cm2
Calc EF: 53.7 %
S' Lateral: 3.7 cm
Single Plane A2C EF: 45.1 %
Single Plane A4C EF: 56.6 %

## 2024-06-16 ENCOUNTER — Other Ambulatory Visit: Payer: Self-pay

## 2024-06-17 ENCOUNTER — Other Ambulatory Visit: Payer: Self-pay | Admitting: Neurosurgery

## 2024-06-18 ENCOUNTER — Other Ambulatory Visit: Payer: Self-pay

## 2024-06-22 DIAGNOSIS — Z08 Encounter for follow-up examination after completed treatment for malignant neoplasm: Secondary | ICD-10-CM | POA: Diagnosis not present

## 2024-06-22 DIAGNOSIS — Z85828 Personal history of other malignant neoplasm of skin: Secondary | ICD-10-CM | POA: Diagnosis not present

## 2024-06-24 ENCOUNTER — Inpatient Hospital Stay

## 2024-06-24 ENCOUNTER — Inpatient Hospital Stay: Attending: Hematology | Admitting: Hematology

## 2024-06-24 VITALS — BP 89/49 | HR 83 | Temp 98.7°F | Resp 15 | Ht 70.0 in | Wt 184.8 lb

## 2024-06-24 DIAGNOSIS — E860 Dehydration: Secondary | ICD-10-CM | POA: Diagnosis not present

## 2024-06-24 DIAGNOSIS — Z79620 Long term (current) use of immunosuppressive biologic: Secondary | ICD-10-CM | POA: Insufficient documentation

## 2024-06-24 DIAGNOSIS — I959 Hypotension, unspecified: Secondary | ICD-10-CM | POA: Insufficient documentation

## 2024-06-24 DIAGNOSIS — Z79899 Other long term (current) drug therapy: Secondary | ICD-10-CM | POA: Diagnosis not present

## 2024-06-24 DIAGNOSIS — R42 Dizziness and giddiness: Secondary | ICD-10-CM | POA: Insufficient documentation

## 2024-06-24 DIAGNOSIS — C679 Malignant neoplasm of bladder, unspecified: Secondary | ICD-10-CM | POA: Diagnosis not present

## 2024-06-24 DIAGNOSIS — C671 Malignant neoplasm of dome of bladder: Secondary | ICD-10-CM

## 2024-06-24 DIAGNOSIS — C670 Malignant neoplasm of trigone of bladder: Secondary | ICD-10-CM | POA: Insufficient documentation

## 2024-06-24 DIAGNOSIS — Z5112 Encounter for antineoplastic immunotherapy: Secondary | ICD-10-CM | POA: Diagnosis not present

## 2024-06-24 LAB — CBC WITH DIFFERENTIAL (CANCER CENTER ONLY)
Abs Immature Granulocytes: 0.02 K/uL (ref 0.00–0.07)
Basophils Absolute: 0 K/uL (ref 0.0–0.1)
Basophils Relative: 1 %
Eosinophils Absolute: 0.3 K/uL (ref 0.0–0.5)
Eosinophils Relative: 5 %
HCT: 34.2 % — ABNORMAL LOW (ref 39.0–52.0)
Hemoglobin: 11.6 g/dL — ABNORMAL LOW (ref 13.0–17.0)
Immature Granulocytes: 0 %
Lymphocytes Relative: 19 %
Lymphs Abs: 1.1 K/uL (ref 0.7–4.0)
MCH: 30.4 pg (ref 26.0–34.0)
MCHC: 33.9 g/dL (ref 30.0–36.0)
MCV: 89.5 fL (ref 80.0–100.0)
Monocytes Absolute: 0.5 K/uL (ref 0.1–1.0)
Monocytes Relative: 10 %
Neutro Abs: 3.7 K/uL (ref 1.7–7.7)
Neutrophils Relative %: 65 %
Platelet Count: 142 K/uL — ABNORMAL LOW (ref 150–400)
RBC: 3.82 MIL/uL — ABNORMAL LOW (ref 4.22–5.81)
RDW: 12.9 % (ref 11.5–15.5)
WBC Count: 5.6 K/uL (ref 4.0–10.5)
nRBC: 0 % (ref 0.0–0.2)

## 2024-06-24 LAB — CMP (CANCER CENTER ONLY)
ALT: 13 U/L (ref 0–44)
AST: 14 U/L — ABNORMAL LOW (ref 15–41)
Albumin: 4 g/dL (ref 3.5–5.0)
Alkaline Phosphatase: 54 U/L (ref 38–126)
Anion gap: 5 (ref 5–15)
BUN: 15 mg/dL (ref 8–23)
CO2: 28 mmol/L (ref 22–32)
Calcium: 9 mg/dL (ref 8.9–10.3)
Chloride: 105 mmol/L (ref 98–111)
Creatinine: 0.96 mg/dL (ref 0.61–1.24)
GFR, Estimated: >60 mL/min (ref >60)
Glucose, Bld: 248 mg/dL — ABNORMAL HIGH (ref 70–99)
Potassium: 3.8 mmol/L (ref 3.5–5.1)
Sodium: 138 mmol/L (ref 135–145)
Total Bilirubin: 0.4 mg/dL (ref 0.0–1.2)
Total Protein: 6.7 g/dL (ref 6.5–8.1)

## 2024-06-24 LAB — TSH: TSH: 1.56 u[IU]/mL (ref 0.350–4.500)

## 2024-06-24 MED ORDER — SODIUM CHLORIDE 0.9 % IV SOLN: INTRAVENOUS | Status: DC

## 2024-06-24 NOTE — Assessment & Plan Note (Signed)
-  Stage I, no muscular invasive, initially diagnosed in 2017  -He was found to have high-grade urothelial carcinoma without muscle invasion and developed and subsequently BCG refractory disease in December 2021. He received Intravesicular gemcitabine  with a total of 2000 mg weekly X6 started in February 2022  -bladder biopsies, bladder washings on 06/24/2022 showed focal urothelial carcinoma in situ (CIS) and cytology was positive for high grade urothelial carcinoma -I recommended intravesical gemcitabine  2000 mg weekly x 6, he started on 08/28/16.  He tolerated first cycle poorly with significant hematuria, and intermittent fever for week after treatment. -Due to his poor tolerance to intravesical gemcitabine , I changed therapy to intravesical mitomycin  40 mg every week for 5 weeks. He tolerated the treatment very well  -His surveillance cystoscopy and biopsy were negative for residual cancer -Given his good response to intravesical mitomycin , and his high risk of recurrence, Dr. Matilda recommend small intravesical therapy.  Patient agrees to proceed, started in in April 2024. Unfortunately he has had recurrent leakage from the intravesical therapy, and not able to continue. -Treatment was switched to Keytruda  on March 18, 2023, he has been tolerating well overall.  Plan to continue every 3 weeks for up to 2 years if no recurrence

## 2024-06-24 NOTE — Patient Instructions (Signed)
 Fluids Given Through an IV (IV Infusion Therapy): What to Expect IV infusion therapy is a treatment to deliver a fluid, called an infusion, into a vein. You may have IV infusion to get: Fluids. Medicines. Nutrition. Chemotherapy. This is medicines to stop or slow down cancer cells. Blood or blood products. Dye that is given before an MRI or a CT scan. This is called contrast dye. Tell a health care provider about: Any allergies you have. This includes allergies to anesthesia or dyes. All medicines you take. These include vitamins, herbs, eye drops, and creams. Any bleeding problems you have. Any surgeries you've had, including if you've had lymph nodes taken out of your armpit or if you have a arteriovenous fistula for dialysis. Any medical problems you have. Whether you're pregnant or may be pregnant. Whether you've used IV drugs. What are the risks? Your health care provider will talk with you about risks. These may include: Pain, bruising, or bleeding. Infection. The IV leaking or moving out of place. Damage to blood vessels or nerves. Allergic reactions to medicines or dyes. A blood clot. An air bubble in the vein, also called an air embolism. What happens before the procedure? Eat and drink only as you've been told. Ask about changing or stopping: Any medicines you take. Any vitamins, herbs, or supplements you take. What happens during the procedure?     Placing the catheter Your skin at the IV site will be washed with fluid that kills germs. This will help prevent infection. IV infusion therapy starts with a procedure to place a soft tube called a catheter into a vein. An IV tube will be attached to the catheter to let the infusion flow into your blood. Your catheter may be placed: Into a vein that is usually in the bend of the elbow, forearm, or back of the hand. This is called a peripheral IV catheter. This may need to be put into a vein each time you get an  infusion. Into a vein near your elbow. This is called a midline catheter or a peripherally inserted central catheter (PICC). These types of catheters may stay in place for weeks or months at a time so you can receive repeated infusions through it. Into a vein near your neck that leads to your heart. This is called a non-tunneled catheter. This is only used for short amounts of time because it can cause infection. Through the skin of your chest and into a large vein that leads to your heart. This is called a tunneled catheter. This may stay in your body for months or years. Into an implanted port. An implanted port is a device that is surgically inserted under the skin of the chest to provide long-term IV access. The catheter will connect the port to a large vein in the chest or upper arm. A port may be kept in place for many months or years. Each time you have an infusion, a needle will be inserted through your skin to connect the catheter to the port. Doing the infusion To start the infusion, your provider will: Attach the IV tubing to your catheter. Use a tape or a bandage to hold the IV in place against your skin. An IV pump may be used to control the flow of the IV infusion. During the infusion, your provider will check the area to make sure: There is no bleeding, swelling, or pain. Your IV infusion is flowing correctly. After the infusion, your provider will: Take off the bandage  or tape. Disconnect the tubing from the catheter. Remove the catheter, if you have a peripheral IV. Apply pressure over the IV insertion site to stop bleeding, then cover the area with a bandage. If you have an implanted port, PICC, non-tunneled, or tunneled catheter, your catheter may remain in place. This depends on how many times you will need treatment, your medical condition, and what type of catheter you have. These steps may vary. Ask what you can expect. What can I expect after the procedure? You may be  watched closely until you leave. This includes checking your pain level, blood pressure, heart rate, and breathing rate. Your provider will check to make sure there are no signs of infection. Follow these instructions at home: Take your medicines only as told. Change or take off your bandage as told by your provider. Ask what things are safe for you to do at home. Ask when you can go back to work or school. Do not take baths, swim, or use a hot tub until you're told it's OK. Ask if you can shower. Check your IV insertion site every day for signs of infection. Check for: Redness, swelling, or pain. Fluid or blood. If fluid or blood drains from your IV site, use your hands to press down firmly on the area for a minute or two. Doing this should stop the bleeding. Warmth. Pus or a bad smell. Contact a health care provider if: You have signs of infection around your IV site. You have fluid or blood coming from your IV site that does not stop after you put pressure to the site. You have a rash or blisters. You have itchy, red, swollen areas of skin called hives. Get help right away if: You have a fever or chills. You have chest pain. You have trouble breathing. This information is not intended to replace advice given to you by your health care provider. Make sure you discuss any questions you have with your health care provider. Document Revised: 01/21/2023 Document Reviewed: 01/21/2023 Elsevier Patient Education  2024 [employer].

## 2024-06-24 NOTE — Progress Notes (Signed)
 Verbal order w/readback no chemo today give 1L NS over 2 hrs.

## 2024-06-24 NOTE — Progress Notes (Signed)
 Surgical Instructions   Your procedure is scheduled on Wednesday, November 19th, 2025. Report to [facility] Main Entrance A at 9:30 A.M., then check in with the Admitting office. Any questions or running late day of surgery: call [phone]  Questions prior to your surgery date: call [phone], Monday-Friday, 8am-4pm. If you experience any cold or flu symptoms such as cough, fever, chills, shortness of breath, etc. between now and your scheduled surgery, please notify us  at the above number.     Remember:  Do not eat or drink after midnight the night before your surgery    Take these medicines the morning of surgery with A SIP OF WATER : Amlodipine  (Norvasc ) Caretolol (Ocupress) eye drop Levothyroxine  (Synthroid ) Omeprazole (Prilosec) Oxymetazoline (Afrin) Nasal spray Propranolol  (Inderal ) Tamsulosin  (Flomax ) Terbinafine  (Lamisil )   May take these medicines IF NEEDED: Acetaminophen  (Tylenol ) Loratadine  (Claritin ) Ondansetron  (Zofran )    One week prior to surgery, STOP taking any Aspirin (unless otherwise instructed by your surgeon) Aleve, Naproxen, Ibuprofen, Motrin, Advil, Goody's, BC's, all herbal medications, fish oil, and non-prescription vitamins.  This includes Meloxicam (Mobic).     WHAT DO I DO ABOUT MY DIABETES MEDICATION?   Do not take Sitagliptan (Januvia) on the day of surgery.      HOW TO MANAGE YOUR DIABETES BEFORE AND AFTER SURGERY  Why is it important to control my blood sugar before and after surgery? Improving blood sugar levels before and after surgery helps healing and can limit problems. A way of improving blood sugar control is eating a healthy diet by:  Eating less sugar and carbohydrates  Increasing activity/exercise  Talking with your doctor about reaching your blood sugar goals High blood sugars (greater than 180 mg/dL) can raise your risk of infections and slow your recovery, so you will need to focus on controlling your diabetes  during the weeks before surgery. Make sure that the doctor who takes care of your diabetes knows about your planned surgery including the date and location.  How do I manage my blood sugar before surgery? Check your blood sugar at least 4 times a day, starting 2 days before surgery, to make sure that the level is not too high or low.  Check your blood sugar the morning of your surgery when you wake up and every 2 hours until you get to the Short Stay unit.  If your blood sugar is less than 70 mg/dL, you will need to treat for low blood sugar: Do not take insulin. Treat a low blood sugar (less than 70 mg/dL) with  cup of clear juice (cranberry or apple), 4 glucose tablets, OR glucose gel. Recheck blood sugar in 15 minutes after treatment (to make sure it is greater than 70 mg/dL). If your blood sugar is not greater than 70 mg/dL on recheck, call [phone] for further instructions. Report your blood sugar to the short stay nurse when you get to Short Stay.  If you are admitted to the hospital after surgery: Your blood sugar will be checked by the staff and you will probably be given insulin after surgery (instead of oral diabetes medicines) to make sure you have good blood sugar levels. The goal for blood sugar control after surgery is 80-180 mg/dL.                      Do NOT Smoke (Tobacco/Vaping) for 24 hours prior to your procedure.  If you use a CPAP at night, you may bring your mask/headgear for your overnight stay.  You will be asked to remove any contacts, glasses, piercing's, hearing aid's, dentures/partials prior to surgery. Please bring cases for these items if needed.    Patients discharged the day of surgery will not be allowed to drive home, and someone needs to stay with them for 24 hours.  SURGICAL WAITING ROOM VISITATION Patients may have no more than 2 support people in the waiting area - these visitors may rotate.   Pre-op nurse will coordinate an appropriate time  for 1 ADULT support person, who may not rotate, to accompany patient in pre-op.  Children under the age of 76 must have an adult with them who is not the patient and must remain in the main waiting area with an adult.  If the patient needs to stay at the hospital during part of their recovery, the visitor guidelines for inpatient rooms apply.  Please refer to the [facility] website for the visitor guidelines for any additional information.   If you received a COVID test during your pre-op visit  it is requested that you wear a mask when out in public, stay away from anyone that may not be feeling well and notify your surgeon if you develop symptoms. If you have been in contact with anyone that has tested positive in the last 10 days please notify you surgeon.      Pre-operative 4 CHG Bathing Instructions   You can play a key role in reducing the risk of infection after surgery. Your skin needs to be as free of germs as possible. You can reduce the number of germs on your skin by washing with CHG (chlorhexidine  gluconate) soap before surgery. CHG is an antiseptic soap that kills germs and continues to kill germs even after washing.   DO NOT use if you have an allergy to chlorhexidine /CHG or antibacterial soaps. If your skin becomes reddened or irritated, stop using the CHG and notify one of our RNs at [phone].   Please shower with the CHG soap starting 4 days before surgery using the following schedule:     Please keep in mind the following:  DO NOT shave, including legs and underarms, starting the day of your first shower.   You may shave your face at any point before/day of surgery.  Place clean sheets on your bed the day you start using CHG soap. Use a clean washcloth (not used since being washed) for each shower. DO NOT sleep with pets once you start using the CHG.   CHG Shower Instructions:  Wash your face and private area with normal soap. If you choose to wash your hair,  wash first with your normal shampoo.  After you use shampoo/soap, rinse your hair and body thoroughly to remove shampoo/soap residue.  Turn the water  OFF and apply  bottle of CHG soap to a CLEAN washcloth.  Apply CHG soap ONLY FROM YOUR NECK DOWN TO YOUR TOES (washing for 3-5 minutes)  DO NOT use CHG soap on face, private areas, open wounds, or sores.  Pay special attention to the area where your surgery is being performed.  If you are having back surgery, having someone wash your back for you may be helpful. Wait 2 minutes after CHG soap is applied, then you may rinse off the CHG soap.  Pat dry with a clean towel  Put on clean clothes/pajamas   If you choose to wear lotion, please use ONLY the CHG-compatible lotions that are listed below.  Additional instructions for the day of surgery:  If you choose, you may shower the morning of surgery with an antibacterial soap.  DO NOT APPLY any lotions, deodorants, cologne, or perfumes.   Do not bring valuables to the hospital. [facility] is not responsible for any belongings/valuables. Do not wear nail polish, gel polish, artificial nails, or any other type of covering on natural nails (fingers and toes) Do not wear jewelry or makeup Put on clean/comfortable clothes.  Please brush your teeth.  Ask your nurse before applying any prescription medications to the skin.     CHG Compatible Lotions   Aveeno Moisturizing lotion  Cetaphil Moisturizing Cream  Cetaphil Moisturizing Lotion  Clairol Herbal Essence Moisturizing Lotion, Dry Skin  Clairol Herbal Essence Moisturizing Lotion, Extra Dry Skin  Clairol Herbal Essence Moisturizing Lotion, Normal Skin  Curel Age Defying Therapeutic Moisturizing Lotion with Alpha Hydroxy  Curel Extreme Care Body Lotion  Curel Soothing Hands Moisturizing Hand Lotion  Curel Therapeutic Moisturizing Cream, Fragrance-Free  Curel Therapeutic Moisturizing Lotion, Fragrance-Free  Curel Therapeutic Moisturizing  Lotion, Original Formula  Eucerin Daily Replenishing Lotion  Eucerin Dry Skin Therapy Plus Alpha Hydroxy Crme  Eucerin Dry Skin Therapy Plus Alpha Hydroxy Lotion  Eucerin Original Crme  Eucerin Original Lotion  Eucerin Plus Crme Eucerin Plus Lotion  Eucerin TriLipid Replenishing Lotion  Keri Anti-Bacterial Hand Lotion  Keri Deep Conditioning Original Lotion Dry Skin Formula Softly Scented  Keri Deep Conditioning Original Lotion, Fragrance Free Sensitive Skin Formula  Keri Lotion Fast Absorbing Fragrance Free Sensitive Skin Formula  Keri Lotion Fast Absorbing Softly Scented Dry Skin Formula  Keri Original Lotion  Keri Skin Renewal Lotion Keri Silky Smooth Lotion  Keri Silky Smooth Sensitive Skin Lotion  Nivea Body Creamy Conditioning Oil  Nivea Body Extra Enriched Lotion  Nivea Body Original Lotion  Nivea Body Sheer Moisturizing Lotion Nivea Crme  Nivea Skin Firming Lotion  NutraDerm 30 Skin Lotion  NutraDerm Skin Lotion  NutraDerm Therapeutic Skin Cream  NutraDerm Therapeutic Skin Lotion  ProShield Protective Hand Cream  Provon moisturizing lotion  Please read over the following fact sheets that you were given.

## 2024-06-25 ENCOUNTER — Encounter: Payer: Self-pay | Admitting: Hematology

## 2024-06-25 ENCOUNTER — Other Ambulatory Visit: Payer: Self-pay

## 2024-06-25 ENCOUNTER — Encounter (HOSPITAL_COMMUNITY): Payer: Self-pay

## 2024-06-25 ENCOUNTER — Encounter (HOSPITAL_COMMUNITY): Payer: Self-pay | Admitting: Vascular Surgery

## 2024-06-25 ENCOUNTER — Encounter (HOSPITAL_COMMUNITY)
Admission: RE | Admit: 2024-06-25 | Discharge: 2024-06-25 | Disposition: A | Discharge status: Home / Self Care | Source: Ambulatory Visit | Attending: Neurosurgery | Admitting: Neurosurgery

## 2024-06-25 VITALS — BP 143/76 | HR 79 | Temp 98.2°F | Resp 17 | Ht 70.0 in | Wt 184.3 lb

## 2024-06-25 DIAGNOSIS — K219 Gastro-esophageal reflux disease without esophagitis: Secondary | ICD-10-CM | POA: Diagnosis not present

## 2024-06-25 DIAGNOSIS — Z01818 Encounter for other preprocedural examination: Secondary | ICD-10-CM | POA: Diagnosis not present

## 2024-06-25 DIAGNOSIS — Z87891 Personal history of nicotine dependence: Secondary | ICD-10-CM | POA: Diagnosis not present

## 2024-06-25 DIAGNOSIS — I4510 Unspecified right bundle-branch block: Secondary | ICD-10-CM | POA: Diagnosis not present

## 2024-06-25 DIAGNOSIS — Z8551 Personal history of malignant neoplasm of bladder: Secondary | ICD-10-CM | POA: Insufficient documentation

## 2024-06-25 DIAGNOSIS — E039 Hypothyroidism, unspecified: Secondary | ICD-10-CM | POA: Diagnosis not present

## 2024-06-25 DIAGNOSIS — H5461 Unqualified visual loss, right eye, normal vision left eye: Secondary | ICD-10-CM | POA: Insufficient documentation

## 2024-06-25 DIAGNOSIS — I10 Essential (primary) hypertension: Secondary | ICD-10-CM | POA: Diagnosis not present

## 2024-06-25 DIAGNOSIS — I4891 Unspecified atrial fibrillation: Secondary | ICD-10-CM | POA: Insufficient documentation

## 2024-06-25 DIAGNOSIS — E119 Type 2 diabetes mellitus without complications: Secondary | ICD-10-CM | POA: Diagnosis not present

## 2024-06-25 DIAGNOSIS — Z7984 Long term (current) use of oral hypoglycemic drugs: Secondary | ICD-10-CM | POA: Diagnosis not present

## 2024-06-25 DIAGNOSIS — M48062 Spinal stenosis, lumbar region with neurogenic claudication: Secondary | ICD-10-CM | POA: Diagnosis not present

## 2024-06-25 DIAGNOSIS — E785 Hyperlipidemia, unspecified: Secondary | ICD-10-CM | POA: Diagnosis not present

## 2024-06-25 LAB — HEMOGLOBIN A1C
Hgb A1c MFr Bld: 7.6 % — ABNORMAL HIGH (ref 4.8–5.6)
Mean Plasma Glucose: 171.42 mg/dL

## 2024-06-25 LAB — SURGICAL PCR SCREEN
MRSA, PCR: NEGATIVE
Staphylococcus aureus: NEGATIVE

## 2024-06-25 LAB — T4: T4, Total: 10.1 ug/dL (ref 4.5–12.0)

## 2024-06-25 LAB — GLUCOSE, CAPILLARY: Glucose-Capillary: 265 mg/dL — ABNORMAL HIGH (ref 70–99)

## 2024-06-25 LAB — TYPE AND SCREEN
ABO/RH(D): A NEG
Antibody Screen: NEGATIVE

## 2024-06-25 NOTE — Progress Notes (Signed)
 [facility]   [phone] [phone]   Clinic Follow up Note   Patient Care Team: Valentin Skates, DO as PCP - General (Internal Medicine) Lanny Callander, MD as Attending Physician (Hematology and Oncology) Matilda Senior, MD as Consulting Physician (Urology)  Date of Service:  06/25/2024  CHIEF COMPLAINT: f/u of bladder cancer  CURRENT THERAPY:  Keytruda  every 3 weeks  Oncology History   Bladder cancer (HCC) -Stage I, no muscular invasive, initially diagnosed in 2017  -He was found to have high-grade urothelial carcinoma without muscle invasion and developed and subsequently BCG refractory disease in December 2021. He received Intravesicular gemcitabine  with a total of 2000 mg weekly X6 started in February 2022  -bladder biopsies, bladder washings on 06/24/2022 showed focal urothelial carcinoma in situ (CIS) and cytology was positive for high grade urothelial carcinoma -I recommended intravesical gemcitabine  2000 mg weekly x 6, he started on 08/28/16.  He tolerated first cycle poorly with significant hematuria, and intermittent fever for week after treatment. -Due to his poor tolerance to intravesical gemcitabine , I changed therapy to intravesical mitomycin  40 mg every week for 5 weeks. He tolerated the treatment very well  -His surveillance cystoscopy and biopsy were negative for residual cancer -Given his good response to intravesical mitomycin , and his high risk of recurrence, Dr. Matilda recommend small intravesical therapy.  Patient agrees to proceed, started in in April 2024. Unfortunately he has had recurrent leakage from the intravesical therapy, and not able to continue. -Treatment was switched to Keytruda  on March 18, 2023, he has been tolerating well overall.  Plan to continue every 3 weeks for up to 2 years if no recurrence  Assessment & Plan Malignant neoplasm of bladder Undergoing treatment for bladder cancer. Diarrhea may be related to  treatment, but uncertain. Treatment can cause colitis, but has been on it for a long time. - Proceed with current treatment regimen - Monitor for worsening diarrhea post-treatment - Will consider stopping treatment if diarrhea worsens significantly  Diarrhea and abdominal bloating Experiencing diarrhea for about two weeks, with 3-4 watery bowel movements per day. Abdominal bloating noted for the last week. No blood in stool, no fever, and no nausea. Imodium has been used with some relief. Differential includes possible viral infection or dietary cause. Abdominal ultrasound planned to check for fluid accumulation. - Ordered abdominal ultrasound to check for fluid accumulation - Continue Imodium as needed for diarrhea  Dizziness and hypotension Reports dizziness, especially when standing up quickly. Describes feeling lightheaded, similar to being drunk. Orthostatic blood pressure measurement planned to assess for possible orthostatic hypotension.  Mildly decreased cardiac contractile function Recent echocardiogram shows mildly decreased cardiac contractile function with an ejection fraction of 45-50%. Cavity size is more dilated. No current cardiologist involved in care. - Sent message to Dr. Fleeta Aris to discuss potential referral to a cardiologist - Monitor cardiac function and symptoms  Plan - Due to his diabetes, and hypertension, will hold treatment and give IV fluids - Will reach out to his ID regarding his low EF on echo, to see if he needs cardiology referral -will copy Dr. Gillie regarding the above and his upcoming back surgery next week. -will obtain abdominal US  to rule out ascites    SUMMARY OF ONCOLOGIC HISTORY: Oncology History Overview Note   Cancer Staging  Bladder cancer [facility]) Staging form: Urinary Bladder, AJCC 8th Edition - Clinical: Stage I (cT1, cN0, cM0) - Signed by Amadeo Windell SAILOR, MD on 09/12/2020 WHO/ISUP grade (low/high): High Grade  Histologic grading system: 2  grade system - Pathologic stage from 06/24/2022: Stage 0is (pTis, pN0, cM0) - Signed by Lanny Callander, MD on 08/24/2022 Stage prefix: Initial diagnosis WHO/ISUP grade (low/high): High Grade Histologic grading system: 2 grade system     Bladder cancer (HCC)  09/12/2020 Initial Diagnosis   Bladder cancer (HCC)   09/12/2020 Cancer Staging   Staging form: Urinary Bladder, AJCC 8th Edition - Clinical: Stage I (cT1, cN0, cM0) - Signed by Amadeo Windell SAILOR, MD on 09/12/2020 WHO/ISUP grade (low/high): High Grade Histologic grading system: 2 grade system   06/24/2022 Pathology Results             FINAL MICROSCOPIC DIAGNOSIS:  A. BLADDER, BIOPSY: Focal urothelial carcinoma in situ (CIS)     06/24/2022 Cancer Staging   Staging form: Urinary Bladder, AJCC 8th Edition - Pathologic stage from 06/24/2022: Stage 0is (pTis, pN0, cM0) - Signed by Lanny Callander, MD on 08/24/2022 Stage prefix: Initial diagnosis WHO/ISUP grade (low/high): High Grade Histologic grading system: 2 grade system   08/28/2022 - 08/28/2022 Chemotherapy   Patient is on Treatment Plan : BLADDER Gemcitabine  INTRAVESICAL (2000) q7d     09/12/2022 - 02/26/2023 Chemotherapy   Patient is on Treatment Plan : BLADDER Mitomycin  INTRAVESICAL (40 mg) q7d     10/25/2022 Genetic Testing   Negative [facility] CancerNext-Expanded +RNA Panel.  Report date is 10/25/2022.   The CancerNext-Expanded gene panel offered by [facility] and includes sequencing, rearrangement, and RNA analysis for the following 77 genes: AIP, ALK, APC, ATM, AXIN2, BAP1, BARD1, BLM, BMPR1A, BRCA1, BRCA2, BRIP1, CDC73, CDH1, CDK4, CDKN1B, CDKN2A, CHEK2, CTNNA1, DICER1, FANCC, FH, FLCN, GALNT12, KIF1B, LZTR1, MAX, MEN1, MET, MLH1, MSH2, MSH3, MSH6, MUTYH, NBN, NF1, NF2, NTHL1, PALB2, PHOX2B, PMS2, POT1, PRKAR1A, PTCH1, PTEN, RAD51C, RAD51D, RB1, RECQL, RET, SDHA, SDHAF2, SDHB, SDHC, SDHD, SMAD4, SMARCA4, SMARCB1, SMARCE1, STK11, SUFU, TMEM127, TP53, TSC1, TSC2, VHL and XRCC2  (sequencing and deletion/duplication); EGFR, EGLN1, HOXB13, KIT, MITF, PDGFRA, POLD1, and POLE (sequencing only); EPCAM and GREM1 (deletion/duplication only).    03/18/2023 -  Chemotherapy   Patient is on Treatment Plan : BLADDER Pembrolizumab  (200) q21d     Malignant neoplasm of trigone of urinary bladder (HCC)  09/12/2020 Initial Diagnosis   Malignant neoplasm of trigone of urinary bladder (HCC)   09/26/2020 - 10/31/2020 Chemotherapy   Patient is on Treatment Plan : BLADDER Gemcitabine  q7d     04/06/2024 Imaging   CT CAP with contrast  IMPRESSION: 1. No acute CT findings of the chest, abdomen, or pelvis to explain fever. 2. No evidence of bladder mass. No evidence of lymphadenopathy or metastatic disease in the chest, abdomen, or pelvis. 3. Prostatomegaly. 4. Punctuate nonobstructive calculus of the superior pole of the left kidney. No right-sided calculi, ureteral calculi, or hydronephrosis. 5. Emphysema and diffuse bilateral bronchial wall thickening. 6. Coronary artery disease.   04/08/2024 Imaging   CT CAP with contrast IMPRESSION: 1. No acute CT findings of the chest, abdomen, or pelvis to explain fever. 2. No evidence of bladder mass. No evidence of lymphadenopathy or metastatic disease in the chest, abdomen, or pelvis. 3. Prostatomegaly. 4. Punctuate nonobstructive calculus of the superior pole of the left kidney. No right-sided calculi, ureteral calculi, or hydronephrosis. 5. Emphysema and diffuse bilateral bronchial wall thickening. 6. Coronary artery disease.      Discussed the use of AI scribe software for clinical note transcription with the patient, who gave verbal consent to proceed.  History of Present Illness Keith [name] is an  83 year old male with bladder cancer who presents for follow-up.  He experiences generalized pain, particularly in the back, radiating to the legs. He is scheduled for back surgery on November 19th. Bloating and diarrhea have been  present for one to two weeks, with diarrhea occurring three to four times daily. Imodium provided initial relief, but symptoms recurred. No blood in the stool, nausea, or abdominal pain. He maintains a stable weight of 184 pounds with no changes in appetite. Occasional dizziness occurs, especially when standing quickly. Leg swelling is noted, attributed to not wearing diabetic socks, with regular socks leaving a ring around his legs.     All other systems were reviewed with the patient and are negative.  MEDICAL HISTORY:  Past Medical History:  Diagnosis Date   Bladder cancer (HCC)    Blind right eye    BPH (benign prostatic hypertrophy)    Cataracts, both eyes    Depression    DM type 2 (diabetes mellitus, type 2) (HCC)    DOE (dyspnea on exertion)    with heavy exertion   Full dentures    FUO (fever of unknown origin) 04/20/2024   GERD (gastroesophageal reflux disease)    Glaucoma no peripheral or side vision   right eye blind   Glaucoma, right eye    HOH (hard of hearing)    Hyperlipidemia    Hypertension    Hypothyroidism    Local recurrence of cancer of urinary bladder (HCC) urologsit-  dr matilda   1st dx 01-29-2016--  now recurrent 05-31-2016   Lower extremity edema 07/11/2020   right swells more than left goes down at night after sleeping   Lower urinary tract symptoms (LUTS) 2019   Lumbar stenosis 05/24/2024   Migraine    Peripheral neuropathy    middle finger left hand   Pneumonia 2019   Skin abnormality    base of spine looks like pimple has had since 07-01-2020, no drainage area intact   Wears glasses     SURGICAL HISTORY: Past Surgical History:  Procedure Laterality Date   ANTERIOR CERVICAL DECOMP/DISCECTOMY FUSION  08-15-2000   C5 -- C6   ANTERIOR CERVICAL DECOMP/DISCECTOMY FUSION  03-04-2008   C6 -- C7   areas removed from back at spine  2021 jan and aug 2021   CYSTOSCOPY W/ RETROGRADES Left 05/11/2020   Procedure: Cystoscopy with left retrograde  ureteropyelogram, resection of probable bladder tumor and left trigonal/ureteral orifice region, 3 cm in size, fluoroscopic interpretation, left ureteroscopy, placement of 6 French by 26 cm contour double-J stent without tether;  Surgeon: Matilda Senior, MD;  Location: [facility];  Service: Urology;  Laterality: Left;   CYSTOSCOPY W/ RETROGRADES Bilateral 06/24/2022   Procedure: CYSTOSCOPY WITH RETROGRADE PYELOGRAM;  Surgeon: Matilda Senior, MD;  Location: [facility];  Service: Urology;  Laterality: Bilateral;  1 HR   CYSTOSCOPY WITH BIOPSY N/A 01/29/2016   Procedure: CYSTOSCOPY WITH BIOPSY;  Surgeon: Senior Matilda, MD;  Location: [facility];  Service: Urology;  Laterality: N/A;   CYSTOSCOPY WITH BIOPSY N/A 05/31/2016   Procedure: CYSTOSCOPY WITH BIOPSY;  Surgeon: Senior Matilda, MD;  Location: [facility];  Service: Urology;  Laterality: N/A;   CYSTOSCOPY WITH BIOPSY N/A 06/24/2022   Procedure: CYSTOSCOPY WITH BIOPSY;  Surgeon: Matilda Senior, MD;  Location: [facility];  Service: Urology;  Laterality: N/A;   EYE SURGERY Right 1998 & 2000   [place]   GLAUCOMA SURGERY  1998 and 2000   IR  IMAGING GUIDED PORT INSERTION  03/31/2023   lower back  06/01/2019, 02-10-2020   nose cancer  areas removed  2019, 2021   x 2 / 3/ 2021 small area removed from nose   surgery for sacral fracture  40 yrs ago   TRANSURETHRAL RESECTION OF BLADDER TUMOR N/A 07/17/2020   Procedure: TRANSURETHRAL RESECTION OF BLADDER TUMOR (TURBT);  Surgeon: Matilda Senior, MD;  Location: [facility];  Service: Urology;  Laterality: N/A;    I have reviewed the social history and family history with the patient and they are unchanged from previous note.  ALLERGIES:  has no known allergies.  MEDICATIONS:  Current Outpatient Medications  Medication Sig Dispense Refill   acetaminophen  (TYLENOL ) 650 MG CR tablet Take 1,300 mg by mouth every 8 (eight) hours as  needed for pain.     amLODipine  (NORVASC ) 10 MG tablet Take 10 mg by mouth daily.     atorvastatin  (LIPITOR) 20 MG tablet Take 20 mg by mouth at bedtime.      Blood Glucose Monitoring Suppl (ACCU-CHEK AVIVA PLUS) w/Device KIT To check blood sugar vitro three times a day; Duration: 90 days     carteolol (OCUPRESS) 1 % ophthalmic solution Place 1 drop into the right eye every morning.     cyanocobalamin  (VITAMIN B12) 1000 MCG tablet Take 1,000 mcg by mouth daily.     glucose blood (ACCU-CHEK AVIVA PLUS) test strip To check blood sugars In Vitro two times a day; Duration: 90 days     latanoprost (XALATAN) 0.005 % ophthalmic solution Place 1 drop into the right eye at bedtime.     levothyroxine  (SYNTHROID , LEVOTHROID) 100 MCG tablet Take 100 mcg by mouth daily before breakfast.      loratadine  (CLARITIN ) 10 MG tablet Take 10 mg by mouth daily as needed for allergies.     losartan  (COZAAR ) 100 MG tablet Take 100 mg by mouth at bedtime.      meloxicam (MOBIC) 15 MG tablet Take 15 mg by mouth daily as needed for pain.     omeprazole (PRILOSEC) 20 MG capsule Take 20 mg by mouth daily.     ondansetron  (ZOFRAN ) 8 MG tablet Take 1 tablet (8 mg total) by mouth every 8 (eight) hours as needed for nausea or vomiting. 30 tablet 1   OVER THE COUNTER MEDICATION Take 2 capsules by mouth daily. OmegaXL (perna canaliculus oil + PCSO-524)     oxymetazoline (AFRIN) 0.05 % nasal spray Place 1 spray into both nostrils 2 (two) times daily.     propranolol  (INDERAL ) 20 MG tablet TAKE 1 TABLET BY MOUTH TWICE A DAY 180 tablet 1   sitaGLIPtin (JANUVIA) 100 MG tablet Take 100 mg by mouth every morning.     tamsulosin  (FLOMAX ) 0.4 MG CAPS capsule Take 0.4 mg by mouth every morning.      terbinafine  (LAMISIL ) 250 MG tablet Take 1 tablet (250 mg total) by mouth daily. 30 tablet 3   No current facility-administered medications for this visit.    PHYSICAL EXAMINATION: ECOG PERFORMANCE STATUS: 2 - Symptomatic, <50% confined to  bed  Vitals:   06/24/24 1210 06/24/24 1211  BP: 93/64 (!) 89/49  Pulse: (!) 49 83  Resp:    Temp:    SpO2: 100% 97%   Wt Readings from Last 3 Encounters:  06/25/24 184 lb 4.8 oz (83.6 kg)  06/24/24 184 lb 12.8 oz (83.8 kg)  06/03/24 187 lb 6.4 oz (85 kg)     GENERAL:alert, no  distress and comfortable SKIN: skin color, texture, turgor are normal, no rashes or significant lesions EYES: normal, Conjunctiva are pink and non-injected, sclera clear NECK: supple, thyroid  normal size, non-tender, without nodularity LYMPH:  no palpable lymphadenopathy in the cervical, axillary  LUNGS: clear to auscultation and percussion with normal breathing effort HEART: regular rate & rhythm and no murmurs and no lower extremity edema ABDOMEN:abdomen soft, distended, non-tender and normal bowel sounds Musculoskeletal:no cyanosis of digits and no clubbing  NEURO: alert & oriented x 3 with fluent speech, no focal motor/sensory deficits  Physical Exam   LABORATORY DATA:  I have reviewed the data as listed    Latest Ref Rng & Units 06/24/2024   10:43 AM 06/03/2024    1:28 PM 05/14/2024    9:02 AM  CBC  WBC 4.0 - 10.5 K/uL 5.6  5.6  6.5   Hemoglobin 13.0 - 17.0 g/dL 88.3  88.1  88.3   Hematocrit 39.0 - 52.0 % 34.2  34.3  34.2   Platelets 150 - 400 K/uL 142  167  166         Latest Ref Rng & Units 06/24/2024   10:43 AM 06/03/2024    1:28 PM 05/14/2024    9:02 AM  CMP  Glucose 70 - 99 mg/dL 751  801  787   BUN 8 - 23 mg/dL 15  15  14    Creatinine 0.61 - 1.24 mg/dL 9.03  9.08  8.98   Sodium 135 - 145 mmol/L 138  140  139   Potassium 3.5 - 5.1 mmol/L 3.8  3.9  4.0   Chloride 98 - 111 mmol/L 105  106  105   CO2 22 - 32 mmol/L 28  30  30    Calcium  8.9 - 10.3 mg/dL 9.0  9.1  9.3   Total Protein 6.5 - 8.1 g/dL 6.7  6.9  6.9   Total Bilirubin 0.0 - 1.2 mg/dL 0.4  0.4  0.4   Alkaline Phos 38 - 126 U/L 54  55  55   AST 15 - 41 U/L 14  13  13    ALT 0 - 44 U/L 13  11  13        RADIOGRAPHIC  STUDIES: I have personally reviewed the radiological images as listed and agreed with the findings in the report. No results found.    Orders Placed This Encounter  Procedures   US  ASCITES (ABDOMEN LIMITED)    Standing Status:   Future    Expected Date:   07/01/2024    Expiration Date:   06/24/2025    Reason for Exam (SYMPTOM  OR DIAGNOSIS REQUIRED):   abdominal blaoting, rule out ascites    Preferred imaging location?:   [facility]   All questions were answered. The patient knows to call the clinic with any problems, questions or concerns. No barriers to learning was detected. The total time spent in the appointment was 40 minutes, including review of chart and various tests results, discussions about plan of care and coordination of care plan     Onita Mattock, MD 06/24/2024

## 2024-06-25 NOTE — Progress Notes (Signed)
 PCP - Massie Sewer, DO Cardiologist - Denies Oncology/Hematology: Dr Onita Mattock  PPM/ICD - denies Device Orders -  Rep Notified -   Chest x-ray -  EKG - 06/25/24 --- Due to new A-Fib results, Anesthesia Allison Zelenak PA-C contacted. To see pt during appt Stress Test - denies ECHO - 06/14/24 Cardiac Cath - denies  Sleep Study - denies CPAP -   Fasting Blood Sugar - 160's Checks Blood Sugar daily CBG today: 265-- had a bowl of cereal this morning  Last dose of GLP1 agonist-  denies GLP1 instructions:   Blood Thinner Instructions: denies Aspirin Instructions:  ERAS Protcol - NPO PRE-SURGERY Ensure or G2-   COVID TEST- no   Anesthesia review: Yes, New A-fib seen on EKG. Pt reports that he experienced weird things with his blood pressure yesterday at his oncology appt. Instead of receiving chemo, he received a 1 Liter NS bolus. Per pt's wife, chemo has been delayed until after surgery.  Patient denies shortness of breath, fever, cough and chest pain at PAT appointment

## 2024-06-25 NOTE — Progress Notes (Addendum)
 Anesthesia APP PAT Evaluation:  Case: [id number] Date/Time: 06/30/24 1115   Procedure: POSTERIOR LUMBAR FUSION 1 LEVEL - PLIF L2-L3   Anesthesia type: General   Diagnosis: Lumbar stenosis with neurogenic claudication [M48.062]   Pre-op diagnosis: lumbar stenosis with neurogenic claudication   [facility]: [facility] ROOM [facility] / [facility]   Surgeons: Gillie Duncans, MD       DISCUSSION: Patient is an 83 year old male scheduled for the above procedure.  History includes former smoker (quit 06/27/2004), HTN, HLD, hypothyroidism, DM2, GERD, bladder cancer (s/p TURBT 07/17/2020), exertional dyspnea, blind right eye, glaucoma (s/p surgery 1998, 2000), fever of unknown origin (~ 03/2024, denied since ~ early 06/2024), spinal surgery (ACDF 2002, 2009; L4-5 PLIF 05/19/2019; L3-4 PLIF 02/10/2020), hard of hearing.   He is followed by urologist Dr. Alvaro and oncologist Dr. Lanny for bladder cancer. Initially diagnosed as stage I high-grade urothelial carcinoma without muscle invasionin 2017. He underwent TURBT on 07/17/2020. Treatments have include intravesicular gemcitabine  and then mitomycin , but had recurrent leakage from intravesical therapy and was switched to Keytruda  on 03/18/2023 with plan to continue Q 3 weeks for up to 2 years. He has a right internal jugular chest Port placed 03/31/2023. He says since his bladder cancer treatments he has had low energy and did not do much activity. He does not think that it has progressed over the year; However, he developed worsening back and leg pain over the past ~ 6 months which has now made his walking to the mailbox (~ 100 yards) difficult.  He has to stop several times for a rest on the way back to his house. He says it is to relieve his leg pain and feeling like they might give out, but his wife says he also can appear flushed with some dyspnea. He went in for a Keytruda  treatment on 06/24/2024, but it was held due to hypotension. He received 1L IVF instead. His wife said he seemed a  bit unsteady when his BP was low, but he denied any feelings of dizziness or presyncope. Dr. Lanny is holding Keytruda  until after his back surgery.    He was referred to ID Dr. Fleeta Rothman on September 2025 for intermittent fevers up to 101-102. He has had two sets of negative blood cultures (last 05/24/2024). QuantiFERON-TB Gold negative. HIV, CRP, ESR, CK, Rheumatoid factor, Protein electrophoresis, ANA negative or within normal range. EBV VCA results suggested past EBV infection, and CMV Ab was positive, but CMV DNA PCR was not detected. Hep A Ab was reactive, and Hep B & C were non-reactive. No leukocytosis. He and and wife think his fevers subsided about 3 weeks ago.  No clear source has been identified. He has not been told there was any concern for port or spinal infection. TTE done as part of his work-up did not show evidence of valvular vegetations. He is scheduled for an abdominal U/S on 06/27/2024 to evaluate for distention.    ID Dr. Fleeta Rothman recommended Mr. Keith [name] get reevaluated by Dr. Gillie to address his spinal stenosis and neurogenic claudication. Symptoms thought to be from nerve compression. MRI showed   I evaluated him at PAT visit due to rate controlled afib on EKG. He has known RBBB. He was asymptomatic. BP 143/76. He denied know history, although TTE on 06/14/2024 showed possible afib. His LVEF was mildly depressed at 45-50% with global hypokinesis, normal RV systolic function, estimated RVSP 33.4 mmHg, trivial MR, no evidence of valvular vegetations on TTE, consider TEE  to exclude infective endocarditis if clinically indicated. He denied chest pain, SOB at rest, orthopnea, presyncope, syncope. Trace ankle edema at the end of the day, stable. He denied palpitations. No known CVA. As above, he is not very active since his bladder cancer diagnosis, but especially in the past ~ 6 months due to back and leg pain and feeling like his legs will give out.  Recent TSH was normal.  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 We discussed his echo and EKG findings, afib and potential treatments. Discussed with anesthesiologist Waddell Domino, MD who agrees with need for preoperative cardiology evaluation. He may ultimately need additional testing and/or anticoagulation therapy, but would defer to cardiology given upcoming surgery. His wife [name] is actually a patient with EP Dr. Danelle Waddell with visit scheduled for 06/25/2024, so they are in favor of trying to get him in with [facility]. I contacted Trish with [facility] to see if she is able to get him scheduled for a new patient/preoperative visit for rate controlled afib with mild LV dysfunction. In the interim, instructed him to go to ED if any new CV symptoms or signs of stroke. His wife Asberry was present during the evaluation.   A1c is in process. Recently CBGs higher. He is on Januvia. Reportedly glimepiride  is being added. They know not to take on the day of surgery.    I have updated Katie at Dr. Shelagh office, and she will send communication to [facility] as well. Chart will be left for follow-up.    ADDENDUM 06/28/2024 10:41 AM: I received a call from [name] after 5:00 PM on 06/25/2024. She said that [facility] scheduling did not have available new patient appointments prior to her husband's 06/30/2024 surgery date. She had been in contact with Dr. Larri nurse Asberry regarding new diagnosis of afib. He also reviewed perioperative DM instructions, although they are aware that elective surgery may be delayed if unable to get seen by cardiology before surgery. Reportedly, Dr. Valentin can send cardiology referral if not done so already by Dr. Gillie. Since then, case is now off the OR schedule for 06/30/2024. I spoke with Izetta at Dr. Shelagh office who indicated patient to get cardiology referral from Dr. Valentin or Dr. Lanny. He now has a  visit scheduled with Dr. Lanny on 06/29/2024. Additional recommendations per his usual medical team. 06/27/2024 abd US  showed no significant ascites.    VS: BP (!) 143/76   Pulse 79   Temp 36.8 C   Resp 17   Ht 5' 10 (1.778 m)   Wt 83.6 kg   SpO2 99%   BMI 26.44 kg/m  Heart irregularly irregular. No murmur noted. No carotid bruits noted. No significant ankle edema. He is hard of hearing.    PROVIDERSBETHA Valentin Skates, DO is PCP  Phone - [phone] Fax - [phone] M-F 8AM - 5PM Lanny Callander, MD is HEM-ONC Fleeta Rothman, Jomarie, MD is ID Alvaro Flavors, MD is urologist   LABS: See DISCUSSION.  (all labs ordered are listed, but only abnormal results are displayed)  Labs Reviewed  GLUCOSE, CAPILLARY - Abnormal; Notable for the following components:      Result Value   Glucose-Capillary 265 (*)    All other components within normal limits  SURGICAL PCR SCREEN  HEMOGLOBIN A1C  TYPE AND SCREEN   Lab Results  Component Value Date   WBC 5.6 06/24/2024   HGB  11.6 (L) 06/24/2024   HCT 34.2 (L) 06/24/2024   PLT 142 (L) 06/24/2024   GLUCOSE 248 (H) 06/24/2024   ALT 13 06/24/2024   AST 14 (L) 06/24/2024   NA 138 06/24/2024   K 3.8 06/24/2024   CL 105 06/24/2024   CREATININE 0.96 06/24/2024   BUN 15 06/24/2024   CO2 28 06/24/2024   TSH 1.560 06/24/2024   TSH 1.560, T4 10.1 on 06/24/2024.   IMAGES: MRI L-spine 05/05/2024: IMPRESSION: 1. Previous anterior and posterior fusion at L3-4 and L4-5 2. Moderate to severe spinal stenosis at L2-3 due to severe degenerative disc disease, disc bulge and facet arthropathy. This is worse compared with the prior study from 04/02/2022 3. Severe degenerative disc disease with facet arthropathy at L5-S1 with severe left and moderate right neural foraminal stenosis unchanged    EKG: 06/25/2024: Ventricular rate 78 bpm Atrial fibrillation with premature ventricular or aberrantly conducted complexes Left axis deviation Right bundle  branch block Abnormal ECG   CV: Echo 06/14/2024: IMPRESSIONS   1. Left ventricular ejection fraction, by estimation, is 45 to 50%. The  left ventricle has mildly decreased function. The left ventricle  demonstrates global hypokinesis. The left ventricular internal cavity size  was mildly dilated. Left ventricular  diastolic parameters are indeterminate.   2. Right ventricular systolic function is normal. The right ventricular  size is normal. There is normal pulmonary artery systolic pressure. The  estimated right ventricular systolic pressure is 33.4 mmHg.   3. The mitral valve is normal in structure. Trivial mitral valve  regurgitation. No evidence of mitral stenosis.   4. The aortic valve is normal in structure. Aortic valve regurgitation is  not visualized. No aortic stenosis is present.   5. The inferior vena cava is normal in size with <50% respiratory  variability, suggesting right atrial pressure of 8 mmHg.   6. Possible atrial fibrillation.  - Conclusion(s)/Recommendation(s): No evidence of valvular vegetations on  this transthoracic echocardiogram. Consider a transesophageal  echocardiogram to exclude infective endocarditis if clinically indicated.    Past Medical History:  Diagnosis Date   Bladder cancer (HCC)    Blind right eye    BPH (benign prostatic hypertrophy)    Cataracts, both eyes    Depression    DM type 2 (diabetes mellitus, type 2) (HCC)    DOE (dyspnea on exertion)    with heavy exertion   Full dentures    FUO (fever of unknown origin) 04/20/2024   GERD (gastroesophageal reflux disease)    Glaucoma no peripheral or side vision   right eye blind   Glaucoma, right eye    HOH (hard of hearing)    Hyperlipidemia    Hypertension    Hypothyroidism    Local recurrence of cancer of urinary bladder [facility] urologsit-  dr matilda   1st dx 01-29-2016--  now recurrent 05-31-2016   Lower extremity edema 07/11/2020   right swells more than left goes down at  night after sleeping   Lower urinary tract symptoms (LUTS) 2019   Lumbar stenosis 05/24/2024   Migraine    Peripheral neuropathy    middle finger left hand   Pneumonia 2019   Skin abnormality    base of spine looks like pimple has had since 07-01-2020, no drainage area intact   Wears glasses     Past Surgical History:  Procedure Laterality Date   ANTERIOR CERVICAL DECOMP/DISCECTOMY FUSION  08-15-2000   C5 -- C6   ANTERIOR CERVICAL DECOMP/DISCECTOMY FUSION  03-04-2008  C6 -- C7   areas removed from back at spine  2021 jan and aug 2021   CYSTOSCOPY W/ RETROGRADES Left 05/11/2020   Procedure: Cystoscopy with left retrograde ureteropyelogram, resection of probable bladder tumor and left trigonal/ureteral orifice region, 3 cm in size, fluoroscopic interpretation, left ureteroscopy, placement of 6 French by 26 cm contour double-J stent without tether;  Surgeon: Matilda Senior, MD;  Location: [facility];  Service: Urology;  Laterality: Left;   CYSTOSCOPY W/ RETROGRADES Bilateral 06/24/2022   Procedure: CYSTOSCOPY WITH RETROGRADE PYELOGRAM;  Surgeon: Matilda Senior, MD;  Location: [facility];  Service: Urology;  Laterality: Bilateral;  1 HR   CYSTOSCOPY WITH BIOPSY N/A 01/29/2016   Procedure: CYSTOSCOPY WITH BIOPSY;  Surgeon: Senior Matilda, MD;  Location: [facility];  Service: Urology;  Laterality: N/A;   CYSTOSCOPY WITH BIOPSY N/A 05/31/2016   Procedure: CYSTOSCOPY WITH BIOPSY;  Surgeon: Senior Matilda, MD;  Location: [facility];  Service: Urology;  Laterality: N/A;   CYSTOSCOPY WITH BIOPSY N/A 06/24/2022   Procedure: CYSTOSCOPY WITH BIOPSY;  Surgeon: Matilda Senior, MD;  Location: [facility];  Service: Urology;  Laterality: N/A;   EYE SURGERY Right 1998 & 2000   Glu[place]   GLAUCOMA SURGERY  1998 and 2000   IR IMAGING GUIDED PORT INSERTION  03/31/2023   lower back  06/01/2019, 02-10-2020   nose cancer  areas removed  2019, 2021   x 2  / 3/ 2021 small area removed from nose   surgery for sacral fracture  40 yrs ago   TRANSURETHRAL RESECTION OF BLADDER TUMOR N/A 07/17/2020   Procedure: TRANSURETHRAL RESECTION OF BLADDER TUMOR (TURBT);  Surgeon: Matilda Senior, MD;  Location: [facility];  Service: Urology;  Laterality: N/A;    MEDICATIONS:  acetaminophen  (TYLENOL ) 650 MG CR tablet   amLODipine  (NORVASC ) 10 MG tablet   atorvastatin  (LIPITOR) 20 MG tablet   Blood Glucose Monitoring Suppl (ACCU-CHEK AVIVA PLUS) w/Device KIT   carteolol (OCUPRESS) 1 % ophthalmic solution   cyanocobalamin  (VITAMIN B12) 1000 MCG tablet   glucose blood (ACCU-CHEK AVIVA PLUS) test strip   latanoprost (XALATAN) 0.005 % ophthalmic solution   levothyroxine  (SYNTHROID , LEVOTHROID) 100 MCG tablet   loratadine  (CLARITIN ) 10 MG tablet   losartan  (COZAAR ) 100 MG tablet   meloxicam (MOBIC) 15 MG tablet   omeprazole (PRILOSEC) 20 MG capsule   ondansetron  (ZOFRAN ) 8 MG tablet   OVER THE COUNTER MEDICATION   oxymetazoline (AFRIN) 0.05 % nasal spray   propranolol  (INDERAL ) 20 MG tablet   sitaGLIPtin (JANUVIA) 100 MG tablet   tamsulosin  (FLOMAX ) 0.4 MG CAPS capsule   terbinafine  (LAMISIL ) 250 MG tablet   No current facility-administered medications for this encounter.    Isaiah Ruder, PA-C Surgical Short Stay/Anesthesiology [facility] Phone [phone] [facility] Phone [phone] 06/25/2024 12:39 PM

## 2024-06-27 ENCOUNTER — Other Ambulatory Visit: Payer: Self-pay

## 2024-06-27 ENCOUNTER — Ambulatory Visit (HOSPITAL_COMMUNITY)
Admission: RE | Admit: 2024-06-27 | Discharge: 2024-06-27 | Disposition: A | Source: Ambulatory Visit | Attending: Hematology

## 2024-06-27 ENCOUNTER — Emergency Department (HOSPITAL_COMMUNITY): Admission: EM | Admit: 2024-06-27

## 2024-06-27 DIAGNOSIS — R140 Abdominal distension (gaseous): Secondary | ICD-10-CM | POA: Diagnosis not present

## 2024-06-27 DIAGNOSIS — C679 Malignant neoplasm of bladder, unspecified: Secondary | ICD-10-CM | POA: Insufficient documentation

## 2024-06-28 NOTE — Assessment & Plan Note (Signed)
-  Stage I, no muscular invasive, initially diagnosed in 2017  -He was found to have high-grade urothelial carcinoma without muscle invasion and developed and subsequently BCG refractory disease in December 2021. He received Intravesicular gemcitabine  with a total of 2000 mg weekly X6 started in February 2022  -bladder biopsies, bladder washings on 06/24/2022 showed focal urothelial carcinoma in situ (CIS) and cytology was positive for high grade urothelial carcinoma -I recommended intravesical gemcitabine  2000 mg weekly x 6, he started on 08/28/16.  He tolerated first cycle poorly with significant hematuria, and intermittent fever for week after treatment. -Due to his poor tolerance to intravesical gemcitabine , I changed therapy to intravesical mitomycin  40 mg every week for 5 weeks. He tolerated the treatment very well  -His surveillance cystoscopy and biopsy were negative for residual cancer -Given his good response to intravesical mitomycin , and his high risk of recurrence, Dr. Matilda recommend small intravesical therapy.  Patient agrees to proceed, started in in April 2024. Unfortunately he has had recurrent leakage from the intravesical therapy, and not able to continue. -Treatment was switched to Keytruda  on March 18, 2023, he has been tolerating well overall.  Plan to continue every 3 weeks for up to 2 years if no recurrence

## 2024-06-29 ENCOUNTER — Ambulatory Visit: Attending: Cardiovascular Disease | Admitting: Cardiovascular Disease

## 2024-06-29 ENCOUNTER — Ambulatory Visit: Admitting: Podiatry

## 2024-06-29 ENCOUNTER — Inpatient Hospital Stay (HOSPITAL_BASED_OUTPATIENT_CLINIC_OR_DEPARTMENT_OTHER): Admitting: Hematology

## 2024-06-29 DIAGNOSIS — C679 Malignant neoplasm of bladder, unspecified: Secondary | ICD-10-CM

## 2024-06-29 DIAGNOSIS — C671 Malignant neoplasm of dome of bladder: Secondary | ICD-10-CM

## 2024-06-29 DIAGNOSIS — Z5112 Encounter for antineoplastic immunotherapy: Secondary | ICD-10-CM | POA: Diagnosis not present

## 2024-06-29 NOTE — Progress Notes (Signed)
 [facility]   [phone] [phone]   Clinic Follow up Note   Patient Care Team: Valentin Skates, DO as PCP - General (Internal Medicine) Lanny Callander, MD as Attending Physician (Hematology and Oncology) Matilda Senior, MD as Consulting Physician (Urology) 06/29/2024  I connected with Keith [name] on 06/29/24 at  7:45 AM EST by telephone and verified that I am speaking with the correct person using two identifiers.   I discussed the limitations, risks, security and privacy concerns of performing an evaluation and management service by telephone and the availability of in person appointments. I also discussed with the patient that there may be a patient responsible charge related to this service. The patient expressed understanding and agreed to proceed.   Patient's location:  Home  Provider's location:  Office    CHIEF COMPLAINT: f/u bladder cancer   CURRENT THERAPY: Keytruda  every 3 weeks  Oncology history Bladder cancer (HCC) -Stage I, no muscular invasive, initially diagnosed in 2017  -He was found to have high-grade urothelial carcinoma without muscle invasion and developed and subsequently BCG refractory disease in December 2021. He received Intravesicular gemcitabine  with a total of 2000 mg weekly X6 started in February 2022  -bladder biopsies, bladder washings on 06/24/2022 showed focal urothelial carcinoma in situ (CIS) and cytology was positive for high grade urothelial carcinoma -I recommended intravesical gemcitabine  2000 mg weekly x 6, he started on 08/28/16.  He tolerated first cycle poorly with significant hematuria, and intermittent fever for week after treatment. -Due to his poor tolerance to intravesical gemcitabine , I changed therapy to intravesical mitomycin  40 mg every week for 5 weeks. He tolerated the treatment very well  -His surveillance cystoscopy and biopsy were negative for residual cancer -Given his good response to  intravesical mitomycin , and his high risk of recurrence, Dr. Matilda recommend small intravesical therapy.  Patient agrees to proceed, started in in April 2024. Unfortunately he has had recurrent leakage from the intravesical therapy, and not able to continue. -Treatment was switched to Keytruda  on March 18, 2023, he has been tolerating well overall.  Plan to continue every 3 weeks for up to 2 years if no recurrence  Assessment & Plan Bladder cancer -Treatment held last week due to borderline hypotension, will reschedule treatment next week. Back surgery postponed due to hypotension and CHF. No delay required between treatment and back surgery. - Will reschedule bladder cancer treatment for next week - Will coordinate with scheduler to find a suitable time for treatment  Hypotension and associated dizziness Hypotension with episodes of dizziness upon standing. Blood pressure was low last week and at home. Referral to cardiologist made for further evaluation of heart condition, including atrial fibrillation and posterior heart issues. - Check blood pressure at home every other day - Follow up with cardiologist for evaluation of heart condition  CHF - Found on recent echo - He is PCP has referred him to cardiology  Plan - His diarrhea has resolved, overall feels better than last week - Will reschedule his Keytruda  treatment to later this week or next week - Follow-up before treatment. - I discussed that his ultrasound of abdomen result, which was benign.    SUMMARY OF ONCOLOGIC HISTORY: Oncology History Overview Note   Cancer Staging  Bladder cancer [facility]) Staging form: Urinary Bladder, AJCC 8th Edition - Clinical: Stage I (cT1, cN0, cM0) - Signed by Amadeo Windell SAILOR, MD on 09/12/2020 WHO/ISUP grade (low/high): High Grade Histologic grading system: 2 grade system - Pathologic  stage from 06/24/2022: Stage 0is (pTis, pN0, cM0) - Signed by Lanny Callander, MD on 08/24/2022 Stage prefix: Initial  diagnosis WHO/ISUP grade (low/high): High Grade Histologic grading system: 2 grade system     Bladder cancer (HCC)  09/12/2020 Initial Diagnosis   Bladder cancer (HCC)   09/12/2020 Cancer Staging   Staging form: Urinary Bladder, AJCC 8th Edition - Clinical: Stage I (cT1, cN0, cM0) - Signed by Amadeo Windell SAILOR, MD on 09/12/2020 WHO/ISUP grade (low/high): High Grade Histologic grading system: 2 grade system   06/24/2022 Pathology Results             FINAL MICROSCOPIC DIAGNOSIS:  A. BLADDER, BIOPSY: Focal urothelial carcinoma in situ (CIS)     06/24/2022 Cancer Staging   Staging form: Urinary Bladder, AJCC 8th Edition - Pathologic stage from 06/24/2022: Stage 0is (pTis, pN0, cM0) - Signed by Lanny Callander, MD on 08/24/2022 Stage prefix: Initial diagnosis WHO/ISUP grade (low/high): High Grade Histologic grading system: 2 grade system   08/28/2022 - 08/28/2022 Chemotherapy   Patient is on Treatment Plan : BLADDER Gemcitabine  INTRAVESICAL (2000) q7d     09/12/2022 - 02/26/2023 Chemotherapy   Patient is on Treatment Plan : BLADDER Mitomycin  INTRAVESICAL (40 mg) q7d     10/25/2022 Genetic Testing   Negative [facility] CancerNext-Expanded +RNA Panel.  Report date is 10/25/2022.   The CancerNext-Expanded gene panel offered by [facility] and includes sequencing, rearrangement, and RNA analysis for the following 77 genes: AIP, ALK, APC, ATM, AXIN2, BAP1, BARD1, BLM, BMPR1A, BRCA1, BRCA2, BRIP1, CDC73, CDH1, CDK4, CDKN1B, CDKN2A, CHEK2, CTNNA1, DICER1, FANCC, FH, FLCN, GALNT12, KIF1B, LZTR1, MAX, MEN1, MET, MLH1, MSH2, MSH3, MSH6, MUTYH, NBN, NF1, NF2, NTHL1, PALB2, PHOX2B, PMS2, POT1, PRKAR1A, PTCH1, PTEN, RAD51C, RAD51D, RB1, RECQL, RET, SDHA, SDHAF2, SDHB, SDHC, SDHD, SMAD4, SMARCA4, SMARCB1, SMARCE1, STK11, SUFU, TMEM127, TP53, TSC1, TSC2, VHL and XRCC2 (sequencing and deletion/duplication); EGFR, EGLN1, HOXB13, KIT, MITF, PDGFRA, POLD1, and POLE (sequencing only); EPCAM and GREM1  (deletion/duplication only).    03/18/2023 -  Chemotherapy   Patient is on Treatment Plan : BLADDER Pembrolizumab  (200) q21d     Malignant neoplasm of trigone of urinary bladder (HCC)  09/12/2020 Initial Diagnosis   Malignant neoplasm of trigone of urinary bladder (HCC)   09/26/2020 - 10/31/2020 Chemotherapy   Patient is on Treatment Plan : BLADDER Gemcitabine  q7d     04/06/2024 Imaging   CT CAP with contrast  IMPRESSION: 1. No acute CT findings of the chest, abdomen, or pelvis to explain fever. 2. No evidence of bladder mass. No evidence of lymphadenopathy or metastatic disease in the chest, abdomen, or pelvis. 3. Prostatomegaly. 4. Punctuate nonobstructive calculus of the superior pole of the left kidney. No right-sided calculi, ureteral calculi, or hydronephrosis. 5. Emphysema and diffuse bilateral bronchial wall thickening. 6. Coronary artery disease.   04/08/2024 Imaging   CT CAP with contrast IMPRESSION: 1. No acute CT findings of the chest, abdomen, or pelvis to explain fever. 2. No evidence of bladder mass. No evidence of lymphadenopathy or metastatic disease in the chest, abdomen, or pelvis. 3. Prostatomegaly. 4. Punctuate nonobstructive calculus of the superior pole of the left kidney. No right-sided calculi, ureteral calculi, or hydronephrosis. 5. Emphysema and diffuse bilateral bronchial wall thickening. 6. Coronary artery disease.     Discussed the use of AI scribe software for clinical note transcription with the patient, who gave verbal consent to proceed.  History of Present Illness Keith [name] is an 83 year old male with bladder cancer who presents  for a follow-up visit. He is accompanied by Asberry, his caregiver. He was referred by Dr. Calvin nurse for a cardiology evaluation due to a heart condition.  He feels generally unwell and experiences back pain. His scheduled back surgery has been canceled. He has a history of atrial fibrillation and a cardiac  issue. His blood pressure was low last Friday. He experiences occasional dizziness upon standing, which resolves shortly after.     REVIEW OF SYSTEMS:   Constitutional: Denies fevers, chills or abnormal weight loss Eyes: Denies blurriness of vision Ears, nose, mouth, throat, and face: Denies mucositis or sore throat Respiratory: Denies cough, dyspnea or wheezes Cardiovascular: Denies palpitation, chest discomfort or lower extremity swelling Gastrointestinal:  Denies nausea, heartburn or change in bowel habits Skin: Denies abnormal skin rashes Lymphatics: Denies new lymphadenopathy or easy bruising Neurological:Denies numbness, tingling or new weaknesses Behavioral/Psych: Mood is stable, no new changes  All other systems were reviewed with the patient and are negative.  MEDICAL HISTORY:  Past Medical History:  Diagnosis Date   Bladder cancer (HCC)    Blind right eye    BPH (benign prostatic hypertrophy)    Cataracts, both eyes    Depression    DM type 2 (diabetes mellitus, type 2) (HCC)    DOE (dyspnea on exertion)    with heavy exertion   Full dentures    FUO (fever of unknown origin) 04/20/2024   GERD (gastroesophageal reflux disease)    Glaucoma no peripheral or side vision   right eye blind   Glaucoma, right eye    HOH (hard of hearing)    Hyperlipidemia    Hypertension    Hypothyroidism    Local recurrence of cancer of urinary bladder (HCC) urologsit-  dr matilda   1st dx 01-29-2016--  now recurrent 05-31-2016   Lower extremity edema 07/11/2020   right swells more than left goes down at night after sleeping   Lower urinary tract symptoms (LUTS) 2019   Lumbar stenosis 05/24/2024   Migraine    Peripheral neuropathy    middle finger left hand   Pneumonia 2019   Skin abnormality    base of spine looks like pimple has had since 07-01-2020, no drainage area intact   Wears glasses     SURGICAL HISTORY: Past Surgical History:  Procedure Laterality Date    ANTERIOR CERVICAL DECOMP/DISCECTOMY FUSION  08-15-2000   C5 -- C6   ANTERIOR CERVICAL DECOMP/DISCECTOMY FUSION  03-04-2008   C6 -- C7   areas removed from back at spine  2021 jan and aug 2021   CYSTOSCOPY W/ RETROGRADES Left 05/11/2020   Procedure: Cystoscopy with left retrograde ureteropyelogram, resection of probable bladder tumor and left trigonal/ureteral orifice region, 3 cm in size, fluoroscopic interpretation, left ureteroscopy, placement of 6 French by 26 cm contour double-J stent without tether;  Surgeon: Matilda Senior, MD;  Location: [facility];  Service: Urology;  Laterality: Left;   CYSTOSCOPY W/ RETROGRADES Bilateral 06/24/2022   Procedure: CYSTOSCOPY WITH RETROGRADE PYELOGRAM;  Surgeon: Matilda Senior, MD;  Location: [facility];  Service: Urology;  Laterality: Bilateral;  1 HR   CYSTOSCOPY WITH BIOPSY N/A 01/29/2016   Procedure: CYSTOSCOPY WITH BIOPSY;  Surgeon: Senior Matilda, MD;  Location: [facility];  Service: Urology;  Laterality: N/A;   CYSTOSCOPY WITH BIOPSY N/A 05/31/2016   Procedure: CYSTOSCOPY WITH BIOPSY;  Surgeon: Senior Matilda, MD;  Location: [facility];  Service: Urology;  Laterality: N/A;   CYSTOSCOPY WITH BIOPSY N/A  06/24/2022   Procedure: CYSTOSCOPY WITH BIOPSY;  Surgeon: Matilda Senior, MD;  Location: [facility];  Service: Urology;  Laterality: N/A;   EYE SURGERY Right 1998 & 2000   [place]   GLAUCOMA SURGERY  1998 and 2000   IR IMAGING GUIDED PORT INSERTION  03/31/2023   lower back  06/01/2019, 02-10-2020   nose cancer  areas removed  2019, 2021   x 2 / 3/ 2021 small area removed from nose   surgery for sacral fracture  40 yrs ago   TRANSURETHRAL RESECTION OF BLADDER TUMOR N/A 07/17/2020   Procedure: TRANSURETHRAL RESECTION OF BLADDER TUMOR (TURBT);  Surgeon: Matilda Senior, MD;  Location: [facility];  Service: Urology;  Laterality: N/A;    I have reviewed the social history and  family history with the patient and they are unchanged from previous note.  ALLERGIES:  has no known allergies.  MEDICATIONS:  Current Outpatient Medications  Medication Sig Dispense Refill   acetaminophen  (TYLENOL ) 650 MG CR tablet Take 1,300 mg by mouth every 8 (eight) hours as needed for pain.     amLODipine  (NORVASC ) 10 MG tablet Take 10 mg by mouth daily.     atorvastatin  (LIPITOR) 20 MG tablet Take 20 mg by mouth at bedtime.      Blood Glucose Monitoring Suppl (ACCU-CHEK AVIVA PLUS) w/Device KIT To check blood sugar vitro three times a day; Duration: 90 days     carteolol (OCUPRESS) 1 % ophthalmic solution Place 1 drop into the right eye every morning.     cyanocobalamin  (VITAMIN B12) 1000 MCG tablet Take 1,000 mcg by mouth daily.     glucose blood (ACCU-CHEK AVIVA PLUS) test strip To check blood sugars In Vitro two times a day; Duration: 90 days     latanoprost (XALATAN) 0.005 % ophthalmic solution Place 1 drop into the right eye at bedtime.     levothyroxine  (SYNTHROID , LEVOTHROID) 100 MCG tablet Take 100 mcg by mouth daily before breakfast.      loratadine  (CLARITIN ) 10 MG tablet Take 10 mg by mouth daily as needed for allergies.     losartan  (COZAAR ) 100 MG tablet Take 100 mg by mouth at bedtime.      meloxicam (MOBIC) 15 MG tablet Take 15 mg by mouth daily as needed for pain.     omeprazole (PRILOSEC) 20 MG capsule Take 20 mg by mouth daily.     ondansetron  (ZOFRAN ) 8 MG tablet Take 1 tablet (8 mg total) by mouth every 8 (eight) hours as needed for nausea or vomiting. 30 tablet 1   OVER THE COUNTER MEDICATION Take 2 capsules by mouth daily. OmegaXL (perna canaliculus oil + PCSO-524)     oxymetazoline (AFRIN) 0.05 % nasal spray Place 1 spray into both nostrils 2 (two) times daily.     propranolol  (INDERAL ) 20 MG tablet TAKE 1 TABLET BY MOUTH TWICE A DAY 180 tablet 1   sitaGLIPtin (JANUVIA) 100 MG tablet Take 100 mg by mouth every morning.     tamsulosin  (FLOMAX ) 0.4 MG CAPS capsule  Take 0.4 mg by mouth every morning.      terbinafine  (LAMISIL ) 250 MG tablet Take 1 tablet (250 mg total) by mouth daily. 30 tablet 3   No current facility-administered medications for this visit.    PHYSICAL EXAMINATION: Not performed   LABORATORY DATA:  I have reviewed the data as listed    Latest Ref Rng & Units 06/24/2024   10:43 AM 06/03/2024    1:28 PM 05/14/2024  9:02 AM  CBC  WBC 4.0 - 10.5 K/uL 5.6  5.6  6.5   Hemoglobin 13.0 - 17.0 g/dL 88.3  88.1  88.3   Hematocrit 39.0 - 52.0 % 34.2  34.3  34.2   Platelets 150 - 400 K/uL 142  167  166         Latest Ref Rng & Units 06/24/2024   10:43 AM 06/03/2024    1:28 PM 05/14/2024    9:02 AM  CMP  Glucose 70 - 99 mg/dL 751  801  787   BUN 8 - 23 mg/dL 15  15  14    Creatinine 0.61 - 1.24 mg/dL 9.03  9.08  8.98   Sodium 135 - 145 mmol/L 138  140  139   Potassium 3.5 - 5.1 mmol/L 3.8  3.9  4.0   Chloride 98 - 111 mmol/L 105  106  105   CO2 22 - 32 mmol/L 28  30  30    Calcium  8.9 - 10.3 mg/dL 9.0  9.1  9.3   Total Protein 6.5 - 8.1 g/dL 6.7  6.9  6.9   Total Bilirubin 0.0 - 1.2 mg/dL 0.4  0.4  0.4   Alkaline Phos 38 - 126 U/L 54  55  55   AST 15 - 41 U/L 14  13  13    ALT 0 - 44 U/L 13  11  13        RADIOGRAPHIC STUDIES: I have personally reviewed the radiological images as listed and agreed with the findings in the report. US  ASCITES (ABDOMEN LIMITED) Result Date: 06/27/2024 CLINICAL DATA:  Abdominal bloating and distension, malignant neoplasm of the urinary bladder, assess for ascites EXAM: LIMITED ABDOMEN ULTRASOUND FOR ASCITES TECHNIQUE: Limited ultrasound survey for ascites was performed in all four abdominal quadrants. COMPARISON:  04/06/2024 CT with contrast FINDINGS: Survey of the abdominal 4 quadrants demonstrates no significant abdominopelvic ascites by ultrasound. IMPRESSION: No significant ascites. Electronically Signed   By: CHRISTELLA.  Shick M.D.   On: 06/27/2024 20:34       I discussed the assessment and  treatment plan with the patient. The patient was provided an opportunity to ask questions and all were answered. The patient agreed with the plan and demonstrated an understanding of the instructions.   The patient was advised to call back or seek an in-person evaluation if the symptoms worsen or if the condition fails to improve as anticipated.  I provided 15 minutes of non face-to-face telephone visit time during this encounter, including review of chart and various tests results, discussions about plan of care and coordination of care plan.    Onita Mattock, MD 06/29/24

## 2024-06-30 ENCOUNTER — Ambulatory Visit (HOSPITAL_COMMUNITY): Admit: 2024-06-30 | Admitting: Neurosurgery

## 2024-06-30 SURGERY — POSTERIOR LUMBAR FUSION 1 LEVEL
Anesthesia: General

## 2024-07-01 ENCOUNTER — Other Ambulatory Visit: Payer: Self-pay

## 2024-07-01 ENCOUNTER — Telehealth: Payer: Self-pay

## 2024-07-01 NOTE — Telephone Encounter (Signed)
 Pt's wife called stating that the pt missed his appt with the cardiologist on 06/29/2024.  Pt's wife stated that the pt was rescheduled on 07/27/2024 with the cardiologist and now wants to know if Dr Lanny still wants the pt to get his Pembrolizumab  on 07/05/2024 since he has not seen his cardiologist.  Stated this nurse will notify Dr Lanny of the pt and wife's concerns.

## 2024-07-02 NOTE — Telephone Encounter (Signed)
 Spoke with pt's wife to inform pt's wife that Dr Lanny would like to the pt to keep his appts as scheduled for Pembrolizumab .  Stated Dr Lanny does not think the Pembrolizumab  have anything to do with the pt's cardiac issues.  Pt's wife verbalized understanding and had no further questions or concerns at this time.

## 2024-07-04 NOTE — Assessment & Plan Note (Signed)
-  Stage I, no muscular invasive, initially diagnosed in 2017  -He was found to have high-grade urothelial carcinoma without muscle invasion and developed and subsequently BCG refractory disease in December 2021. He received Intravesicular gemcitabine  with a total of 2000 mg weekly X6 started in February 2022  -bladder biopsies, bladder washings on 06/24/2022 showed focal urothelial carcinoma in situ (CIS) and cytology was positive for high grade urothelial carcinoma -I recommended intravesical gemcitabine  2000 mg weekly x 6, he started on 08/28/16.  He tolerated first cycle poorly with significant hematuria, and intermittent fever for week after treatment. -Due to his poor tolerance to intravesical gemcitabine , I changed therapy to intravesical mitomycin  40 mg every week for 5 weeks. He tolerated the treatment very well  -His surveillance cystoscopy and biopsy were negative for residual cancer -Given his good response to intravesical mitomycin , and his high risk of recurrence, Dr. Matilda recommend small intravesical therapy.  Patient agrees to proceed, started in in April 2024. Unfortunately he has had recurrent leakage from the intravesical therapy, and not able to continue. -Treatment was switched to Keytruda  on March 18, 2023, he has been tolerating well overall.  Plan to continue every 3 weeks for up to 2 years if no recurrence - His last cystoscopy was negative in October 2025.  Next due in April 2026

## 2024-07-05 ENCOUNTER — Encounter: Payer: Self-pay | Admitting: Hematology

## 2024-07-05 ENCOUNTER — Inpatient Hospital Stay

## 2024-07-05 ENCOUNTER — Inpatient Hospital Stay: Admitting: Hematology

## 2024-07-05 VITALS — BP 128/66 | HR 70 | Temp 98.5°F | Resp 16 | Ht 70.0 in | Wt 182.2 lb

## 2024-07-05 DIAGNOSIS — C679 Malignant neoplasm of bladder, unspecified: Secondary | ICD-10-CM

## 2024-07-05 DIAGNOSIS — C671 Malignant neoplasm of dome of bladder: Secondary | ICD-10-CM

## 2024-07-05 DIAGNOSIS — Z5112 Encounter for antineoplastic immunotherapy: Secondary | ICD-10-CM | POA: Diagnosis not present

## 2024-07-05 LAB — CMP (CANCER CENTER ONLY)
ALT: 21 U/L (ref 0–44)
AST: 21 U/L (ref 15–41)
Albumin: 4.4 g/dL (ref 3.5–5.0)
Alkaline Phosphatase: 63 U/L (ref 38–126)
Anion gap: 9 (ref 5–15)
BUN: 13 mg/dL (ref 8–23)
CO2: 25 mmol/L (ref 22–32)
Calcium: 9.3 mg/dL (ref 8.9–10.3)
Chloride: 102 mmol/L (ref 98–111)
Creatinine: 0.91 mg/dL (ref 0.61–1.24)
GFR, Estimated: >60 mL/min (ref >60)
Glucose, Bld: 170 mg/dL — ABNORMAL HIGH (ref 70–99)
Potassium: 4.1 mmol/L (ref 3.5–5.1)
Sodium: 137 mmol/L (ref 135–145)
Total Bilirubin: 0.3 mg/dL (ref 0.0–1.2)
Total Protein: 7.2 g/dL (ref 6.5–8.1)

## 2024-07-05 LAB — CBC WITH DIFFERENTIAL (CANCER CENTER ONLY)
Abs Immature Granulocytes: 0.03 K/uL (ref 0.00–0.07)
Basophils Absolute: 0.1 K/uL (ref 0.0–0.1)
Basophils Relative: 1 %
Eosinophils Absolute: 0.3 K/uL (ref 0.0–0.5)
Eosinophils Relative: 6 %
HCT: 35.4 % — ABNORMAL LOW (ref 39.0–52.0)
Hemoglobin: 12 g/dL — ABNORMAL LOW (ref 13.0–17.0)
Immature Granulocytes: 1 %
Lymphocytes Relative: 22 %
Lymphs Abs: 1.3 K/uL (ref 0.7–4.0)
MCH: 30.2 pg (ref 26.0–34.0)
MCHC: 33.9 g/dL (ref 30.0–36.0)
MCV: 88.9 fL (ref 80.0–100.0)
Monocytes Absolute: 0.5 K/uL (ref 0.1–1.0)
Monocytes Relative: 9 %
Neutro Abs: 3.6 K/uL (ref 1.7–7.7)
Neutrophils Relative %: 61 %
Platelet Count: 158 K/uL (ref 150–400)
RBC: 3.98 MIL/uL — ABNORMAL LOW (ref 4.22–5.81)
RDW: 12.9 % (ref 11.5–15.5)
WBC Count: 5.9 K/uL (ref 4.0–10.5)
nRBC: 0 % (ref 0.0–0.2)

## 2024-07-05 LAB — TSH: TSH: 1.25 u[IU]/mL (ref 0.350–4.500)

## 2024-07-05 MED ORDER — SODIUM CHLORIDE 0.9 % IV SOLN: Freq: Once | INTRAVENOUS | Status: AC

## 2024-07-05 MED ORDER — SODIUM CHLORIDE 0.9 % IV SOLN
200.0000 mg | Freq: Once | INTRAVENOUS | Status: AC
  Administered 2024-07-05: 200 mg via INTRAVENOUS
  Filled 2024-07-05: qty 8

## 2024-07-05 NOTE — Patient Instructions (Signed)

## 2024-07-05 NOTE — Progress Notes (Signed)
 [facility]   [phone] [phone]   Clinic Follow up Note   Patient Care Team: Valentin Skates, DO as PCP - General (Internal Medicine) Lanny Callander, MD as Attending Physician (Hematology and Oncology) Matilda Senior, MD as Consulting Physician (Urology)  Date of Service:  07/05/2024  CHIEF COMPLAINT: f/u of bladder cancer  CURRENT THERAPY:  Keytruda  every 3 weeks  Oncology History   Bladder cancer (HCC) -Stage I, no muscular invasive, initially diagnosed in 2017  -He was found to have high-grade urothelial carcinoma without muscle invasion and developed and subsequently BCG refractory disease in December 2021. He received Intravesicular gemcitabine  with a total of 2000 mg weekly X6 started in February 2022  -bladder biopsies, bladder washings on 06/24/2022 showed focal urothelial carcinoma in situ (CIS) and cytology was positive for high grade urothelial carcinoma -I recommended intravesical gemcitabine  2000 mg weekly x 6, he started on 08/28/16.  He tolerated first cycle poorly with significant hematuria, and intermittent fever for week after treatment. -Due to his poor tolerance to intravesical gemcitabine , I changed therapy to intravesical mitomycin  40 mg every week for 5 weeks. He tolerated the treatment very well  -His surveillance cystoscopy and biopsy were negative for residual cancer -Given his good response to intravesical mitomycin , and his high risk of recurrence, Dr. Matilda recommend small intravesical therapy.  Patient agrees to proceed, started in in April 2024. Unfortunately he has had recurrent leakage from the intravesical therapy, and not able to continue. -Treatment was switched to Keytruda  on March 18, 2023, he has been tolerating well overall.  Plan to continue every 3 weeks for up to 2 years if no recurrence - His last cystoscopy was negative in October 2025.  Next due in April 2026  Assessment & Plan Bladder cancer under  active treatment Bladder cancer is being actively treated with a plan to continue treatment for two years if the next endoscopy is negative. The last cystoscopy was in October, and the next one is scheduled for April. Treatment started in August 2024. There was a delay of one week in the last treatment, but it is not significant. He is receiving assistance from the [employer] for medication costs. - Continue current treatment regimen. - Scheduled next cystoscopy for April. - Coordinated with pharmacy to renew assistance for medication costs. - Scheduled next infusion for December 16th at 8:30 AM.  Fatigue Persistent fatigue, consistent with previous reports.  Abnormal weight loss Weight has decreased by two pounds, now at 182 pounds. No significant swelling noted.   Plan - He is clinically stable, blood pressure has been normal lately, diarrhea resolved.  Lab reviewed, will proceed Keytruda  today and continue every 3 weeks - He is scheduled to see his cardiologist in mid December - Follow-up in 3 weeks before next treatment  SUMMARY OF ONCOLOGIC HISTORY: Oncology History Overview Note   Cancer Staging  Bladder cancer [facility]) Staging form: Urinary Bladder, AJCC 8th Edition - Clinical: Stage I (cT1, cN0, cM0) - Signed by Amadeo Windell SAILOR, MD on 09/12/2020 WHO/ISUP grade (low/high): High Grade Histologic grading system: 2 grade system - Pathologic stage from 06/24/2022: Stage 0is (pTis, pN0, cM0) - Signed by Lanny Callander, MD on 08/24/2022 Stage prefix: Initial diagnosis WHO/ISUP grade (low/high): High Grade Histologic grading system: 2 grade system     Bladder cancer (HCC)  09/12/2020 Initial Diagnosis   Bladder cancer (HCC)   09/12/2020 Cancer Staging   Staging form: Urinary Bladder, AJCC 8th Edition - Clinical: Stage I (  cT1, cN0, cM0) - Signed by Amadeo Windell SAILOR, MD on 09/12/2020 WHO/ISUP grade (low/high): High Grade Histologic grading system: 2 grade system   06/24/2022 Pathology  Results             FINAL MICROSCOPIC DIAGNOSIS:  A. BLADDER, BIOPSY: Focal urothelial carcinoma in situ (CIS)     06/24/2022 Cancer Staging   Staging form: Urinary Bladder, AJCC 8th Edition - Pathologic stage from 06/24/2022: Stage 0is (pTis, pN0, cM0) - Signed by Lanny Callander, MD on 08/24/2022 Stage prefix: Initial diagnosis WHO/ISUP grade (low/high): High Grade Histologic grading system: 2 grade system   08/28/2022 - 08/28/2022 Chemotherapy   Patient is on Treatment Plan : BLADDER Gemcitabine  INTRAVESICAL (2000) q7d     09/12/2022 - 02/26/2023 Chemotherapy   Patient is on Treatment Plan : BLADDER Mitomycin  INTRAVESICAL (40 mg) q7d     10/25/2022 Genetic Testing   Negative [facility] CancerNext-Expanded +RNA Panel.  Report date is 10/25/2022.   The CancerNext-Expanded gene panel offered by [facility] and includes sequencing, rearrangement, and RNA analysis for the following 77 genes: AIP, ALK, APC, ATM, AXIN2, BAP1, BARD1, BLM, BMPR1A, BRCA1, BRCA2, BRIP1, CDC73, CDH1, CDK4, CDKN1B, CDKN2A, CHEK2, CTNNA1, DICER1, FANCC, FH, FLCN, GALNT12, KIF1B, LZTR1, MAX, MEN1, MET, MLH1, MSH2, MSH3, MSH6, MUTYH, NBN, NF1, NF2, NTHL1, PALB2, PHOX2B, PMS2, POT1, PRKAR1A, PTCH1, PTEN, RAD51C, RAD51D, RB1, RECQL, RET, SDHA, SDHAF2, SDHB, SDHC, SDHD, SMAD4, SMARCA4, SMARCB1, SMARCE1, STK11, SUFU, TMEM127, TP53, TSC1, TSC2, VHL and XRCC2 (sequencing and deletion/duplication); EGFR, EGLN1, HOXB13, KIT, MITF, PDGFRA, POLD1, and POLE (sequencing only); EPCAM and GREM1 (deletion/duplication only).    03/18/2023 -  Chemotherapy   Patient is on Treatment Plan : BLADDER Pembrolizumab  (200) q21d     Malignant neoplasm of trigone of urinary bladder (HCC)  09/12/2020 Initial Diagnosis   Malignant neoplasm of trigone of urinary bladder (HCC)   09/26/2020 - 10/31/2020 Chemotherapy   Patient is on Treatment Plan : BLADDER Gemcitabine  q7d     04/06/2024 Imaging   CT CAP with contrast  IMPRESSION: 1. No acute CT  findings of the chest, abdomen, or pelvis to explain fever. 2. No evidence of bladder mass. No evidence of lymphadenopathy or metastatic disease in the chest, abdomen, or pelvis. 3. Prostatomegaly. 4. Punctuate nonobstructive calculus of the superior pole of the left kidney. No right-sided calculi, ureteral calculi, or hydronephrosis. 5. Emphysema and diffuse bilateral bronchial wall thickening. 6. Coronary artery disease.   04/08/2024 Imaging   CT CAP with contrast IMPRESSION: 1. No acute CT findings of the chest, abdomen, or pelvis to explain fever. 2. No evidence of bladder mass. No evidence of lymphadenopathy or metastatic disease in the chest, abdomen, or pelvis. 3. Prostatomegaly. 4. Punctuate nonobstructive calculus of the superior pole of the left kidney. No right-sided calculi, ureteral calculi, or hydronephrosis. 5. Emphysema and diffuse bilateral bronchial wall thickening. 6. Coronary artery disease.      Discussed the use of AI scribe software for clinical note transcription with the patient, who gave verbal consent to proceed.  History of Present Illness Keith [name] is an 83 year old male with blood cancer who presents for follow-up.  He experiences persistent fatigue without change since the last visit. His weight has decreased by two pounds, now at 182 pounds. He has not yet seen the cardiologist and is scheduled for an appointment on December 16th. His last cystoscopy was in October, with the next one planned for April. He missed a scheduled procedure in October, which was delayed by  a week. He receives assistance from the [employer].     All other systems were reviewed with the patient and are negative.  MEDICAL HISTORY:  Past Medical History:  Diagnosis Date   Bladder cancer (HCC)    Blind right eye    BPH (benign prostatic hypertrophy)    Cataracts, both eyes    Depression    DM type 2 (diabetes mellitus, type 2) (HCC)    DOE (dyspnea on  exertion)    with heavy exertion   Full dentures    FUO (fever of unknown origin) 04/20/2024   GERD (gastroesophageal reflux disease)    Glaucoma no peripheral or side vision   right eye blind   Glaucoma, right eye    HOH (hard of hearing)    Hyperlipidemia    Hypertension    Hypothyroidism    Local recurrence of cancer of urinary bladder (HCC) urologsit-  dr matilda   1st dx 01-29-2016--  now recurrent 05-31-2016   Lower extremity edema 07/11/2020   right swells more than left goes down at night after sleeping   Lower urinary tract symptoms (LUTS) 2019   Lumbar stenosis 05/24/2024   Migraine    Peripheral neuropathy    middle finger left hand   Pneumonia 2019   Skin abnormality    base of spine looks like pimple has had since 07-01-2020, no drainage area intact   Wears glasses     SURGICAL HISTORY: Past Surgical History:  Procedure Laterality Date   ANTERIOR CERVICAL DECOMP/DISCECTOMY FUSION  08-15-2000   C5 -- C6   ANTERIOR CERVICAL DECOMP/DISCECTOMY FUSION  03-04-2008   C6 -- C7   areas removed from back at spine  2021 jan and aug 2021   CYSTOSCOPY W/ RETROGRADES Left 05/11/2020   Procedure: Cystoscopy with left retrograde ureteropyelogram, resection of probable bladder tumor and left trigonal/ureteral orifice region, 3 cm in size, fluoroscopic interpretation, left ureteroscopy, placement of 6 French by 26 cm contour double-J stent without tether;  Surgeon: Matilda Senior, MD;  Location: [facility];  Service: Urology;  Laterality: Left;   CYSTOSCOPY W/ RETROGRADES Bilateral 06/24/2022   Procedure: CYSTOSCOPY WITH RETROGRADE PYELOGRAM;  Surgeon: Matilda Senior, MD;  Location: [facility];  Service: Urology;  Laterality: Bilateral;  1 HR   CYSTOSCOPY WITH BIOPSY N/A 01/29/2016   Procedure: CYSTOSCOPY WITH BIOPSY;  Surgeon: Senior Matilda, MD;  Location: [facility];  Service: Urology;  Laterality: N/A;   CYSTOSCOPY WITH BIOPSY N/A  05/31/2016   Procedure: CYSTOSCOPY WITH BIOPSY;  Surgeon: Senior Matilda, MD;  Location: [facility];  Service: Urology;  Laterality: N/A;   CYSTOSCOPY WITH BIOPSY N/A 06/24/2022   Procedure: CYSTOSCOPY WITH BIOPSY;  Surgeon: Matilda Senior, MD;  Location: [facility];  Service: Urology;  Laterality: N/A;   EYE SURGERY Right 1998 & 2000   [place]   GLAUCOMA SURGERY  1998 and 2000   IR IMAGING GUIDED PORT INSERTION  03/31/2023   lower back  06/01/2019, 02-10-2020   nose cancer  areas removed  2019, 2021   x 2 / 3/ 2021 small area removed from nose   surgery for sacral fracture  40 yrs ago   TRANSURETHRAL RESECTION OF BLADDER TUMOR N/A 07/17/2020   Procedure: TRANSURETHRAL RESECTION OF BLADDER TUMOR (TURBT);  Surgeon: Matilda Senior, MD;  Location: [facility];  Service: Urology;  Laterality: N/A;    I have reviewed the social history and family history with the patient and they are  unchanged from previous note.  ALLERGIES:  has no known allergies.  MEDICATIONS:  Current Outpatient Medications  Medication Sig Dispense Refill   acetaminophen  (TYLENOL ) 650 MG CR tablet Take 1,300 mg by mouth every 8 (eight) hours as needed for pain.     amLODipine  (NORVASC ) 10 MG tablet Take 10 mg by mouth daily.     atorvastatin  (LIPITOR) 20 MG tablet Take 20 mg by mouth at bedtime.      Blood Glucose Monitoring Suppl (ACCU-CHEK AVIVA PLUS) w/Device KIT To check blood sugar vitro three times a day; Duration: 90 days     carteolol (OCUPRESS) 1 % ophthalmic solution Place 1 drop into the right eye every morning.     cyanocobalamin  (VITAMIN B12) 1000 MCG tablet Take 1,000 mcg by mouth daily.     glucose blood (ACCU-CHEK AVIVA PLUS) test strip To check blood sugars In Vitro two times a day; Duration: 90 days     latanoprost (XALATAN) 0.005 % ophthalmic solution Place 1 drop into the right eye at bedtime.     levothyroxine  (SYNTHROID , LEVOTHROID) 100 MCG tablet Take 100 mcg by  mouth daily before breakfast.      loratadine  (CLARITIN ) 10 MG tablet Take 10 mg by mouth daily as needed for allergies.     losartan  (COZAAR ) 100 MG tablet Take 100 mg by mouth at bedtime.      meloxicam (MOBIC) 15 MG tablet Take 15 mg by mouth daily as needed for pain.     omeprazole (PRILOSEC) 20 MG capsule Take 20 mg by mouth daily.     ondansetron  (ZOFRAN ) 8 MG tablet Take 1 tablet (8 mg total) by mouth every 8 (eight) hours as needed for nausea or vomiting. 30 tablet 1   OVER THE COUNTER MEDICATION Take 2 capsules by mouth daily. OmegaXL (perna canaliculus oil + PCSO-524)     oxymetazoline (AFRIN) 0.05 % nasal spray Place 1 spray into both nostrils 2 (two) times daily.     propranolol  (INDERAL ) 20 MG tablet TAKE 1 TABLET BY MOUTH TWICE A DAY 180 tablet 1   sitaGLIPtin (JANUVIA) 100 MG tablet Take 100 mg by mouth every morning.     tamsulosin  (FLOMAX ) 0.4 MG CAPS capsule Take 0.4 mg by mouth every morning.      terbinafine  (LAMISIL ) 250 MG tablet Take 1 tablet (250 mg total) by mouth daily. 30 tablet 3   No current facility-administered medications for this visit.    PHYSICAL EXAMINATION: ECOG PERFORMANCE STATUS: 2 - Symptomatic, <50% confined to bed  There were no vitals filed for this visit. Wt Readings from Last 3 Encounters:  07/05/24 182 lb 4 oz (82.7 kg)  06/25/24 184 lb 4.8 oz (83.6 kg)  06/24/24 184 lb 12.8 oz (83.8 kg)     GENERAL:alert, no distress and comfortable SKIN: skin color, texture, turgor are normal, no rashes or significant lesions EYES: normal, Conjunctiva are pink and non-injected, sclera clear Musculoskeletal:no cyanosis of digits and no clubbing  NEURO: alert & oriented x 3 with fluent speech, no focal motor/sensory deficits  Physical Exam   LABORATORY DATA:  I have reviewed the data as listed    Latest Ref Rng & Units 07/05/2024    2:54 PM 06/24/2024   10:43 AM 06/03/2024    1:28 PM  CBC  WBC 4.0 - 10.5 K/uL 5.9  5.6  5.6   Hemoglobin 13.0 -  17.0 g/dL 87.9  88.3  88.1   Hematocrit 39.0 - 52.0 % 35.4  34.2  34.3   Platelets 150 - 400 K/uL 158  142  167         Latest Ref Rng & Units 07/05/2024    2:54 PM 06/24/2024   10:43 AM 06/03/2024    1:28 PM  CMP  Glucose 70 - 99 mg/dL 829  751  801   BUN 8 - 23 mg/dL 13  15  15    Creatinine 0.61 - 1.24 mg/dL 9.08  9.03  9.08   Sodium 135 - 145 mmol/L 137  138  140   Potassium 3.5 - 5.1 mmol/L 4.1  3.8  3.9   Chloride 98 - 111 mmol/L 102  105  106   CO2 22 - 32 mmol/L 25  28  30    Calcium  8.9 - 10.3 mg/dL 9.3  9.0  9.1   Total Protein 6.5 - 8.1 g/dL 7.2  6.7  6.9   Total Bilirubin 0.0 - 1.2 mg/dL 0.3  0.4  0.4   Alkaline Phos 38 - 126 U/L 63  54  55   AST 15 - 41 U/L 21  14  13    ALT 0 - 44 U/L 21  13  11        RADIOGRAPHIC STUDIES: I have personally reviewed the radiological images as listed and agreed with the findings in the report. No results found.    No orders of the defined types were placed in this encounter.  All questions were answered. The patient knows to call the clinic with any problems, questions or concerns. No barriers to learning was detected. The total time spent in the appointment was 25 minutes, including review of chart and various tests results, discussions about plan of care and coordination of care plan     Onita Mattock, MD 07/05/2024

## 2024-07-06 LAB — T4: T4, Total: 10.4 ug/dL (ref 4.5–12.0)

## 2024-07-12 ENCOUNTER — Encounter: Payer: Self-pay | Admitting: Cardiovascular Disease

## 2024-07-12 ENCOUNTER — Ambulatory Visit: Attending: Cardiovascular Disease | Admitting: Cardiovascular Disease

## 2024-07-12 VITALS — BP 116/54 | HR 68 | Ht 70.0 in | Wt 183.0 lb

## 2024-07-12 DIAGNOSIS — I10 Essential (primary) hypertension: Secondary | ICD-10-CM | POA: Diagnosis not present

## 2024-07-12 DIAGNOSIS — I4819 Other persistent atrial fibrillation: Secondary | ICD-10-CM | POA: Diagnosis not present

## 2024-07-12 DIAGNOSIS — I4891 Unspecified atrial fibrillation: Secondary | ICD-10-CM

## 2024-07-12 DIAGNOSIS — Z720 Tobacco use: Secondary | ICD-10-CM | POA: Diagnosis not present

## 2024-07-12 DIAGNOSIS — E785 Hyperlipidemia, unspecified: Secondary | ICD-10-CM | POA: Insufficient documentation

## 2024-07-12 DIAGNOSIS — I519 Heart disease, unspecified: Secondary | ICD-10-CM | POA: Insufficient documentation

## 2024-07-12 DIAGNOSIS — Z01818 Encounter for other preprocedural examination: Secondary | ICD-10-CM | POA: Diagnosis not present

## 2024-07-12 DIAGNOSIS — E782 Mixed hyperlipidemia: Secondary | ICD-10-CM | POA: Diagnosis not present

## 2024-07-12 DIAGNOSIS — I739 Peripheral vascular disease, unspecified: Secondary | ICD-10-CM | POA: Diagnosis not present

## 2024-07-12 NOTE — Assessment & Plan Note (Signed)
 Over 50 pack years of tobacco abuse currently smoking 4 cigarettes a day.

## 2024-07-12 NOTE — Assessment & Plan Note (Signed)
 Patient complains of bilateral claudication for the last 6 to 8 months which is lifestyle limiting.  I am going to get lower extremity arterial Doppler studies.

## 2024-07-12 NOTE — Assessment & Plan Note (Signed)
 History of hyperlipidemia on statin therapy with lipid profile performed 07/04/2023 revealing total cholesterol 108, LDL 55 and HDL 27.

## 2024-07-12 NOTE — Assessment & Plan Note (Signed)
 A-fib found on preop EKG by anesthesia for herniated disc surgery by Dr. Gillie.  EKG from a year ago showed sinus rhythm.  He is unaware that he is in A-fib.  His CV risk is 5.  He will need to be on oral anticoagulant.  I am referring him to the A-fib clinic to initiate this and to consider outpatient DC cardioversion.  I suspect his mild LV dysfunction by echo 06/14/2024 with an EF of 45 to 50% is related to this.

## 2024-07-12 NOTE — Assessment & Plan Note (Signed)
 History of essential hypertension blood pressure measured today at 116/54.  He is on amlodipine , losartan  and propranolol .

## 2024-07-12 NOTE — Assessment & Plan Note (Signed)
 Patient has mild LV dysfunction and multiple cardiac risk factors with new onset A-fib.  He has mild to moderately increased perioperative risk.  I prefer for him to be in sinus rhythm prior to his surgery.

## 2024-07-12 NOTE — Assessment & Plan Note (Signed)
 Recent echo performed 06/14/2024 revealed EF of 45 to 50% with mildly dilated LV.  He had no valvular abnormalities.  I suspect his LV dysfunction is related to his A-fib.  We will recheck a 2D echo once sinus rhythm is restored.

## 2024-07-12 NOTE — Patient Instructions (Addendum)
 Medication Instructions:  Your physician recommends that you continue on your current medications as directed. Please refer to the Current Medication list given to you today.  *If you need a refill on your cardiac medications before your next appointment, please call your pharmacy*  Testing/Procedures: Your physician has requested that you have a lower extremity arterial duplex. During this test, ultrasound is used to evaluate arterial blood flow in the legs. Allow one hour for this exam. There are no restrictions or special instructions. This will take place at [address], 4th floor  Please note: We ask at that you not bring children with you during ultrasound (echo/ vascular) testing. Due to room size and safety concerns, children are not allowed in the ultrasound rooms during exams. Our front office staff cannot provide observation of children in our lobby area while testing is being conducted. An adult accompanying a patient to their appointment will only be allowed in the ultrasound room at the discretion of the ultrasound technician under special circumstances. We apologize for any inconvenience.   Your physician has requested that you have an ankle brachial index (ABI). During this test an ultrasound and blood pressure cuff are used to evaluate the arteries that supply the arms and legs with blood. Allow thirty minutes for this exam. There are no restrictions or special instructions. This will take place at [address], 4th floor   Please note: We ask at that you not bring children with you during ultrasound (echo/ vascular) testing. Due to room size and safety concerns, children are not allowed in the ultrasound rooms during exams. Our front office staff cannot provide observation of children in our lobby area while testing is being conducted. An adult accompanying a patient to their appointment will only be allowed in the ultrasound room at the discretion of the ultrasound  technician under special circumstances. We apologize for any inconvenience.    Follow-Up: At [facility], you and your health needs are our priority.  As part of our continuing mission to provide you with exceptional heart care, our providers are all part of one team.  This team includes your primary Cardiologist (physician) and Advanced Practice Providers or APPs (Physician Assistants and Nurse Practitioners) who all work together to provide you with the care you need, when you need it.  Your next appointment:   3 month(s)  Provider:   Dorn Lesches, MD

## 2024-07-12 NOTE — Progress Notes (Signed)
 07/12/2024 Keith [name]   1941-01-12  [id number]  Primary Physician Valentin Skates, DO Primary Cardiologist: Dorn JINNY Lesches MD FACP, [place], [place], [place]  HPI:  Keith [name] is a 83 y.o. mildly overweight married Caucasian male father of 3 children, grandfather of 7 grandchildren who is accompanied by his wife Keith [name] and one of his daughters Keith [name] today.  He was referred by Dr. Valentin , his PCP, because of newly recognized A-fib.  He is retired from engineer, petroleum work.  His risk factors include ongoing tobacco abuse to 4 cigarettes a day with over 50 pack years of tobacco abuse, treated hypertension, diabetes and hyperlipidemia.  His father had an MI in his 71s.  He has never had a heart attack or stroke.  He denies chest pain or shortness of breath.  He has had bladder cancer and is on Keytruda .  He has noticed claudication for the last 6 to 8 months which is lifestyle-limiting.  He was found to be in A-fib with preop EKG done prior to planned elective back surgery by Dr. Lindalee last month.  EKG a year ago showed sinus rhythm.  He is unaware that he is in A-fib.  His CV risk is 5.   Current Meds  Medication Sig   acetaminophen  (TYLENOL ) 650 MG CR tablet Take 1,300 mg by mouth every 8 (eight) hours as needed for pain.   amLODipine  (NORVASC ) 10 MG tablet Take 10 mg by mouth daily.   atorvastatin  (LIPITOR) 20 MG tablet Take 20 mg by mouth at bedtime.    Blood Glucose Monitoring Suppl (ACCU-CHEK AVIVA PLUS) w/Device KIT To check blood sugar vitro three times a day; Duration: 90 days   carteolol (OCUPRESS) 1 % ophthalmic solution Place 1 drop into the right eye every morning.   cyanocobalamin  (VITAMIN B12) 1000 MCG tablet Take 1,000 mcg by mouth daily.   glucose blood (ACCU-CHEK AVIVA PLUS) test strip To check blood sugars In Vitro two times a day; Duration: 90 days   latanoprost (XALATAN) 0.005 % ophthalmic solution Place 1 drop into the right eye at bedtime.   levothyroxine   (SYNTHROID , LEVOTHROID) 100 MCG tablet Take 100 mcg by mouth daily before breakfast.    loratadine  (CLARITIN ) 10 MG tablet Take 10 mg by mouth daily as needed for allergies.   losartan  (COZAAR ) 100 MG tablet Take 100 mg by mouth at bedtime.    meloxicam (MOBIC) 15 MG tablet Take 15 mg by mouth daily as needed for pain.   omeprazole (PRILOSEC) 20 MG capsule Take 20 mg by mouth daily.   ondansetron  (ZOFRAN ) 8 MG tablet Take 1 tablet (8 mg total) by mouth every 8 (eight) hours as needed for nausea or vomiting.   OVER THE COUNTER MEDICATION Take 2 capsules by mouth daily. OmegaXL (perna canaliculus oil + PCSO-524)   oxymetazoline (AFRIN) 0.05 % nasal spray Place 1 spray into both nostrils 2 (two) times daily.   propranolol  (INDERAL ) 20 MG tablet TAKE 1 TABLET BY MOUTH TWICE A DAY   sitaGLIPtin (JANUVIA) 100 MG tablet Take 100 mg by mouth every morning.   tamsulosin  (FLOMAX ) 0.4 MG CAPS capsule Take 0.4 mg by mouth every morning.    terbinafine  (LAMISIL ) 250 MG tablet Take 1 tablet (250 mg total) by mouth daily.     No Known Allergies  Social History   Socioeconomic History   Marital status: Married    Spouse name: Not on file   Number of children: 1  Years of education: Not on file   Highest education level: Not on file  Occupational History   Not on file  Tobacco Use   Smoking status: Former    Current packs/day: 0.00    Average packs/day: 1 pack/day for 50.0 years (50.0 ttl pk-yrs)    Types: Cigarettes    Start date: 06/27/1954    Quit date: 06/27/2004    Years since quitting: 20.0   Smokeless tobacco: Never  Vaping Use   Vaping status: Never Used  Substance and Sexual Activity   Alcohol use: Yes    Alcohol/week: 0.0 standard drinks of alcohol    Comment: occ moonshine garles throat with   Drug use: No   Sexual activity: Not on file  Other Topics Concern   Not on file  Social History Narrative   Not on file   Social Drivers of Health   Financial Resource Strain: Not  on file  Food Insecurity: No Food Insecurity (04/20/2024)   Hunger Vital Sign    Worried About Running Out of Food in the Last Year: Never true    Ran Out of Food in the Last Year: Never true  Transportation Needs: No Transportation Needs (04/20/2024)   PRAPARE - Administrator, Civil Service (Medical): No    Lack of Transportation (Non-Medical): No  Physical Activity: Not on file  Stress: Not on file  Social Connections: Not on file  Intimate Partner Violence: Not At Risk (04/20/2024)   Humiliation, Afraid, Rape, and Kick questionnaire    Fear of Current or Ex-Partner: No    Emotionally Abused: No    Physically Abused: No    Sexually Abused: No     Review of Systems: General: negative for chills, fever, night sweats or weight changes.  Cardiovascular: negative for chest pain, dyspnea on exertion, edema, orthopnea, palpitations, paroxysmal nocturnal dyspnea or shortness of breath Dermatological: negative for rash Respiratory: negative for cough or wheezing Urologic: negative for hematuria Abdominal: negative for nausea, vomiting, diarrhea, bright red blood per rectum, melena, or hematemesis Neurologic: negative for visual changes, syncope, or dizziness All other systems reviewed and are otherwise negative except as noted above.    Blood pressure (!) 116/54, pulse 68, height 5' 10 (1.778 m), weight 183 lb (83 kg), SpO2 98%.  General appearance: alert and no distress Neck: no adenopathy, no carotid bruit, no JVD, supple, symmetrical, trachea midline, and thyroid  not enlarged, symmetric, no tenderness/mass/nodules Lungs: clear to auscultation bilaterally Heart: irregularly irregular rhythm Extremities: extremities normal, atraumatic, no cyanosis or edema Pulses: 2+ and symmetric Skin: Skin color, texture, turgor normal. No rashes or lesions Neurologic: Grossly normal  EKG EKG Interpretation Date/Time:  Monday July 12 2024 14:21:21 EST Ventricular Rate:  68 PR  Interval:    QRS Duration:  160 QT Interval:  446 QTC Calculation: 474 R Axis:   -28  Text Interpretation: Atrial fibrillation Right bundle branch block When compared with ECG of 25-Jun-2024 09:57, No significant change was found Confirmed by Court Carrier [phone] on 07/12/2024 2:42:45 PM    ASSESSMENT AND PLAN:   Essential hypertension History of essential hypertension blood pressure measured today at 116/54.  He is on amlodipine , losartan  and propranolol .  Hyperlipidemia History of hyperlipidemia on statin therapy with lipid profile performed 07/04/2023 revealing total cholesterol 108, LDL 55 and HDL 27.  Tobacco abuse Over 50 pack years of tobacco abuse currently smoking 4 cigarettes a day.  Persistent atrial fibrillation (HCC) A-fib found on preop EKG by anesthesia for  herniated disc surgery by Dr. Gillie.  EKG from a year ago showed sinus rhythm.  He is unaware that he is in A-fib.  His CV risk is 5.  He will need to be on oral anticoagulant.  I am referring him to the A-fib clinic to initiate this and to consider outpatient DC cardioversion.  I suspect his mild LV dysfunction by echo 06/14/2024 with an EF of 45 to 50% is related to this.  Left ventricular dysfunction Recent echo performed 06/14/2024 revealed EF of 45 to 50% with mildly dilated LV.  He had no valvular abnormalities.  I suspect his LV dysfunction is related to his A-fib.  We will recheck a 2D echo once sinus rhythm is restored.  Preoperative clearance Patient has mild LV dysfunction and multiple cardiac risk factors with new onset A-fib.  He has mild to moderately increased perioperative risk.  I prefer for him to be in sinus rhythm prior to his surgery.  Claudication in peripheral vascular disease Patient complains of bilateral claudication for the last 6 to 8 months which is lifestyle limiting.  I am going to get lower extremity arterial Doppler studies.     Dorn DOROTHA Lesches MD FACP,FACC,FAHA,  [facility] 07/12/2024 3:07 PM

## 2024-07-13 ENCOUNTER — Ambulatory Visit (HOSPITAL_COMMUNITY): Admitting: Internal Medicine

## 2024-07-13 ENCOUNTER — Other Ambulatory Visit: Payer: Self-pay

## 2024-07-16 ENCOUNTER — Inpatient Hospital Stay

## 2024-07-16 ENCOUNTER — Encounter: Payer: Self-pay | Admitting: Hematology

## 2024-07-16 ENCOUNTER — Inpatient Hospital Stay: Admitting: Nurse Practitioner

## 2024-07-16 ENCOUNTER — Ambulatory Visit (HOSPITAL_COMMUNITY)

## 2024-07-16 ENCOUNTER — Ambulatory Visit (HOSPITAL_COMMUNITY)
Admission: RE | Admit: 2024-07-16 | Discharge: 2024-07-16 | Attending: Physician Assistant | Admitting: Physician Assistant

## 2024-07-16 ENCOUNTER — Encounter (HOSPITAL_COMMUNITY): Payer: Self-pay | Admitting: Physician Assistant

## 2024-07-16 VITALS — BP 116/60 | HR 78 | Ht 70.0 in | Wt 181.0 lb

## 2024-07-16 DIAGNOSIS — I700 Atherosclerosis of aorta: Secondary | ICD-10-CM | POA: Diagnosis not present

## 2024-07-16 DIAGNOSIS — I4819 Other persistent atrial fibrillation: Secondary | ICD-10-CM | POA: Diagnosis not present

## 2024-07-16 DIAGNOSIS — D6859 Other primary thrombophilia: Secondary | ICD-10-CM

## 2024-07-16 DIAGNOSIS — I4891 Unspecified atrial fibrillation: Secondary | ICD-10-CM | POA: Diagnosis not present

## 2024-07-16 DIAGNOSIS — I5020 Unspecified systolic (congestive) heart failure: Secondary | ICD-10-CM | POA: Diagnosis not present

## 2024-07-16 MED ORDER — APIXABAN 5 MG PO TABS: 5.0000 mg | ORAL_TABLET | Freq: Two times a day (BID) | ORAL | 3 refills | Status: AC

## 2024-07-16 NOTE — Patient Instructions (Signed)
 Start Eliquis 5mg  twice a day

## 2024-07-16 NOTE — Progress Notes (Signed)
 Primary Care Physician: Valentin Skates, DO Primary Cardiologist: Dorn Lesches, MD Electrophysiologist: None  Referring Physician: Dr. Lesches Keith [name] is a 83 y.o. male with a history of PAF, HTN, Aortic Arthrosclerosis, HLD, hypothyroidism, DM type II, GERD, bladder CA s/p TURBT 07/2020 who presents for follow up in the [facility] Atrial Fibrillation [facility].  The patient was initially diagnosed with atrial fibrillation during recent preop EKG for elective back surgery.  He was seen by Dr.Berry on 07/12/2024 and reported no symptoms associated with AF.  He was referred today to discuss initiation of anticoagulation and possible DCCV for treatment.  He completed a 2D echo on 06/14/2024 showing EF of 45-50% with possible decrease in LV function associated with A-fib.  He has a history of hypothyroidism and is currently on Synthroid  with stable TSH by most recent labs.  Keith [name] presents today with his wife to establish care in the A-fib clinic.  On examination today he is in A-fib with controlled rate of 78 bpm.  He continues to be asymptomatic and denies any complaints of dizziness or fatigue since diagnosis.  During today's visit we discussed the pathophysiology of atrial fibrillation and also briefly reviewed treatment such as medication, ablation, and DCCV.  We also discussed the importance of abstaining from excess alcohol use. Per his wife he does snore and he is in favor of completing a sleep study in the future to rule out OSA.   Today, he denies symptoms of palpitations, chest pain, shortness of breath, orthopnea, PND, lower extremity edema, dizziness, presyncope, syncope, daytime somnolence, bleeding, or neurologic sequela. The patient is tolerating medications without difficulties and is otherwise without complaint today.    Atrial Fibrillation Risk Factors:  We will arrange for sleep study at next visit he does not have a history of rheumatic fever. he does have a  history of alcohol use and reports 1-2 drinks monthly only The patient does not have a history of early familial atrial fibrillation or other arrhythmias.  Atrial Fibrillation Management history:  Previous antiarrhythmic drugs: None Previous cardioversions: None Previous ablations: None Anticoagulation history: None  ROS- All systems are reviewed and negative except as per the HPI above.  Past Medical History:  Diagnosis Date   Bladder cancer (HCC)    Blind right eye    BPH (benign prostatic hypertrophy)    Cataracts, both eyes    Depression    DM type 2 (diabetes mellitus, type 2) (HCC)    DOE (dyspnea on exertion)    with heavy exertion   Full dentures    FUO (fever of unknown origin) 04/20/2024   GERD (gastroesophageal reflux disease)    Glaucoma no peripheral or side vision   right eye blind   Glaucoma, right eye    HOH (hard of hearing)    Hyperlipidemia    Hypertension    Hypothyroidism    Local recurrence of cancer of urinary bladder [facility]) urologsit-  dr matilda   1st dx 01-29-2016--  now recurrent 05-31-2016   Lower extremity edema 07/11/2020   right swells more than left goes down at night after sleeping   Lower urinary tract symptoms (LUTS) 2019   Lumbar stenosis 05/24/2024   Migraine    Peripheral neuropathy    middle finger left hand   Pneumonia 2019   Skin abnormality    base of spine looks like pimple has had since 07-01-2020, no drainage area intact   Wears glasses     Current  Outpatient Medications  Medication Sig Dispense Refill   acetaminophen  (TYLENOL ) 650 MG CR tablet Take 1,300 mg by mouth every 8 (eight) hours as needed for pain.     amLODipine  (NORVASC ) 10 MG tablet Take 10 mg by mouth daily.     apixaban  (ELIQUIS ) 5 MG TABS tablet Take 1 tablet (5 mg total) by mouth 2 (two) times daily. 60 tablet 3   atorvastatin  (LIPITOR) 20 MG tablet Take 20 mg by mouth at bedtime.      Blood Glucose Monitoring Suppl (ACCU-CHEK AVIVA PLUS) w/Device KIT  To check blood sugar vitro three times a day; Duration: 90 days     carteolol (OCUPRESS) 1 % ophthalmic solution Place 1 drop into the right eye every morning.     cyanocobalamin  (VITAMIN B12) 1000 MCG tablet Take 1,000 mcg by mouth daily.     glimepiride  (AMARYL ) 1 MG tablet He will only take if sugar is above 150 - 1 mg total     glucose blood (ACCU-CHEK AVIVA PLUS) test strip To check blood sugars In Vitro two times a day; Duration: 90 days     latanoprost (XALATAN) 0.005 % ophthalmic solution Place 1 drop into the right eye at bedtime.     levothyroxine  (SYNTHROID , LEVOTHROID) 100 MCG tablet Take 100 mcg by mouth daily before breakfast.      loratadine  (CLARITIN ) 10 MG tablet Take 10 mg by mouth daily as needed for allergies. (Patient taking differently: Take 10 mg by mouth as needed for allergies.)     losartan  (COZAAR ) 100 MG tablet Take 100 mg by mouth at bedtime.      meloxicam (MOBIC) 15 MG tablet Take 15 mg by mouth daily as needed for pain. (Patient taking differently: Take 15 mg by mouth every other day.)     omeprazole (PRILOSEC) 20 MG capsule Take 20 mg by mouth daily.     ondansetron  (ZOFRAN ) 8 MG tablet Take 1 tablet (8 mg total) by mouth every 8 (eight) hours as needed for nausea or vomiting. (Patient taking differently: Take 8 mg by mouth as needed for nausea or vomiting.) 30 tablet 1   OVER THE COUNTER MEDICATION Take 2 capsules by mouth daily. OmegaXL (perna canaliculus oil + PCSO-524) (Patient taking differently: Take 4 capsules by mouth daily. OmegaXL (perna canaliculus oil + PCSO-524))     oxymetazoline (AFRIN) 0.05 % nasal spray Place 1 spray into both nostrils 2 (two) times daily. (Patient taking differently: Place 1 spray into both nostrils as needed.)     propranolol  (INDERAL ) 20 MG tablet TAKE 1 TABLET BY MOUTH TWICE A DAY 180 tablet 1   sitaGLIPtin (JANUVIA) 100 MG tablet Take 100 mg by mouth every morning.     tamsulosin  (FLOMAX ) 0.4 MG CAPS capsule Take 0.4 mg by mouth  every morning.      terbinafine  (LAMISIL ) 250 MG tablet Take 1 tablet (250 mg total) by mouth daily. 30 tablet 3   pembrolizumab  (KEYTRUDA ) 100 MG/4ML SOLN Inject into the vein every 21 ( twenty-one) days.     No current facility-administered medications for this encounter.    Physical Exam: BP 116/60   Pulse 78   Ht 5' 10 (1.778 m)   Wt 82.1 kg   BMI 25.97 kg/m   GEN: Well nourished, well developed in no acute distress NECK: No JVD; No carotid bruits CARDIAC: Irregularly irregular rate and rhythm, no murmurs, rubs, gallops RESPIRATORY:  Clear to auscultation without rales, wheezing or rhonchi  ABDOMEN: Soft, non-tender, non-distended  EXTREMITIES:  No edema; No deformity   Wt Readings from Last 3 Encounters:  07/16/24 82.1 kg  07/12/24 83 kg  07/05/24 82.7 kg     EKG today demonstrates:   EKG Interpretation Date/Time:  Friday July 16 2024 09:25:48 EST Ventricular Rate:  78 PR Interval:  186 QRS Duration:  162 QT Interval:  394 QTC Calculation: 449 R Axis:   -89  Text Interpretation: Atrial fibrillation Left axis deviation Right bundle branch block Abnormal ECG Confirmed by Wyn Manus [phone]) on 07/16/2024 10:22:44 AM        Echo 06/15/2024 demonstrated  1. Left ventricular ejection fraction, by estimation, is 45 to 50%. The  left ventricle has mildly decreased function. The left ventricle  demonstrates global hypokinesis. The left ventricular internal cavity size  was mildly dilated. Left ventricular  diastolic parameters are indeterminate.   2. Right ventricular systolic function is normal. The right ventricular  size is normal. There is normal pulmonary artery systolic pressure. The  estimated right ventricular systolic pressure is 33.4 mmHg.   3. The mitral valve is normal in structure. Trivial mitral valve  regurgitation. No evidence of mitral stenosis.   4. The aortic valve is normal in structure. Aortic valve regurgitation is  not visualized. No  aortic stenosis is present.   5. The inferior vena cava is normal in size with <50% respiratory  variability, suggesting right atrial pressure of 8 mmHg.   6. Possible atrial fibrillation.    CHA2DS2-VASc Score = 6  The patient's score is based upon: CHF History: 1 HTN History: 1 Diabetes History: 1 Stroke History: 0 Vascular Disease History: 1 Age Score: 2 Gender Score: 0       ASSESSMENT AND PLAN: Persistent Atrial Fibrillation (ICD10:  I48.19) The patient's CHA2DS2-VASc score is 6, indicating a 9.7% annual risk of stroke.   -  start Eliquis  5 mg twice daily -  plan for DCCV in 3 weeks if still in atrial fibrillation - Check CBC in 1 month  Secondary Hypercoagulable State (ICD10:  D68.69) The patient is at significant risk for stroke/thromboembolism based upon his CHA2DS2-VASc Score of 6.  Start Apixaban  (Eliquis ).   Primary HTN: - Blood pressure well-controlled on current regimen  HFmrEF: - Recent 2D echo completed while in atrial fibrillation showing EF of 45-50% with plan to repeat once sinus rhythm is obtained.  Aortic Arthrosclerosis: - Continue Lipitor 20 mg    Follow-up in 3 weeks in atrial fibrillation clinic   Informed Consent   Shared Decision Making/Informed Consent The risks (stroke, cardiac arrhythmias rarely resulting in the need for a temporary or permanent pacemaker, skin irritation or burns and complications associated with conscious sedation including aspiration, arrhythmia, respiratory failure and death), benefits (restoration of normal sinus rhythm) and alternatives of a direct current cardioversion were explained in detail to Keith [name] and he agrees to proceed.      Manus Wyn, NP-C [facility] [address] [place], [place] [address] [phone]

## 2024-07-19 ENCOUNTER — Ambulatory Visit (HOSPITAL_COMMUNITY)

## 2024-07-23 ENCOUNTER — Ambulatory Visit (HOSPITAL_COMMUNITY)
Admission: RE | Admit: 2024-07-23 | Discharge: 2024-07-23 | Attending: Cardiovascular Disease | Admitting: Cardiovascular Disease

## 2024-07-23 DIAGNOSIS — I739 Peripheral vascular disease, unspecified: Secondary | ICD-10-CM

## 2024-07-24 ENCOUNTER — Ambulatory Visit: Payer: Self-pay | Admitting: Cardiovascular Disease

## 2024-07-24 LAB — VAS US ABI WITH/WO TBI
Left ABI: 0.9
Right ABI: 0.72

## 2024-07-26 NOTE — Progress Notes (Unsigned)
 [facility]   [phone] [phone]    Patient Care Team: Valentin Skates, DO as PCP - General (Internal Medicine) Court Dorn PARAS, MD as PCP - Cardiology (Cardiology) Lanny Callander, MD as Attending Physician (Hematology and Oncology) Matilda Senior, MD as Consulting Physician (Urology)   CHIEF COMPLAINT: Follow up bladder cancer   Oncology History Overview Note   Cancer Staging  Bladder cancer [facility]) Staging form: Urinary Bladder, AJCC 8th Edition - Clinical: Stage I (cT1, cN0, cM0) - Signed by Amadeo Windell SAILOR, MD on 09/12/2020 WHO/ISUP grade (low/high): High Grade Histologic grading system: 2 grade system - Pathologic stage from 06/24/2022: Stage 0is (pTis, pN0, cM0) - Signed by Lanny Callander, MD on 08/24/2022 Stage prefix: Initial diagnosis WHO/ISUP grade (low/high): High Grade Histologic grading system: 2 grade system     Bladder cancer (HCC)  09/12/2020 Initial Diagnosis   Bladder cancer (HCC)   09/12/2020 Cancer Staging   Staging form: Urinary Bladder, AJCC 8th Edition - Clinical: Stage I (cT1, cN0, cM0) - Signed by Amadeo Windell SAILOR, MD on 09/12/2020 WHO/ISUP grade (low/high): High Grade Histologic grading system: 2 grade system   06/24/2022 Pathology Results             FINAL MICROSCOPIC DIAGNOSIS:  A. BLADDER, BIOPSY: Focal urothelial carcinoma in situ (CIS)     06/24/2022 Cancer Staging   Staging form: Urinary Bladder, AJCC 8th Edition - Pathologic stage from 06/24/2022: Stage 0is (pTis, pN0, cM0) - Signed by Lanny Callander, MD on 08/24/2022 Stage prefix: Initial diagnosis WHO/ISUP grade (low/high): High Grade Histologic grading system: 2 grade system   08/28/2022 - 08/28/2022 Chemotherapy   Patient is on Treatment Plan : BLADDER Gemcitabine  INTRAVESICAL (2000) q7d     09/12/2022 - 02/26/2023 Chemotherapy   Patient is on Treatment Plan : BLADDER Mitomycin  INTRAVESICAL (40 mg) q7d     10/25/2022 Genetic Testing   Negative  [facility] CancerNext-Expanded +RNA Panel.  Report date is 10/25/2022.   The CancerNext-Expanded gene panel offered by [facility] and includes sequencing, rearrangement, and RNA analysis for the following 77 genes: AIP, ALK, APC, ATM, AXIN2, BAP1, BARD1, BLM, BMPR1A, BRCA1, BRCA2, BRIP1, CDC73, CDH1, CDK4, CDKN1B, CDKN2A, CHEK2, CTNNA1, DICER1, FANCC, FH, FLCN, GALNT12, KIF1B, LZTR1, MAX, MEN1, MET, MLH1, MSH2, MSH3, MSH6, MUTYH, NBN, NF1, NF2, NTHL1, PALB2, PHOX2B, PMS2, POT1, PRKAR1A, PTCH1, PTEN, RAD51C, RAD51D, RB1, RECQL, RET, SDHA, SDHAF2, SDHB, SDHC, SDHD, SMAD4, SMARCA4, SMARCB1, SMARCE1, STK11, SUFU, TMEM127, TP53, TSC1, TSC2, VHL and XRCC2 (sequencing and deletion/duplication); EGFR, EGLN1, HOXB13, KIT, MITF, PDGFRA, POLD1, and POLE (sequencing only); EPCAM and GREM1 (deletion/duplication only).    03/18/2023 -  Chemotherapy   Patient is on Treatment Plan : BLADDER Pembrolizumab  (200) q21d     Malignant neoplasm of trigone of urinary bladder (HCC)  09/12/2020 Initial Diagnosis   Malignant neoplasm of trigone of urinary bladder (HCC)   09/26/2020 - 10/31/2020 Chemotherapy   Patient is on Treatment Plan : BLADDER Gemcitabine  q7d     04/06/2024 Imaging   CT CAP with contrast  IMPRESSION: 1. No acute CT findings of the chest, abdomen, or pelvis to explain fever. 2. No evidence of bladder mass. No evidence of lymphadenopathy or metastatic disease in the chest, abdomen, or pelvis. 3. Prostatomegaly. 4. Punctuate nonobstructive calculus of the superior pole of the left kidney. No right-sided calculi, ureteral calculi, or hydronephrosis. 5. Emphysema and diffuse bilateral bronchial wall thickening. 6. Coronary artery disease.   04/08/2024 Imaging   CT CAP with  contrast IMPRESSION: 1. No acute CT findings of the chest, abdomen, or pelvis to explain fever. 2. No evidence of bladder mass. No evidence of lymphadenopathy or metastatic disease in the chest, abdomen, or pelvis. 3. Prostatomegaly. 4.  Punctuate nonobstructive calculus of the superior pole of the left kidney. No right-sided calculi, ureteral calculi, or hydronephrosis. 5. Emphysema and diffuse bilateral bronchial wall thickening. 6. Coronary artery disease.      CURRENT THERAPY: Pembrolizumab  q3 weeks (for up to 2 years if no recurrence)  INTERVAL HISTORY Keith [name] returns for follow up as scheduled. He continues pembro q3 weeks. Having slightly increased urinary frequency without urgency, dysuria, hematuria. Denies fever/chills. Has a little cough, keeps the heat on at home. Denies diarrhea, rash, or other new/specific complaints.   ROS  All other systems reviewed and negative  Past Medical History:  Diagnosis Date   Bladder cancer (HCC)    Blind right eye    BPH (benign prostatic hypertrophy)    Cataracts, both eyes    Depression    DM type 2 (diabetes mellitus, type 2) (HCC)    DOE (dyspnea on exertion)    with heavy exertion   Full dentures    FUO (fever of unknown origin) 04/20/2024   GERD (gastroesophageal reflux disease)    Glaucoma no peripheral or side vision   right eye blind   Glaucoma, right eye    HOH (hard of hearing)    Hyperlipidemia    Hypertension    Hypothyroidism    Local recurrence of cancer of urinary bladder (HCC) urologsit-  dr matilda   1st dx 01-29-2016--  now recurrent 05-31-2016   Lower extremity edema 07/11/2020   right swells more than left goes down at night after sleeping   Lower urinary tract symptoms (LUTS) 2019   Lumbar stenosis 05/24/2024   Migraine    Peripheral neuropathy    middle finger left hand   Pneumonia 2019   Skin abnormality    base of spine looks like pimple has had since 07-01-2020, no drainage area intact   Wears glasses      Past Surgical History:  Procedure Laterality Date   ANTERIOR CERVICAL DECOMP/DISCECTOMY FUSION  08-15-2000   C5 -- C6   ANTERIOR CERVICAL DECOMP/DISCECTOMY FUSION  03-04-2008   C6 -- C7   areas removed from back at  spine  2021 jan and aug 2021   CYSTOSCOPY W/ RETROGRADES Left 05/11/2020   Procedure: Cystoscopy with left retrograde ureteropyelogram, resection of probable bladder tumor and left trigonal/ureteral orifice region, 3 cm in size, fluoroscopic interpretation, left ureteroscopy, placement of 6 French by 26 cm contour double-J stent without tether;  Surgeon: Matilda Senior, MD;  Location: [facility];  Service: Urology;  Laterality: Left;   CYSTOSCOPY W/ RETROGRADES Bilateral 06/24/2022   Procedure: CYSTOSCOPY WITH RETROGRADE PYELOGRAM;  Surgeon: Matilda Senior, MD;  Location: [facility];  Service: Urology;  Laterality: Bilateral;  1 HR   CYSTOSCOPY WITH BIOPSY N/A 01/29/2016   Procedure: CYSTOSCOPY WITH BIOPSY;  Surgeon: Senior Matilda, MD;  Location: [facility];  Service: Urology;  Laterality: N/A;   CYSTOSCOPY WITH BIOPSY N/A 05/31/2016   Procedure: CYSTOSCOPY WITH BIOPSY;  Surgeon: Senior Matilda, MD;  Location: [facility];  Service: Urology;  Laterality: N/A;   CYSTOSCOPY WITH BIOPSY N/A 06/24/2022   Procedure: CYSTOSCOPY WITH BIOPSY;  Surgeon: Matilda Senior, MD;  Location: [facility];  Service: Urology;  Laterality: N/A;   EYE SURGERY Right 1998 & 2000  [place]   GLAUCOMA SURGERY  1998 and 2000   IR IMAGING GUIDED PORT INSERTION  03/31/2023   lower back  06/01/2019, 02-10-2020   nose cancer  areas removed  2019, 2021   x 2 / 3/ 2021 small area removed from nose   surgery for sacral fracture  40 yrs ago   TRANSURETHRAL RESECTION OF BLADDER TUMOR N/A 07/17/2020   Procedure: TRANSURETHRAL RESECTION OF BLADDER TUMOR (TURBT);  Surgeon: Matilda Senior, MD;  Location: [facility];  Service: Urology;  Laterality: N/A;     Outpatient Encounter Medications as of 07/27/2024  Medication Sig   lidocaine -prilocaine  (EMLA ) cream Apply 1 Application topically as needed.   acetaminophen  (TYLENOL ) 650 MG CR tablet Take 1,300 mg by  mouth every 8 (eight) hours as needed for pain.   amLODipine  (NORVASC ) 10 MG tablet Take 10 mg by mouth daily.   apixaban  (ELIQUIS ) 5 MG TABS tablet Take 1 tablet (5 mg total) by mouth 2 (two) times daily.   atorvastatin  (LIPITOR) 20 MG tablet Take 20 mg by mouth at bedtime.    Blood Glucose Monitoring Suppl (ACCU-CHEK AVIVA PLUS) w/Device KIT To check blood sugar vitro three times a day; Duration: 90 days   carteolol (OCUPRESS) 1 % ophthalmic solution Place 1 drop into the right eye every morning.   cyanocobalamin  (VITAMIN B12) 1000 MCG tablet Take 1,000 mcg by mouth daily.   glimepiride  (AMARYL ) 1 MG tablet He will only take if sugar is above 150 - 1 mg total   glucose blood (ACCU-CHEK AVIVA PLUS) test strip To check blood sugars In Vitro two times a day; Duration: 90 days   latanoprost (XALATAN) 0.005 % ophthalmic solution Place 1 drop into the right eye at bedtime.   levothyroxine  (SYNTHROID , LEVOTHROID) 100 MCG tablet Take 100 mcg by mouth daily before breakfast.    loratadine  (CLARITIN ) 10 MG tablet Take 10 mg by mouth daily as needed for allergies. (Patient taking differently: Take 10 mg by mouth as needed for allergies.)   losartan  (COZAAR ) 100 MG tablet Take 100 mg by mouth at bedtime.    meloxicam (MOBIC) 15 MG tablet Take 15 mg by mouth daily as needed for pain. (Patient taking differently: Take 15 mg by mouth every other day.)   omeprazole (PRILOSEC) 20 MG capsule Take 20 mg by mouth daily.   ondansetron  (ZOFRAN ) 8 MG tablet Take 1 tablet (8 mg total) by mouth every 8 (eight) hours as needed for nausea or vomiting. (Patient taking differently: Take 8 mg by mouth as needed for nausea or vomiting.)   OVER THE COUNTER MEDICATION Take 2 capsules by mouth daily. OmegaXL (perna canaliculus oil + PCSO-524) (Patient taking differently: Take 4 capsules by mouth daily. OmegaXL (perna canaliculus oil + PCSO-524))   oxymetazoline (AFRIN) 0.05 % nasal spray Place 1 spray into both nostrils 2 (two)  times daily. (Patient taking differently: Place 1 spray into both nostrils as needed.)   pembrolizumab  (KEYTRUDA ) 100 MG/4ML SOLN Inject into the vein every 21 ( twenty-one) days.   propranolol  (INDERAL ) 20 MG tablet TAKE 1 TABLET BY MOUTH TWICE A DAY   sitaGLIPtin (JANUVIA) 100 MG tablet Take 100 mg by mouth every morning.   tamsulosin  (FLOMAX ) 0.4 MG CAPS capsule Take 0.4 mg by mouth every morning.    terbinafine  (LAMISIL ) 250 MG tablet Take 1 tablet (250 mg total) by mouth daily.   Facility-Administered Encounter Medications as of 07/27/2024  Medication   [COMPLETED] 0.9 %  sodium chloride  infusion  pembrolizumab  (KEYTRUDA ) 200 mg in sodium chloride  0.9 % 50 mL chemo infusion     There were no vitals filed for this visit. There is no height or weight on file to calculate BMI.   ECOG PERFORMANCE STATUS: 1 - Symptomatic but completely ambulatory  PHYSICAL EXAM GENERAL:alert, no distress and comfortable SKIN: no rash  EYES: sclera clear LUNGS: clear with normal breathing effort HEART: regular rate & rhythm, no lower extremity edema ABDOMEN: abdomen soft, non-tender and normal bowel sounds NEURO: alert & oriented x 3 with fluent speech PAC without erythema    CBC    Latest Ref Rng & Units 07/27/2024    8:02 AM 07/05/2024    2:54 PM 06/24/2024   10:43 AM  CBC  WBC 4.0 - 10.5 K/uL 5.9  5.9  5.6   Hemoglobin 13.0 - 17.0 g/dL 87.4  87.9  88.3   Hematocrit 39.0 - 52.0 % 36.9  35.4  34.2   Platelets 150 - 400 K/uL 174  158  142       CMP     Latest Ref Rng & Units 07/27/2024    8:02 AM 07/05/2024    2:54 PM 06/24/2024   10:43 AM  CMP  Glucose 70 - 99 mg/dL 751  829  751   BUN 8 - 23 mg/dL 17  13  15    Creatinine 0.61 - 1.24 mg/dL 8.91  9.08  9.03   Sodium 135 - 145 mmol/L 138  137  138   Potassium 3.5 - 5.1 mmol/L 3.8  4.1  3.8   Chloride 98 - 111 mmol/L 103  102  105   CO2 22 - 32 mmol/L 25  25  28    Calcium  8.9 - 10.3 mg/dL 9.3  9.3  9.0   Total Protein 6.5 - 8.1  g/dL 7.2  7.2  6.7   Total Bilirubin 0.0 - 1.2 mg/dL 0.4  0.3  0.4   Alkaline Phos 38 - 126 U/L 57  63  54   AST 15 - 41 U/L 18  21  14    ALT 0 - 44 U/L 12  21  13        ASSESSMENT & PLAN:   Bladder cancer  -initially diagnosed in 2017  -He was found to have high-grade urothelial carcinoma without muscle invasion, 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  -S/p Intravesicular gemcitabine  weekly X6 started in February 2022  -bladder biopsies, bladder washings on 06/24/2022 showed focal urothelial carcinoma in situ (CIS) and cytology was positive for high grade urothelial carcinoma -S/p intravesical gemcitabine  weekly x 6, he started on 08/28/16.   -Due to his poor tolerance to intravesical gemcitabine , changed to intravesical mitomycin  40 mg weekly x 5 weeks. He tolerated the treatment very well  -His surveillance cystoscopy and biopsy were negative for residual cancer -Given his good response to intravesical mitomycin , and his high risk of recurrence, Dr. Matilda recommend intravesical therapy, started in in April 2024. Unfortunately he has had recurrent leakage from the intravesical therapy, and not able to continue. -Treatment was switched to Keytruda  on 03/18/23, he has been tolerating well overall.  Plan to continue every 3 weeks for up to 2 years if no recurrence.  Last cystoscopy 05/2024 was negative, repeat 11/2024    PLAN: -Mr. Sanders appears stable, tolerating Keytruda  without significant toxicities.  Able to recover and function well with adequate performance status -Urinary symptoms mostly at baseline, he knows to call urology with worsening symptoms or changes.  No clinical concern for recurrence -Labs reviewed, adequate to proceed with Keytruda , no dose modifications -Follow-up and next cycle in 3 weeks, or sooner if needed    All questions were answered. The patient knows to call the clinic with any problems, questions or concerns. No barriers to  learning were detected.   Cindi Ghazarian K Sabastien Tyler, NP 07/27/2024

## 2024-07-27 ENCOUNTER — Inpatient Hospital Stay

## 2024-07-27 ENCOUNTER — Inpatient Hospital Stay: Attending: Hematology

## 2024-07-27 ENCOUNTER — Inpatient Hospital Stay: Attending: Hematology | Admitting: Nurse Practitioner

## 2024-07-27 ENCOUNTER — Ambulatory Visit: Admitting: Cardiovascular Disease

## 2024-07-27 ENCOUNTER — Encounter: Payer: Self-pay | Admitting: Nurse Practitioner

## 2024-07-27 VITALS — BP 132/74 | HR 73 | Temp 98.2°F | Resp 16 | Wt 179.5 lb

## 2024-07-27 DIAGNOSIS — Z79620 Long term (current) use of immunosuppressive biologic: Secondary | ICD-10-CM | POA: Diagnosis not present

## 2024-07-27 DIAGNOSIS — Z5112 Encounter for antineoplastic immunotherapy: Secondary | ICD-10-CM | POA: Insufficient documentation

## 2024-07-27 DIAGNOSIS — C671 Malignant neoplasm of dome of bladder: Secondary | ICD-10-CM

## 2024-07-27 DIAGNOSIS — C670 Malignant neoplasm of trigone of bladder: Secondary | ICD-10-CM | POA: Diagnosis present

## 2024-07-27 LAB — CBC WITH DIFFERENTIAL (CANCER CENTER ONLY)
Abs Immature Granulocytes: 0.02 K/uL (ref 0.00–0.07)
Basophils Absolute: 0.1 K/uL (ref 0.0–0.1)
Basophils Relative: 1 %
Eosinophils Absolute: 0.3 K/uL (ref 0.0–0.5)
Eosinophils Relative: 5 %
HCT: 36.9 % — ABNORMAL LOW (ref 39.0–52.0)
Hemoglobin: 12.5 g/dL — ABNORMAL LOW (ref 13.0–17.0)
Immature Granulocytes: 0 %
Lymphocytes Relative: 18 %
Lymphs Abs: 1.1 K/uL (ref 0.7–4.0)
MCH: 29.9 pg (ref 26.0–34.0)
MCHC: 33.9 g/dL (ref 30.0–36.0)
MCV: 88.3 fL (ref 80.0–100.0)
Monocytes Absolute: 0.4 K/uL (ref 0.1–1.0)
Monocytes Relative: 7 %
Neutro Abs: 4 K/uL (ref 1.7–7.7)
Neutrophils Relative %: 69 %
Platelet Count: 174 K/uL (ref 150–400)
RBC: 4.18 MIL/uL — ABNORMAL LOW (ref 4.22–5.81)
RDW: 12.8 % (ref 11.5–15.5)
WBC Count: 5.9 K/uL (ref 4.0–10.5)
nRBC: 0 % (ref 0.0–0.2)

## 2024-07-27 LAB — CMP (CANCER CENTER ONLY)
ALT: 12 U/L (ref 0–44)
AST: 18 U/L (ref 15–41)
Albumin: 4.4 g/dL (ref 3.5–5.0)
Alkaline Phosphatase: 57 U/L (ref 38–126)
Anion gap: 10 (ref 5–15)
BUN: 17 mg/dL (ref 8–23)
CO2: 25 mmol/L (ref 22–32)
Calcium: 9.3 mg/dL (ref 8.9–10.3)
Chloride: 103 mmol/L (ref 98–111)
Creatinine: 1.08 mg/dL (ref 0.61–1.24)
GFR, Estimated: >60 mL/min (ref >60)
Glucose, Bld: 248 mg/dL — ABNORMAL HIGH (ref 70–99)
Potassium: 3.8 mmol/L (ref 3.5–5.1)
Sodium: 138 mmol/L (ref 135–145)
Total Bilirubin: 0.4 mg/dL (ref 0.0–1.2)
Total Protein: 7.2 g/dL (ref 6.5–8.1)

## 2024-07-27 LAB — TSH: TSH: 1.66 u[IU]/mL (ref 0.350–4.500)

## 2024-07-27 MED ORDER — SODIUM CHLORIDE 0.9 % IV SOLN: Freq: Once | INTRAVENOUS | Status: AC

## 2024-07-27 MED ORDER — SODIUM CHLORIDE 0.9 % IV SOLN
200.0000 mg | Freq: Once | INTRAVENOUS | Status: AC
  Administered 2024-07-27: 09:00:00 200 mg via INTRAVENOUS
  Filled 2024-07-27: qty 200

## 2024-07-27 MED ORDER — LIDOCAINE-PRILOCAINE 2.5-2.5 % EX CREA: 1.0000 | TOPICAL_CREAM | CUTANEOUS | 2 refills | Status: DC | PRN

## 2024-07-27 NOTE — Patient Instructions (Signed)

## 2024-07-28 LAB — T4: T4, Total: 9.8 ug/dL (ref 4.5–12.0)

## 2024-08-09 ENCOUNTER — Encounter (HOSPITAL_COMMUNITY): Payer: Self-pay | Admitting: Physician Assistant

## 2024-08-09 ENCOUNTER — Ambulatory Visit (HOSPITAL_COMMUNITY)
Admission: RE | Admit: 2024-08-09 | Discharge: 2024-08-09 | Disposition: A | Discharge status: Home / Self Care | Source: Ambulatory Visit | Attending: Physician Assistant | Admitting: Physician Assistant

## 2024-08-09 VITALS — BP 122/56 | HR 77 | Ht 70.0 in | Wt 181.4 lb

## 2024-08-09 DIAGNOSIS — D6859 Other primary thrombophilia: Secondary | ICD-10-CM

## 2024-08-09 DIAGNOSIS — I4891 Unspecified atrial fibrillation: Secondary | ICD-10-CM | POA: Diagnosis not present

## 2024-08-09 DIAGNOSIS — I5020 Unspecified systolic (congestive) heart failure: Secondary | ICD-10-CM

## 2024-08-09 DIAGNOSIS — I4819 Other persistent atrial fibrillation: Secondary | ICD-10-CM

## 2024-08-09 DIAGNOSIS — I700 Atherosclerosis of aorta: Secondary | ICD-10-CM

## 2024-08-09 DIAGNOSIS — I10 Essential (primary) hypertension: Secondary | ICD-10-CM

## 2024-08-09 NOTE — Patient Instructions (Addendum)
 Hold starting 08/11/24 wednesday  sitaGLIPtin [facility]) may resume after procedure    Hold morning Diabetic meds    Cardioversion scheduled for: 08/13/24 Friday at 9:30 am   - Arrive at the [employer] [facility] ([address])  and check in with ADMITTING at 9:30 am    - Do not eat or drink anything after midnight the night prior to your procedure.   - Take all your morning medication (except diabetic medications) with a sip of water  prior to arrival.  - Do NOT miss any doses of your blood thinner - if you should miss a dose or take a dose more than 4 hours late -- please notify our office immediately.  - You will not be able to drive home after your procedure. Please ensure you have a responsible adult to drive you home. You will need someone with you for 24 hours post procedure.     - Expect to be in the procedural area approximately 2 hours.   - If you feel as if you go back into normal rhythm prior to scheduled cardioversion, please notify our office immediately.   If your procedure is canceled in the cardioversion suite you will be charged a cancellation fee.      Hold below medications 72 hours prior to scheduled procedure/anesthesia. Restart medication on the following day after scheduled procedure/anesthesia Ertugliflozin/sitagliptin (Steglujan)      For those patients who have a scheduled procedure/anesthesia on the same day of the week as their dose, hold the medication on the day of surgery.  They can take their scheduled dose the week before.  **Patients on the above medications scheduled for elective procedures that have not held the medication for the appropriate amount of time are at risk of cancellation or change in the anesthetic plan.

## 2024-08-09 NOTE — Progress Notes (Signed)
 "   Primary Care Physician: Valentin Skates, DO Primary Cardiologist: Dorn Lesches, MD Electrophysiologist: None  Referring Physician: Dr. Lesches Keith [name] is a 83 y.o. male with a history of PAF, HTN, Aortic Arthrosclerosis, HLD, hypothyroidism, DM type II, GERD, bladder CA s/p TURBT 07/2020 who presents for follow up in the [facility].  The patient was initially diagnosed with atrial fibrillation during recent preop EKG for elective back surgery.  He was seen by Dr.Berry on 07/12/2024 and reported no symptoms associated with AF.  He was referred today to discuss initiation of anticoagulation and possible DCCV for treatment.  He completed a 2D echo on 06/14/2024 showing EF of 45-50% with possible decrease in LV function associated with A-fib.  He has a history of hypothyroidism and is currently on Synthroid  with stable TSH by most recent labs.  Keith [name] was seen in the A-fib clinic on 07/16/2024 to establish care and during visit was found to be in atrial fibrillation but was asymptomatic.  He was started on Eliquis  5 mg twice daily for CHA2DS2-VASc score of 6 and treatment options were discussed with patient agreeing to follow-up in 3 weeks.  We discussed pursuing DCCV if patient was still in atrial fibrillation at follow-up.  We will also discuss pursuing sleep study at his follow-up visit as well.  Keith [name] presents today with his wife for 4-week follow-up.  He is unfortunately still in atrial fibrillation but with a controlled rate.  He reports some increase in fatigue but is overall asymptomatic.  He has been compliant with his Eliquis  and denies any missed doses since previous follow-up.  He also denies any bleeding in urine or stool since initiating medication. We discussed treatment strategies as outlined above and patient is fine with proceeding with DCCV at this time.  We reviewed triggers for atrial fibrillation as well and patient is aware to abstain  from EtOH and is interested in pursuing sleep study in the future.  Atrial Fibrillation Risk Factors: Needs sleep study he does have a history of alcohol use and reports 1-2 drinks monthly only  Atrial Fibrillation Management history:  Previous antiarrhythmic drugs: None Previous cardioversions: None Previous ablations: None Anticoagulation history: None  ROS- All systems are reviewed and negative except as per the HPI above.  Past Medical History:  Diagnosis Date   Bladder cancer (HCC)    Blind right eye    BPH (benign prostatic hypertrophy)    Cataracts, both eyes    Depression    DM type 2 (diabetes mellitus, type 2) (HCC)    DOE (dyspnea on exertion)    with heavy exertion   Full dentures    FUO (fever of unknown origin) 04/20/2024   GERD (gastroesophageal reflux disease)    Glaucoma no peripheral or side vision   right eye blind   Glaucoma, right eye    HOH (hard of hearing)    Hyperlipidemia    Hypertension    Hypothyroidism    Local recurrence of cancer of urinary bladder [facility]) urologsit-  dr matilda   1st dx 01-29-2016--  now recurrent 05-31-2016   Lower extremity edema 07/11/2020   right swells more than left goes down at night after sleeping   Lower urinary tract symptoms (LUTS) 2019   Lumbar stenosis 05/24/2024   Migraine    Peripheral neuropathy    middle finger left hand   Pneumonia 2019   Skin abnormality    base of spine looks like  pimple has had since 07-01-2020, no drainage area intact   Wears glasses     Current Outpatient Medications  Medication Sig Dispense Refill   acetaminophen  (TYLENOL ) 650 MG CR tablet Take 1,300 mg by mouth every 8 (eight) hours as needed for pain. (Patient taking differently: Take 1,300 mg by mouth as needed for pain.)     amLODipine  (NORVASC ) 10 MG tablet Take 10 mg by mouth daily.     apixaban  (ELIQUIS ) 5 MG TABS tablet Take 1 tablet (5 mg total) by mouth 2 (two) times daily. 60 tablet 3   atorvastatin  (LIPITOR) 20  MG tablet Take 20 mg by mouth at bedtime.      Blood Glucose Monitoring Suppl (ACCU-CHEK AVIVA PLUS) w/Device KIT To check blood sugar vitro three times a day; Duration: 90 days     carteolol (OCUPRESS) 1 % ophthalmic solution Place 1 drop into the right eye every morning.     cyanocobalamin  (VITAMIN B12) 1000 MCG tablet Take 1,000 mcg by mouth daily.     glimepiride  (AMARYL ) 1 MG tablet He will only take if sugar is above 150 - 1 mg total     glucose blood (ACCU-CHEK AVIVA PLUS) test strip To check blood sugars In Vitro two times a day; Duration: 90 days     latanoprost (XALATAN) 0.005 % ophthalmic solution Place 1 drop into the right eye at bedtime.     levothyroxine  (SYNTHROID , LEVOTHROID) 100 MCG tablet Take 100 mcg by mouth daily before breakfast.      lidocaine -prilocaine  (EMLA ) cream Apply 1 Application topically as needed. 30 g 2   loratadine  (CLARITIN ) 10 MG tablet Take 10 mg by mouth daily as needed for allergies. (Patient taking differently: Take 10 mg by mouth as needed for allergies.)     losartan  (COZAAR ) 100 MG tablet Take 100 mg by mouth at bedtime.      meloxicam (MOBIC) 15 MG tablet Take 15 mg by mouth daily as needed for pain. (Patient taking differently: Take 15 mg by mouth every other day.)     omeprazole (PRILOSEC) 20 MG capsule Take 20 mg by mouth daily.     ondansetron  (ZOFRAN ) 8 MG tablet Take 1 tablet (8 mg total) by mouth every 8 (eight) hours as needed for nausea or vomiting. (Patient taking differently: Take 8 mg by mouth as needed for nausea or vomiting.) 30 tablet 1   OVER THE COUNTER MEDICATION Take 2 capsules by mouth daily. OmegaXL (perna canaliculus oil + PCSO-524)     oxymetazoline (AFRIN) 0.05 % nasal spray Place 1 spray into both nostrils 2 (two) times daily. (Patient taking differently: Place 1 spray into both nostrils as needed.)     pembrolizumab  (KEYTRUDA ) 100 MG/4ML SOLN Inject into the vein every 21 ( twenty-one) days.     propranolol  (INDERAL ) 20 MG  tablet TAKE 1 TABLET BY MOUTH TWICE A DAY 180 tablet 1   sitaGLIPtin (JANUVIA) 100 MG tablet Take 100 mg by mouth every morning.     tamsulosin  (FLOMAX ) 0.4 MG CAPS capsule Take 0.4 mg by mouth every morning.      terbinafine  (LAMISIL ) 250 MG tablet Take 1 tablet (250 mg total) by mouth daily. 30 tablet 3   No current facility-administered medications for this encounter.    Physical Exam: BP (!) 122/56   Pulse 77   Ht 5' 10 (1.778 m)   Wt 82.3 kg   BMI 26.03 kg/m   GEN: Well nourished, well developed in no acute distress NECK:  No JVD; No carotid bruits CARDIAC: Irregularly irregular rate and rhythm, no murmurs, rubs, gallops RESPIRATORY:  Clear to auscultation without rales, wheezing or rhonchi  ABDOMEN: Soft, non-tender, non-distended EXTREMITIES:  No edema; No deformity   Wt Readings from Last 3 Encounters:  08/09/24 82.3 kg  07/27/24 81.4 kg  07/16/24 82.1 kg     EKG today demonstrates:   EKG Interpretation Date/Time:  Monday August 09 2024 10:17:19 EST Ventricular Rate:  77 PR Interval:    QRS Duration:  170 QT Interval:  442 QTC Calculation: 500 R Axis:   -19  Text Interpretation: Atrial fibrillation Right bundle branch block Abnormal ECG Confirmed by Wyn Manus [phone]) on 08/09/2024 11:09:48 AM        Echo 06/15/2024 demonstrated  1. Left ventricular ejection fraction, by estimation, is 45 to 50%. The  left ventricle has mildly decreased function. The left ventricle  demonstrates global hypokinesis. The left ventricular internal cavity size  was mildly dilated. Left ventricular  diastolic parameters are indeterminate.   2. Right ventricular systolic function is normal. The right ventricular  size is normal. There is normal pulmonary artery systolic pressure. The  estimated right ventricular systolic pressure is 33.4 mmHg.   3. The mitral valve is normal in structure. Trivial mitral valve  regurgitation. No evidence of mitral stenosis.   4. The aortic  valve is normal in structure. Aortic valve regurgitation is  not visualized. No aortic stenosis is present.   5. The inferior vena cava is normal in size with <50% respiratory  variability, suggesting right atrial pressure of 8 mmHg.   6. Possible atrial fibrillation.    CHA2DS2-VASc Score = 6  The patient's score is based upon: CHF History: 1 HTN History: 1 Diabetes History: 1 Stroke History: 0 Vascular Disease History: 1 Age Score: 2 Gender Score: 0      ASSESSMENT AND PLAN: Persistent Atrial Fibrillation (ICD10:  I48.19) The patient's CHA2DS2-VASc score is 6, indicating a 9.7% annual risk of stroke.   -Today patient is asymptomatic in rate controlled atrial fibrillation -Patient has not missed any doses of Eliquis  and through shared decision we will pursue DCCV to restore sinus rhythm. -Discussed sleep study with patient and will consider at follow-up if interested as well as pursuing antiarrhythmic medications if ERAF - Continue Eliquis  5 mg twice daily - Continue propranolol  20 mg twice daily  Secondary Hypercoagulable State (ICD10:  D68.69) The patient is at significant risk for stroke/thromboembolism based upon his CHA2DS2-VASc Score of 6.   -Reports no bleeding in urine or stool Continue Eliquis  5 mg twice daily  Primary HTN: - Blood pressure well-controlled on current regimen  HFmrEF: - Recent 2D echo completed while in atrial fibrillation showing EF of 45-50% with plan to repeat once sinus rhythm is obtained.  Aortic Arthrosclerosis: - Continue Lipitor 20 mg  Follow-up in 3 months   Informed Consent   Shared Decision Making/Informed Consent The risks (stroke, cardiac arrhythmias rarely resulting in the need for a temporary or permanent pacemaker, skin irritation or burns and complications associated with conscious sedation including aspiration, arrhythmia, respiratory failure and death), benefits (restoration of normal sinus rhythm) and alternatives of a direct  current cardioversion were explained in detail to Keith [name] and he agrees to proceed.         Manus Wyn, NP-C [facility] [address] [place], [place] [address] [phone] "

## 2024-08-09 NOTE — H&P (View-Only) (Signed)
 "   Primary Care Physician: Valentin Skates, DO Primary Cardiologist: Dorn Lesches, MD Electrophysiologist: None  Referring Physician: Dr. Lesches Keith [name] Keith [name] is a 83 y.o. male with a history of PAF, HTN, Aortic Arthrosclerosis, HLD, hypothyroidism, DM type II, GERD, bladder CA s/p TURBT 07/2020 who presents for follow up in the [facility].  The patient was initially diagnosed with atrial fibrillation during recent preop EKG for elective back surgery.  He was seen by Dr.Berry on 07/12/2024 and reported no symptoms associated with AF.  He was referred today to discuss initiation of anticoagulation and possible DCCV for treatment.  He completed a 2D echo on 06/14/2024 showing EF of 45-50% with possible decrease in LV function associated with A-fib.  He has a history of hypothyroidism and is currently on Synthroid  with stable TSH by most recent labs.  Keith [name] was seen in the A-fib clinic on 07/16/2024 to establish care and during visit was found to be in atrial fibrillation but was asymptomatic.  He was started on Eliquis  5 mg twice daily for CHA2DS2-VASc score of 6 and treatment options were discussed with patient agreeing to follow-up in 3 weeks.  We discussed pursuing DCCV if patient was still in atrial fibrillation at follow-up.  We will also discuss pursuing sleep study at his follow-up visit as well.  Keith [name] presents today with his wife for 4-week follow-up.  He is unfortunately still in atrial fibrillation but with a controlled rate.  He reports some increase in fatigue but is overall asymptomatic.  He has been compliant with his Eliquis  and denies any missed doses since previous follow-up.  He also denies any bleeding in urine or stool since initiating medication. We discussed treatment strategies as outlined above and patient is fine with proceeding with DCCV at this time.  We reviewed triggers for atrial fibrillation as well and patient is aware to abstain  from EtOH and is interested in pursuing sleep study in the future.  Atrial Fibrillation Risk Factors: Needs sleep study he does have a history of alcohol use and reports 1-2 drinks monthly only  Atrial Fibrillation Management history:  Previous antiarrhythmic drugs: None Previous cardioversions: None Previous ablations: None Anticoagulation history: None  ROS- All systems are reviewed and negative except as per the HPI above.  Past Medical History:  Diagnosis Date   Bladder cancer (HCC)    Blind right eye    BPH (benign prostatic hypertrophy)    Cataracts, both eyes    Depression    DM type 2 (diabetes mellitus, type 2) (HCC)    DOE (dyspnea on exertion)    with heavy exertion   Full dentures    FUO (fever of unknown origin) 04/20/2024   GERD (gastroesophageal reflux disease)    Glaucoma no peripheral or side vision   right eye blind   Glaucoma, right eye    HOH (hard of hearing)    Hyperlipidemia    Hypertension    Hypothyroidism    Local recurrence of cancer of urinary bladder [facility]) urologsit-  dr matilda   1st dx 01-29-2016--  now recurrent 05-31-2016   Lower extremity edema 07/11/2020   right swells more than left goes down at night after sleeping   Lower urinary tract symptoms (LUTS) 2019   Lumbar stenosis 05/24/2024   Migraine    Peripheral neuropathy    middle finger left hand   Pneumonia 2019   Skin abnormality    base of spine looks like  pimple has had since 07-01-2020, no drainage area intact   Wears glasses     Current Outpatient Medications  Medication Sig Dispense Refill   acetaminophen  (TYLENOL ) 650 MG CR tablet Take 1,300 mg by mouth every 8 (eight) hours as needed for pain. (Patient taking differently: Take 1,300 mg by mouth as needed for pain.)     amLODipine  (NORVASC ) 10 MG tablet Take 10 mg by mouth daily.     apixaban  (ELIQUIS ) 5 MG TABS tablet Take 1 tablet (5 mg total) by mouth 2 (two) times daily. 60 tablet 3   atorvastatin  (LIPITOR) 20  MG tablet Take 20 mg by mouth at bedtime.      Blood Glucose Monitoring Suppl (ACCU-CHEK AVIVA PLUS) w/Device KIT To check blood sugar vitro three times a day; Duration: 90 days     carteolol (OCUPRESS) 1 % ophthalmic solution Place 1 drop into the right eye every morning.     cyanocobalamin  (VITAMIN B12) 1000 MCG tablet Take 1,000 mcg by mouth daily.     glimepiride  (AMARYL ) 1 MG tablet He will only take if sugar is above 150 - 1 mg total     glucose blood (ACCU-CHEK AVIVA PLUS) test strip To check blood sugars In Vitro two times a day; Duration: 90 days     latanoprost (XALATAN) 0.005 % ophthalmic solution Place 1 drop into the right eye at bedtime.     levothyroxine  (SYNTHROID , LEVOTHROID) 100 MCG tablet Take 100 mcg by mouth daily before breakfast.      lidocaine -prilocaine  (EMLA ) cream Apply 1 Application topically as needed. 30 g 2   loratadine  (CLARITIN ) 10 MG tablet Take 10 mg by mouth daily as needed for allergies. (Patient taking differently: Take 10 mg by mouth as needed for allergies.)     losartan  (COZAAR ) 100 MG tablet Take 100 mg by mouth at bedtime.      meloxicam (MOBIC) 15 MG tablet Take 15 mg by mouth daily as needed for pain. (Patient taking differently: Take 15 mg by mouth every other day.)     omeprazole (PRILOSEC) 20 MG capsule Take 20 mg by mouth daily.     ondansetron  (ZOFRAN ) 8 MG tablet Take 1 tablet (8 mg total) by mouth every 8 (eight) hours as needed for nausea or vomiting. (Patient taking differently: Take 8 mg by mouth as needed for nausea or vomiting.) 30 tablet 1   OVER THE COUNTER MEDICATION Take 2 capsules by mouth daily. OmegaXL (perna canaliculus oil + PCSO-524)     oxymetazoline (AFRIN) 0.05 % nasal spray Place 1 spray into both nostrils 2 (two) times daily. (Patient taking differently: Place 1 spray into both nostrils as needed.)     pembrolizumab  (KEYTRUDA ) 100 MG/4ML SOLN Inject into the vein every 21 ( twenty-one) days.     propranolol  (INDERAL ) 20 MG  tablet TAKE 1 TABLET BY MOUTH TWICE A DAY 180 tablet 1   sitaGLIPtin (JANUVIA) 100 MG tablet Take 100 mg by mouth every morning.     tamsulosin  (FLOMAX ) 0.4 MG CAPS capsule Take 0.4 mg by mouth every morning.      terbinafine  (LAMISIL ) 250 MG tablet Take 1 tablet (250 mg total) by mouth daily. 30 tablet 3   No current facility-administered medications for this encounter.    Physical Exam: BP (!) 122/56   Pulse 77   Ht 5' 10 (1.778 m)   Wt 82.3 kg   BMI 26.03 kg/m   GEN: Well nourished, well developed in no acute distress NECK:  No JVD; No carotid bruits CARDIAC: Irregularly irregular rate and rhythm, no murmurs, rubs, gallops RESPIRATORY:  Clear to auscultation without rales, wheezing or rhonchi  ABDOMEN: Soft, non-tender, non-distended EXTREMITIES:  No edema; No deformity   Wt Readings from Last 3 Encounters:  08/09/24 82.3 kg  07/27/24 81.4 kg  07/16/24 82.1 kg     EKG today demonstrates:   EKG Interpretation Date/Time:  Monday August 09 2024 10:17:19 EST Ventricular Rate:  77 PR Interval:    QRS Duration:  170 QT Interval:  442 QTC Calculation: 500 R Axis:   -19  Text Interpretation: Atrial fibrillation Right bundle branch block Abnormal ECG Confirmed by Wyn Manus [phone]) on 08/09/2024 11:09:48 AM        Echo 06/15/2024 demonstrated  1. Left ventricular ejection fraction, by estimation, is 45 to 50%. The  left ventricle has mildly decreased function. The left ventricle  demonstrates global hypokinesis. The left ventricular internal cavity size  was mildly dilated. Left ventricular  diastolic parameters are indeterminate.   2. Right ventricular systolic function is normal. The right ventricular  size is normal. There is normal pulmonary artery systolic pressure. The  estimated right ventricular systolic pressure is 33.4 mmHg.   3. The mitral valve is normal in structure. Trivial mitral valve  regurgitation. No evidence of mitral stenosis.   4. The aortic  valve is normal in structure. Aortic valve regurgitation is  not visualized. No aortic stenosis is present.   5. The inferior vena cava is normal in size with <50% respiratory  variability, suggesting right atrial pressure of 8 mmHg.   6. Possible atrial fibrillation.    CHA2DS2-VASc Score = 6  The patient's score is based upon: CHF History: 1 HTN History: 1 Diabetes History: 1 Stroke History: 0 Vascular Disease History: 1 Age Score: 2 Gender Score: 0      ASSESSMENT AND PLAN: Persistent Atrial Fibrillation (ICD10:  I48.19) The patient's CHA2DS2-VASc score is 6, indicating a 9.7% annual risk of stroke.   -Today patient is asymptomatic in rate controlled atrial fibrillation -Patient has not missed any doses of Eliquis  and through shared decision we will pursue DCCV to restore sinus rhythm. -Discussed sleep study with patient and will consider at follow-up if interested as well as pursuing antiarrhythmic medications if ERAF - Continue Eliquis  5 mg twice daily - Continue propranolol  20 mg twice daily  Secondary Hypercoagulable State (ICD10:  D68.69) The patient is at significant risk for stroke/thromboembolism based upon his CHA2DS2-VASc Score of 6.   -Reports no bleeding in urine or stool Continue Eliquis  5 mg twice daily  Primary HTN: - Blood pressure well-controlled on current regimen  HFmrEF: - Recent 2D echo completed while in atrial fibrillation showing EF of 45-50% with plan to repeat once sinus rhythm is obtained.  Aortic Arthrosclerosis: - Continue Lipitor 20 mg  Follow-up in 3 months   Informed Consent   Shared Decision Making/Informed Consent The risks (stroke, cardiac arrhythmias rarely resulting in the need for a temporary or permanent pacemaker, skin irritation or burns and complications associated with conscious sedation including aspiration, arrhythmia, respiratory failure and death), benefits (restoration of normal sinus rhythm) and alternatives of a direct  current cardioversion were explained in detail to Mr. Helzer and he agrees to proceed.         Manus Wyn, NP-C [facility] [address] [place], [place] [address] [phone] "

## 2024-08-10 ENCOUNTER — Other Ambulatory Visit: Payer: Self-pay

## 2024-08-11 NOTE — Progress Notes (Signed)
 Pt's wife returned pre-procedure call. Confirmed with this RN of arrival time, the pt has not missed a dose of Eliquis , will be NPO at midnight, and she will be the one to drive him home.

## 2024-08-13 ENCOUNTER — Ambulatory Visit (HOSPITAL_COMMUNITY)

## 2024-08-13 ENCOUNTER — Other Ambulatory Visit: Payer: Self-pay

## 2024-08-13 ENCOUNTER — Ambulatory Visit (HOSPITAL_COMMUNITY)
Admission: RE | Admit: 2024-08-13 | Discharge: 2024-08-13 | Disposition: A | Discharge status: Home / Self Care | Attending: Cardiovascular Disease | Admitting: Cardiovascular Disease

## 2024-08-13 ENCOUNTER — Encounter (HOSPITAL_COMMUNITY)
Admission: RE | Disposition: A | Discharge status: Home / Self Care | Payer: Self-pay | Source: Home / Self Care | Attending: Cardiovascular Disease

## 2024-08-13 DIAGNOSIS — I5020 Unspecified systolic (congestive) heart failure: Secondary | ICD-10-CM | POA: Diagnosis not present

## 2024-08-13 DIAGNOSIS — E785 Hyperlipidemia, unspecified: Secondary | ICD-10-CM | POA: Insufficient documentation

## 2024-08-13 DIAGNOSIS — I700 Atherosclerosis of aorta: Secondary | ICD-10-CM | POA: Diagnosis not present

## 2024-08-13 DIAGNOSIS — Z7901 Long term (current) use of anticoagulants: Secondary | ICD-10-CM | POA: Diagnosis not present

## 2024-08-13 DIAGNOSIS — Z7984 Long term (current) use of oral hypoglycemic drugs: Secondary | ICD-10-CM | POA: Insufficient documentation

## 2024-08-13 DIAGNOSIS — Z79899 Other long term (current) drug therapy: Secondary | ICD-10-CM | POA: Insufficient documentation

## 2024-08-13 DIAGNOSIS — I4819 Other persistent atrial fibrillation: Secondary | ICD-10-CM

## 2024-08-13 DIAGNOSIS — K219 Gastro-esophageal reflux disease without esophagitis: Secondary | ICD-10-CM | POA: Insufficient documentation

## 2024-08-13 DIAGNOSIS — I110 Hypertensive heart disease with heart failure: Secondary | ICD-10-CM | POA: Insufficient documentation

## 2024-08-13 DIAGNOSIS — E039 Hypothyroidism, unspecified: Secondary | ICD-10-CM

## 2024-08-13 DIAGNOSIS — E119 Type 2 diabetes mellitus without complications: Secondary | ICD-10-CM | POA: Diagnosis not present

## 2024-08-13 DIAGNOSIS — I4891 Unspecified atrial fibrillation: Secondary | ICD-10-CM | POA: Diagnosis not present

## 2024-08-13 DIAGNOSIS — Z79890 Hormone replacement therapy (postmenopausal): Secondary | ICD-10-CM | POA: Diagnosis not present

## 2024-08-13 DIAGNOSIS — Z87891 Personal history of nicotine dependence: Secondary | ICD-10-CM | POA: Diagnosis not present

## 2024-08-13 DIAGNOSIS — I10 Essential (primary) hypertension: Secondary | ICD-10-CM | POA: Diagnosis not present

## 2024-08-13 DIAGNOSIS — Z8551 Personal history of malignant neoplasm of bladder: Secondary | ICD-10-CM | POA: Insufficient documentation

## 2024-08-13 DIAGNOSIS — D6869 Other thrombophilia: Secondary | ICD-10-CM | POA: Insufficient documentation

## 2024-08-13 HISTORY — PX: CARDIOVERSION: EP1203

## 2024-08-13 MED ORDER — PROPOFOL 10 MG/ML IV BOLUS
INTRAVENOUS | Status: DC | PRN
  Administered 2024-08-13: 40 mg via INTRAVENOUS

## 2024-08-13 MED ORDER — LIDOCAINE 2% (20 MG/ML) 5 ML SYRINGE
INTRAMUSCULAR | Status: DC | PRN
  Administered 2024-08-13: 80 mg via INTRAVENOUS

## 2024-08-13 MED ORDER — SODIUM CHLORIDE 0.9% FLUSH
INTRAVENOUS | Status: DC | PRN
  Administered 2024-08-13: 10 mL via INTRAVENOUS

## 2024-08-13 MED ORDER — SODIUM CHLORIDE 0.9 % IV SOLN: INTRAVENOUS | Status: DC

## 2024-08-13 NOTE — Transfer of Care (Signed)
 Immediate Anesthesia Transfer of Care Note  Patient: Keith [name]  Procedure(s) Performed: CARDIOVERSION  Patient Location: PACU and Cath Lab  Anesthesia Type:MAC  Level of Consciousness: drowsy  Airway & Oxygen Therapy: Patient Spontanous Breathing and Patient connected to nasal cannula oxygen  Post-op Assessment: Report given to RN and Post -op Vital signs reviewed and stable  Post vital signs: Reviewed and stable  Last Vitals:  Vitals Value Taken Time  BP 136/65 08/13/24 1133  Temp    Pulse 61 08/13/24 1133  Resp 11 08/13/24 1133  SpO2 95% 08/13/24 1133    Last Pain:  Vitals:   08/13/24 0934  TempSrc: Temporal         Complications: There were no known notable events for this encounter.

## 2024-08-13 NOTE — Op Note (Signed)
 Procedure: Electrical Cardioversion Indications:  Atrial Fibrillation  Procedure Details:  Consent: Risks of procedure as well as the alternatives and risks of each were explained to the (patient/caregiver).  Consent for procedure obtained.  Time Out: Verified patient identification, verified procedure, site/side was marked, verified correct patient position, special equipment/implants available, medications/allergies/relevent history reviewed, required imaging and test results available.  Performed  Patient placed on cardiac monitor, pulse oximetry, supplemental oxygen as necessary.  Sedation given: Propofol  IV, Dr. Keneth Pacer pads placed anterior and posterior chest.  Cardioverted 1 time(s).  Cardioversion with synchronized biphasic 200J shock.  Evaluation: Findings: Post procedure EKG shows: NSR Complications: None Patient did tolerate procedure well.  Time Spent Directly with the Patient:  30 minutes   Keith [name] 08/13/2024, 11:31 AM

## 2024-08-13 NOTE — Anesthesia Postprocedure Evaluation (Signed)
"   Anesthesia Post Note  Patient: Keith [name]  Procedure(s) Performed: CARDIOVERSION     Patient location during evaluation: PACU Anesthesia Type: MAC Level of consciousness: awake and alert Pain management: pain level controlled Vital Signs Assessment: post-procedure vital signs reviewed and stable Respiratory status: spontaneous breathing, nonlabored ventilation, respiratory function stable and patient connected to nasal cannula oxygen Cardiovascular status: blood pressure returned to baseline and stable Postop Assessment: no apparent nausea or vomiting Anesthetic complications: no   There were no known notable events for this encounter.  Last Vitals:  Vitals:   08/13/24 1142 08/13/24 1152  BP: 122/61 (!) 140/62  Pulse: 64 68  Resp: 19 15  Temp:    SpO2: 97% 96%    Last Pain:  Vitals:   08/13/24 1152  TempSrc:   PainSc: 0-No pain                 [name] Cornea      "

## 2024-08-13 NOTE — Discharge Instructions (Signed)

## 2024-08-13 NOTE — Anesthesia Preprocedure Evaluation (Signed)
"                                    Anesthesia Evaluation  Patient identified by MRN, date of birth, ID band Patient awake    Reviewed: Allergy & Precautions, NPO status , Patient's Chart, lab work & pertinent test results, reviewed documented beta blocker date and time   History of Anesthesia Complications Negative for: history of anesthetic complications  Airway Mallampati: II  TM Distance: >3 FB     Dental  (+) Upper Dentures, Lower Dentures   Pulmonary neg shortness of breath, pneumonia, neg COPD, former smoker   breath sounds clear to auscultation       Cardiovascular hypertension, (-) angina + DOE  (-) CAD and (-) Past MI + dysrhythmias Atrial Fibrillation  Rhythm:Irregular Rate:Normal     Neuro/Psych  Headaches PSYCHIATRIC DISORDERS  Depression     Neuromuscular disease    GI/Hepatic ,GERD  ,,(+) neg Cirrhosis        Endo/Other  diabetes, Type 2Hypothyroidism    Renal/GU Renal disease     Musculoskeletal   Abdominal   Peds  Hematology   Anesthesia Other Findings   Reproductive/Obstetrics                              Anesthesia Physical Anesthesia Plan  ASA: 2  Anesthesia Plan: MAC   Post-op Pain Management:    Induction: Intravenous  PONV Risk Score and Plan: 2 and Ondansetron  and Propofol  infusion  Airway Management Planned: Natural Airway and Nasal Cannula  Additional Equipment:   Intra-op Plan:   Post-operative Plan:   Informed Consent: I have reviewed the patients History and Physical, chart, labs and discussed the procedure including the risks, benefits and alternatives for the proposed anesthesia with the patient or authorized representative who has indicated his/her understanding and acceptance.     Dental advisory given  Plan Discussed with: CRNA  Anesthesia Plan Comments:         Anesthesia Quick Evaluation  "

## 2024-08-13 NOTE — Interval H&P Note (Signed)
 History and Physical Interval Note:  08/13/2024 9:42 AM  Keith [name]  has presented today for surgery, with the diagnosis of afib.  The various methods of treatment have been discussed with the patient and family. After consideration of risks, benefits and other options for treatment, the patient has consented to  Procedures: CARDIOVERSION (N/A) as a surgical intervention.  The patient's history has been reviewed, patient examined, no change in status, stable for surgery.  I have reviewed the patient's chart and labs.  Questions were answered to the patient's satisfaction.     [name]

## 2024-08-14 ENCOUNTER — Encounter (HOSPITAL_COMMUNITY): Payer: Self-pay | Admitting: Cardiovascular Disease

## 2024-08-16 ENCOUNTER — Encounter: Payer: Self-pay | Admitting: Cardiovascular Disease

## 2024-08-16 ENCOUNTER — Ambulatory Visit: Attending: Cardiovascular Disease | Admitting: Cardiovascular Disease

## 2024-08-16 VITALS — BP 118/56 | HR 74 | Wt 178.0 lb

## 2024-08-16 DIAGNOSIS — I4819 Other persistent atrial fibrillation: Secondary | ICD-10-CM | POA: Diagnosis not present

## 2024-08-16 DIAGNOSIS — I10 Essential (primary) hypertension: Secondary | ICD-10-CM

## 2024-08-16 DIAGNOSIS — I519 Heart disease, unspecified: Secondary | ICD-10-CM

## 2024-08-16 DIAGNOSIS — E782 Mixed hyperlipidemia: Secondary | ICD-10-CM

## 2024-08-16 DIAGNOSIS — I739 Peripheral vascular disease, unspecified: Secondary | ICD-10-CM | POA: Diagnosis not present

## 2024-08-16 DIAGNOSIS — Z720 Tobacco use: Secondary | ICD-10-CM | POA: Diagnosis not present

## 2024-08-16 NOTE — Assessment & Plan Note (Signed)
 History of essential hypertension blood pressure measured today 118/56.  He is on amlodipine  and losartan  as well as propranolol .

## 2024-08-16 NOTE — Assessment & Plan Note (Signed)
 Ongoing tobacco abuse of at least 4 cigarettes a day.

## 2024-08-16 NOTE — Patient Instructions (Addendum)
 Medication Instructions:  Your physician recommends that you continue on your current medications as directed. Please refer to the Current Medication list given to you today.  *If you need a refill on your cardiac medications before your next appointment, please call your pharmacy*  Lab Work: Your physician recommends that you return for lab work in: the next week or 2 for FASTING lipid/liver panel  If you have labs (blood work) drawn today and your tests are completely normal, you will receive your results only by: MyChart Message (if you have MyChart) OR A paper copy in the mail If you have any lab test that is abnormal or we need to change your treatment, we will call you to review the results.  Testing/Procedures: Your physician has requested that you have an Aorta/Iliac Duplex. This will take place at [address], 4th floor  No food after 11PM the night before.  Water  is OK. (Don't drink liquids if you have been instructed not to for ANOTHER test) Avoid foods that produce bowel gas, for 24 hours prior to exam (see below). No breakfast, no chewing gum, no smoking or carbonated beverages. Patient may take morning medications with water . Come in for test at least 15 minutes early to register.  Please note: We ask at that you not bring children with you during ultrasound (echo/ vascular) testing. Due to room size and safety concerns, children are not allowed in the ultrasound rooms during exams. Our front office staff cannot provide observation of children in our lobby area while testing is being conducted. An adult accompanying a patient to their appointment will only be allowed in the ultrasound room at the discretion of the ultrasound technician under special circumstances. We apologize for any inconvenience.  Your physician has requested that you have an ankle brachial index (ABI). During this test an ultrasound and blood pressure cuff are used to evaluate the arteries that supply  the arms and legs with blood. Allow thirty minutes for this exam. There are no restrictions or special instructions. This will take place at [address] 4th floor   Please note: We ask at that you not bring children with you during ultrasound (echo/ vascular) testing. Due to room size and safety concerns, children are not allowed in the ultrasound rooms during exams. Our front office staff cannot provide observation of children in our lobby area while testing is being conducted. An adult accompanying a patient to their appointment will only be allowed in the ultrasound room at the discretion of the ultrasound technician under special circumstances. We apologize for any inconvenience.   Your physician has requested that you have an echocardiogram. Echocardiography is a painless test that uses sound waves to create images of your heart. It provides your doctor with information about the size and shape of your heart and how well your hearts chambers and valves are working. This procedure takes approximately one hour. There are no restrictions for this procedure. Please do NOT wear cologne, perfume, aftershave, or lotions (deodorant is allowed). Please arrive 15 minutes prior to your appointment time.  Please note: We ask at that you not bring children with you during ultrasound (echo/ vascular) testing. Due to room size and safety concerns, children are not allowed in the ultrasound rooms during exams. Our front office staff cannot provide observation of children in our lobby area while testing is being conducted. An adult accompanying a patient to their appointment will only be allowed in the ultrasound room at the discretion of  the ultrasound technician under special circumstances. We apologize for any inconvenience. **To do in April**   Follow-Up: At [facility], you and your health needs are our priority.  As part of our continuing mission to provide you with exceptional heart care,  our providers are all part of one team.  This team includes your primary Cardiologist (physician) and Advanced Practice Providers or APPs (Physician Assistants and Nurse Practitioners) who all work together to provide you with the care you need, when you need it.  Your next appointment:   6 month(s)  Provider:   Dorn Lesches, MD

## 2024-08-16 NOTE — Assessment & Plan Note (Signed)
 History of hyperlipidemia on statin therapy lipid profile performed 07/04/2023 revealing a total cholesterol of 108, LDL of 55 and HDL 27.  We will repeat a fasting lipid liver profile.

## 2024-08-16 NOTE — Assessment & Plan Note (Signed)
 Left ventricular dysfunction with an ejection fraction of 45 to 50% performed 06/14/2024.  I suspect that this might be related to his A-fib.  Will repeat a 2D echo in 3 months to assess if he is had improvement LV function as a result of DC cardioversion and restoration of sinus rhythm.

## 2024-08-16 NOTE — Progress Notes (Signed)
 "     08/16/2024 BENIGNO CHECK   1941/01/15  [id number]  Primary Physician Valentin Skates, DO Primary Cardiologist: Dorn JINNY Lesches MD [facility], [place], [place]  HPI:  NUCHEM GRATTAN is a 84 y.o.  mildly overweight married Caucasian male father of 3 children, grandfather of 7 grandchildren who is accompanied by his wife Elijah  today.  He was referred by Dr. Valentin , his PCP, because of newly recognized A-fib.  He is retired from engineer, petroleum work.  His risk factors include ongoing tobacco abuse to 4 cigarettes a day with over 50 pack years of tobacco abuse, treated hypertension, diabetes and hyperlipidemia.  His father had an MI in his 74s.  He has never had a heart attack or stroke.  He denies chest pain or shortness of breath.  He has had bladder cancer and is on Keytruda .  He has noticed claudication for the last 6 to 8 months which is lifestyle-limiting.  He was found to be in A-fib with preop EKG done prior to planned elective back surgery by Dr. Lindalee last month.  EKG a year ago showed sinus rhythm.  He is unaware that he is in A-fib.  His CV risk is 5.  Since I saw him a month ago he did have successful DC cardioversion as an outpatient by Dr. Francyne 08/13/24 to sinus rhythm which he is in today.  He says that his claudication as a result of cardioversion has improved.  He did have Doppler studies performed 07/23/2024 revealing right ABI of 0.72 and a left of 0.90 with monophasic waveforms in his common femoral arteries and SFAs as well as tibial vessels.  It certainly possible that he has more proximal disease which we will evaluate.  In addition, he did have a 2D echocardiogram performed 06/14/2024 revealing EF of 45 to 50% possibly related to A-fib.   Active Medications[1]   Allergies[2]  Social History   Socioeconomic History   Marital status: Married    Spouse name: Not on file   Number of children: 1   Years of education: Not on file   Highest education level: Not on  file  Occupational History   Not on file  Tobacco Use   Smoking status: Former    Current packs/day: 0.00    Average packs/day: 1 pack/day for 50.0 years (50.0 ttl pk-yrs)    Types: Cigarettes    Start date: 06/27/1954    Quit date: 06/27/2004    Years since quitting: 20.1   Smokeless tobacco: Never  Vaping Use   Vaping status: Never Used  Substance and Sexual Activity   Alcohol use: Yes    Alcohol/week: 0.0 standard drinks of alcohol    Comment: occ moonshine garles throat with   Drug use: No   Sexual activity: Not on file  Other Topics Concern   Not on file  Social History Narrative   Not on file   Social Drivers of Health   Tobacco Use: Medium Risk (08/09/2024)   Patient History    Smoking Tobacco Use: Former    Smokeless Tobacco Use: Never    Passive Exposure: Not on Actuary Strain: Not on file  Food Insecurity: No Food Insecurity (04/20/2024)   Epic    Worried About Programme Researcher, Broadcasting/film/video in the Last Year: Never true    Ran Out of Food in the Last Year: Never true  Transportation Needs: No Transportation Needs (04/20/2024)   Epic  Lack of Transportation (Medical): No    Lack of Transportation (Non-Medical): No  Physical Activity: Not on file  Stress: Not on file  Social Connections: Not on file  Intimate Partner Violence: Not At Risk (04/20/2024)   Epic    Fear of Current or Ex-Partner: No    Emotionally Abused: No    Physically Abused: No    Sexually Abused: No  Depression (PHQ2-9): Low Risk (07/05/2024)   Depression (PHQ2-9)    PHQ-2 Score: 0  Alcohol Screen: Not on file  Housing: Low Risk (04/20/2024)   Epic    Unable to Pay for Housing in the Last Year: No    Number of Times Moved in the Last Year: 0    Homeless in the Last Year: No  Utilities: Not At Risk (04/20/2024)   Epic    Threatened with loss of utilities: No  Health Literacy: Not on file     Review of Systems: General: negative for chills, fever, night sweats or weight changes.   Cardiovascular: negative for chest pain, dyspnea on exertion, edema, orthopnea, palpitations, paroxysmal nocturnal dyspnea or shortness of breath Dermatological: negative for rash Respiratory: negative for cough or wheezing Urologic: negative for hematuria Abdominal: negative for nausea, vomiting, diarrhea, bright red blood per rectum, melena, or hematemesis Neurologic: negative for visual changes, syncope, or dizziness All other systems reviewed and are otherwise negative except as noted above.    Blood pressure (!) 118/56, pulse 74, weight 178 lb (80.7 kg), SpO2 94%.  General appearance: alert and no distress Neck: no adenopathy, no carotid bruit, no JVD, supple, symmetrical, trachea midline, and thyroid  not enlarged, symmetric, no tenderness/mass/nodules Lungs: clear to auscultation bilaterally Heart: regular rate and rhythm, S1, S2 normal, no murmur, click, rub or gallop Extremities: extremities normal, atraumatic, no cyanosis or edema Pulses: Absent pedal pulses Skin: Skin color, texture, turgor normal. No rashes or lesions Neurologic: Grossly normal  EKG not performed today      ASSESSMENT AND PLAN:   Essential hypertension History of essential hypertension blood pressure measured today 118/56.  He is on amlodipine  and losartan  as well as propranolol .  Hyperlipidemia History of hyperlipidemia on statin therapy lipid profile performed 07/04/2023 revealing a total cholesterol of 108, LDL of 55 and HDL 27.  We will repeat a fasting lipid liver profile.  Tobacco abuse Ongoing tobacco abuse of at least 4 cigarettes a day.  Left ventricular dysfunction Left ventricular dysfunction with an ejection fraction of 45 to 50% performed 06/14/2024.  I suspect that this might be related to his A-fib.  Will repeat a 2D echo in 3 months to assess if he is had improvement LV function as a result of DC cardioversion and restoration of sinus rhythm.  Persistent atrial fibrillation  (HCC) History of persistent A-fib status post DC cardioversion by Dr. Francyne 08/13/24 excessively sinus rhythm.  He is on Eliquis .  He does not drink alcohol .  Will refer to pulmonary to workup for obstructive sleep apnea.  Claudication in peripheral vascular disease History of claudication right greater than left for at least a year with Doppler studies performed 07/23/2024 revealing a right ABI of 0.72, left of 0.90 with monophasic waveforms in his right common femoral artery, SFA and tibial vessels and biphasic waveforms in his left common femoral artery and SFA.  Will get aortoiliac Dopplers as well.  Since converting to sinus rhythm his claudication has markedly improved.     Dorn DOROTHA Lesches MD FACP,FACC,FAHA, [facility] 08/16/2024 11:54 AM     [  1]  Current Meds  Medication Sig   acetaminophen  (TYLENOL ) 650 MG CR tablet Take 1,300 mg by mouth every 8 (eight) hours as needed for pain. (Patient taking differently: Take 1,950 mg by mouth 2 (two) times daily.)   amLODipine  (NORVASC ) 10 MG tablet Take 10 mg by mouth daily.   antiseptic oral rinse (BIOTENE) LIQD 15 mLs by Mouth Rinse route as needed for dry mouth.   apixaban  (ELIQUIS ) 5 MG TABS tablet Take 1 tablet (5 mg total) by mouth 2 (two) times daily.   Artificial Saliva (BIOTENE DRY MOUTH) LOZG Use as directed 1 each in the mouth or throat daily as needed (dry mouth).   atorvastatin  (LIPITOR) 20 MG tablet Take 20 mg by mouth at bedtime.    Blood Glucose Monitoring Suppl (ACCU-CHEK AVIVA PLUS) w/Device KIT To check blood sugar vitro three times a day; Duration: 90 days   carteolol (OCUPRESS) 1 % ophthalmic solution Place 1 drop into the right eye every morning.   cyanocobalamin  (VITAMIN B12) 1000 MCG tablet Take 1,000 mcg by mouth daily.   glimepiride  (AMARYL ) 1 MG tablet He will only take if sugar is above 150 - 1 mg total   glucose blood (ACCU-CHEK AVIVA PLUS) test strip To check blood sugars In Vitro two times a day; Duration: 90  days   latanoprost (XALATAN) 0.005 % ophthalmic solution Place 1 drop into the right eye at bedtime.   levothyroxine  (SYNTHROID , LEVOTHROID) 100 MCG tablet Take 100 mcg by mouth daily before breakfast.    lidocaine -prilocaine  (EMLA ) cream Apply 1 Application topically as needed.   loratadine  (CLARITIN ) 10 MG tablet Take 10 mg by mouth daily as needed for allergies.   losartan  (COZAAR ) 100 MG tablet Take 100 mg by mouth at bedtime.    meloxicam (MOBIC) 15 MG tablet Take 15 mg by mouth daily as needed for pain.   omeprazole (PRILOSEC) 20 MG capsule Take 20 mg by mouth daily.   ondansetron  (ZOFRAN ) 8 MG tablet Take 1 tablet (8 mg total) by mouth every 8 (eight) hours as needed for nausea or vomiting. (Patient taking differently: Take 8 mg by mouth daily as needed for nausea or vomiting.)   OVER THE COUNTER MEDICATION Take 2 capsules by mouth daily. OmegaXL (perna canaliculus oil + PCSO-524)   oxymetazoline (AFRIN) 0.05 % nasal spray Place 1 spray into both nostrils 2 (two) times daily.   pembrolizumab  (KEYTRUDA ) 100 MG/4ML SOLN Inject into the vein every 21 ( twenty-one) days.   propranolol  (INDERAL ) 20 MG tablet TAKE 1 TABLET BY MOUTH TWICE A DAY   sitaGLIPtin (JANUVIA) 100 MG tablet Take 100 mg by mouth every morning.   tamsulosin  (FLOMAX ) 0.4 MG CAPS capsule Take 0.4 mg by mouth every morning.    terbinafine  (LAMISIL ) 250 MG tablet Take 1 tablet (250 mg total) by mouth daily. (Patient taking differently: Take 250 mg by mouth at bedtime.)  [2] No Known Allergies  "

## 2024-08-16 NOTE — Assessment & Plan Note (Addendum)
 History of persistent A-fib status post DC cardioversion by Dr. Francyne 08/13/24 excessively sinus rhythm.  He is on Eliquis .  He does not drink alcohol .  Will refer to pulmonary to workup for obstructive sleep apnea.

## 2024-08-16 NOTE — Assessment & Plan Note (Signed)
 History of claudication right greater than left for at least a year with Doppler studies performed 07/23/2024 revealing a right ABI of 0.72, left of 0.90 with monophasic waveforms in his right common femoral artery, SFA and tibial vessels and biphasic waveforms in his left common femoral artery and SFA.  Will get aortoiliac Dopplers as well.  Since converting to sinus rhythm his claudication has markedly improved.

## 2024-08-17 ENCOUNTER — Other Ambulatory Visit: Payer: Self-pay

## 2024-08-18 LAB — LIPID PANEL
Chol/HDL Ratio: 3.7 ratio (ref 0.0–5.0)
Cholesterol, Total: 111 mg/dL (ref 100–199)
HDL: 30 mg/dL — ABNORMAL LOW
LDL Chol Calc (NIH): 54 mg/dL (ref 0–99)
Triglycerides: 155 mg/dL — ABNORMAL HIGH (ref 0–149)
VLDL Cholesterol Cal: 27 mg/dL (ref 5–40)

## 2024-08-18 LAB — HEPATIC FUNCTION PANEL
ALT: 13 IU/L (ref 0–44)
AST: 16 IU/L (ref 0–40)
Albumin: 4.3 g/dL (ref 3.7–4.7)
Alkaline Phosphatase: 68 IU/L (ref 48–129)
Bilirubin Total: 0.3 mg/dL (ref 0.0–1.2)
Bilirubin, Direct: 0.15 mg/dL (ref 0.00–0.40)
Total Protein: 6.7 g/dL (ref 6.0–8.5)

## 2024-08-19 ENCOUNTER — Ambulatory Visit: Payer: Self-pay | Admitting: Cardiovascular Disease

## 2024-08-19 DIAGNOSIS — I739 Peripheral vascular disease, unspecified: Secondary | ICD-10-CM

## 2024-08-23 NOTE — Assessment & Plan Note (Signed)
-  Stage I, no muscular invasive, initially diagnosed in 2017  -He was found to have high-grade urothelial carcinoma without muscle invasion and developed and subsequently BCG refractory disease in December 2021. He received Intravesicular gemcitabine  with a total of 2000 mg weekly X6 started in February 2022  -bladder biopsies, bladder washings on 06/24/2022 showed focal urothelial carcinoma in situ (CIS) and cytology was positive for high grade urothelial carcinoma -I recommended intravesical gemcitabine  2000 mg weekly x 6, he started on 08/28/16.  He tolerated first cycle poorly with significant hematuria, and intermittent fever for week after treatment. -Due to his poor tolerance to intravesical gemcitabine , I changed therapy to intravesical mitomycin  40 mg every week for 5 weeks. He tolerated the treatment very well  -His surveillance cystoscopy and biopsy were negative for residual cancer -Given his good response to intravesical mitomycin , and his high risk of recurrence, Dr. Matilda recommend small intravesical therapy.  Patient agrees to proceed, started in in April 2024. Unfortunately he has had recurrent leakage from the intravesical therapy, and not able to continue. -Treatment was switched to Keytruda  on March 18, 2023, he has been tolerating well overall.  Plan to continue every 3 weeks for up to 2 years if no recurrence - His last cystoscopy was negative in October 2025.  Next due in April 2026

## 2024-08-24 ENCOUNTER — Inpatient Hospital Stay (HOSPITAL_BASED_OUTPATIENT_CLINIC_OR_DEPARTMENT_OTHER): Admitting: Hematology

## 2024-08-24 ENCOUNTER — Telehealth: Payer: Self-pay

## 2024-08-24 ENCOUNTER — Inpatient Hospital Stay

## 2024-08-24 ENCOUNTER — Inpatient Hospital Stay: Attending: Hematology

## 2024-08-24 VITALS — BP 133/58 | HR 66 | Temp 98.9°F | Resp 16 | Ht 70.0 in | Wt 177.5 lb

## 2024-08-24 VITALS — BP 126/59 | HR 64 | Resp 16

## 2024-08-24 DIAGNOSIS — C679 Malignant neoplasm of bladder, unspecified: Secondary | ICD-10-CM

## 2024-08-24 DIAGNOSIS — Z79620 Long term (current) use of immunosuppressive biologic: Secondary | ICD-10-CM | POA: Diagnosis not present

## 2024-08-24 DIAGNOSIS — C671 Malignant neoplasm of dome of bladder: Secondary | ICD-10-CM

## 2024-08-24 DIAGNOSIS — Z5112 Encounter for antineoplastic immunotherapy: Secondary | ICD-10-CM | POA: Insufficient documentation

## 2024-08-24 DIAGNOSIS — C670 Malignant neoplasm of trigone of bladder: Secondary | ICD-10-CM | POA: Diagnosis present

## 2024-08-24 LAB — CMP (CANCER CENTER ONLY)
ALT: 14 U/L (ref 0–44)
AST: 21 U/L (ref 15–41)
Albumin: 4.2 g/dL (ref 3.5–5.0)
Alkaline Phosphatase: 62 U/L (ref 38–126)
Anion gap: 11 (ref 5–15)
BUN: 11 mg/dL (ref 8–23)
CO2: 26 mmol/L (ref 22–32)
Calcium: 9.2 mg/dL (ref 8.9–10.3)
Chloride: 102 mmol/L (ref 98–111)
Creatinine: 0.88 mg/dL (ref 0.61–1.24)
GFR, Estimated: >60 mL/min
Glucose, Bld: 194 mg/dL — ABNORMAL HIGH (ref 70–99)
Potassium: 3.6 mmol/L (ref 3.5–5.1)
Sodium: 139 mmol/L (ref 135–145)
Total Bilirubin: 0.4 mg/dL (ref 0.0–1.2)
Total Protein: 7.2 g/dL (ref 6.5–8.1)

## 2024-08-24 LAB — CBC WITH DIFFERENTIAL (CANCER CENTER ONLY)
Abs Immature Granulocytes: 0.02 K/uL (ref 0.00–0.07)
Basophils Absolute: 0.1 K/uL (ref 0.0–0.1)
Basophils Relative: 1 %
Eosinophils Absolute: 0.3 K/uL (ref 0.0–0.5)
Eosinophils Relative: 5 %
HCT: 38 % — ABNORMAL LOW (ref 39.0–52.0)
Hemoglobin: 12.8 g/dL — ABNORMAL LOW (ref 13.0–17.0)
Immature Granulocytes: 0 %
Lymphocytes Relative: 19 %
Lymphs Abs: 1.1 K/uL (ref 0.7–4.0)
MCH: 29.2 pg (ref 26.0–34.0)
MCHC: 33.7 g/dL (ref 30.0–36.0)
MCV: 86.6 fL (ref 80.0–100.0)
Monocytes Absolute: 0.4 K/uL (ref 0.1–1.0)
Monocytes Relative: 7 %
Neutro Abs: 4 K/uL (ref 1.7–7.7)
Neutrophils Relative %: 68 %
Platelet Count: 158 K/uL (ref 150–400)
RBC: 4.39 MIL/uL (ref 4.22–5.81)
RDW: 12.7 % (ref 11.5–15.5)
WBC Count: 6 K/uL (ref 4.0–10.5)
nRBC: 0 % (ref 0.0–0.2)

## 2024-08-24 LAB — TSH: TSH: 1.03 u[IU]/mL (ref 0.350–4.500)

## 2024-08-24 MED ORDER — SODIUM CHLORIDE 0.9% FLUSH: 10.0000 mL | INTRAVENOUS | Status: DC | PRN

## 2024-08-24 MED ORDER — SODIUM CHLORIDE 0.9 % IV SOLN: Freq: Once | INTRAVENOUS | Status: AC

## 2024-08-24 MED ORDER — SODIUM CHLORIDE 0.9 % IV SOLN
200.0000 mg | Freq: Once | INTRAVENOUS | Status: AC
  Administered 2024-08-24: 200 mg via INTRAVENOUS
  Filled 2024-08-24: qty 8

## 2024-08-24 MED ORDER — LIDOCAINE-PRILOCAINE 2.5-2.5 % EX CREA: 1.0000 | TOPICAL_CREAM | CUTANEOUS | 2 refills | Status: AC | PRN

## 2024-08-24 NOTE — Patient Instructions (Signed)
 [facility] - A DEPT OF [facility]  Discharge Instructions: Thank you for choosing [facility] to provide your oncology and hematology care.   If you have a lab appointment with the Cancer Center, please go directly to the Cancer Center and check in at the registration area.   Wear comfortable clothing and clothing appropriate for easy access to any Portacath or PICC line.   We strive to give you quality time with your provider. You may need to reschedule your appointment if you arrive late (15 or more minutes).  Arriving late affects you and other patients whose appointments are after yours.  Also, if you miss three or more appointments without notifying the office, you may be dismissed from the clinic at the provider's discretion.      For prescription refill requests, have your pharmacy contact our office and allow 72 hours for refills to be completed.    Today you received the following chemotherapy and/or immunotherapy agents keytruda       To help prevent nausea and vomiting after your treatment, we encourage you to take your nausea medication as directed.  BELOW ARE SYMPTOMS THAT SHOULD BE REPORTED IMMEDIATELY: *FEVER GREATER THAN 100.4 F (38 C) OR HIGHER *CHILLS OR SWEATING *NAUSEA AND VOMITING THAT IS NOT CONTROLLED WITH YOUR NAUSEA MEDICATION *UNUSUAL SHORTNESS OF BREATH *UNUSUAL BRUISING OR BLEEDING *URINARY PROBLEMS (pain or burning when urinating, or frequent urination) *BOWEL PROBLEMS (unusual diarrhea, constipation, pain near the anus) TENDERNESS IN MOUTH AND THROAT WITH OR WITHOUT PRESENCE OF ULCERS (sore throat, sores in mouth, or a toothache) UNUSUAL RASH, SWELLING OR PAIN  UNUSUAL VAGINAL DISCHARGE OR ITCHING   Items with * indicate a potential emergency and should be followed up as soon as possible or go to the Emergency Department if any problems should occur.  Please show the CHEMOTHERAPY ALERT CARD or IMMUNOTHERAPY  ALERT CARD at check-in to the Emergency Department and triage nurse.  Should you have questions after your visit or need to cancel or reschedule your appointment, please contact [facility] - A DEPT OF [facility]  Dept: [phone]  and follow the prompts.  Office hours are 8:00 a.m. to 4:30 p.m. Monday - Friday. Please note that voicemails left after 4:00 p.m. may not be returned until the following business day.  We are closed weekends and major holidays. You have access to a nurse at all times for urgent questions. Please call the main number to the clinic Dept: [phone] and follow the prompts.   For any non-urgent questions, you may also contact your provider using MyChart. We now offer e-Visits for anyone 47 and older to request care online for non-urgent symptoms. For details visit [email or web address].   Also download the MyChart app! Go to the app store, search MyChart, open the app, select [place], and log in with your MyChart username and password.

## 2024-08-24 NOTE — Telephone Encounter (Signed)
 Oral Oncology Patient Advocate Encounter   Received notification that prior authorization for Lido/Prilo Cream is required.   PA submitted on 08/24/24 Key [id number] Status is pending      [name],  CPhT-Adv  she/her/hers [facility]  [facility] Pharmacy Technician Patient Advocate Specialist III [facility] Phone: [phone]  Fax: [phone] [name]

## 2024-08-24 NOTE — Progress Notes (Unsigned)
 " [name]   [phone]) [phone] Fax:([phone]   Clinic Follow up Note   Patient Care Team: Keith Skates, DO as PCP - General (Internal Medicine) Keith Dorn PARAS, MD as PCP - Cardiology (Cardiology) Keith Callander, MD as Attending Physician (Hematology and Oncology) Keith Senior, MD as Consulting Physician (Urology)  Date of Service:  08/24/2024  CHIEF COMPLAINT: f/u of bladder cancer   CURRENT THERAPY:  Keytruda  every 3 weeks   Oncology History   Bladder cancer (HCC) -Stage I, no muscular invasive, initially diagnosed in 2017  -He was found to have high-grade urothelial carcinoma without muscle invasion and developed and subsequently BCG refractory disease in December 2021. He received Intravesicular gemcitabine  with a total of 2000 mg weekly X6 started in February 2022  -bladder biopsies, bladder washings on 06/24/2022 showed focal urothelial carcinoma in situ (CIS) and cytology was positive for high grade urothelial carcinoma -I recommended intravesical gemcitabine  2000 mg weekly x 6, he started on 08/28/16.  He tolerated first cycle poorly with significant hematuria, and intermittent fever for week after treatment. -Due to his poor tolerance to intravesical gemcitabine , I changed therapy to intravesical mitomycin  40 mg every week for 5 weeks. He tolerated the treatment very well  -His surveillance cystoscopy and biopsy were negative for residual cancer -Given his good response to intravesical mitomycin , and his high risk of recurrence, Dr. Matilda recommend small intravesical therapy.  Patient agrees to proceed, started in in April 2024. Unfortunately he has had recurrent leakage from the intravesical therapy, and not able to continue. -Treatment was switched to Keytruda  on March 18, 2023, he has been tolerating well overall.  Plan to continue every 3 weeks for up to 2 years if no recurrence - His last cystoscopy was negative in October 2025.  Next due  in April 2026  Assessment & Plan Bladder cancer He remains on pembrolizumab  (Keytruda ) immunotherapy, now over one year into a planned two-year course. He reports no hematuria or bleeding while on concurrent apixaban  (Eliquis ). Energy and functional status have improved following recent cardiac interventions. If upcoming cystoscopies remain negative, immunotherapy will be discontinued in August and he will transition to surveillance. - Continued pembrolizumab  infusions as scheduled. - Confirmed next infusion for February 3rd at 8:30 AM. - Discussed option to request later infusion times for future appointments. - Plan to discontinue pembrolizumab  in August if next one or two cystoscopies are negative and transition to surveillance. - Provided medication refill as requested. - Advised to monitor for hematuria or other bleeding due to concurrent apixaban  use.     SUMMARY OF ONCOLOGIC HISTORY: Oncology History Overview Note   Cancer Staging  Bladder cancer [facility]) Staging form: Urinary Bladder, AJCC 8th Edition - Clinical: Stage I (cT1, cN0, cM0) - Signed by Keith Windell SAILOR, MD on 09/12/2020 WHO/ISUP grade (low/high): High Grade Histologic grading system: 2 grade system - Pathologic stage from 06/24/2022: Stage 0is (pTis, pN0, cM0) - Signed by Keith Callander, MD on 08/24/2022 Stage prefix: Initial diagnosis WHO/ISUP grade (low/high): High Grade Histologic grading system: 2 grade system     Bladder cancer (HCC)  09/12/2020 Initial Diagnosis   Bladder cancer (HCC)   09/12/2020 Cancer Staging   Staging form: Urinary Bladder, AJCC 8th Edition - Clinical: Stage I (cT1, cN0, cM0) - Signed by Keith Windell SAILOR, MD on 09/12/2020 WHO/ISUP grade (low/high): High Grade Histologic grading system: 2 grade system   06/24/2022 Pathology Results  FINAL MICROSCOPIC DIAGNOSIS:  A. BLADDER, BIOPSY: Focal urothelial carcinoma in situ (CIS)     06/24/2022 Cancer Staging   Staging form:  Urinary Bladder, AJCC 8th Edition - Pathologic stage from 06/24/2022: Stage 0is (pTis, pN0, cM0) - Signed by Keith Callander, MD on 08/24/2022 Stage prefix: Initial diagnosis WHO/ISUP grade (low/high): High Grade Histologic grading system: 2 grade system   08/28/2022 - 08/28/2022 Chemotherapy   Patient is on Treatment Plan : BLADDER Gemcitabine  INTRAVESICAL (2000) q7d     09/12/2022 - 02/26/2023 Chemotherapy   Patient is on Treatment Plan : BLADDER Mitomycin  INTRAVESICAL (40 mg) q7d     10/25/2022 Genetic Testing   Negative [facility] CancerNext-Expanded +RNA Panel.  Report date is 10/25/2022.   The CancerNext-Expanded gene panel offered by [facility] and includes sequencing, rearrangement, and RNA analysis for the following 77 genes: AIP, ALK, APC, ATM, AXIN2, BAP1, BARD1, BLM, BMPR1A, BRCA1, BRCA2, BRIP1, CDC73, CDH1, CDK4, CDKN1B, CDKN2A, CHEK2, CTNNA1, DICER1, FANCC, FH, FLCN, GALNT12, KIF1B, LZTR1, MAX, MEN1, MET, MLH1, MSH2, MSH3, MSH6, MUTYH, NBN, NF1, NF2, NTHL1, PALB2, PHOX2B, PMS2, POT1, PRKAR1A, PTCH1, PTEN, RAD51C, RAD51D, RB1, RECQL, RET, SDHA, SDHAF2, SDHB, SDHC, SDHD, SMAD4, SMARCA4, SMARCB1, SMARCE1, STK11, SUFU, TMEM127, TP53, TSC1, TSC2, VHL and XRCC2 (sequencing and deletion/duplication); EGFR, EGLN1, HOXB13, KIT, MITF, PDGFRA, POLD1, and POLE (sequencing only); EPCAM and GREM1 (deletion/duplication only).    03/18/2023 -  Chemotherapy   Patient is on Treatment Plan : BLADDER Pembrolizumab  (200) q21d     Malignant neoplasm of trigone of urinary bladder (HCC)  09/12/2020 Initial Diagnosis   Malignant neoplasm of trigone of urinary bladder (HCC)   09/26/2020 - 10/31/2020 Chemotherapy   Patient is on Treatment Plan : BLADDER Gemcitabine  q7d     04/06/2024 Imaging   CT CAP with contrast  IMPRESSION: 1. No acute CT findings of the chest, abdomen, or pelvis to explain fever. 2. No evidence of bladder mass. No evidence of lymphadenopathy or metastatic disease in the chest, abdomen, or  pelvis. 3. Prostatomegaly. 4. Punctuate nonobstructive calculus of the superior pole of the left kidney. No right-sided calculi, ureteral calculi, or hydronephrosis. 5. Emphysema and diffuse bilateral bronchial wall thickening. 6. Coronary artery disease.   04/08/2024 Imaging   CT CAP with contrast IMPRESSION: 1. No acute CT findings of the chest, abdomen, or pelvis to explain fever. 2. No evidence of bladder mass. No evidence of lymphadenopathy or metastatic disease in the chest, abdomen, or pelvis. 3. Prostatomegaly. 4. Punctuate nonobstructive calculus of the superior pole of the left kidney. No right-sided calculi, ureteral calculi, or hydronephrosis. 5. Emphysema and diffuse bilateral bronchial wall thickening. 6. Coronary artery disease.      Discussed the use of AI scribe software for clinical note transcription with the patient, who gave verbal consent to proceed.  History of Present Illness Keith [name] is an 84 year old male with bladder cancer on pembrolizumab  who presents for routine hematology-oncology follow-up during ongoing immunotherapy.  He has received pembrolizumab  since August 2024 with a planned total duration of two years and next infusion in April. He is tolerating treatment without new or worsening cancer-related symptoms. He denies hematuria, other bleeding, joint pain, rash, or unusual bruising. He has persistent dry, scaly skin that he manages with topical emollients.  Since the last visit he was hospitalized in November 2025 for new-onset atrial fibrillation discovered during preoperative evaluation for back surgery, which was postponed. He underwent cardioversion last week and remains on apixaban  without bleeding issues. Vascular studies showed significant bilateral lower  extremity arterial stenosis (right 90%, left 70%), and he is awaiting further evaluation for additional blockages. He reports improved leg symptoms and energy with less frequent leg  weakness and better activity tolerance.  He reports frustration and depressive symptoms related to reduced physical capacity and difficulty performing physically demanding activities such as working in his workshop and restoring a car, though he notes some improvement in activity tolerance.     All other systems were reviewed with the patient and are negative.  MEDICAL HISTORY:  Past Medical History:  Diagnosis Date   Bladder cancer (HCC)    Blind right eye    BPH (benign prostatic hypertrophy)    Cataracts, both eyes    Depression    DM type 2 (diabetes mellitus, type 2) (HCC)    DOE (dyspnea on exertion)    with heavy exertion   Full dentures    FUO (fever of unknown origin) 04/20/2024   GERD (gastroesophageal reflux disease)    Glaucoma no peripheral or side vision   right eye blind   Glaucoma, right eye    HOH (hard of hearing)    Hyperlipidemia    Hypertension    Hypothyroidism    Local recurrence of cancer of urinary bladder [facility]) urologsit-  dr Keith   1st dx 01-29-2016--  now recurrent 05-31-2016   Lower extremity edema 07/11/2020   right swells more than left goes down at night after sleeping   Lower urinary tract symptoms (LUTS) 2019   Lumbar stenosis 05/24/2024   Migraine    Peripheral neuropathy    middle finger left hand   Pneumonia 2019   Skin abnormality    base of spine looks like pimple has had since 07-01-2020, no drainage area intact   Wears glasses     SURGICAL HISTORY: Past Surgical History:  Procedure Laterality Date   ANTERIOR CERVICAL DECOMP/DISCECTOMY FUSION  08-15-2000   C5 -- C6   ANTERIOR CERVICAL DECOMP/DISCECTOMY FUSION  03-04-2008   C6 -- C7   areas removed from back at spine  2021 jan and aug 2021   CARDIOVERSION N/A 08/13/2024   Procedure: CARDIOVERSION;  Surgeon: Francyne Headland, MD;  Location: [facility];  Service: Cardiovascular;  Laterality: N/A;   CYSTOSCOPY W/ RETROGRADES Left 05/11/2020   Procedure: Cystoscopy  with left retrograde ureteropyelogram, resection of probable bladder tumor and left trigonal/ureteral orifice region, 3 cm in size, fluoroscopic interpretation, left ureteroscopy, placement of 6 French by 26 cm contour double-J stent without tether;  Surgeon: Keith Senior, MD;  Location: [facility];  Service: Urology;  Laterality: Left;   CYSTOSCOPY W/ RETROGRADES Bilateral 06/24/2022   Procedure: CYSTOSCOPY WITH RETROGRADE PYELOGRAM;  Surgeon: Keith Senior, MD;  Location: [facility];  Service: Urology;  Laterality: Bilateral;  1 HR   CYSTOSCOPY WITH BIOPSY N/A 01/29/2016   Procedure: CYSTOSCOPY WITH BIOPSY;  Surgeon: [name] Matilda, MD;  Location: [facility];  Service: Urology;  Laterality: N/A;   CYSTOSCOPY WITH BIOPSY N/A 05/31/2016   Procedure: CYSTOSCOPY WITH BIOPSY;  Surgeon: [name] Matilda, MD;  Location: [facility];  Service: Urology;  Laterality: N/A;   CYSTOSCOPY WITH BIOPSY N/A 06/24/2022   Procedure: CYSTOSCOPY WITH BIOPSY;  Surgeon: Keith Senior, MD;  Location: [facility];  Service: Urology;  Laterality: N/A;   EYE SURGERY Right 1998 & 2000   [place]   GLAUCOMA SURGERY  1998 and 2000   IR IMAGING GUIDED PORT INSERTION  03/31/2023   lower back  06/01/2019, 02-10-2020  nose cancer  areas removed  2019, 2021   x 2 / 3/ 2021 small area removed from nose   surgery for sacral fracture  40 yrs ago   TRANSURETHRAL RESECTION OF BLADDER TUMOR N/A 07/17/2020   Procedure: TRANSURETHRAL RESECTION OF BLADDER TUMOR (TURBT);  Surgeon: Keith Senior, MD;  Location[facility];  Service: Urology;  Laterality: N/A;    I have reviewed the social history and family history with the patient and they are unchanged from previous note.  ALLERGIES:  has no known allergies.  MEDICATIONS:  Current Outpatient Medications  Medication Sig Dispense Refill   acetaminophen  (TYLENOL ) 650 MG CR tablet Take 1,300 mg by mouth every  8 (eight) hours as needed for pain. (Patient taking differently: Take 1,950 mg by mouth 2 (two) times daily.)     amLODipine  (NORVASC ) 10 MG tablet Take 10 mg by mouth daily.     antiseptic oral rinse (BIOTENE) LIQD 15 mLs by Mouth Rinse route as needed for dry mouth.     apixaban  (ELIQUIS ) 5 MG TABS tablet Take 1 tablet (5 mg total) by mouth 2 (two) times daily. 60 tablet 3   Artificial Saliva (BIOTENE DRY MOUTH) LOZG Use as directed 1 each in the mouth or throat daily as needed (dry mouth).     atorvastatin  (LIPITOR) 20 MG tablet Take 20 mg by mouth at bedtime.      Blood Glucose Monitoring Suppl (ACCU-CHEK AVIVA PLUS) w/Device KIT To check blood sugar vitro three times a day; Duration: 90 days     carteolol (OCUPRESS) 1 % ophthalmic solution Place 1 drop into the right eye every morning.     cyanocobalamin  (VITAMIN B12) 1000 MCG tablet Take 1,000 mcg by mouth daily.     glimepiride  (AMARYL ) 1 MG tablet He will only take if sugar is above 150 - 1 mg total     glucose blood (ACCU-CHEK AVIVA PLUS) test strip To check blood sugars In Vitro two times a day; Duration: 90 days     latanoprost (XALATAN) 0.005 % ophthalmic solution Place 1 drop into the right eye at bedtime.     levothyroxine  (SYNTHROID , LEVOTHROID) 100 MCG tablet Take 100 mcg by mouth daily before breakfast.      lidocaine -prilocaine  (EMLA ) cream Apply 1 Application topically as needed. 30 g 2   loratadine  (CLARITIN ) 10 MG tablet Take 10 mg by mouth daily as needed for allergies.     losartan  (COZAAR ) 100 MG tablet Take 100 mg by mouth at bedtime.      meloxicam (MOBIC) 15 MG tablet Take 15 mg by mouth daily as needed for pain.     omeprazole (PRILOSEC) 20 MG capsule Take 20 mg by mouth daily.     ondansetron  (ZOFRAN ) 8 MG tablet Take 1 tablet (8 mg total) by mouth every 8 (eight) hours as needed for nausea or vomiting. (Patient taking differently: Take 8 mg by mouth daily as needed for nausea or vomiting.) 30 tablet 1   OVER THE  COUNTER MEDICATION Take 2 capsules by mouth daily. OmegaXL (perna canaliculus oil + PCSO-524)     oxymetazoline (AFRIN) 0.05 % nasal spray Place 1 spray into both nostrils 2 (two) times daily.     pembrolizumab  (KEYTRUDA ) 100 MG/4ML SOLN Inject into the vein every 21 ( twenty-one) days.     propranolol  (INDERAL ) 20 MG tablet TAKE 1 TABLET BY MOUTH TWICE A DAY 180 tablet 1   sitaGLIPtin (JANUVIA) 100 MG tablet Take 100 mg by  mouth every morning.     tamsulosin  (FLOMAX ) 0.4 MG CAPS capsule Take 0.4 mg by mouth every morning.      terbinafine  (LAMISIL ) 250 MG tablet Take 1 tablet (250 mg total) by mouth daily. (Patient taking differently: Take 250 mg by mouth at bedtime.) 30 tablet 3   No current facility-administered medications for this visit.    PHYSICAL EXAMINATION: ECOG PERFORMANCE STATUS: 2 - Symptomatic, <50% confined to bed  Vitals:   08/24/24 0900  BP: (!) 133/58  Pulse: 66  Resp: 16  Temp: 98.9 F (37.2 C)  SpO2: 100%   Wt Readings from Last 3 Encounters:  08/24/24 177 lb 8 oz (80.5 kg)  08/16/24 178 lb (80.7 kg)  08/09/24 181 lb 6.4 oz (82.3 kg)     GENERAL:alert, no distress and comfortable SKIN: skin color, texture, turgor are normal, no rashes or significant lesions EYES: normal, Conjunctiva are pink and non-injected, sclera clear Musculoskeletal:no cyanosis of digits and no clubbing  NEURO: alert & oriented x 3 with fluent speech, no focal motor/sensory deficits  Physical Exam    LABORATORY DATA:  I have reviewed the data as listed    Latest Ref Rng & Units 08/24/2024    9:01 AM 07/27/2024    8:02 AM 07/05/2024    2:54 PM  CBC  WBC 4.0 - 10.5 K/uL 6.0  5.9  5.9   Hemoglobin 13.0 - 17.0 g/dL 87.1  87.4  87.9   Hematocrit 39.0 - 52.0 % 38.0  36.9  35.4   Platelets 150 - 400 K/uL 158  174  158         Latest Ref Rng & Units 08/24/2024    9:01 AM 08/18/2024    8:22 AM 07/27/2024    8:02 AM  CMP  Glucose 70 - 99 mg/dL 805   751   BUN 8 - 23 mg/dL 11    17   Creatinine 9.38 - 1.24 mg/dL 9.11   8.91   Sodium 864 - 145 mmol/L 139   138   Potassium 3.5 - 5.1 mmol/L 3.6   3.8   Chloride 98 - 111 mmol/L 102   103   CO2 22 - 32 mmol/L 26   25   Calcium  8.9 - 10.3 mg/dL 9.2   9.3   Total Protein 6.5 - 8.1 g/dL 7.2  6.7  7.2   Total Bilirubin 0.0 - 1.2 mg/dL 0.4  0.3  0.4   Alkaline Phos 38 - 126 U/L 62  68  57   AST 15 - 41 U/L 21  16  18    ALT 0 - 44 U/L 14  13  12        RADIOGRAPHIC STUDIES: I have personally reviewed the radiological images as listed and agreed with the findings in the report. No results found.    Orders Placed This Encounter  Procedures   CBC with Differential (Cancer Center Only)    Standing Status:   Future    Expected Date:   10/05/2024    Expiration Date:   10/05/2025   CMP (Cancer Center only)    Standing Status:   Future    Expected Date:   10/05/2024    Expiration Date:   10/05/2025   T4    Standing Status:   Future    Expected Date:   10/05/2024    Expiration Date:   10/05/2025   TSH    Standing Status:   Future    Expected Date:  10/05/2024    Expiration Date:   10/05/2025   CBC with Differential (Cancer Center Only)    Standing Status:   Future    Expected Date:   10/26/2024    Expiration Date:   10/26/2025   CMP (Cancer Center only)    Standing Status:   Future    Expected Date:   10/26/2024    Expiration Date:   10/26/2025   T4    Standing Status:   Future    Expected Date:   10/26/2024    Expiration Date:   10/26/2025   TSH    Standing Status:   Future    Expected Date:   10/26/2024    Expiration Date:   10/26/2025   CBC with Differential (Cancer Center Only)    Standing Status:   Future    Expected Date:   11/16/2024    Expiration Date:   11/16/2025   CMP (Cancer Center only)    Standing Status:   Future    Expected Date:   11/16/2024    Expiration Date:   11/16/2025   T4    Standing Status:   Future    Expected Date:   11/16/2024    Expiration Date:   11/16/2025   TSH    Standing Status:   Future     Expected Date:   11/16/2024    Expiration Date:   11/16/2025   All questions were answered. The patient knows to call the clinic with any problems, questions or concerns. No barriers to learning was detected. The total time spent in the appointment was 25 minutes, including review of chart and various tests results, discussions about plan of care and coordination of care plan     Onita Mattock, MD 08/24/2024     "

## 2024-08-25 ENCOUNTER — Encounter: Payer: Self-pay | Admitting: Hematology

## 2024-08-25 LAB — T4: T4, Total: 9.6 ug/dL (ref 4.5–12.0)

## 2024-08-25 NOTE — Telephone Encounter (Signed)
 Oral Oncology Patient Advocate Encounter  Prior Authorization for Lido/Prilo Cream has been approved.    Request ID: [id number] Effective dates: 01/13/206 through 11/22/2024   Patient has been notified via MyChart  [name],  CPhT-Adv  she/her/hers [facility]  [facility] Pharmacy Technician Patient Advocate Specialist III [facility] Phone: [phone]  Fax: [phone] [email or web address]

## 2024-08-31 ENCOUNTER — Ambulatory Visit (HOSPITAL_COMMUNITY)

## 2024-09-01 ENCOUNTER — Ambulatory Visit (HOSPITAL_COMMUNITY)
Admission: RE | Admit: 2024-09-01 | Discharge: 2024-09-01 | Disposition: A | Source: Ambulatory Visit | Attending: Cardiovascular Disease | Admitting: Cardiovascular Disease

## 2024-09-01 ENCOUNTER — Ambulatory Visit (HOSPITAL_COMMUNITY)
Admission: RE | Admit: 2024-09-01 | Discharge: 2024-09-01 | Disposition: A | Discharge status: Home / Self Care | Source: Ambulatory Visit | Attending: Cardiovascular Disease | Admitting: Cardiovascular Disease

## 2024-09-01 DIAGNOSIS — I739 Peripheral vascular disease, unspecified: Secondary | ICD-10-CM

## 2024-09-01 LAB — VAS US ABI WITH/WO TBI
Left ABI: 0.54
Right ABI: 0.6

## 2024-09-12 NOTE — Progress Notes (Unsigned)
 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 He received Intravesicular gemcitabine  with a total of 2000 mg weekly X6 started in February 2022  -bladder biopsies, bladder washings on 06/24/2022 showed focal urothelial carcinoma in situ (CIS) and cytology was positive for high grade urothelial carcinoma -I recommended intravesical gemcitabine  2000 mg weekly x 6, he started on 08/28/16.  He tolerated first cycle poorly with significant hematuria, and intermittent fever for week after treatment. -Due to his poor tolerance to intravesical gemcitabine , I changed therapy to intravesical mitomycin  40 mg every week for 5 weeks. He tolerated the treatment very well  -His surveillance cystoscopy and biopsy were negative for residual cancer -Given his good response to intravesical mitomycin , and his high risk of recurrence, Dr. Matilda recommend small intravesical therapy.  Patient agreed to proceed, started in in April 2024. Unfortunately he has had recurrent leakage from the intravesical therapy, and not able to continue. -Treatment was switched to Keytruda  on March 18, 2023, he tolerated first cycle well.  Plan to continue every 3 weeks for up to 2 years if no recurrence. -He is overall tolerating treatment well, with mild fatigue. He does have some dry skin, he states all over. He does have some mild, atopic dermatitis noted on the cheek bone areas. He  has just started to use Jergen's lotion for diabetics which seems to help the dryness. no significant toxicity, will continue treatment. 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 He received Intravesicular gemcitabine  with a total of 2000 mg weekly X6 started in February 2022  -bladder biopsies, bladder washings on 06/24/2022 showed focal urothelial carcinoma in situ (CIS) and cytology was positive for high grade urothelial carcinoma -intravesical gemcitabine  2000 mg weekly x 6 was recommended. He started on 08/28/16.  He tolerated first cycle poorly with significant hematuria, and intermittent fever for week after treatment. -Due to his poor tolerance to intravesical gemcitabine , his therapy was changed to intravesical mitomycin  40 mg every week for 5 weeks. He tolerated the treatment very well  -His surveillance cystoscopy and biopsy were negative for residual cancer -Given his good response to intravesical mitomycin , and his high risk of recurrence, Dr. Matilda recommend small intravesical therapy.  Patient agreed to proceed, started in in April 2024. Unfortunately he has had recurrent leakage from the intravesical therapy, and not able to continue. -Treatment was switched to Keytruda  on March 18, 2023, he tolerated first cycle well.  Plan to continue every 3 weeks for up to 2 years if no recurrence. -He is overall tolerating treatment well, with mild fatigue. He does have some dry skin, he states all over. He does have some mild, atopic dermatitis noted on the cheek bone areas. He has just started to use Jergen's lotion for diabetics which seems to help the dryness. no significant toxicity, will continue treatment. -08/12/2023 - today, he  presents for Cycle #8 day 1 . - Restaging CT CAP done 04/08/2024 -no acute  abnormality and no definitive evidence of recurrence or metastatic disease. - 04/20/2024 -proceed with cycle 20 of IV Keytruda  200 mg. -- 05/14/2024 -patient continues to tolerate treatment well.  He is now following with infectious disease due to recent history of fever of unknown origin.  Proceed with treatment today and continue with IV Keytruda  every 3 weeks. - His last cystoscopy was negative in October 2025.  Next due in April 2026 - Continues to tolerate Keytruda  with minimal negative side effects.  Plan to discontinue if next 1 to 2 cystoscopies continue to be negative.        Pruritis The patient reports dry and itchy skin since he started treatment with Keytruda . Overall, this is not interfering with his regular activities. He uses CeraVe cream on his feet, but does not like how it feels on the skin if his leg and arms. Recommended use of Eucerin Calming Cream as needed. May also try Udder Butter on bothersome areas. Both suggestions are over the counter.   Bladder Cancer Began treatment with Keytruda  in August 2024. He is tolerating well overall.  - Most recent cystoscopy from 05/2024 was negative. He is scheduled for another 11/2024. - Plan to discontinue pembrolizumab  in August 2026 if next one or two cystoscopies are negative and transition to surveillance. - Advised to monitor for hematuria or other bleeding due to concurrent treatment with Eliquis    Plan Labs reviewed. - Mild and stable anemia. - CMP unremarkable. - Thyroid  panel is pending. Tolerate maintenance Keytruda  well with minimal negative side effects. Labs and patient presentation are appropriate for treatment today. - Proceed with Keytruda  intravenously. Plan for labs, OV, and Keytruda  in 3 weeks.  The patient understands the plans discussed today and is in agreement with them.  He knows to contact our office if he develops concerns prior to his next appointment.  I provided 20 minutes of face-to-face time during  this encounter and > 50% was spent counseling as documented under my assessment and plan.    Powell FORBES Lessen, NP  [facility] - A DEPT OF [facility] [address] [place] [address] Dept: [phone] Dept Fax: [phone]   No orders of the defined types were placed in this encounter.     CHIEF COMPLAINT:  CC: Bladder cancer  Current Treatment: Keytruda  every 3 weeks  INTERVAL HISTORY:  Brok is here today for repeat clinical assessment. He last saw dr. Lanny 08/24/2024.  He continues to do well with minimal negative side effects.  He reports dry and itchy skin.  He reports flaking as well.  He does use CeraVe lotion on his feet, but does not like how it feels on other parts of his body.  He denies mucositis.  He has not noted unusual bleeding such as blood in the stool, hematuria, or hematemesis.  He denies chest pain, chest pressure, or shortness of breath. He denies headaches or visual disturbances. He denies abdominal pain, nausea, vomiting, or changes in bowel or bladder habits.  He denies fevers or chills. He denies pain. His appetite is good. His weight has decreased 2 pounds over last 3 weeks.  I have reviewed the past medical history, past surgical history, social history and family history with the patient and they are unchanged from previous note.  ALLERGIES:  has no known allergies.  MEDICATIONS:  Current Outpatient Medications  Medication Sig Dispense Refill   acetaminophen  (TYLENOL ) 650 MG CR tablet Take 1,300 mg by mouth every 8 (eight) hours as needed for pain. (Patient taking differently: Take 1,950 mg by mouth 2 (two) times daily.)     amLODipine  (NORVASC ) 10 MG tablet Take 10 mg by mouth daily.     antiseptic oral rinse (BIOTENE) LIQD 15 mLs by Mouth Rinse route as needed for dry mouth.     apixaban  (ELIQUIS ) 5 MG TABS tablet Take 1 tablet (5 mg total) by mouth 2 (two) times daily. 60 tablet 3    Artificial Saliva (BIOTENE DRY MOUTH) LOZG Use as directed 1 each in the mouth or throat daily as needed (dry mouth).     atorvastatin  (LIPITOR) 20 MG tablet Take 20 mg by mouth at bedtime.      Blood Glucose Monitoring Suppl (ACCU-CHEK AVIVA PLUS) w/Device KIT To check blood sugar vitro three times a day; Duration: 90 days     carteolol (OCUPRESS) 1 % ophthalmic solution Place 1 drop into the right eye every morning.     cyanocobalamin  (VITAMIN B12) 1000 MCG tablet Take 1,000 mcg by mouth daily.     glimepiride  (AMARYL ) 1 MG tablet He will only take if sugar is above 150 - 1 mg total     glucose blood (ACCU-CHEK AVIVA PLUS) test strip To check blood sugars In Vitro two times a day; Duration: 90 days     latanoprost (XALATAN) 0.005 % ophthalmic solution Place 1 drop into the right eye at bedtime.     levothyroxine  (SYNTHROID , LEVOTHROID) 100 MCG tablet Take 100 mcg by mouth daily before breakfast.      lidocaine -prilocaine  (EMLA ) cream Apply 1 Application topically as needed. 30 g 2   loratadine  (CLARITIN ) 10 MG tablet Take 10 mg by mouth daily as needed for allergies.     losartan  (COZAAR ) 100 MG tablet Take 100 mg by mouth at bedtime.      meloxicam (MOBIC) 15 MG tablet Take 15 mg by mouth daily as needed for pain.     omeprazole (PRILOSEC) 20 MG capsule Take 20 mg by mouth daily.     ondansetron  (ZOFRAN ) 8 MG tablet Take 1 tablet (8 mg total) by mouth every 8 (eight) hours as needed for nausea or vomiting. (Patient taking differently: Take 8 mg by mouth daily as needed for nausea or vomiting.) 30 tablet 1   OVER THE COUNTER MEDICATION Take 2 capsules by mouth daily. OmegaXL (perna canaliculus oil + PCSO-524)     oxymetazoline (AFRIN) 0.05 % nasal spray Place 1 spray into both nostrils 2 (two) times daily.     pembrolizumab  (KEYTRUDA ) 100 MG/4ML SOLN Inject into the vein every 21 ( twenty-one) days.     propranolol  (INDERAL ) 20 MG tablet TAKE 1 TABLET BY MOUTH TWICE A DAY 180 tablet 1    sitaGLIPtin (JANUVIA) 100 MG tablet Take 100 mg by mouth every morning.     tamsulosin  (FLOMAX ) 0.4 MG CAPS capsule Take 0.4 mg by mouth every morning.      terbinafine  (LAMISIL ) 250 MG tablet Take 1 tablet (250 mg total) by mouth daily. (Patient taking differently: Take 250 mg by mouth at bedtime.) 30 tablet 3   No current facility-administered medications for this visit.    HISTORY OF PRESENT ILLNESS:   Oncology History Overview Note   Cancer Staging  Bladder cancer [facility]) Staging form: Urinary Bladder, AJCC 8th Edition - Clinical: Stage I (cT1, cN0, cM0) -  Signed by Amadeo Windell SAILOR, MD on 09/12/2020 WHO/ISUP grade (low/high): High Grade Histologic grading system: 2 grade system - Pathologic stage from 06/24/2022: Stage 0is (pTis, pN0, cM0) - Signed by Lanny Callander, MD on 08/24/2022 Stage prefix: Initial diagnosis WHO/ISUP grade (low/high): High Grade Histologic grading system: 2 grade system     Bladder cancer (HCC)  09/12/2020 Initial Diagnosis   Bladder cancer (HCC)   09/12/2020 Cancer Staging   Staging form: Urinary Bladder, AJCC 8th Edition - Clinical: Stage I (cT1, cN0, cM0) - Signed by Amadeo Windell SAILOR, MD on 09/12/2020 WHO/ISUP grade (low/high): High Grade Histologic grading system: 2 grade system   06/24/2022 Pathology Results             FINAL MICROSCOPIC DIAGNOSIS:  A. BLADDER, BIOPSY: Focal urothelial carcinoma in situ (CIS)     06/24/2022 Cancer Staging   Staging form: Urinary Bladder, AJCC 8th Edition - Pathologic stage from 06/24/2022: Stage 0is (pTis, pN0, cM0) - Signed by Lanny Callander, MD on 08/24/2022 Stage prefix: Initial diagnosis WHO/ISUP grade (low/high): High Grade Histologic grading system: 2 grade system   08/28/2022 - 08/28/2022 Chemotherapy   Patient is on Treatment Plan : BLADDER Gemcitabine  INTRAVESICAL (2000) q7d     09/12/2022 - 02/26/2023 Chemotherapy   Patient is on Treatment Plan : BLADDER Mitomycin  INTRAVESICAL (40 mg) q7d     10/25/2022  Genetic Testing   Negative [facility] CancerNext-Expanded +RNA Panel.  Report date is 10/25/2022.   The CancerNext-Expanded gene panel offered by [facility] and includes sequencing, rearrangement, and RNA analysis for the following 77 genes: AIP, ALK, APC, ATM, AXIN2, BAP1, BARD1, BLM, BMPR1A, BRCA1, BRCA2, BRIP1, CDC73, CDH1, CDK4, CDKN1B, CDKN2A, CHEK2, CTNNA1, DICER1, FANCC, FH, FLCN, GALNT12, KIF1B, LZTR1, MAX, MEN1, MET, MLH1, MSH2, MSH3, MSH6, MUTYH, NBN, NF1, NF2, NTHL1, PALB2, PHOX2B, PMS2, POT1, PRKAR1A, PTCH1, PTEN, RAD51C, RAD51D, RB1, RECQL, RET, SDHA, SDHAF2, SDHB, SDHC, SDHD, SMAD4, SMARCA4, SMARCB1, SMARCE1, STK11, SUFU, TMEM127, TP53, TSC1, TSC2, VHL and XRCC2 (sequencing and deletion/duplication); EGFR, EGLN1, HOXB13, KIT, MITF, PDGFRA, POLD1, and POLE (sequencing only); EPCAM and GREM1 (deletion/duplication only).    03/18/2023 -  Chemotherapy   Patient is on Treatment Plan : BLADDER Pembrolizumab  (200) q21d     Malignant neoplasm of trigone of urinary bladder (HCC)  09/12/2020 Initial Diagnosis   Malignant neoplasm of trigone of urinary bladder (HCC)   09/26/2020 - 10/31/2020 Chemotherapy   Patient is on Treatment Plan : BLADDER Gemcitabine  q7d     04/06/2024 Imaging   CT CAP with contrast  IMPRESSION: 1. No acute CT findings of the chest, abdomen, or pelvis to explain fever. 2. No evidence of bladder mass. No evidence of lymphadenopathy or metastatic disease in the chest, abdomen, or pelvis. 3. Prostatomegaly. 4. Punctuate nonobstructive calculus of the superior pole of the left kidney. No right-sided calculi, ureteral calculi, or hydronephrosis. 5. Emphysema and diffuse bilateral bronchial wall thickening. 6. Coronary artery disease.   04/08/2024 Imaging   CT CAP with contrast IMPRESSION: 1. No acute CT findings of the chest, abdomen, or pelvis to explain fever. 2. No evidence of bladder mass. No evidence of lymphadenopathy or metastatic disease in the chest, abdomen, or  pelvis. 3. Prostatomegaly. 4. Punctuate nonobstructive calculus of the superior pole of the left kidney. No right-sided calculi, ureteral calculi, or hydronephrosis. 5. Emphysema and diffuse bilateral bronchial wall thickening. 6. Coronary artery disease.       REVIEW OF SYSTEMS:   Constitutional: Denies fevers, chills or abnormal weight loss Eyes: Denies blurriness  of vision Ears, nose, mouth, throat, and face: Denies mucositis or sore throat Respiratory: Denies cough, dyspnea or wheezes Cardiovascular: Denies palpitation, chest discomfort or lower extremity swelling Gastrointestinal:  Denies nausea, heartburn or change in bowel habits Skin: Denies abnormal skin rashes.  Does have itchy, dry skin.  Has noticed flaking due to the dryness. Lymphatics: Denies new lymphadenopathy or easy bruising Neurological:Denies numbness, tingling or new weaknesses Behavioral/Psych: Mood is stable, no new changes  All other systems were reviewed with the patient and are negative.   VITALS:   Today's Vitals   09/14/24 0933 09/14/24 0934  BP: (!) 134/57 (!) 126/57  Pulse: 66   Resp: 17   Temp: 98.1 F (36.7 C)   SpO2: 98%   Weight: 175 lb 6.4 oz (79.6 kg)   PainSc:  0-No pain   Body mass index is 25.17 kg/m.   Wt Readings from Last 3 Encounters:  09/14/24 175 lb 6.4 oz (79.6 kg)  08/24/24 177 lb 8 oz (80.5 kg)  08/16/24 178 lb (80.7 kg)    Body mass index is 25.17 kg/m.  Performance status (ECOG): 1 - Symptomatic but completely ambulatory  PHYSICAL EXAM:   GENERAL:alert, no distress and comfortable SKIN: skin color, texture, turgor are normal, no rashes or significant lesions EYES: normal, Conjunctiva are pink and non-injected, sclera clear OROPHARYNX:no exudate, no erythema and lips, buccal mucosa, and tongue normal  NECK: supple, thyroid  normal size, non-tender, without nodularity LYMPH:  no palpable lymphadenopathy in the cervical, axillary or inguinal LUNGS: clear to  auscultation and percussion with normal breathing effort HEART: regular rate & rhythm and no murmurs and no lower extremity edema ABDOMEN:abdomen soft, non-tender and normal bowel sounds Musculoskeletal:no cyanosis of digits and no clubbing  NEURO: alert & oriented x 3 with fluent speech, no focal motor/sensory deficits  LABORATORY DATA:  I have reviewed the data as listed    Component Value Date/Time   NA 139 09/14/2024 0846   K 3.6 09/14/2024 0846   CL 104 09/14/2024 0846   CO2 26 09/14/2024 0846   GLUCOSE 162 (H) 09/14/2024 0846   BUN 13 09/14/2024 0846   CREATININE 0.79 09/14/2024 0846   CALCIUM  9.3 09/14/2024 0846   PROT 7.2 09/14/2024 0846   PROT 6.7 08/18/2024 0822   ALBUMIN  4.2 09/14/2024 0846   ALBUMIN  4.3 08/18/2024 0822   AST 19 09/14/2024 0846   ALT 15 09/14/2024 0846   ALKPHOS 66 09/14/2024 0846   BILITOT 0.3 09/14/2024 0846   GFRNONAA >60 09/14/2024 0846   GFRAA >60 05/11/2020 1300     Lab Results  Component Value Date   WBC 6.1 09/14/2024   NEUTROABS 4.1 09/14/2024   HGB 12.5 (L) 09/14/2024   HCT 37.0 (L) 09/14/2024   MCV 86.2 09/14/2024   PLT 167 09/14/2024   RADIOGRAPHIC STUDIES: VAS US  AORTA/IVC/ILIACS Result Date: 09/01/2024 ABDOMINAL AORTA STUDY Patient Name:  DUWARD ALLBRITTON  Date of Exam:   09/01/2024 Medical Rec #: [id number]         Accession #:    [id number] Date of Birth: 1940-09-19        Patient Gender: M Patient Age:   84 years Exam Location:  [address] Procedure:      VAS US  AORTA/IVC/ILIACS Referring Phys: JONATHAN BERRY --------------------------------------------------------------------------------  Indications: Claudication Risk Factors: Hypertension, hyperlipidemia, past history of smoking. Limitations: Air/bowel gas.  Comparison Study: N/A Performing Technologist: Dena Pane  Examination Guidelines: A complete evaluation includes B-mode imaging, spectral Doppler, color Doppler, and power  Doppler as needed of all accessible portions  of each vessel. Bilateral testing is considered an integral part of a complete examination. Limited examinations for reoccurring indications may be performed as noted.  Abdominal Aorta Findings: +-------------+-------+----------+----------+--------+--------+--------+ Location     AP (cm)Trans (cm)PSV (cm/s)WaveformThrombusComments +-------------+-------+----------+----------+--------+--------+--------+ Proximal     2.30   2.10      71                                 +-------------+-------+----------+----------+--------+--------+--------+ Mid          1.70   2.00      56                                 +-------------+-------+----------+----------+--------+--------+--------+ Distal       2.00   2.00      48                                 +-------------+-------+----------+----------+--------+--------+--------+ RT CIA Prox  1.1    1.1       120                                +-------------+-------+----------+----------+--------+--------+--------+ RT CIA Mid                    114                                +-------------+-------+----------+----------+--------+--------+--------+ RT CIA Distal                 123                                +-------------+-------+----------+----------+--------+--------+--------+ RT EIA Prox  1.2    1.1       70                                 +-------------+-------+----------+----------+--------+--------+--------+ RT EIA Mid                    211                                +-------------+-------+----------+----------+--------+--------+--------+ RT EIA Distal                 114                                +-------------+-------+----------+----------+--------+--------+--------+ LT CIA Prox  1.3    1.4       89                                 +-------------+-------+----------+----------+--------+--------+--------+ LT CIA Mid                    97                                  +-------------+-------+----------+----------+--------+--------+--------+  LT CIA Distal                 123                                +-------------+-------+----------+----------+--------+--------+--------+ LT EIA Prox  1.0    0.9       97                                 +-------------+-------+----------+----------+--------+--------+--------+ LT EIA Mid                    77                                 +-------------+-------+----------+----------+--------+--------+--------+ LT EIA Distal                 419                                +-------------+-------+----------+----------+--------+--------+--------+  Summary: Abdominal Aorta: No evidence of an abdominal aortic aneurysm was visualized. The largest aortic measurement is 2.3 cm. Stenosis: +--------------------+-------------+ Location            Stenosis      +--------------------+-------------+ Right External Iliac>50% stenosis +--------------------+-------------+ Left External Iliac >50% stenosis +--------------------+-------------+  IVC/Iliac: There is no evidence of thrombus involving the IVC.  *See table(s) above for measurements and observations. Suggest follow up LEA arterial duplex on the left leg. Suggest Peripheral Vascular Consult.  Electronically signed by Evalene Lunger MD on 09/01/2024 at 5:41:18 PM.    Final    VAS US  ABI WITH/WO TBI Result Date: 09/01/2024  LOWER EXTREMITY DOPPLER STUDY Patient Name:  PIERSON VANTOL  Date of Exam:   09/01/2024 Medical Rec #: [id number]         Accession #:    [id number] Date of Birth: 09-10-1940        Patient Gender: M Patient Age:   60 years Exam Location:  [address] Procedure:      VAS US  ABI WITH/WO TBI Referring Phys: JONATHAN BERRY --------------------------------------------------------------------------------  Indications: Claudication. High Risk Factors: Hypertension, hyperlipidemia, past history of smoking.  Comparison Study: 07/23/24                    right: 0.72                   left: 0.90 Performing Technologist: Dena Pane  Examination Guidelines: A complete evaluation includes at minimum, Doppler waveform signals and systolic blood pressure reading at the level of bilateral brachial, anterior tibial, and posterior tibial arteries, when vessel segments are accessible. Bilateral testing is considered an integral part of a complete examination. Photoelectric Plethysmograph (PPG) waveforms and toe systolic pressure readings are included as required and additional duplex testing as needed. Limited examinations for reoccurring indications may be performed as noted.  ABI Findings: +---------+------------------+-----+-----------+--------+ Right    Rt Pressure (mmHg)IndexWaveform   Comment  +---------+------------------+-----+-----------+--------+ Brachial 158                    triphasic           +---------+------------------+-----+-----------+--------+ PTA      95  0.60 multiphasic         +---------+------------------+-----+-----------+--------+ DP       87                0.55 multiphasic         +---------+------------------+-----+-----------+--------+ Great Toe64                0.41                     +---------+------------------+-----+-----------+--------+ +---------+------------------+-----+-----------+-------+ Left     Lt Pressure (mmHg)IndexWaveform   Comment +---------+------------------+-----+-----------+-------+ Brachial 152                    triphasic          +---------+------------------+-----+-----------+-------+ PTA      83                0.53 multiphasic        +---------+------------------+-----+-----------+-------+ DP       85                0.54 multiphasic        +---------+------------------+-----+-----------+-------+ Great Toe50                0.32                    +---------+------------------+-----+-----------+-------+  +-------+-----------+-----------+------------+------------+ ABI/TBIToday's ABIToday's TBIPrevious ABIPrevious TBI +-------+-----------+-----------+------------+------------+ Right  0.60       0.41       0.72        0.47         +-------+-----------+-----------+------------+------------+ Left   0.54       0.32       0.90        0.53         +-------+-----------+-----------+------------+------------+  Right ABIs appear essentially unchanged. Left ABIs appear decreased.  Summary: Right: Resting right ankle-brachial index indicates moderate right lower extremity arterial disease. Right toe pressure is >60 mmHg which suggests adequate perfusion for healing.  Left: Resting left ankle-brachial index indicates moderate left lower extremity arterial disease. The left toe-brachial index is abnormal.  *See table(s) above for measurements and observations.  Suggest follow up LEA arterial duplex on the left leg. Suggest Peripheral Vascular Consult. Electronically signed by Timothy Gollan MD on 09/01/2024 at 5:40:56 PM.    Final    "

## 2024-09-14 ENCOUNTER — Inpatient Hospital Stay: Attending: Hematology

## 2024-09-14 ENCOUNTER — Encounter: Payer: Self-pay | Admitting: Nurse Practitioner

## 2024-09-14 ENCOUNTER — Inpatient Hospital Stay

## 2024-09-14 ENCOUNTER — Inpatient Hospital Stay: Attending: Hematology | Admitting: Nurse Practitioner

## 2024-09-14 VITALS — BP 126/57 | HR 66 | Temp 98.1°F | Resp 17 | Wt 175.4 lb

## 2024-09-14 VITALS — BP 139/53 | HR 61 | Resp 18

## 2024-09-14 DIAGNOSIS — C670 Malignant neoplasm of trigone of bladder: Secondary | ICD-10-CM

## 2024-09-14 DIAGNOSIS — C671 Malignant neoplasm of dome of bladder: Secondary | ICD-10-CM

## 2024-09-14 LAB — CMP (CANCER CENTER ONLY)
ALT: 15 U/L (ref 0–44)
AST: 19 U/L (ref 15–41)
Albumin: 4.2 g/dL (ref 3.5–5.0)
Alkaline Phosphatase: 66 U/L (ref 38–126)
Anion gap: 9 (ref 5–15)
BUN: 13 mg/dL (ref 8–23)
CO2: 26 mmol/L (ref 22–32)
Calcium: 9.3 mg/dL (ref 8.9–10.3)
Chloride: 104 mmol/L (ref 98–111)
Creatinine: 0.79 mg/dL (ref 0.61–1.24)
GFR, Estimated: >60 mL/min
Glucose, Bld: 162 mg/dL — ABNORMAL HIGH (ref 70–99)
Potassium: 3.6 mmol/L (ref 3.5–5.1)
Sodium: 139 mmol/L (ref 135–145)
Total Bilirubin: 0.3 mg/dL (ref 0.0–1.2)
Total Protein: 7.2 g/dL (ref 6.5–8.1)

## 2024-09-14 LAB — CBC WITH DIFFERENTIAL (CANCER CENTER ONLY)
Abs Immature Granulocytes: 0.03 10*3/uL (ref 0.00–0.07)
Basophils Absolute: 0.1 10*3/uL (ref 0.0–0.1)
Basophils Relative: 1 %
Eosinophils Absolute: 0.3 10*3/uL (ref 0.0–0.5)
Eosinophils Relative: 4 %
HCT: 37 % — ABNORMAL LOW (ref 39.0–52.0)
Hemoglobin: 12.5 g/dL — ABNORMAL LOW (ref 13.0–17.0)
Immature Granulocytes: 1 %
Lymphocytes Relative: 18 %
Lymphs Abs: 1.1 10*3/uL (ref 0.7–4.0)
MCH: 29.1 pg (ref 26.0–34.0)
MCHC: 33.8 g/dL (ref 30.0–36.0)
MCV: 86.2 fL (ref 80.0–100.0)
Monocytes Absolute: 0.6 10*3/uL (ref 0.1–1.0)
Monocytes Relative: 9 %
Neutro Abs: 4.1 10*3/uL (ref 1.7–7.7)
Neutrophils Relative %: 67 %
Platelet Count: 167 10*3/uL (ref 150–400)
RBC: 4.29 MIL/uL (ref 4.22–5.81)
RDW: 13.2 % (ref 11.5–15.5)
WBC Count: 6.1 10*3/uL (ref 4.0–10.5)
nRBC: 0 % (ref 0.0–0.2)

## 2024-09-14 LAB — TSH: TSH: 1.74 u[IU]/mL (ref 0.350–4.500)

## 2024-09-14 MED ORDER — SODIUM CHLORIDE 0.9 % IV SOLN
200.0000 mg | Freq: Once | INTRAVENOUS | Status: AC
  Administered 2024-09-14: 200 mg via INTRAVENOUS
  Filled 2024-09-14: qty 200

## 2024-09-14 MED ORDER — SODIUM CHLORIDE 0.9 % IV SOLN: Freq: Once | INTRAVENOUS | Status: AC

## 2024-09-14 NOTE — Patient Instructions (Signed)
 [facility] - A DEPT OF [facility]  Discharge Instructions: Thank you for choosing [facility] to provide your oncology and hematology care.   If you have a lab appointment with the Cancer Center, please go directly to the Cancer Center and check in at the registration area.   Wear comfortable clothing and clothing appropriate for easy access to any Portacath or PICC line.   We strive to give you quality time with your provider. You may need to reschedule your appointment if you arrive late (15 or more minutes).  Arriving late affects you and other patients whose appointments are after yours.  Also, if you miss three or more appointments without notifying the office, you may be dismissed from the clinic at the provider's discretion.      For prescription refill requests, have your pharmacy contact our office and allow 72 hours for refills to be completed.    Today you received the following chemotherapy and/or immunotherapy agents keytruda       To help prevent nausea and vomiting after your treatment, we encourage you to take your nausea medication as directed.  BELOW ARE SYMPTOMS THAT SHOULD BE REPORTED IMMEDIATELY: *FEVER GREATER THAN 100.4 F (38 C) OR HIGHER *CHILLS OR SWEATING *NAUSEA AND VOMITING THAT IS NOT CONTROLLED WITH YOUR NAUSEA MEDICATION *UNUSUAL SHORTNESS OF BREATH *UNUSUAL BRUISING OR BLEEDING *URINARY PROBLEMS (pain or burning when urinating, or frequent urination) *BOWEL PROBLEMS (unusual diarrhea, constipation, pain near the anus) TENDERNESS IN MOUTH AND THROAT WITH OR WITHOUT PRESENCE OF ULCERS (sore throat, sores in mouth, or a toothache) UNUSUAL RASH, SWELLING OR PAIN  UNUSUAL VAGINAL DISCHARGE OR ITCHING   Items with * indicate a potential emergency and should be followed up as soon as possible or go to the Emergency Department if any problems should occur.  Please show the CHEMOTHERAPY ALERT CARD or IMMUNOTHERAPY  ALERT CARD at check-in to the Emergency Department and triage nurse.  Should you have questions after your visit or need to cancel or reschedule your appointment, please contact [facility] - A DEPT OF [facility]  Dept: [phone]  and follow the prompts.  Office hours are 8:00 a.m. to 4:30 p.m. Monday - Friday. Please note that voicemails left after 4:00 p.m. may not be returned until the following business day.  We are closed weekends and major holidays. You have access to a nurse at all times for urgent questions. Please call the main number to the clinic Dept: [phone] and follow the prompts.   For any non-urgent questions, you may also contact your provider using MyChart. We now offer e-Visits for anyone 47 and older to request care online for non-urgent symptoms. For details visit [email or web address].   Also download the MyChart app! Go to the app store, search MyChart, open the app, select [place], and log in with your MyChart username and password.

## 2024-09-15 LAB — T4: T4, Total: 9.4 ug/dL (ref 4.5–12.0)

## 2024-09-23 ENCOUNTER — Ambulatory Visit: Admitting: Primary Care

## 2024-10-05 ENCOUNTER — Inpatient Hospital Stay

## 2024-10-05 ENCOUNTER — Inpatient Hospital Stay: Admitting: Hematology

## 2024-10-11 ENCOUNTER — Ambulatory Visit: Admitting: Cardiovascular Disease

## 2024-10-26 ENCOUNTER — Inpatient Hospital Stay

## 2024-10-26 ENCOUNTER — Inpatient Hospital Stay: Admitting: Hematology

## 2024-11-05 ENCOUNTER — Ambulatory Visit (HOSPITAL_COMMUNITY): Admitting: Nurse Practitioner

## 2024-11-15 ENCOUNTER — Ambulatory Visit (HOSPITAL_COMMUNITY)
